# Patient Record
Sex: Female | Born: 1950 | Race: Black or African American | Hispanic: No | Marital: Married | Smoking: Former smoker
Health system: Southern US, Community
[De-identification: ages and names within clinical notes are randomized; demographics above are authoritative.]

## PROBLEM LIST (undated history)

## (undated) DIAGNOSIS — E7800 Pure hypercholesterolemia, unspecified: Secondary | ICD-10-CM

## (undated) DIAGNOSIS — K219 Gastro-esophageal reflux disease without esophagitis: Secondary | ICD-10-CM

## (undated) DIAGNOSIS — F32A Depression, unspecified: Secondary | ICD-10-CM

## (undated) DIAGNOSIS — I639 Cerebral infarction, unspecified: Secondary | ICD-10-CM

## (undated) DIAGNOSIS — D3501 Benign neoplasm of right adrenal gland: Secondary | ICD-10-CM

## (undated) DIAGNOSIS — M8700 Idiopathic aseptic necrosis of unspecified bone: Secondary | ICD-10-CM

## (undated) DIAGNOSIS — M1990 Unspecified osteoarthritis, unspecified site: Secondary | ICD-10-CM

## (undated) DIAGNOSIS — Z8669 Personal history of other diseases of the nervous system and sense organs: Secondary | ICD-10-CM

## (undated) DIAGNOSIS — G43909 Migraine, unspecified, not intractable, without status migrainosus: Secondary | ICD-10-CM

## (undated) DIAGNOSIS — F0390 Unspecified dementia without behavioral disturbance: Secondary | ICD-10-CM

## (undated) DIAGNOSIS — I10 Essential (primary) hypertension: Secondary | ICD-10-CM

## (undated) DIAGNOSIS — H43393 Other vitreous opacities, bilateral: Secondary | ICD-10-CM

## (undated) DIAGNOSIS — F329 Major depressive disorder, single episode, unspecified: Secondary | ICD-10-CM

## (undated) DIAGNOSIS — F419 Anxiety disorder, unspecified: Secondary | ICD-10-CM

## (undated) DIAGNOSIS — R569 Unspecified convulsions: Secondary | ICD-10-CM

## (undated) HISTORY — PX: COLONOSCOPY: SHX174

## (undated) HISTORY — PX: ROTATOR CUFF REPAIR: SHX139

## (undated) HISTORY — PX: TONSILLECTOMY: SUR1361

## (undated) HISTORY — PX: PARTIAL HYSTERECTOMY: SHX80

---
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
## 1998-11-11 ENCOUNTER — Encounter: Payer: Self-pay | Admitting: Neurosurgery

## 1998-11-11 ENCOUNTER — Ambulatory Visit (HOSPITAL_COMMUNITY): Admission: RE | Admit: 1998-11-11 | Discharge: 1998-11-11 | Payer: Self-pay | Admitting: Neurosurgery

## 1999-05-05 ENCOUNTER — Other Ambulatory Visit: Admission: RE | Admit: 1999-05-05 | Discharge: 1999-05-05 | Payer: Self-pay | Admitting: Obstetrics and Gynecology

## 1999-05-21 ENCOUNTER — Encounter: Payer: Self-pay | Admitting: Neurosurgery

## 1999-05-21 ENCOUNTER — Encounter: Admission: RE | Admit: 1999-05-21 | Discharge: 1999-05-21 | Payer: Self-pay | Admitting: Neurosurgery

## 1999-06-06 ENCOUNTER — Encounter: Payer: Self-pay | Admitting: Neurosurgery

## 1999-06-06 ENCOUNTER — Ambulatory Visit (HOSPITAL_COMMUNITY): Admission: RE | Admit: 1999-06-06 | Discharge: 1999-06-06 | Payer: Self-pay | Admitting: Neurosurgery

## 1999-06-23 ENCOUNTER — Ambulatory Visit (HOSPITAL_BASED_OUTPATIENT_CLINIC_OR_DEPARTMENT_OTHER): Admission: RE | Admit: 1999-06-23 | Discharge: 1999-06-23 | Payer: Self-pay | Admitting: Orthopedic Surgery

## 2001-02-07 ENCOUNTER — Emergency Department (HOSPITAL_COMMUNITY): Admission: EM | Admit: 2001-02-07 | Discharge: 2001-02-08 | Payer: Self-pay | Admitting: Emergency Medicine

## 2001-02-07 ENCOUNTER — Encounter: Payer: Self-pay | Admitting: Emergency Medicine

## 2001-04-18 ENCOUNTER — Other Ambulatory Visit: Admission: RE | Admit: 2001-04-18 | Discharge: 2001-04-18 | Payer: Self-pay | Admitting: Obstetrics and Gynecology

## 2001-09-11 ENCOUNTER — Other Ambulatory Visit: Admission: RE | Admit: 2001-09-11 | Discharge: 2001-09-11 | Payer: Self-pay | Admitting: Diagnostic Radiology

## 2003-03-06 ENCOUNTER — Encounter: Admission: RE | Admit: 2003-03-06 | Discharge: 2003-03-06 | Payer: Self-pay | Admitting: General Surgery

## 2003-03-06 ENCOUNTER — Encounter (INDEPENDENT_AMBULATORY_CARE_PROVIDER_SITE_OTHER): Payer: Self-pay | Admitting: *Deleted

## 2003-03-24 ENCOUNTER — Encounter: Admission: RE | Admit: 2003-03-24 | Discharge: 2003-03-24 | Payer: Self-pay | Admitting: General Surgery

## 2003-03-24 ENCOUNTER — Ambulatory Visit (HOSPITAL_COMMUNITY): Admission: RE | Admit: 2003-03-24 | Discharge: 2003-03-24 | Payer: Self-pay | Admitting: General Surgery

## 2003-03-24 ENCOUNTER — Encounter (INDEPENDENT_AMBULATORY_CARE_PROVIDER_SITE_OTHER): Payer: Self-pay | Admitting: *Deleted

## 2003-03-24 ENCOUNTER — Ambulatory Visit (HOSPITAL_BASED_OUTPATIENT_CLINIC_OR_DEPARTMENT_OTHER): Admission: RE | Admit: 2003-03-24 | Discharge: 2003-03-24 | Payer: Self-pay | Admitting: General Surgery

## 2004-02-24 ENCOUNTER — Other Ambulatory Visit: Admission: RE | Admit: 2004-02-24 | Discharge: 2004-02-24 | Payer: Self-pay | Admitting: Obstetrics and Gynecology

## 2004-02-24 ENCOUNTER — Ambulatory Visit (HOSPITAL_COMMUNITY): Admission: RE | Admit: 2004-02-24 | Discharge: 2004-02-24 | Payer: Self-pay | Admitting: Family Medicine

## 2008-01-22 ENCOUNTER — Encounter: Admission: RE | Admit: 2008-01-22 | Discharge: 2008-01-22 | Payer: Self-pay | Admitting: Neurosurgery

## 2008-03-14 ENCOUNTER — Encounter: Admission: RE | Admit: 2008-03-14 | Discharge: 2008-03-14 | Payer: Self-pay | Admitting: Family Medicine

## 2010-02-13 ENCOUNTER — Encounter: Payer: Self-pay | Admitting: Family Medicine

## 2010-06-11 NOTE — Op Note (Signed)
NAME:  Judy Chang, Judy Chang                       ACCOUNT NO.:  [id number]   MEDICAL RECORD NO.:  [id number]                   PATIENT TYPE:  AMB   LOCATION:  [place]                                  FACILITY:  [facility]   PHYSICIAN:  Rudell Cobb. Annamaria Boots, M.D.                DATE OF BIRTH:  12-28-1950   DATE OF PROCEDURE:  03/24/2003  DATE OF DISCHARGE:                                 OPERATIVE REPORT   PREOPERATIVE DIAGNOSIS:  Right breast mass.   POSTOPERATIVE DIAGNOSIS:  Right breast mass.   OPERATION:  Excision of right breast mass with needle localization and  specimen mammography.   SURGEON:  Rudell Cobb. Annamaria Boots, M.D.   ANESTHESIA:  MAC.   DESCRIPTION OF PROCEDURE:  The patient was placed on the operating room  table and the right breast was prepped and draped in the usual fashion.  Using the previously-placed wire in the 12 o'clock position of the right  breast where the lesion was, a curved incision was outlined, and then the  area infiltrated with local anesthetic.  An incision was made, and we  exposed the wire and then excised the wire and the surrounding tissue.  The  nodule was actually palpable there, and a specimen mammography confirmed the  removal of it.  Hemostasis was obtained with the cautery.  The subcuticular  closure of #4-0 Monocryl and Steri-Strips was carried out.  A dressing was  applied.  The patient was transferred to the recovery room in satisfactory condition,  having tolerated the procedure well.                                               Rudell Cobb. Annamaria Boots, M.D.    PRY/MEDQ  D:  03/24/2003  T:  03/24/2003  Job:  [id number]

## 2010-06-11 NOTE — Op Note (Signed)
. [facility]  Patient:    Judy Chang, Judy Chang                    MRN: [id number] Proc. Date: 06/23/99 Adm. Date:  [id number] Disc. 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  PROCEDURE: 1. Arthroscopic acromioplasty with rotator cuff debridement. 2. Intra-articular debridement of torn labrum. 3. Arthroscopic excision of distal clavicle.  SURGEON:  Josephine Cables., M.D.  ANESTHESIA:  General.  INDICATIONS:  This is a 60 year old with a MRI suggestion of severe partial versus complete right rotator cuff with impingement and AC arthritis felt amenable to outpatient arthroscopy and possible overnight observation.  DESCRIPTION OF PROCEDURE:  Normal range of motion.  No instability. Arthroscope through a posterolateral anterior portal.  Intra-articular inspection showed the rotator cuff to have about a 30-40% tear over about 2 cm, incomplete, requiring debridement.  Degenerative tearing of the labrum which was debrided and with no instability, and some mild softening of the articular surfaces, but no frank arthritic change.  Debridement was carried out intra-articularly followed by subacromial decompression with bursectomy carried out through a posterior approach.  Moderate impingement both from a degenerative AC joint as well as from a prominent acromion.  Probably 8-9 mm of distal clavicle was resected, and probably from 6-7 cm along the anterior leading edge of the acromion.  Superior surface of the cuff showed no distinct tearing.  The shoulder was drained free of fluid.  Portals and  joint infiltrated with Marcaine with closure with interrupted nylon.  A lightly compressive sterile dressing applied.  Taken to the recovery room in stable condition. DD:  06/23/99 TD:  06/27/99 Job: [id number]

## 2011-01-03 ENCOUNTER — Other Ambulatory Visit: Payer: Self-pay | Admitting: Orthopedic Surgery

## 2011-01-03 DIAGNOSIS — M545 Low back pain, unspecified: Secondary | ICD-10-CM

## 2011-01-10 ENCOUNTER — Ambulatory Visit
Admission: RE | Admit: 2011-01-10 | Discharge: 2011-01-10 | Disposition: A | Discharge status: Home / Self Care | Payer: 59 | Source: Ambulatory Visit | Attending: Orthopedic Surgery | Admitting: Orthopedic Surgery

## 2011-01-10 DIAGNOSIS — M5136 Other intervertebral disc degeneration, lumbar region: Secondary | ICD-10-CM

## 2011-01-10 DIAGNOSIS — M545 Low back pain: Secondary | ICD-10-CM

## 2011-01-10 DIAGNOSIS — M47816 Spondylosis without myelopathy or radiculopathy, lumbar region: Secondary | ICD-10-CM

## 2011-01-10 DIAGNOSIS — M51369 Other intervertebral disc degeneration, lumbar region without mention of lumbar back pain or lower extremity pain: Secondary | ICD-10-CM

## 2011-01-10 HISTORY — DX: Spondylosis without myelopathy or radiculopathy, lumbar region: M47.816

## 2011-01-10 HISTORY — DX: Other intervertebral disc degeneration, lumbar region: M51.36

## 2011-01-10 HISTORY — DX: Other intervertebral disc degeneration, lumbar region without mention of lumbar back pain or lower extremity pain: M51.369

## 2013-10-01 DIAGNOSIS — M48061 Spinal stenosis, lumbar region without neurogenic claudication: Secondary | ICD-10-CM | POA: Diagnosis not present

## 2013-11-05 DIAGNOSIS — M5440 Lumbago with sciatica, unspecified side: Secondary | ICD-10-CM | POA: Diagnosis not present

## 2013-11-11 DIAGNOSIS — F329 Major depressive disorder, single episode, unspecified: Secondary | ICD-10-CM | POA: Diagnosis not present

## 2013-11-11 DIAGNOSIS — N183 Chronic kidney disease, stage 3 (moderate): Secondary | ICD-10-CM | POA: Diagnosis not present

## 2013-11-11 DIAGNOSIS — M542 Cervicalgia: Secondary | ICD-10-CM | POA: Diagnosis not present

## 2013-11-11 DIAGNOSIS — Z23 Encounter for immunization: Secondary | ICD-10-CM | POA: Diagnosis not present

## 2013-11-11 DIAGNOSIS — G5710 Meralgia paresthetica, unspecified lower limb: Secondary | ICD-10-CM | POA: Diagnosis not present

## 2013-11-11 DIAGNOSIS — M5126 Other intervertebral disc displacement, lumbar region: Secondary | ICD-10-CM | POA: Diagnosis not present

## 2013-11-11 DIAGNOSIS — R51 Headache: Secondary | ICD-10-CM | POA: Diagnosis not present

## 2013-11-11 DIAGNOSIS — I10 Essential (primary) hypertension: Secondary | ICD-10-CM | POA: Diagnosis not present

## 2013-11-11 DIAGNOSIS — G4700 Insomnia, unspecified: Secondary | ICD-10-CM | POA: Diagnosis not present

## 2013-11-18 DIAGNOSIS — Z1231 Encounter for screening mammogram for malignant neoplasm of breast: Secondary | ICD-10-CM | POA: Diagnosis not present

## 2013-11-25 DIAGNOSIS — M5440 Lumbago with sciatica, unspecified side: Secondary | ICD-10-CM | POA: Diagnosis not present

## 2013-11-25 DIAGNOSIS — M545 Low back pain: Secondary | ICD-10-CM | POA: Diagnosis not present

## 2014-05-13 DIAGNOSIS — F329 Major depressive disorder, single episode, unspecified: Secondary | ICD-10-CM | POA: Diagnosis not present

## 2014-05-13 DIAGNOSIS — M5126 Other intervertebral disc displacement, lumbar region: Secondary | ICD-10-CM | POA: Diagnosis not present

## 2014-05-13 DIAGNOSIS — H10403 Unspecified chronic conjunctivitis, bilateral: Secondary | ICD-10-CM | POA: Diagnosis not present

## 2014-05-13 DIAGNOSIS — N183 Chronic kidney disease, stage 3 (moderate): Secondary | ICD-10-CM | POA: Diagnosis not present

## 2014-05-13 DIAGNOSIS — G4700 Insomnia, unspecified: Secondary | ICD-10-CM | POA: Diagnosis not present

## 2014-05-13 DIAGNOSIS — I10 Essential (primary) hypertension: Secondary | ICD-10-CM | POA: Diagnosis not present

## 2014-06-02 DIAGNOSIS — R531 Weakness: Secondary | ICD-10-CM | POA: Diagnosis not present

## 2014-11-25 ENCOUNTER — Emergency Department (HOSPITAL_COMMUNITY)
Admission: EM | Admit: 2014-11-25 | Discharge: 2014-11-25 | Disposition: A | Discharge status: Home / Self Care | Payer: Medicare Other | Attending: Emergency Medicine | Admitting: Emergency Medicine

## 2014-11-25 ENCOUNTER — Emergency Department (HOSPITAL_COMMUNITY): Payer: Medicare Other

## 2014-11-25 ENCOUNTER — Encounter (HOSPITAL_COMMUNITY): Payer: Self-pay | Admitting: Emergency Medicine

## 2014-11-25 DIAGNOSIS — Z79899 Other long term (current) drug therapy: Secondary | ICD-10-CM | POA: Diagnosis not present

## 2014-11-25 DIAGNOSIS — R531 Weakness: Secondary | ICD-10-CM | POA: Insufficient documentation

## 2014-11-25 DIAGNOSIS — Z8639 Personal history of other endocrine, nutritional and metabolic disease: Secondary | ICD-10-CM | POA: Diagnosis not present

## 2014-11-25 DIAGNOSIS — R112 Nausea with vomiting, unspecified: Secondary | ICD-10-CM | POA: Insufficient documentation

## 2014-11-25 DIAGNOSIS — Z720 Tobacco use: Secondary | ICD-10-CM | POA: Insufficient documentation

## 2014-11-25 DIAGNOSIS — R040 Epistaxis: Secondary | ICD-10-CM | POA: Insufficient documentation

## 2014-11-25 HISTORY — DX: Pure hypercholesterolemia, unspecified: E78.00

## 2014-11-25 LAB — BASIC METABOLIC PANEL
Anion gap: 10 (ref 5–15)
BUN: 18 mg/dL (ref 6–20)
CALCIUM: 10.5 mg/dL — AB (ref 8.9–10.3)
CO2: 22 mmol/L (ref 22–32)
CREATININE: 1.54 mg/dL — AB (ref 0.44–1.00)
Chloride: 95 mmol/L — ABNORMAL LOW (ref 101–111)
GFR calc Af Amer: 39 mL/min — ABNORMAL LOW (ref >60)
GFR, EST NON AFRICAN AMERICAN: 34 mL/min — AB (ref >60)
GLUCOSE: 93 mg/dL (ref 65–99)
POTASSIUM: 4.9 mmol/L (ref 3.5–5.1)
SODIUM: 127 mmol/L — AB (ref 135–145)

## 2014-11-25 LAB — TROPONIN I: Troponin I: <0.03 ng/mL (ref <0.031)

## 2014-11-25 LAB — CBC WITH DIFFERENTIAL/PLATELET
BASOS PCT: 1 %
Basophils Absolute: 0.1 10*3/uL (ref 0.0–0.1)
Eosinophils Absolute: 0.1 10*3/uL (ref 0.0–0.7)
Eosinophils Relative: 2 %
HEMATOCRIT: 44.7 % (ref 36.0–46.0)
HEMOGLOBIN: 15.3 g/dL — AB (ref 12.0–15.0)
LYMPHS ABS: 2.1 10*3/uL (ref 0.7–4.0)
Lymphocytes Relative: 26 %
MCH: 29.4 pg (ref 26.0–34.0)
MCHC: 34.2 g/dL (ref 30.0–36.0)
MCV: 86 fL (ref 78.0–100.0)
MONOS PCT: 10 %
Monocytes Absolute: 0.8 10*3/uL (ref 0.1–1.0)
NEUTROS ABS: 4.8 10*3/uL (ref 1.7–7.7)
NEUTROS PCT: 61 %
PLATELETS: 308 10*3/uL (ref 150–400)
RBC: 5.2 MIL/uL — ABNORMAL HIGH (ref 3.87–5.11)
RDW: 14.9 % (ref 11.5–15.5)
WBC: 7.9 10*3/uL (ref 4.0–10.5)

## 2014-11-25 LAB — HEPATIC FUNCTION PANEL
ALK PHOS: 62 U/L (ref 38–126)
ALT: 18 U/L (ref 14–54)
AST: 34 U/L (ref 15–41)
Albumin: 4.7 g/dL (ref 3.5–5.0)
BILIRUBIN DIRECT: 0.3 mg/dL (ref 0.1–0.5)
BILIRUBIN INDIRECT: 0.6 mg/dL (ref 0.3–0.9)
BILIRUBIN TOTAL: 0.9 mg/dL (ref 0.3–1.2)
TOTAL PROTEIN: 8.7 g/dL — AB (ref 6.5–8.1)

## 2014-11-25 LAB — URINALYSIS, ROUTINE W REFLEX MICROSCOPIC
BILIRUBIN URINE: NEGATIVE
GLUCOSE, UA: NEGATIVE mg/dL
HGB URINE DIPSTICK: NEGATIVE
KETONES UR: NEGATIVE mg/dL
LEUKOCYTES UA: NEGATIVE
Nitrite: NEGATIVE
PH: 5.5 (ref 5.0–8.0)
PROTEIN: NEGATIVE mg/dL
Specific Gravity, Urine: 1.005 (ref 1.005–1.030)
Urobilinogen, UA: 0.2 mg/dL (ref 0.0–1.0)

## 2014-11-25 LAB — LIPASE, BLOOD: LIPASE: 59 U/L — AB (ref 11–51)

## 2014-11-25 LAB — D-DIMER, QUANTITATIVE: D-Dimer, Quant: 0.57 ug/mL-FEU — ABNORMAL HIGH (ref 0.00–0.48)

## 2014-11-25 MED ORDER — SODIUM CHLORIDE 0.9 % IV BOLUS (SEPSIS)
1000.0000 mL | Freq: Once | INTRAVENOUS | Status: AC
  Administered 2014-11-25: 1000 mL via INTRAVENOUS

## 2014-11-25 NOTE — Progress Notes (Signed)
Patient confirms pcp is Dr. Azalia Bilis of [facility].

## 2014-11-25 NOTE — Discharge Instructions (Signed)
1. Medications: usual home medications 2. Treatment: rest, drink plenty of fluids 3. Follow Up: please followup with your primary doctor in 2-3 days for discussion of your diagnoses and further evaluation after today's visit; if you do not have a primary care doctor use the resource guide provided to find one; please return to the ER for chest pain, shortness of breath, persistent vomiting, recurring nosebleed, new or worsening symptoms   Nausea and Vomiting Nausea is a sick feeling that often comes before throwing up (vomiting). Vomiting is a reflex where stomach contents come out of your mouth. Vomiting can cause severe loss of body fluids (dehydration). Children and elderly adults can become dehydrated quickly, especially if they also have diarrhea. Nausea and vomiting are symptoms of a condition or disease. It is important to find the cause of your symptoms. CAUSES   Direct irritation of the stomach lining. This irritation can result from increased acid production (gastroesophageal reflux disease), infection, food poisoning, taking certain medicines (such as nonsteroidal anti-inflammatory drugs), alcohol use, or tobacco use.  Signals from the brain.These signals could be caused by a headache, heat exposure, an inner ear disturbance, increased pressure in the brain from injury, infection, a tumor, or a concussion, pain, emotional stimulus, or metabolic problems.  An obstruction in the gastrointestinal tract (bowel obstruction).  Illnesses such as diabetes, hepatitis, gallbladder problems, appendicitis, kidney problems, cancer, sepsis, atypical symptoms of a heart attack, or eating disorders.  Medical treatments such as chemotherapy and radiation.  Receiving medicine that makes you sleep (general anesthetic) during surgery. DIAGNOSIS Your caregiver may ask for tests to be done if the problems do not improve after a few days. Tests may also be done if symptoms are severe or if the reason for  the nausea and vomiting is not clear. Tests may include:  Urine tests.  Blood tests.  Stool tests.  Cultures (to look for evidence of infection).  X-rays or other imaging studies. Test results can help your caregiver make decisions about treatment or the need for additional tests. TREATMENT You need to stay well hydrated. Drink frequently but in small amounts.You may wish to drink water, sports drinks, clear broth, or eat frozen ice pops or gelatin dessert to help stay hydrated.When you eat, eating slowly may help prevent nausea.There are also some antinausea medicines that may help prevent nausea. HOME CARE INSTRUCTIONS   Take all medicine as directed by your caregiver.  If you do not have an appetite, do not force yourself to eat. However, you must continue to drink fluids.  If you have an appetite, eat a normal diet unless your caregiver tells you differently.  Eat a variety of complex carbohydrates (rice, wheat, potatoes, bread), lean meats, yogurt, fruits, and vegetables.  Avoid high-fat foods because they are more difficult to digest.  Drink enough water and fluids to keep your urine clear or pale yellow.  If you are dehydrated, ask your caregiver for specific rehydration instructions. Signs of dehydration may include:  Severe thirst.  Dry lips and mouth.  Dizziness.  Dark urine.  Decreasing urine frequency and amount.  Confusion.  Rapid breathing or pulse. SEEK IMMEDIATE MEDICAL CARE IF:   You have blood or brown flecks (like coffee grounds) in your vomit.  You have black or bloody stools.  You have a severe headache or stiff neck.  You are confused.  You have severe abdominal pain.  You have chest pain or trouble breathing.  You do not urinate at least once every 8  hours.  You develop cold or clammy skin.  You continue to vomit for longer than 24 to 48 hours.  You have a fever. MAKE SURE YOU:   Understand these instructions.  Will watch  your condition.  Will get help right away if you are not doing well or get worse.   This information is not intended to replace advice given to you by your health care provider. Make sure you discuss any questions you have with your health care provider.   Document Released: 01/10/2005 Document Revised: 04/04/2011 Document Reviewed: 06/09/2010 Elsevier Interactive Patient Education 2016 [place] are common. They are due to a crack in the inside lining of your nose (mucous membrane) or from a small blood vessel that starts to bleed. Nosebleeds can be caused by many conditions, such as injury, infections, dry mucous membranes or dry climate, medicines, nose picking, and home heating and cooling systems. Most nosebleeds come from blood vessels in the front of your nose. HOME CARE INSTRUCTIONS   Try controlling your nosebleed by pinching your nostrils gently and continuously for at least 10 minutes.  Avoid blowing or sniffing your nose for a number of hours after having a nosebleed.  Do not put gauze inside your nose yourself. If your nose was packed by your health care provider, try to maintain the pack inside of your nose until your health care provider removes it.  If a gauze pack was used and it starts to fall out, gently replace it or cut off the end of it.  If a balloon catheter was used to pack your nose, do not cut or remove it unless your health care provider has instructed you to do that.  Avoid lying down while you are having a nosebleed. Sit up and lean forward.  Use a nasal spray decongestant to help with a nosebleed as directed by your health care provider.  Do not use petroleum jelly or mineral oil in your nose. These can drip into your lungs.  Maintain humidity in your home by using less air conditioning or by using a humidifier.  Aspirinand blood thinners make bleeding more likely. If you are prescribed these medicines and you suffer from  nosebleeds, ask your health care provider if you should stop taking the medicines or adjust the dose. Do not stop medicines unless directed by your health care provider  Resume your normal activities as you are able, but avoid straining, lifting, or bending at the waist for several days.  If your nosebleed was caused by dry mucous membranes, use over-the-counter saline nasal spray or gel. This will keep the mucous membranes moist and allow them to heal. If you must use a lubricant, choose the water-soluble variety. Use it only sparingly, and do not use it within several hours of lying down.  Keep all follow-up visits as directed by your health care provider. This is important. SEEK MEDICAL CARE IF:  You have a fever.  You get frequent nosebleeds.  You are getting nosebleeds more often. SEEK IMMEDIATE MEDICAL CARE IF:  Your nosebleed lasts longer than 20 minutes.  Your nosebleed occurs after an injury to your face, and your nose looks crooked or broken.  You have unusual bleeding from other parts of your body.  You have unusual bruising on other parts of your body.  You feel light-headed or you faint.  You become sweaty.  You vomit blood.  Your nosebleed occurs after a head injury.   This information is not  intended to replace advice given to you by your health care provider. Make sure you discuss any questions you have with your health care provider.   Document Released: 10/20/2004 Document Revised: 01/31/2014 Document Reviewed: 08/26/2013 Elsevier Interactive Patient Education 2016 [employer].   Emergency Department Resource Guide 1) Find a Doctor and Pay Out of Pocket Although you won't have to find out who is covered by your insurance plan, it is a good idea to ask around and get recommendations. You will then need to call the office and see if the doctor you have chosen will accept you as a new patient and what types of options they offer for patients who are self-pay.  Some doctors offer discounts or will set up payment plans for their patients who do not have insurance, but you will need to ask so you aren't surprised when you get to your appointment.  2) Contact Your Local Health Department Not all health departments have doctors that can see patients for sick visits, but many do, so it is worth a call to see if yours does. If you don't know where your local health department is, you can check in your phone book. The CDC also has a tool to help you locate your state's health department, and many state websites also have listings of all of their local health departments.  3) Find a [facility] If your illness is not likely to be very severe or complicated, you may want to try a walk in clinic. These are popping up all over the country in pharmacies, drugstores, and shopping centers. They're usually staffed by nurse practitioners or physician assistants that have been trained to treat common illnesses and complaints. They're usually fairly quick and inexpensive. However, if you have serious medical issues or chronic medical problems, these are probably not your best option.  No Primary Care Doctor: - Call Health Connect at  [phone] - they can help you locate a primary care doctor that  accepts your insurance, provides certain services, etc. - Physician Referral Service- [phone]  Chronic Pain Problems: Organization         Address  Phone   Notes  [facility]  [phone] Patients need to be referred by their primary care doctor.   Medication Assistance: Organization         Address  Phone   Notes  [facility] Medication [facility] [address] [place], [place] [address] [phone] --Must be a resident of [facility] -- Must have NO insurance coverage whatsoever (no Medicaid/ Medicare, etc.) -- The pt. MUST have a primary care doctor that directs their care regularly and follows them in the  community   MedAssist  [phone]   [employer]  [phone]    Agencies that provide inexpensive medical care: Organization         Address  Phone   Notes  [place]  [phone]   [facility]    [phone]   [facility] [place], [place] [address] [phone]   [place] [address] [place] [phone]   Planned Parenthood    [phone]   [facility]    [phone]   [place] [address], [place] Phone:  [phone], Fax:  [phone] Hours of Operation:  9 am - 6 pm, M-F.  Also accepts Medicaid/Medicare and self-pay.  [facility] for [place], [address], [place] Phone: [phone], Fax: [phone]. Hours of Operation:  8:30 am - 5:30 pm, M-F.  Also accepts Medicaid and self-pay.  [facility] [address], [place] Phone: [phone]   [place], [place], [place] [phone], Ext. [phone] Mondays & Thursdays: 7-9 AM.  First 15 patients are seen on a first come, first serve basis.    [place] Providers:  Organization         Address  Phone   Notes  [facility] [address], [place] [phone] Also accepts self-pay patients.  [facility] [id number] [place], [place]  [phone]   [place], [address], [place] [phone]   [facility] [address], [place] [phone]   Lucianne Lei [address], [place]   [phone] Only accepts [place] Access [place] patients after they have their name applied to their card.   Self-Pay (no insurance) in [facility]:  Organization         Address  Phone   Notes  Sickle Cell Patients, [facility].  [facility] [place] [phone]   [facility] [place] [phone]   [facility] [place]  [place], [place], [place] [phone]   [facility]/Dr. Osei-Bonsu  [address] [place] or [address], [place] [phone] Phone number for both [place] and [place] locations is the same.  Urgent Medical and [facility] [address], [place] [phone]   [facility] [address] [place] or [address] [phone]   [facility] [address], [place] [phone], phone; [phone], fax Sees patients 1st and 3rd Saturday of every month.  Must not qualify for public or private insurance (i.e. Medicaid, Medicare, [place] Health Choice, Veterans' Benefits)  Household income should be no more than 200% of the poverty level The clinic cannot treat you if you are pregnant or think you are pregnant  Sexually transmitted diseases are not treated at the clinic.    Dental Care: Organization         Address  Phone  Notes  [facility] Department of [facility] [place] [phone] Accepts children up to age 69 who are enrolled in [place] or [place]; pregnant women with a Medicaid card; and children who have applied for Medicaid or [place] Health Choice, but were declined, whose parents can pay a reduced fee at time of service.  [facility] Department of [facility]  [address], [place] [phone] Accepts children up to age 3 who are enrolled in [place] or [place]; pregnant women with a Medicaid card; and children who have applied for Medicaid or [place] Health Choice, but were declined, whose parents can pay a reduced fee at time of service.  [place] Adult Dental Access PROGRAM  [place] [phone] Patients are seen by appointment only. Walk-ins are not accepted. [place] will see patients 63 years of age and older. Monday - Tuesday (  8am-5pm) Most Wednesdays (8:30-5pm) $30 per visit, cash only  [facility] Adult Dental Access PROGRAM  [address], [facility] [phone] Patients are seen by appointment only. Walk-ins are not accepted. [place] will see patients 24 years of age and older. One Wednesday Evening (Monthly: Volunteer Based).  $30 per visit, cash only  [place]  [phone] for adults; Children under age 47, call Graduate Pediatric Dentistry at [phone]. Children aged 8-14, please call [phone] to request a pediatric application.  Dental services are provided in all areas of dental care including fillings, crowns and bridges, complete and partial dentures, implants, gum treatment, root canals, and extractions. Preventive care is also provided. Treatment is provided to both adults and children. Patients are selected via a lottery and there is often a waiting list.   [facility] [address] [place]  [phone] [email or web address]   [facility] [address] [place], [place] [phone], Ext. [phone] Second and Fourth Thursday of each month, opens at 6:30 AM; Clinic ends at 9 AM.  Patients are seen on a first-come first-served basis, and a limited number are seen during each clinic.   [facility]  [address] [place] Danker [place], [place] [phone]   Eligibility Requirements You must have lived in [place], [place], or [place] counties for at least the last three months.   You cannot be eligible for state or federal sponsored [employer], including [employer], [place], or [employer].   You generally cannot be eligible for healthcare insurance through your employer.    How to apply: Eligibility screenings are held every  Tuesday and Wednesday afternoon from 1:00 pm until 4:00 pm. You do not need an appointment for the interview!  [facility] [address] [place], [place]   [place]  [place] Department  Justice  [phone]    Behavioral Health Resources in the Community: Intensive Outpatient Programs Organization         Address  Phone  Notes  [place]. [address], [place], [place] [phone]   [facility] Outpatient [address] [place], [place]   ADS: Alcohol & Drug Svcs [address], [place], [place]   [place] [address],  [place], [place] or [phone]   Substance Abuse Resources Organization         Address  Phone  Notes  Alcohol and Drug Services  [phone]   [place]  [phone]   [place]   Chinita Pester  [phone]   Residential & Outpatient Substance Abuse Program  [phone]   Psychological Services Organization         Address  Phone  Notes  [facility]  [place]  [phone]   [place] [address] [place] or [phone]    Mobile Crisis Teams Organization         Address  Phone  Notes  [facility], Mobile Crisis Care Unit  [phone]   [facility]  [address] [place], [place]   [place] [address], [place] [phone]    Self-Help/Support Groups Organization         Address  Phone  Notes  [facility]ental Health Assoc. of [place] - variety of support groups  [place] Call for more information  Narcotics Anonymous (NA), [facility] [address], [employer] [place]  2 meetings at this location    Special educational needs teacher         Address  Phone  Notes  [facility]AP Residential Treatment [place],    [place]  [phone]   [facility]  [address], [place] [phone], [place] , [place]   [place], [place] [phone] Admissions: 8am-3pm M-F  Incentives Substance [place] [address],    [place], [place] [phone]   [facility] [address] [place], [place], [place]   [facility] [address]  [place], [place]   Insight Programs - Intensive Outpatient [address], [place], [place]   [facility] ([place].,  [place], [place] [phone] or [phone]   Residential Treatment Services (RTS) [address] [place], [place] Accepts Medicaid  [facility] [place] [address],  [place] [phone] Substance Abuse/Addiction Treatment   [facility] Organization         Address  Phone  Notes  [facility]  [phone]   Domenic Schwab, PhD [address] [place], [place]   [phone] or [phone]   [facility] [place], [place] [phone]   [facility] [address], [place], [place] [phone] Insurance/Medicaid/sponsorship through [facility] and Families [address] [place]                                    [place], [place] [phone] [place] [address], [place] [phone]    Dr. Adele Schilder  [phone]   [facility] Dept. 1) [address], [place] 2) [place] 3)  [place] [address], [place] [phone]  [phone]   [place] [phone] or [phone] (After  Hours)

## 2014-11-25 NOTE — ED Provider Notes (Signed)
CSN: [id number]     Arrival date & time 11/25/14  1420 History   First MD Initiated Contact with Patient 11/25/14 1541     Chief Complaint  Patient presents with  . Emesis    HPI   Judy Chang is a 64 y.o. female with a PMH of HLD who presents to the ED with emesis and nosebleed. She states this occurred around 1 PM this afternoon. She states she felt "sick on her stomach" and had one episode of emesis containing blood. She reports at the same time, she had a nosebleed that lasted approximately 60 seconds and spontaneously resolved. She denies exacerbating or alleviating factors. She states this has never happened to her before. She denies headache, dizziness, chest pain, shortness of breath, abdominal pain, diarrhea, constipation, numbness, paresthesia. She reports generalized weakness. She states she does not take anticoagulants.   Past Medical History  Diagnosis Date  . Hypercholesterolemia    History reviewed. No pertinent past surgical history. History reviewed. No pertinent family history. Social History  Substance Use Topics  . Smoking status: Current Every Day Smoker  . Smokeless tobacco: None  . Alcohol Use: Yes     Comment: every few days   OB History    No data available      Review of Systems  Constitutional: Negative for fever and chills.  HENT: Positive for nosebleeds. Negative for congestion.   Respiratory: Negative for cough and shortness of breath.   Cardiovascular: Negative for chest pain.  Gastrointestinal: Positive for nausea and vomiting. Negative for abdominal pain, diarrhea and constipation.  Neurological: Positive for weakness. Negative for dizziness and headaches.  All other systems reviewed and are negative.     Allergies  Nsaids  Home Medications   Prior to Admission medications   Medication Sig Start Date End Date Taking? Authorizing Provider  amLODipine (NORVASC) 5 MG tablet Take 5 mg by mouth daily. 11/21/14  Yes Historical  Provider, MD  buPROPion (WELLBUTRIN XL) 150 MG 24 hr tablet Take 150 mg by mouth daily. 11/12/14  Yes Historical Provider, MD  buPROPion (WELLBUTRIN XL) 300 MG 24 hr tablet Take 300 mg by mouth every morning. 10/26/14  Yes Historical Provider, MD  diclofenac sodium (VOLTAREN) 1 % GEL Apply 1 application topically daily as needed (pain).  11/23/14  Yes Historical Provider, MD  GARLIC PO Take 1 tablet by mouth 2 (two) times daily.   Yes Historical Provider, MD  lisinopril-hydrochlorothiazide (PRINZIDE,ZESTORETIC) 20-25 MG tablet Take 1 tablet by mouth daily. 11/17/14  Yes Historical Provider, MD  Oxycodone HCl 20 MG TABS Take 1 tablet by mouth 3 (three) times daily as needed (pain).  10/29/14  Yes Historical Provider, MD  traMADol (ULTRAM) 50 MG tablet Take 50 mg by mouth 2 (two) times daily as needed. 11/12/14  Yes Historical Provider, MD  traZODone (DESYREL) 50 MG tablet Take 50 mg by mouth at bedtime. 11/18/14  Yes Historical Provider, MD    BP 161/88 mmHg  Pulse 72  Temp(Src) 97.5 F (36.4 C) (Oral)  Resp 18  SpO2 100% Physical Exam  Constitutional: She is oriented to person, place, and time. She appears well-developed and well-nourished. No distress.  HENT:  Head: Normocephalic and atraumatic.  Right Ear: External ear normal.  Left Ear: External ear normal.  Mouth/Throat: Uvula is midline, oropharynx is clear and moist and mucous membranes are normal.  Small amount of dried blood to left nare, no active bleeding.  Eyes: Conjunctivae, EOM and lids are normal. Pupils  are equal, round, and reactive to light. Right eye exhibits no discharge. Left eye exhibits no discharge. No scleral icterus.  Neck: Normal range of motion. Neck supple.  Cardiovascular: Normal rate, regular rhythm, normal heart sounds, intact distal pulses and normal pulses.   Pulmonary/Chest: Effort normal and breath sounds normal. No respiratory distress. She has no wheezes. She has no rales.  Abdominal: Soft. Normal  appearance and bowel sounds are normal. She exhibits no distension and no mass. There is no tenderness. There is no rigidity, no rebound and no guarding.  Musculoskeletal: Normal range of motion. She exhibits no edema or tenderness.  Neurological: She is alert and oriented to person, place, and time. She has normal strength. No sensory deficit.  Skin: Skin is warm, dry and intact. No rash noted. She is not diaphoretic. No erythema. No pallor.  Psychiatric: She has a normal mood and affect. Her speech is normal and behavior is normal.  Nursing note and vitals reviewed.   ED Course  Procedures (including critical care time)  Labs Review Labs Reviewed  CBC WITH DIFFERENTIAL/PLATELET - Abnormal; Notable for the following:    RBC 5.20 (*)    Hemoglobin 15.3 (*)    All other components within normal limits  BASIC METABOLIC PANEL - Abnormal; Notable for the following:    Sodium 127 (*)    Chloride 95 (*)    Creatinine, Ser 1.54 (*)    Calcium 10.5 (*)    GFR calc non Af Amer 34 (*)    GFR calc Af Amer 39 (*)    All other components within normal limits  D-DIMER, QUANTITATIVE (NOT AT [facility]) - Abnormal; Notable for the following:    D-Dimer, Quant 0.57 (*)    All other components within normal limits  HEPATIC FUNCTION PANEL - Abnormal; Notable for the following:    Total Protein 8.7 (*)    All other components within normal limits  LIPASE, BLOOD - Abnormal; Notable for the following:    Lipase 59 (*)    All other components within normal limits  URINALYSIS, ROUTINE W REFLEX MICROSCOPIC (NOT AT [facility])  TROPONIN I    Imaging Review Dg Chest 2 View  11/25/2014  CLINICAL DATA:  Pain of the right breast for several weeks. History of hypertension. Smoker. EXAM: CHEST  2 VIEW COMPARISON:  None. FINDINGS: Mild hyperinflation. The heart size and mediastinal contours are within normal limits. Both lungs are clear. The visualized skeletal structures are unremarkable. IMPRESSION: No active  cardiopulmonary disease. Electronically Signed   By: Lucienne Capers M.D.   On: 11/25/2014 18:36     I have personally reviewed and evaluated these images and lab results as part of my medical decision-making.   EKG Interpretation None      MDM   Final diagnoses:  Non-intractable vomiting with nausea, vomiting of unspecified type  Epistaxis    64 year old female presents with 1 episode of emesis containing blood and epistaxis, which occurred simultaneously today around 1 PM. Denies headache, dizziness, chest pain, shortness of breath, abdominal pain, diarrhea, constipation, numbness, paresthesia. Reports generalized weakness.  Patient is afebrile. Vital signs stable. Minimal amount of dried blood to left nare, no active bleeding. Posterior oropharynx clear. Heart RRR. Lungs clear to auscultation bilaterally. Abdomen soft, non-tender, non-distended. Strength and sensation intact.  CBC negative for leukocytosis or anemia. BMP remarkable for sodium 127, creatinine 1.54.  Hepatic function panel negative for elevated transaminases. Lipase mildly elevated at 59. UA no evidence of infection.  Patient discussed with and seen by Dr. Wyvonnia Dusky.  Reports right sided chest pain and alcohol use - states she drinks 2 40s daily. Given fluids in the ED. D-dimer within normal limits for age. EKG sinus rhythm, HR 72. Troponin negative. CXR negative for active cardiopulmonary disease.  Given reassuring work-up in the ED, feel patient is stable for discharge at this time. Blood streaks in emesis likely due to concurrent epistaxis. No recurrence of symptoms in the ED. Patient to follow-up with PCP. Return precautions discussed.   BP 161/88 mmHg  Pulse 72  Temp(Src) 97.5 F (36.4 C) (Oral)  Resp 18  SpO2 100%      Marella Chimes, PA-C 11/26/14 0014  Ezequiel Essex, MD 11/26/14 1103

## 2014-11-25 NOTE — ED Notes (Signed)
Pt reports she had one episode of emesis this afternoon at 1pm which triggered a nose bleed for 3 minutes. Nosebleed resolved spontaneously before arrival. Pt also reports she coughed up blood during her nosebleed as well. Denies any abd pain. Had 1 episode of diarrhea yesterday, but none today.

## 2015-10-22 ENCOUNTER — Observation Stay (HOSPITAL_COMMUNITY)
Admission: EM | Admit: 2015-10-22 | Discharge: 2015-10-23 | Disposition: A | Discharge status: Home / Self Care | Payer: Medicare Other | Attending: Cardiology | Admitting: Cardiology

## 2015-10-22 ENCOUNTER — Emergency Department (HOSPITAL_COMMUNITY): Payer: Medicare Other

## 2015-10-22 ENCOUNTER — Encounter (HOSPITAL_COMMUNITY): Payer: Self-pay | Admitting: *Deleted

## 2015-10-22 DIAGNOSIS — Z23 Encounter for immunization: Secondary | ICD-10-CM | POA: Insufficient documentation

## 2015-10-22 DIAGNOSIS — F17200 Nicotine dependence, unspecified, uncomplicated: Secondary | ICD-10-CM | POA: Diagnosis not present

## 2015-10-22 DIAGNOSIS — I10 Essential (primary) hypertension: Secondary | ICD-10-CM | POA: Diagnosis present

## 2015-10-22 DIAGNOSIS — R0602 Shortness of breath: Secondary | ICD-10-CM | POA: Insufficient documentation

## 2015-10-22 DIAGNOSIS — Z79899 Other long term (current) drug therapy: Secondary | ICD-10-CM | POA: Diagnosis not present

## 2015-10-22 DIAGNOSIS — R0789 Other chest pain: Secondary | ICD-10-CM | POA: Diagnosis not present

## 2015-10-22 DIAGNOSIS — R079 Chest pain, unspecified: Secondary | ICD-10-CM | POA: Diagnosis not present

## 2015-10-22 DIAGNOSIS — I213 ST elevation (STEMI) myocardial infarction of unspecified site: Secondary | ICD-10-CM | POA: Diagnosis present

## 2015-10-22 DIAGNOSIS — E7800 Pure hypercholesterolemia, unspecified: Secondary | ICD-10-CM | POA: Diagnosis not present

## 2015-10-22 DIAGNOSIS — D3501 Benign neoplasm of right adrenal gland: Secondary | ICD-10-CM | POA: Diagnosis present

## 2015-10-22 HISTORY — DX: Essential (primary) hypertension: I10

## 2015-10-22 HISTORY — DX: Benign neoplasm of right adrenal gland: D35.01

## 2015-10-22 HISTORY — DX: Unspecified osteoarthritis, unspecified site: M19.90

## 2015-10-22 LAB — LIPID PANEL
Cholesterol: 208 mg/dL — ABNORMAL HIGH (ref 0–200)
HDL: 102 mg/dL (ref >40)
LDL CALC: 93 mg/dL (ref 0–99)
TRIGLYCERIDES: 67 mg/dL (ref <150)
Total CHOL/HDL Ratio: 2 RATIO
VLDL: 13 mg/dL (ref 0–40)

## 2015-10-22 LAB — DIFFERENTIAL
BASOS ABS: 0 10*3/uL (ref 0.0–0.1)
Basophils Relative: 0 %
EOS PCT: 0 %
Eosinophils Absolute: 0 10*3/uL (ref 0.0–0.7)
Lymphocytes Relative: 14 %
Lymphs Abs: 2.3 10*3/uL (ref 0.7–4.0)
MONO ABS: 1.7 10*3/uL — AB (ref 0.1–1.0)
MONOS PCT: 10 %
NEUTROS PCT: 76 %
Neutro Abs: 12.5 10*3/uL — ABNORMAL HIGH (ref 1.7–7.7)

## 2015-10-22 LAB — COMPREHENSIVE METABOLIC PANEL
ALBUMIN: 3.8 g/dL (ref 3.5–5.0)
ALT: 12 U/L — ABNORMAL LOW (ref 14–54)
ANION GAP: 8 (ref 5–15)
AST: 19 U/L (ref 15–41)
Alkaline Phosphatase: 54 U/L (ref 38–126)
BILIRUBIN TOTAL: 0.3 mg/dL (ref 0.3–1.2)
BUN: 15 mg/dL (ref 6–20)
CHLORIDE: 102 mmol/L (ref 101–111)
CO2: 26 mmol/L (ref 22–32)
Calcium: 10.3 mg/dL (ref 8.9–10.3)
Creatinine, Ser: 1.2 mg/dL — ABNORMAL HIGH (ref 0.44–1.00)
GFR calc Af Amer: 53 mL/min — ABNORMAL LOW (ref >60)
GFR, EST NON AFRICAN AMERICAN: 46 mL/min — AB (ref >60)
GLUCOSE: 132 mg/dL — AB (ref 65–99)
POTASSIUM: 4.2 mmol/L (ref 3.5–5.1)
Sodium: 136 mmol/L (ref 135–145)
Total Protein: 7.1 g/dL (ref 6.5–8.1)

## 2015-10-22 LAB — CBC
HEMATOCRIT: 39.4 % (ref 36.0–46.0)
HEMOGLOBIN: 13.2 g/dL (ref 12.0–15.0)
MCH: 29.2 pg (ref 26.0–34.0)
MCHC: 33.5 g/dL (ref 30.0–36.0)
MCV: 87.2 fL (ref 78.0–100.0)
Platelets: 289 10*3/uL (ref 150–400)
RBC: 4.52 MIL/uL (ref 3.87–5.11)
RDW: 16.1 % — ABNORMAL HIGH (ref 11.5–15.5)
WBC: 16.5 10*3/uL — AB (ref 4.0–10.5)

## 2015-10-22 LAB — PROTIME-INR
INR: 0.89
PROTHROMBIN TIME: 12 s (ref 11.4–15.2)

## 2015-10-22 LAB — TROPONIN I: Troponin I: <0.03 ng/mL (ref <0.03)

## 2015-10-22 LAB — I-STAT TROPONIN, ED: Troponin i, poc: 0.01 ng/mL (ref 0.00–0.08)

## 2015-10-22 LAB — APTT: APTT: 22 s — AB (ref 24–36)

## 2015-10-22 MED ORDER — TRAZODONE HCL 50 MG PO TABS
50.0000 mg | ORAL_TABLET | Freq: Every day | ORAL | Status: DC
  Administered 2015-10-23: 50 mg via ORAL
  Filled 2015-10-22: qty 1

## 2015-10-22 MED ORDER — MORPHINE SULFATE (PF) 2 MG/ML IV SOLN
INTRAVENOUS | Status: AC
  Filled 2015-10-22: qty 1

## 2015-10-22 MED ORDER — NITROGLYCERIN 0.4 MG SL SUBL
SUBLINGUAL_TABLET | SUBLINGUAL | Status: AC
Start: 2015-10-22 — End: 2015-10-22
  Administered 2015-10-22: 0.4 mg via SUBLINGUAL
  Filled 2015-10-22: qty 3

## 2015-10-22 MED ORDER — HEPARIN SODIUM (PORCINE) 5000 UNIT/ML IJ SOLN: 4000.0000 [IU] | Freq: Once | INTRAMUSCULAR | Status: DC

## 2015-10-22 MED ORDER — OXYCODONE HCL 5 MG PO TABS: 20.0000 mg | ORAL_TABLET | Freq: Three times a day (TID) | ORAL | Status: DC | PRN

## 2015-10-22 MED ORDER — NITROGLYCERIN 0.4 MG SL SUBL
0.4000 mg | SUBLINGUAL_TABLET | SUBLINGUAL | Status: DC | PRN
  Administered 2015-10-22 (×3): 0.4 mg via SUBLINGUAL

## 2015-10-22 MED ORDER — BUPROPION HCL ER (XL) 150 MG PO TB24
450.0000 mg | ORAL_TABLET | Freq: Every day | ORAL | Status: DC
  Administered 2015-10-23: 450 mg via ORAL
  Filled 2015-10-22: qty 3

## 2015-10-22 MED ORDER — SODIUM CHLORIDE 0.9% FLUSH
3.0000 mL | Freq: Two times a day (BID) | INTRAVENOUS | Status: DC
  Administered 2015-10-23 (×2): 3 mL via INTRAVENOUS

## 2015-10-22 MED ORDER — MORPHINE SULFATE (PF) 4 MG/ML IV SOLN
4.0000 mg | Freq: Once | INTRAVENOUS | Status: AC
  Administered 2015-10-22: 2 mg via INTRAVENOUS

## 2015-10-22 MED ORDER — ONDANSETRON HCL 4 MG/2ML IJ SOLN
INTRAMUSCULAR | Status: AC
  Filled 2015-10-22: qty 2

## 2015-10-22 MED ORDER — MORPHINE SULFATE (PF) 4 MG/ML IV SOLN
4.0000 mg | Freq: Once | INTRAVENOUS | Status: AC
  Administered 2015-10-22: 4 mg via INTRAVENOUS
  Filled 2015-10-22: qty 1

## 2015-10-22 MED ORDER — FLUOXETINE HCL 20 MG PO CAPS
20.0000 mg | ORAL_CAPSULE | Freq: Every day | ORAL | Status: DC
  Administered 2015-10-23: 20 mg via ORAL
  Filled 2015-10-22: qty 1

## 2015-10-22 MED ORDER — ONDANSETRON HCL 4 MG/2ML IJ SOLN
4.0000 mg | Freq: Once | INTRAMUSCULAR | Status: AC
  Administered 2015-10-22: 4 mg via INTRAVENOUS

## 2015-10-22 MED ORDER — IOPAMIDOL (ISOVUE-370) INJECTION 76%
INTRAVENOUS | Status: AC
  Administered 2015-10-22: 100 mL
  Filled 2015-10-22: qty 100

## 2015-10-22 MED ORDER — IOPAMIDOL (ISOVUE-370) INJECTION 76%
INTRAVENOUS | Status: AC
  Filled 2015-10-22: qty 100

## 2015-10-22 MED ORDER — AMLODIPINE BESYLATE 5 MG PO TABS
5.0000 mg | ORAL_TABLET | Freq: Every day | ORAL | Status: DC
  Administered 2015-10-23: 5 mg via ORAL
  Filled 2015-10-22: qty 1

## 2015-10-22 MED ORDER — HEPARIN SODIUM (PORCINE) 5000 UNIT/ML IJ SOLN: 60.0000 [IU]/kg | Freq: Once | INTRAMUSCULAR | Status: DC

## 2015-10-22 MED ORDER — HEPARIN SODIUM (PORCINE) 5000 UNIT/ML IJ SOLN
3500.0000 [IU] | Freq: Once | INTRAMUSCULAR | Status: AC
  Administered 2015-10-22: 3500 [IU] via INTRAVENOUS
  Filled 2015-10-22: qty 1

## 2015-10-22 MED ORDER — LISINOPRIL-HYDROCHLOROTHIAZIDE 20-25 MG PO TABS: 1.0000 | ORAL_TABLET | Freq: Every day | ORAL | Status: DC

## 2015-10-22 MED ORDER — TRAMADOL HCL 50 MG PO TABS
50.0000 mg | ORAL_TABLET | Freq: Two times a day (BID) | ORAL | Status: DC | PRN
  Administered 2015-10-23: 50 mg via ORAL
  Filled 2015-10-22: qty 1

## 2015-10-22 NOTE — ED Notes (Signed)
Pt taken to CT.

## 2015-10-22 NOTE — ED Triage Notes (Signed)
Pt by [facility] for code STEMI. Pt reports having an episode of CP this morning that resolved on its own. Pt was eating dinner had another episode of chest pain onset at 1900. Pt took 500mg  ASA and given two nitro by EMS with mild improvement. Lung sounds clear bilaterally. Cardiology at bedside

## 2015-10-22 NOTE — ED Notes (Signed)
Given water per Dr. Freda Munro and Dr. Marlowe Sax.

## 2015-10-22 NOTE — H&P (Signed)
C. c chest pain, code STEMI HPI:  65 y/o african [place] woman with pmh of hlp, htn, DJD brought in by EMS as a code STEMI. She started to have pain this morning intermittently but became worse at night so called EMS. Her intial ekg was concerning for STEMI so code stemi was called. No prior CAD or similar episodes.  C/o epigastric pain, also has nausea associated with it.  In ED she was given ntg with minimal relief and morphine resolved the pain. The pain is also worse with deep breathing. cta performed to r/o p.e  EKG does not meet STEMI criteria but ST in lateral leads are not completely normal.  Review of Systems:     Cardiac Review of Systems: {Y] = yes [ ]  = no  Chest Pain [   y ]  Resting SOB [   ] Exertional SOB  [  ]  Orthopnea [  ]   Pedal Edema [   ]    Palpitations [  ] Syncope  [  ]   Presyncope [   ]  General Review of Systems: [Y] = yes [  ]=no Constitional: recent weight change [  ]; anorexia [  ]; fatigue [  ]; nausea Blue.Reese  ]; night sweats [  ]; fever [  ]; or chills [  ];                                                                     Dental: poor dentition[  ];   Eye : blurred vision [  ]; diplopia [   ]; vision changes [  ];  Amaurosis fugax[  ]; Resp: cough [  ];  wheezing[  ];  hemoptysis[  ]; shortness of breath[  ]; paroxysmal nocturnal dyspnea[  ]; dyspnea on exertion[  ]; or orthopnea[  ];  GI:  gallstones[  ], vomiting[y  ];  dysphagia[  ]; melena[  ];  hematochezia [  ]; heartburn[  ];   GU: kidney stones [  ]; hematuria[  ];   dysuria [  ];  nocturia[  ];               Skin: rash [  ], swelling[  ];, hair loss[  ];  peripheral edema[  ];  or itching[  ]; Musculosketetal: myalgias[  ];  joint swelling[  ];  joint erythema[  ];  joint pain[  ];  back pain[  ];  Heme/Lymph: bruising[  ];  bleeding[  ];  anemia[  ];  Neuro: TIA[  ];  headaches[  ];  stroke[  ];  vertigo[  ];  seizures[  ];   paresthesias[  ];  difficulty walking[  ];  Psych:depression[  ];  anxiety[  ];  Endocrine: diabetes[  ];  thyroid dysfunction[  ];  Other:  Past Medical History:  Diagnosis Date  . Arthritis   . DJD (degenerative joint disease)   . Hypercholesterolemia   . Hypertension     No current facility-administered medications on file prior to encounter.    Current Outpatient Prescriptions on File Prior to Encounter  Medication Sig Dispense Refill  . amLODipine (NORVASC) 5 MG tablet Take 5 mg by mouth daily.  5  .  buPROPion (WELLBUTRIN XL) 150 MG 24 hr tablet Take 450 mg by mouth daily.   6  . diclofenac sodium (VOLTAREN) 1 % GEL Apply 1 application topically daily as needed (pain).     Marland Kitchen GARLIC PO Take 1 tablet by mouth 2 (two) times daily.    Marland Kitchen lisinopril-hydrochlorothiazide (PRINZIDE,ZESTORETIC) 20-25 MG tablet Take 1 tablet by mouth daily.  2  . Oxycodone HCl 20 MG TABS Take 1 tablet by mouth 3 (three) times daily as needed (pain).   0  . traMADol (ULTRAM) 50 MG tablet Take 50 mg by mouth 2 (two) times daily as needed for moderate pain.   2  . traZODone (DESYREL) 50 MG tablet Take 50 mg by mouth at bedtime.  6     Allergies  Allergen Reactions  . Nsaids Rash    Social History   Social History  . Marital status: Married    Spouse name: N/A  . Number of children: N/A  . Years of education: N/A   Occupational History  . Not on file.   Social History Main Topics  . Smoking status: Current Every Day Smoker  . Smokeless tobacco: Never Used  . Alcohol use Yes     Comment: every few days  . Drug use:     Types: Marijuana  . Sexual activity: Not on file   Other Topics Concern  . Not on file   Social History Narrative  . No narrative on file    No family history on file.  PHYSICAL EXAM: Vitals:   10/22/15 2245 10/22/15 2330  BP: 116/70 116/62  Pulse: 72 74  Resp: 14 12  Temp:     General:  Well appearing. No respiratory difficulty HEENT: normal Neck: supple. no JVD. Carotids 2+ bilat; no bruits. No lymphadenopathy or  thryomegaly appreciated. Cor: PMI nondisplaced. Regular rate & rhythm. No rubs, gallops or murmurs. Lungs: clear Abdomen: soft, nontender, nondistended. No hepatosplenomegaly. No bruits or masses. Good bowel sounds. Extremities: no cyanosis, clubbing, rash, edema Neuro: alert & oriented x 3, cranial nerves grossly intact. moves all 4 extremities w/o difficulty. Affect pleasant.  ECG:  Results for orders placed or performed during the hospital encounter of 10/22/15 (from the past 24 hour(s))  CBC     Status: Abnormal   Collection Time: 10/22/15  8:52 PM  Result Value Ref Range   WBC 16.5 (H) 4.0 - 10.5 K/uL   RBC 4.52 3.87 - 5.11 MIL/uL   Hemoglobin 13.2 12.0 - 15.0 g/dL   HCT 39.4 36.0 - 46.0 %   MCV 87.2 78.0 - 100.0 fL   MCH 29.2 26.0 - 34.0 pg   MCHC 33.5 30.0 - 36.0 g/dL   RDW 16.1 (H) 11.5 - 15.5 %   Platelets 289 150 - 400 K/uL  Differential     Status: Abnormal   Collection Time: 10/22/15  8:52 PM  Result Value Ref Range   Neutrophils Relative % 76 %   Lymphocytes Relative 14 %   Monocytes Relative 10 %   Eosinophils Relative 0 %   Basophils Relative 0 %   Neutro Abs 12.5 (H) 1.7 - 7.7 K/uL   Lymphs Abs 2.3 0.7 - 4.0 K/uL   Monocytes Absolute 1.7 (H) 0.1 - 1.0 K/uL   Eosinophils Absolute 0.0 0.0 - 0.7 K/uL   Basophils Absolute 0.0 0.0 - 0.1 K/uL   Smear Review MORPHOLOGY UNREMARKABLE   Protime-INR     Status: None   Collection Time: 10/22/15  8:52 PM  Result Value Ref Range   Prothrombin Time 12.0 11.4 - 15.2 seconds   INR 0.89   APTT     Status: Abnormal   Collection Time: 10/22/15  8:52 PM  Result Value Ref Range   aPTT 22 (L) 24 - 36 seconds  Comprehensive metabolic panel     Status: Abnormal   Collection Time: 10/22/15  8:52 PM  Result Value Ref Range   Sodium 136 135 - 145 mmol/L   Potassium 4.2 3.5 - 5.1 mmol/L   Chloride 102 101 - 111 mmol/L   CO2 26 22 - 32 mmol/L   Glucose, Bld 132 (H) 65 - 99 mg/dL   BUN 15 6 - 20 mg/dL   Creatinine, Ser 1.20 (H)  0.44 - 1.00 mg/dL   Calcium 10.3 8.9 - 10.3 mg/dL   Total Protein 7.1 6.5 - 8.1 g/dL   Albumin 3.8 3.5 - 5.0 g/dL   AST 19 15 - 41 U/L   ALT 12 (L) 14 - 54 U/L   Alkaline Phosphatase 54 38 - 126 U/L   Total Bilirubin 0.3 0.3 - 1.2 mg/dL   GFR calc non Af Amer 46 (L) >60 mL/min   GFR calc Af Amer 53 (L) >60 mL/min   Anion gap 8 5 - 15  Troponin I     Status: None   Collection Time: 10/22/15  8:52 PM  Result Value Ref Range   Troponin I <0.03 <0.03 ng/mL  I-Stat Troponin, ED (not at [facility])     Status: None   Collection Time: 10/22/15  8:57 PM  Result Value Ref Range   Troponin i, poc 0.01 0.00 - 0.08 ng/mL   Comment 3          Lipid panel     Status: Abnormal   Collection Time: 10/22/15  9:03 PM  Result Value Ref Range   Cholesterol 208 (H) 0 - 200 mg/dL   Triglycerides 67 <150 mg/dL   HDL 102 >40 mg/dL   Total CHOL/HDL Ratio 2.0 RATIO   VLDL 13 0 - 40 mg/dL   LDL Cholesterol 93 0 - 99 mg/dL   Dg Chest 2 View  Result Date: 10/22/2015 CLINICAL DATA:  Chest pain. EXAM: CHEST  2 VIEW COMPARISON:  None. FINDINGS: The heart size and mediastinal contours are within normal limits. Both lungs are clear. The visualized skeletal structures are unremarkable. IMPRESSION: No active cardiopulmonary disease. Electronically Signed   By: Fidela Salisbury M.D.   On: 10/22/2015 22:53   Ekg: NSR, no STEMI  ASSESSMENT: 1. Chest pain pleuritic in nature r/o ACS     If trops stay negative she can have a stress test if trop are positive then left heart cath   PLAN/DISCUSSION: 1. Admit under obs 2. Cycle trop 3. Echo 4. Stress test vs LHC in am  5. NPO midnight [place]

## 2015-10-23 ENCOUNTER — Observation Stay (HOSPITAL_COMMUNITY): Payer: Medicare Other

## 2015-10-23 ENCOUNTER — Encounter (HOSPITAL_COMMUNITY): Payer: Self-pay | Admitting: Cardiology

## 2015-10-23 DIAGNOSIS — R079 Chest pain, unspecified: Secondary | ICD-10-CM

## 2015-10-23 DIAGNOSIS — D3501 Benign neoplasm of right adrenal gland: Secondary | ICD-10-CM

## 2015-10-23 DIAGNOSIS — I10 Essential (primary) hypertension: Secondary | ICD-10-CM | POA: Diagnosis present

## 2015-10-23 HISTORY — DX: Essential (primary) hypertension: I10

## 2015-10-23 HISTORY — DX: Benign neoplasm of right adrenal gland: D35.01

## 2015-10-23 LAB — CBC
HEMATOCRIT: 36 % (ref 36.0–46.0)
HEMOGLOBIN: 11.9 g/dL — AB (ref 12.0–15.0)
MCH: 29 pg (ref 26.0–34.0)
MCHC: 33.1 g/dL (ref 30.0–36.0)
MCV: 87.6 fL (ref 78.0–100.0)
Platelets: 358 10*3/uL (ref 150–400)
RBC: 4.11 MIL/uL (ref 3.87–5.11)
RDW: 15.8 % — AB (ref 11.5–15.5)
WBC: 19.2 10*3/uL — AB (ref 4.0–10.5)

## 2015-10-23 LAB — TROPONIN I: Troponin I: <0.03 ng/mL (ref <0.03)

## 2015-10-23 LAB — BASIC METABOLIC PANEL
ANION GAP: 11 (ref 5–15)
BUN: 13 mg/dL (ref 6–20)
CALCIUM: 9.9 mg/dL (ref 8.9–10.3)
CO2: 21 mmol/L — ABNORMAL LOW (ref 22–32)
Chloride: 105 mmol/L (ref 101–111)
Creatinine, Ser: 1.11 mg/dL — ABNORMAL HIGH (ref 0.44–1.00)
GFR calc Af Amer: 58 mL/min — ABNORMAL LOW (ref >60)
GFR, EST NON AFRICAN AMERICAN: 50 mL/min — AB (ref >60)
Glucose, Bld: 101 mg/dL — ABNORMAL HIGH (ref 65–99)
POTASSIUM: 4.5 mmol/L (ref 3.5–5.1)
SODIUM: 137 mmol/L (ref 135–145)

## 2015-10-23 LAB — EXERCISE TOLERANCE TEST
CHL CUP MPHR: 153 {beats}/min
CHL CUP RESTING HR STRESS: 88 {beats}/min
CSEPED: 5 min
CSEPPHR: 134 {beats}/min
Estimated workload: 7.1 METS
Exercise duration (sec): 27 s
Percent HR: 87 %

## 2015-10-23 LAB — TSH: TSH: 0.835 u[IU]/mL (ref 0.350–4.500)

## 2015-10-23 LAB — HEMOGLOBIN: Hemoglobin: 12.3 g/dL (ref 12.0–15.0)

## 2015-10-23 LAB — MRSA PCR SCREENING: MRSA BY PCR: NEGATIVE

## 2015-10-23 MED ORDER — INFLUENZA VAC SPLIT QUAD 0.5 ML IM SUSY
0.5000 mL | PREFILLED_SYRINGE | INTRAMUSCULAR | Status: AC
  Administered 2015-10-23: 0.5 mL via INTRAMUSCULAR

## 2015-10-23 MED ORDER — MORPHINE SULFATE (PF) 2 MG/ML IV SOLN
2.0000 mg | Freq: Once | INTRAVENOUS | Status: AC
  Administered 2015-10-23: 2 mg via INTRAVENOUS
  Filled 2015-10-23: qty 1

## 2015-10-23 MED ORDER — INFLUENZA VAC SPLIT QUAD 0.5 ML IM SUSY
0.5000 mL | PREFILLED_SYRINGE | INTRAMUSCULAR | Status: DC
Start: 2015-10-24 — End: 2015-10-23

## 2015-10-23 MED ORDER — LISINOPRIL 20 MG PO TABS
20.0000 mg | ORAL_TABLET | Freq: Every day | ORAL | Status: DC
  Administered 2015-10-23: 20 mg via ORAL
  Filled 2015-10-23: qty 1

## 2015-10-23 MED ORDER — PNEUMOCOCCAL VAC POLYVALENT 25 MCG/0.5ML IJ INJ
0.5000 mL | INJECTION | INTRAMUSCULAR | Status: AC
  Administered 2015-10-23: 0.5 mL via INTRAMUSCULAR

## 2015-10-23 MED ORDER — HYDROCHLOROTHIAZIDE 25 MG PO TABS
25.0000 mg | ORAL_TABLET | Freq: Every day | ORAL | Status: DC
  Administered 2015-10-23: 25 mg via ORAL
  Filled 2015-10-23: qty 1

## 2015-10-23 MED ORDER — PNEUMOCOCCAL VAC POLYVALENT 25 MCG/0.5ML IJ INJ: 0.5000 mL | INJECTION | INTRAMUSCULAR | Status: DC

## 2015-10-23 NOTE — Care Management Obs Status (Signed)
[place] NOTIFICATION   Patient Details  Name: Judy Chang MRN: [id number] Date of Birth: Dec 20, 1950   Medicare Observation Status Notification Given:  Yes    Lacretia Leigh, RN 10/23/2015, 1:22 PM

## 2015-10-23 NOTE — Discharge Summary (Signed)
Physician Discharge Summary       Patient ID: Judy Chang MRN: [id number] DOB/AGE: 07-20-1950 65 y.o.  Admit date: 10/22/2015 Discharge date: 10/23/2015 Primary Cardiologist:  New Dr. Percival Spanish   Discharge Diagnoses:  Principal Problem:   Chest pain, neg MI, neg for pul. embolus, normal ETT Active Problems:   Benign essential HTN   Adenoma of right adrenal gland   Discharged Condition: good  Procedures: ETT:  Plain old exercise stress test Study Highlights    Blood pressure demonstrated a normal response to exercise.  There was no ST segment deviation noted during stress.  No T wave inversion was noted during stress.    Read by Dr. Percival Spanish.   Hospital Course:  65 y/o african [place] woman with pmh of hlp, htn, DJD brought in by EMS as a code STEMI. She started to have pain this morning intermittently but became worse at night so called EMS. Her intial ekg was concerning for STEMI so code stemi was called. No prior CAD or similar episodes.   C/o epigastric pain, also has nausea associated with it. In ED she was given ntg with minimal relief and morphine resolved the pain. The pain is also worse with deep breathing. cta performed to r/o p.e  And was negative.  EKG does not meet STEMI criteria but ST in lateral leads are not completely normal.    She was admitted for serial troponins which are negative.  She then underwent ETT which was negative for ischemia as well.    She was seen and evaluated by Dr. Percival Spanish and found stable for discharge.  She has ultram at home.   Allergy to  NSAIDS.  She was reassured.  She will have lunch and if stable will be discharged.  All questions answered.    On CT scan she was found to have prominent thyroid gland-possible goiter though TSH is normal.  Also found to have 1.6.cm rt. Adrenal adenoma.  She was instructed to follow up with her PCP Dr. [name] and to see him for non cardiac issues.      Consults: None  Significant  Diagnostic Studies:   BMP Latest Ref Rng & Units 10/23/2015 10/22/2015 11/25/2014  Glucose 65 - 99 mg/dL 101(H) 132(H) 93  BUN 6 - 20 mg/dL 13 15 18   Creatinine 0.44 - 1.00 mg/dL 1.11(H) 1.20(H) 1.54(H)  Sodium 135 - 145 mmol/L 137 136 127(L)  Potassium 3.5 - 5.1 mmol/L 4.5 4.2 4.9  Chloride 101 - 111 mmol/L 105 102 95(L)  CO2 22 - 32 mmol/L 21(L) 26 22  Calcium 8.9 - 10.3 mg/dL 9.9 10.3 10.5(H)   CBC Latest Ref Rng & Units 10/23/2015 10/23/2015 10/22/2015  WBC 4.0 - 10.5 K/uL - 19.2(H) 16.5(H)  Hemoglobin 12.0 - 15.0 g/dL 12.3 11.9(L) 13.2  Hematocrit 36.0 - 46.0 % - 36.0 39.4  Platelets 150 - 400 K/uL - 358 289   Troponin <0.03 X 3  Lipid Panel     Component Value Date/Time   CHOL 208 (H) 10/22/2015 2103   TRIG 67 10/22/2015 2103   HDL 102 10/22/2015 2103   CHOLHDL 2.0 10/22/2015 2103   VLDL 13 10/22/2015 2103   LDLCALC 93 10/22/2015 2103   TSH 0.835  EKG SR possible ST elevation, hx of mild ST elevation in past.   CT ANGIOGRAPHY CHEST WITH CONTRAST  TECHNIQUE: Multidetector CT imaging of the chest was performed using the standard protocol during bolus administration of intravenous contrast. Multiplanar CT image reconstructions and MIPs  were obtained to evaluate the vascular anatomy.  CONTRAST:  80 mL of Isovue 370 IV contrast  COMPARISON:  Chest radiograph performed earlier today at 10:29 p.m.  FINDINGS: Cardiovascular:  There is no evidence of pulmonary embolus.  The heart is grossly unremarkable in appearance. Minimal calcification is noted along the aortic arch. The great vessels are unremarkable in appearance.  Mediastinum/Nodes: The mediastinum is unremarkable in appearance. No mediastinal lymphadenopathy is seen. No pericardial effusion is identified. The thyroid gland appears somewhat prominent, raising concern for thyroid goiter. No axillary lymphadenopathy is seen.  Lungs/Pleura: Minimal bibasilar atelectasis is noted. The lungs are otherwise  clear. No focal consolidation, pleural effusion or pneumothorax is seen. No masses are identified. There is mild bilateral bronchomalacia.  Upper Abdomen: The visualized portions of the liver and spleen are grossly unremarkable. The visualized portions of the gallbladder, pancreas and left adrenal gland are unremarkable. A 1.6 cm right adrenal adenoma is seen.  Musculoskeletal: No acute osseous abnormalities are identified. The visualized musculature is unremarkable in appearance.  Review of the MIP images confirms the above findings.  IMPRESSION: 1. No evidence of pulmonary embolus. 2. Minimal bibasilar atelectasis noted.  Lungs otherwise clear. 3. Mild bilateral bronchomalacia noted. 4. Thyroid gland appears somewhat prominent, raising concern for thyroid goiter. Would correlate with lab values. 5. 1.6 cm right adrenal adenoma noted.   CHEST  2 VIEW COMPARISON:  None. FINDINGS: The heart size and mediastinal contours are within normal limits. Both lungs are clear. The visualized skeletal structures are unremarkable. IMPRESSION: No active cardiopulmonary disease.  Discharge Exam: Blood pressure 122/68, pulse 81, temperature 98.3 F (36.8 C), temperature source Oral, resp. rate 13, height 5\' 1"  (1.549 m), weight 129 lb 13.6 oz (58.9 kg), SpO2 96 %.    Disposition: 01-Home or Self Care     Medication List    TAKE these medications   amLODipine 5 MG tablet Commonly known as:  NORVASC Take 5 mg by mouth daily.   buPROPion 150 MG 24 hr tablet Commonly known as:  WELLBUTRIN XL Take 450 mg by mouth daily.   diclofenac sodium 1 % Gel Commonly known as:  VOLTAREN Apply 1 application topically daily as needed (pain).   FLUoxetine 20 MG capsule Commonly known as:  PROZAC Take 20 mg by mouth daily.   GARLIC PO Take 1 tablet by mouth 2 (two) times daily.   lisinopril-hydrochlorothiazide 20-25 MG tablet Commonly known as:  PRINZIDE,ZESTORETIC Take 1 tablet  by mouth daily.   Oxycodone HCl 20 MG Tabs Take 1 tablet by mouth 3 (three) times daily as needed (pain).   traMADol 50 MG tablet Commonly known as:  ULTRAM Take 50 mg by mouth 2 (two) times daily as needed for moderate pain.   traZODone 50 MG tablet Commonly known as:  DESYREL Take 50 mg by mouth at bedtime.      Follow-up Information    [name], MD. Schedule an appointment as soon as possible for a visit today.   Specialty:  Family Medicine Why:  to be seen for adrenal gland and evaluate you for non cardiac chest pain. Contact information: [address] [place] [address] [phone]            Discharge Instructions: Heart healthy diet.   See your PCP as instructed.  You may use your ultram for the pain.    Signed: Cecilie Kicks Nurse Practitioner-Certified [facility]: [facility] 10/23/2015, 1:57 PM  Time spent on discharge :  30 minutes.

## 2015-10-23 NOTE — ED Notes (Signed)
Dr.Chapman at bedside updating pt and husband

## 2015-10-23 NOTE — Progress Notes (Addendum)
Patient Name: Judy Chang Date of Encounter: 10/23/2015  Hospital Problem List     Active Problems:   Chest pain    Patient Profile     65 y/o african [place] woman with pmh of hlp, htn, DJD brought in by EMS as a code STEMI.   However, EKG was not consistent with code STEMI  Subjective   No chest pain.    Inpatient Medications    . amLODipine  5 mg Oral Daily  . buPROPion  450 mg Oral Daily  . FLUoxetine  20 mg Oral Daily  . hydrochlorothiazide  25 mg Oral Daily  . [START ON 10/24/2015] Influenza vac split quadrivalent PF  0.5 mL Intramuscular Tomorrow-1000  . lisinopril  20 mg Oral Daily  . morphine      . [START ON 10/24/2015] pneumococcal 23 valent vaccine  0.5 mL Intramuscular Tomorrow-1000  . sodium chloride flush  3 mL Intravenous Q12H  . traZODone  50 mg Oral QHS    Vital Signs    Vitals:   10/23/15 0100 10/23/15 0108 10/23/15 0400 10/23/15 0451  BP:  137/68 114/69 117/64  Pulse:      Resp:  16 15 16   Temp: 98 F (36.7 C)   99.2 F (37.3 C)  TempSrc: Oral   Oral  SpO2:  98% 95% 96%  Weight: 129 lb 13.6 oz (58.9 kg)     Height: 5\' 1"  (1.549 m)       Intake/Output Summary (Last 24 hours) at 10/23/15 0745 Last data filed at 10/23/15 0222  Gross per 24 hour  Intake                3 ml  Output                0 ml  Net                3 ml   Filed Weights   10/22/15 2056 10/23/15 0100  Weight: 120 lb (54.4 kg) 129 lb 13.6 oz (58.9 kg)    Physical Exam    GEN: Well nourished, well developed, in  no acute distress.  Neck: Supple, no JVD, carotid bruits, or masses. Cardiac: RRR, no rubs, or gallops. No clubbing, cyanosis, no edema.  Radials/DP/PT 2+ and equal bilaterally.  Respiratory:  Respirations  regular and unlabored, clear to auscultation bilaterally. GI: Soft, nontender, nondistended, BS + x 4. Neuro:  Strength and sensation are intact.   Labs    CBC  Recent Labs  10/22/15 2052 10/23/15 0045 10/23/15 0113  WBC 16.5* 19.2*  --    NEUTROABS 12.5*  --   --   HGB 13.2 11.9* 12.3  HCT 39.4 36.0  --   MCV 87.2 87.6  --   PLT 289 358  --    Basic Metabolic Panel  Recent Labs  10/22/15 2052 10/23/15 0045  NA 136 137  K 4.2 4.5  CL 102 105  CO2 26 21*  GLUCOSE 132* 101*  BUN 15 13  CREATININE 1.20* 1.11*  CALCIUM 10.3 9.9   Liver Function Tests  Recent Labs  10/22/15 2052  AST 19  ALT 12*  ALKPHOS 54  BILITOT 0.3  PROT 7.1  ALBUMIN 3.8   No results for input(s): LIPASE, AMYLASE in the last 72 hours. Cardiac Enzymes  Recent Labs  10/22/15 2052 10/23/15 0045 10/23/15 0113  TROPONINI <0.03 <0.03 <0.03   BNP Invalid input(s): POCBNP D-Dimer No results for input(s): DDIMER in  the last 72 hours. Hemoglobin A1C No results for input(s): HGBA1C in the last 72 hours. Fasting Lipid Panel  Recent Labs  10/22/15 2103  CHOL 208*  HDL 102  LDLCALC 93  TRIG 67  CHOLHDL 2.0   Thyroid Function Tests  Recent Labs  10/23/15 0113  TSH 0.835    Telemetry    NSR  ECG    NA  Radiology    Dg Chest 2 View  Result Date: 10/22/2015 CLINICAL DATA:  Chest pain. EXAM: CHEST  2 VIEW COMPARISON:  None. FINDINGS: The heart size and mediastinal contours are within normal limits. Both lungs are clear. The visualized skeletal structures are unremarkable. IMPRESSION: No active cardiopulmonary disease. Electronically Signed   By: Fidela Salisbury M.D.   On: 10/22/2015 22:53   Ct Angio Chest Pe W And/or Wo Contrast  Result Date: 10/22/2015 CLINICAL DATA:  Acute onset of generalized chest and back pain. Initial encounter. EXAM: CT ANGIOGRAPHY CHEST WITH CONTRAST TECHNIQUE: Multidetector CT imaging of the chest was performed using the standard protocol during bolus administration of intravenous contrast. Multiplanar CT image reconstructions and MIPs were obtained to evaluate the vascular anatomy. CONTRAST:  80 mL of Isovue 370 IV contrast COMPARISON:  Chest radiograph performed earlier today at 10:29  p.m. FINDINGS: Cardiovascular:  There is no evidence of pulmonary embolus. The heart is grossly unremarkable in appearance. Minimal calcification is noted along the aortic arch. The great vessels are unremarkable in appearance. Mediastinum/Nodes: The mediastinum is unremarkable in appearance. No mediastinal lymphadenopathy is seen. No pericardial effusion is identified. The thyroid gland appears somewhat prominent, raising concern for thyroid goiter. No axillary lymphadenopathy is seen. Lungs/Pleura: Minimal bibasilar atelectasis is noted. The lungs are otherwise clear. No focal consolidation, pleural effusion or pneumothorax is seen. No masses are identified. There is mild bilateral bronchomalacia. Upper Abdomen: The visualized portions of the liver and spleen are grossly unremarkable. The visualized portions of the gallbladder, pancreas and left adrenal gland are unremarkable. A 1.6 cm right adrenal adenoma is seen. Musculoskeletal: No acute osseous abnormalities are identified. The visualized musculature is unremarkable in appearance. Review of the MIP images confirms the above findings. IMPRESSION: 1. No evidence of pulmonary embolus. 2. Minimal bibasilar atelectasis noted.  Lungs otherwise clear. 3. Mild bilateral bronchomalacia noted. 4. Thyroid gland appears somewhat prominent, raising concern for thyroid goiter. Would correlate with lab values. 5. 1.6 cm right adrenal adenoma noted. Electronically Signed   By: Garald Balding M.D.   On: 10/22/2015 23:48    Assessment & Plan    CHEST PAIN:  Atypical.  No objective evidence of ischemia. Plan POET (Plain Old Exercise Treadmill)  ADRENAL ADENOMA:  Discussed with the patient.  Needs follow up as an outpatient.  TOBACCO:   Educated.    Signed, Minus Breeding, MD  10/23/2015, 7:45 AM   Addendum:  Ree Edman (Plain Old Exercise Treadmill) reviewed.  No evidence of ischemia.  Negative adequate stress test.  No objective evidence of ischemia.  OK to discharge.   Follow with [name], MD to evaluate non cardiac causes of chest pain.

## 2015-10-23 NOTE — ED Notes (Signed)
Pt requesting update on labs and ct scan. Dr.Chapman made aware and will update pt prior to transport

## 2015-10-23 NOTE — Progress Notes (Signed)
Patient has been discharged with AVS and has no questions. IV was removed. Family member was here to pick up the patient.

## 2015-10-23 NOTE — Discharge Instructions (Signed)
Heart healthy diet.   See your PCP as instructed.  You may use your ultram for the pain.

## 2015-10-24 NOTE — ED Provider Notes (Signed)
[facility] DEPT Provider Note  CSN: [id number] Arrival Date & Time: 10/22/15 @ 2043  History    Chief Complaint Chief Complaint  Patient presents with  . Code STEMI    HPI Judy Chang is a 65 y.o. female. Presents w/ CP that was pleuritic and since this AM. Worsened tonight therefore called EMS. Similar episode last night. Located in epigastrium, and does not radiate. Associated nausea. No abdominal surgeries. EMS concerned for STEMI however canceled upon arrival. Mild SOB. Pain not associated w/ exertion.  Past Medical & Surgical History    Past Medical History:  Diagnosis Date  . 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 Arthritis   . Benign essential HTN 10/23/2015  . DJD (degenerative joint disease)   . Hypercholesterolemia   . Hypertension    Patient Active Problem List   Diagnosis Date Noted  . Benign essential HTN 10/23/2015  . 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 Chest pain, neg MI, neg for pul. embolus, normal ETT 10/22/2015   History reviewed. No pertinent surgical history.  Family & Social History    No family history on file. Social History  Substance Use Topics  . Smoking status: Current Every Day Smoker  . Smokeless tobacco: Never Used  . Alcohol use Yes     Comment: every few days    Home Medications    Prior to Admission medications   Medication Sig Start Date End Date Taking? Authorizing Provider  amLODipine (NORVASC) 5 MG tablet Take 5 mg by mouth daily. 11/21/14  Yes Historical Provider, MD  buPROPion (WELLBUTRIN XL) 150 MG 24 hr tablet Take 450 mg by mouth daily.  11/12/14  Yes Historical Provider, MD  diclofenac sodium (VOLTAREN) 1 % GEL Apply 1 application topically daily as needed (pain).  11/23/14  Yes Historical Provider, MD  FLUoxetine (PROZAC) 20 MG capsule Take 20 mg by mouth daily.   Yes Historical Provider, MD  GARLIC PO Take 1 tablet by mouth 2 (two) times daily.   Yes Historical Provider, MD    lisinopril-hydrochlorothiazide (PRINZIDE,ZESTORETIC) 20-25 MG tablet Take 1 tablet by mouth daily. 11/17/14  Yes Historical Provider, MD  Oxycodone HCl 20 MG TABS Take 1 tablet by mouth 3 (three) times daily as needed (pain).  10/29/14  Yes Historical Provider, MD  traMADol (ULTRAM) 50 MG tablet Take 50 mg by mouth 2 (two) times daily as needed for moderate pain.  11/12/14  Yes Historical Provider, MD  traZODone (DESYREL) 50 MG tablet Take 50 mg by mouth at bedtime. 11/18/14  Yes Historical Provider, MD    Allergies    Nsaids  I reviewed & agree with nursing's documentation on the patient's past medical, surgical, social & family histories as well as their allergies.  Review of Systems  Complete ROS obtained, and is negative except as stated in HPI.   Physical Exam  Updated Vital Signs BP 113/66 (BP Location: Left Arm)   Pulse 81   Temp 98.3 F (36.8 C) (Oral)   Resp 17   Ht 5\' 1"  (1.549 m)   Wt 58.9 kg   SpO2 95%   BMI 24.54 kg/m  I have reviewed the triage vital signs and the nursing notes. Physical Exam CONST: Patient anxious, in mild to moderate distress.  EYES: PERRLA. EOMI. Conjunctiva w/o d/c. Lids AT w/o swelling.  ENMT: External Nares & Ears AT w/o swelling. Oropharynx patent. MM moist.  NECK: ROM full w/o rigidity. Trachea midline. JVD absent.  CVS: +S1/S2 w/o obvious  murmur. Lower extremities w/o pitting edema.  RESP: Respiratory effort unlabored w/o retractions or accessory muscle use. BS clear bilaterally.  GI: Soft & ND. +BS x 4. TTP absent. Hernia absent. Guarding & Rebound absent.  BACK: CVA TTP absent bilaterally.  SKIN: Skin warm & dry. Turgor good. No rash.  PSYCH: Alert. Oriented. Affect and mood appropriate.  NEURO: CN II-XII grossly intact. Motor exam symmetric w/ upper & lower extremities 5/5 bilaterally. Sensation grossly intact.  MSK: Joints located & stable, w/o obvious dislocation & obvious deformity or crepitus absent w/ Cap refill < 2 sec.  Peripheral pulses 2+ & equal in all extremities.    ED Treatments & Results   Labs (only abnormal results are displayed) Labs Reviewed  CBC - Abnormal; Notable for the following:       Result Value   WBC 16.5 (*)    RDW 16.1 (*)    All other components within normal limits  DIFFERENTIAL - Abnormal; Notable for the following:    Neutro Abs 12.5 (*)    Monocytes Absolute 1.7 (*)    All other components within normal limits  APTT - Abnormal; Notable for the following:    aPTT 22 (*)    All other components within normal limits  COMPREHENSIVE METABOLIC PANEL - Abnormal; Notable for the following:    Glucose, Bld 132 (*)    Creatinine, Ser 1.20 (*)    ALT 12 (*)    GFR calc non Af Amer 46 (*)    GFR calc Af Amer 53 (*)    All other components within normal limits  LIPID PANEL - Abnormal; Notable for the following:    Cholesterol 208 (*)    All other components within normal limits  BASIC METABOLIC PANEL - Abnormal; Notable for the following:    CO2 21 (*)    Glucose, Bld 101 (*)    Creatinine, Ser 1.11 (*)    GFR calc non Af Amer 50 (*)    GFR calc Af Amer 58 (*)    All other components within normal limits  CBC - Abnormal; Notable for the following:    WBC 19.2 (*)    Hemoglobin 11.9 (*)    RDW 15.8 (*)    All other components within normal limits  MRSA PCR SCREENING  PROTIME-INR  TROPONIN I  TSH  HEMOGLOBIN  TROPONIN I  TROPONIN I  TROPONIN I  I-STAT TROPOININ, ED    EKG    EKG Interpretation  Date/Time:  Thursday October 22 2015 20:47:21 EDT Ventricular Rate:  80 PR Interval:    QRS Duration: 83 QT Interval:  369 QTC Calculation: 426 R Axis:   55 Text Interpretation:  Sinus rhythm ST elevation, consider inferior injury Confirmed by Gilford Raid MD, JULIE ([id number]) on 10/22/2015 9:29:59 PM       Radiology Dg Chest 2 View  Result Date: 10/22/2015 CLINICAL DATA:  Chest pain. EXAM: CHEST  2 VIEW COMPARISON:  None. FINDINGS: The heart size and mediastinal  contours are within normal limits. Both lungs are clear. The visualized skeletal structures are unremarkable. IMPRESSION: No active cardiopulmonary disease. Electronically Signed   By: Fidela Salisbury M.D.   On: 10/22/2015 22:53   Ct Angio Chest Pe W And/or Wo Contrast  Result Date: 10/22/2015 CLINICAL DATA:  Acute onset of generalized chest and back pain. Initial encounter. EXAM: CT ANGIOGRAPHY CHEST WITH CONTRAST TECHNIQUE: Multidetector CT imaging of the chest was performed using the standard protocol during bolus administration of intravenous contrast.  Multiplanar CT image reconstructions and MIPs were obtained to evaluate the vascular anatomy. CONTRAST:  80 mL of Isovue 370 IV contrast COMPARISON:  Chest radiograph performed earlier today at 10:29 p.m. FINDINGS: Cardiovascular:  There is no evidence of pulmonary embolus. The heart is grossly unremarkable in appearance. Minimal calcification is noted along the aortic arch. The great vessels are unremarkable in appearance. Mediastinum/Nodes: The mediastinum is unremarkable in appearance. No mediastinal lymphadenopathy is seen. No pericardial effusion is identified. The thyroid gland appears somewhat prominent, raising concern for thyroid goiter. No axillary lymphadenopathy is seen. Lungs/Pleura: Minimal bibasilar atelectasis is noted. The lungs are otherwise clear. No focal consolidation, pleural effusion or pneumothorax is seen. No masses are identified. There is mild bilateral bronchomalacia. Upper Abdomen: The visualized portions of the liver and spleen are grossly unremarkable. The visualized portions of the gallbladder, pancreas and left adrenal gland are unremarkable. A 1.6 cm right adrenal adenoma is seen. Musculoskeletal: No acute osseous abnormalities are identified. The visualized musculature is unremarkable in appearance. Review of the MIP images confirms the above findings. IMPRESSION: 1. No evidence of pulmonary embolus. 2. Minimal bibasilar  atelectasis noted.  Lungs otherwise clear. 3. Mild bilateral bronchomalacia noted. 4. Thyroid gland appears somewhat prominent, raising concern for thyroid goiter. Would correlate with lab values. 5. 1.6 cm right adrenal adenoma noted. Electronically Signed   By: Garald Balding M.D.   On: 10/22/2015 23:48    Pertinent labs & imaging results that were available during my care of the patient were personally visualized by me and considered in my medical decision making, please see chart for details.  Procedures (including critical care time) Procedures  Medications Ordered in ED Medications  morphine 2 MG/ML injection (not administered)  morphine 4 MG/ML injection 4 mg (2 mg Intravenous Given 10/22/15 2102)  ondansetron (ZOFRAN) injection 4 mg (4 mg Intravenous Given 10/22/15 2101)  morphine 4 MG/ML injection 4 mg (4 mg Intravenous Given 10/22/15 2124)  heparin injection 3,500 Units (3,500 Units Intravenous Given 10/22/15 2230)  iopamidol (ISOVUE-370) 76 % injection (100 mLs  Contrast Given 10/22/15 2305)  morphine 2 MG/ML injection 2 mg (2 mg Intravenous Given 10/23/15 0048)  Influenza vac split quadrivalent PF (FLUARIX) injection 0.5 mL (0.5 mLs Intramuscular Given 10/23/15 1725)  pneumococcal 23 valent vaccine (PNU-IMMUNE) injection 0.5 mL (0.5 mLs Intramuscular Given 10/23/15 1725)    Initial Impression & Assessment / Evaluation & Plan / ED Course & Final Dispo   Initial Impression & Assessment Due to EMS ECG and concern for STEMI, I therefore contacted the Cardiology Service for assistance in the patient's care. Upon arrival, concern remains for ACS however comparison of arrival ECG and pre-hospital not consistent w/ STEMI. Given ASA 324 mg.   SOB and pleurisy however no fevers or chills, not febrile. Doubt PNA or sepsis.  Evaluation & Plan Serial Troponin negative, labs unremarkable. CTA Chest negative for PE.  ED Course & Results In light of above & the complexity of the patient's medical  condition, I believe the patient will require admission for continued medical intervention. ED Course in its entirety was reviewed w/ the patient and spouse/SO. Therefore will admit to the Cardiology Service. They request no further interventions prior to the patient's transport from the ED. Patient stable for transport. Level of Care determined by the Admitting Service.  Final Clinical Impressions(s) & ED Diagnoses  No diagnosis found. Patient care discussed with the attending physician, who oversaw their evaluation & treatment & voiced agreement. Note: This document was  prepared using Systems analyst and may include unintentional dictation errors.  House Officer: Voncille Lo, MD, Emergency Medicine Resident.   Voncille Lo, MD 10/24/15 4847    Isla Pence, MD 10/24/15 [phone]

## 2015-11-13 ENCOUNTER — Other Ambulatory Visit: Payer: Self-pay | Admitting: Family Medicine

## 2015-11-13 DIAGNOSIS — R1010 Upper abdominal pain, unspecified: Secondary | ICD-10-CM

## 2015-11-17 ENCOUNTER — Ambulatory Visit
Admission: RE | Admit: 2015-11-17 | Discharge: 2015-11-17 | Disposition: A | Discharge status: Home / Self Care | Payer: Medicare Other | Source: Ambulatory Visit | Attending: Family Medicine | Admitting: Family Medicine

## 2015-11-17 DIAGNOSIS — R1010 Upper abdominal pain, unspecified: Secondary | ICD-10-CM

## 2015-11-17 DIAGNOSIS — N281 Cyst of kidney, acquired: Secondary | ICD-10-CM

## 2015-11-17 HISTORY — DX: Cyst of kidney, acquired: N28.1

## 2015-11-24 ENCOUNTER — Other Ambulatory Visit: Payer: Medicare Other

## 2016-08-25 ENCOUNTER — Other Ambulatory Visit: Payer: Self-pay | Admitting: Family Medicine

## 2016-08-25 DIAGNOSIS — M5126 Other intervertebral disc displacement, lumbar region: Secondary | ICD-10-CM

## 2016-09-06 ENCOUNTER — Ambulatory Visit
Admission: RE | Admit: 2016-09-06 | Discharge: 2016-09-06 | Disposition: A | Discharge status: Home / Self Care | Payer: Medicare Other | Source: Ambulatory Visit | Attending: Family Medicine | Admitting: Family Medicine

## 2016-09-06 DIAGNOSIS — M4800 Spinal stenosis, site unspecified: Secondary | ICD-10-CM

## 2016-09-06 DIAGNOSIS — M5126 Other intervertebral disc displacement, lumbar region: Secondary | ICD-10-CM

## 2016-09-06 HISTORY — DX: Spinal stenosis, site unspecified: M48.00

## 2017-03-28 IMAGING — US US ABDOMEN COMPLETE
1 series · 14 of 25 positions shown · non-contrast
Comparison: None.

CLINICAL DATA: Upper abdominal pain for 2 months.

EXAM:
ABDOMEN ULTRASOUND COMPLETE

[Series 1: us abdomen complete · 0.23mm/px · 14 of 89 slices shown]
[im 1/89]
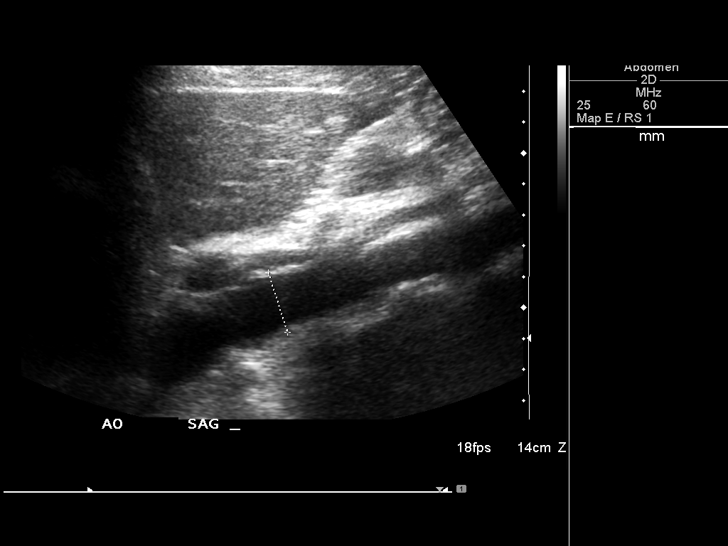
[im 8/89]
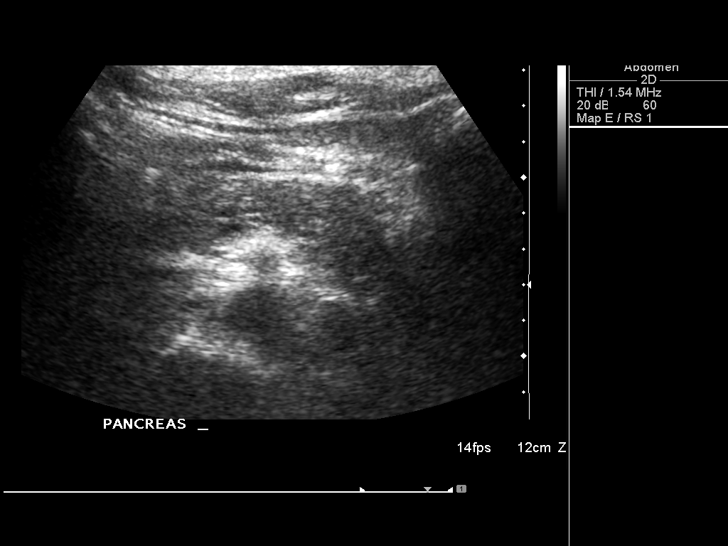
[im 15/89]
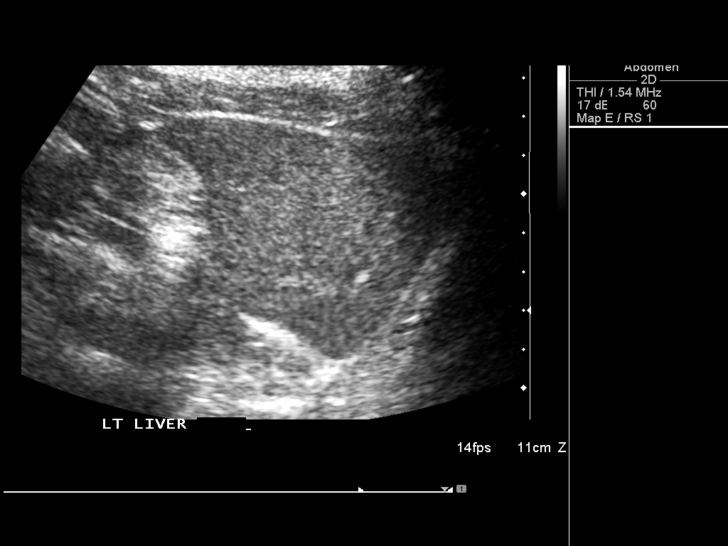
[im 23/89]
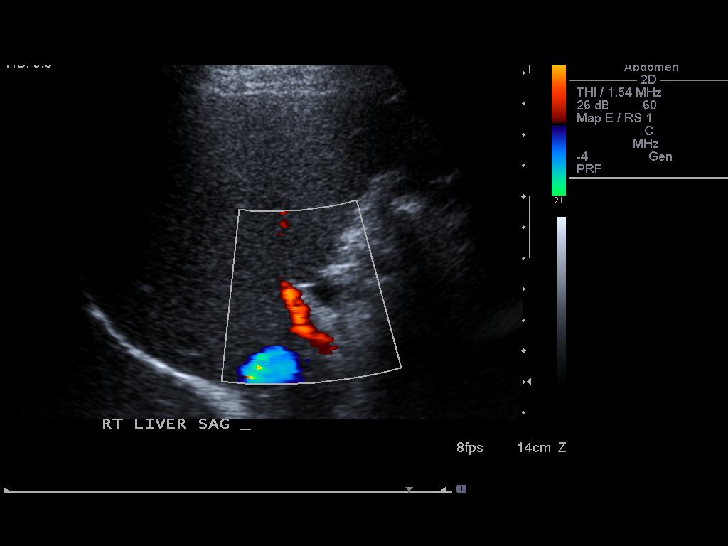
[im 30/89]
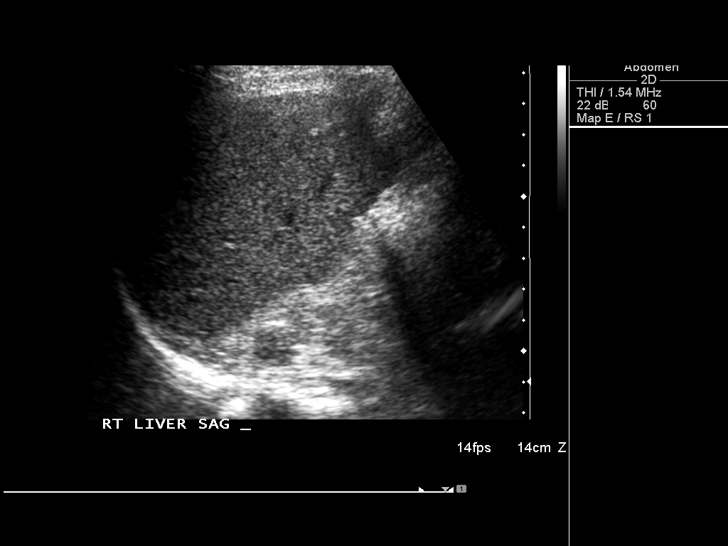
[im 34/89]
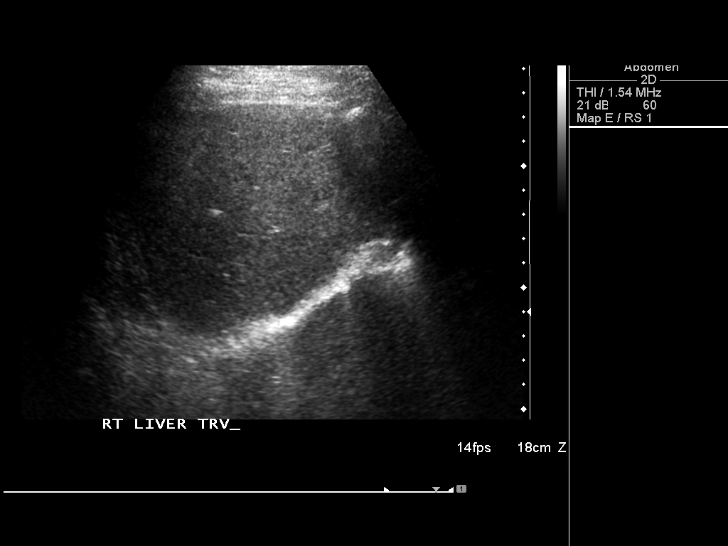
[im 41/89]
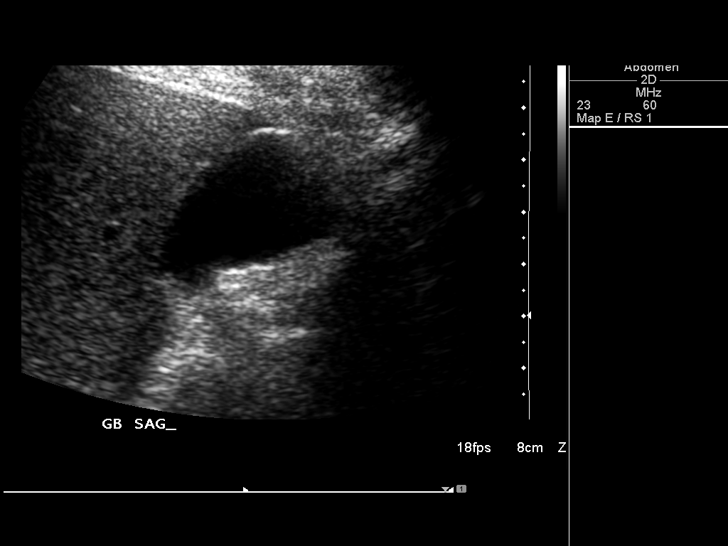
[im 48/89]
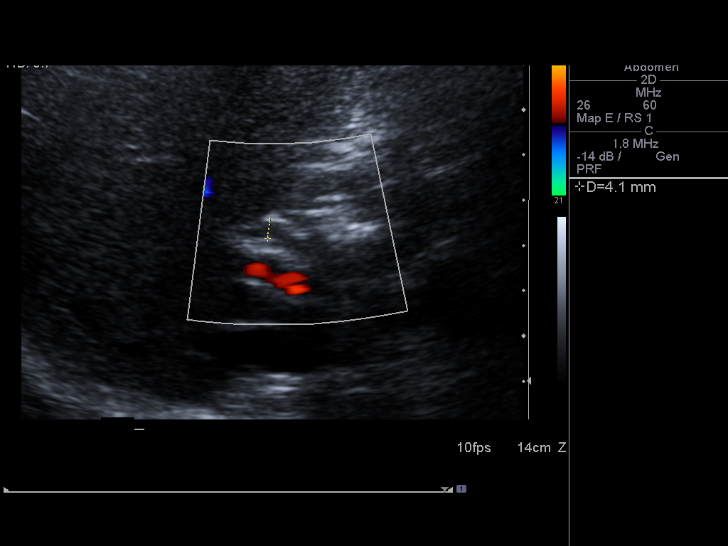
[im 56/89]
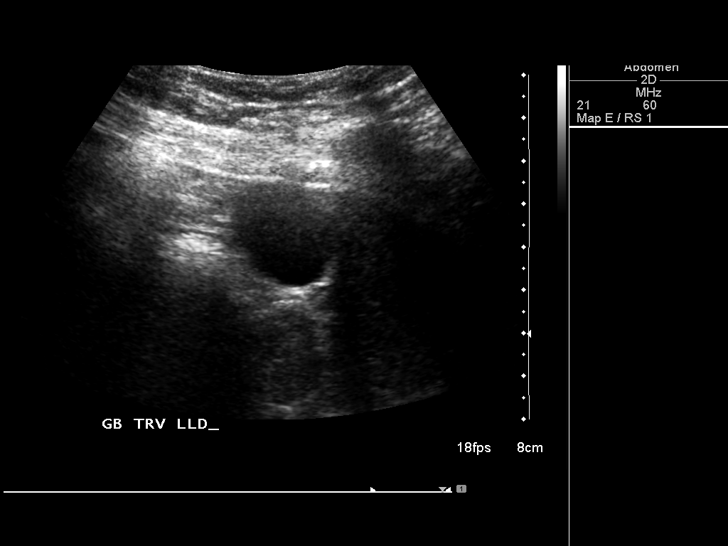
[im 59/89]
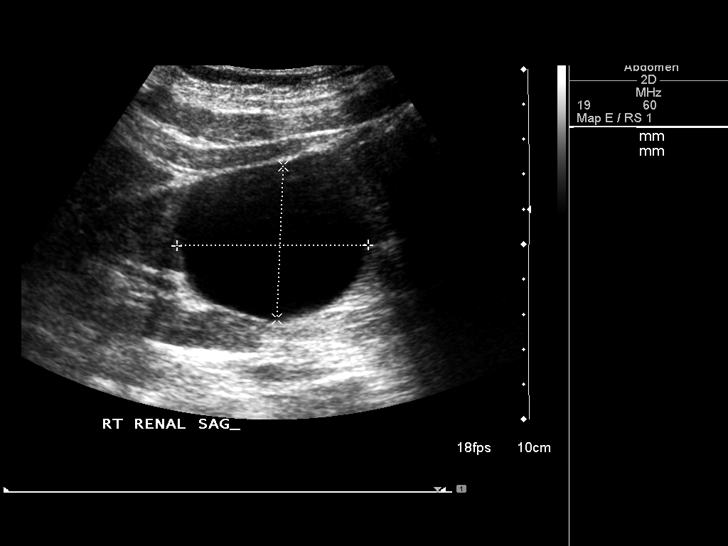
[im 67/89]
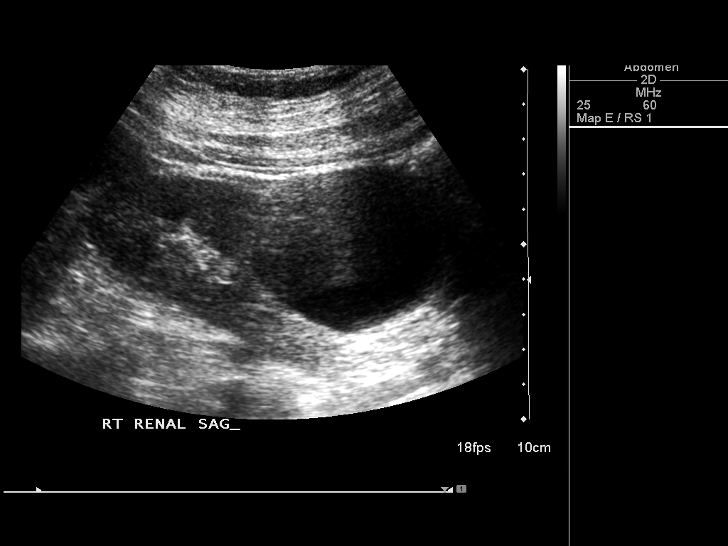
[im 74/89]
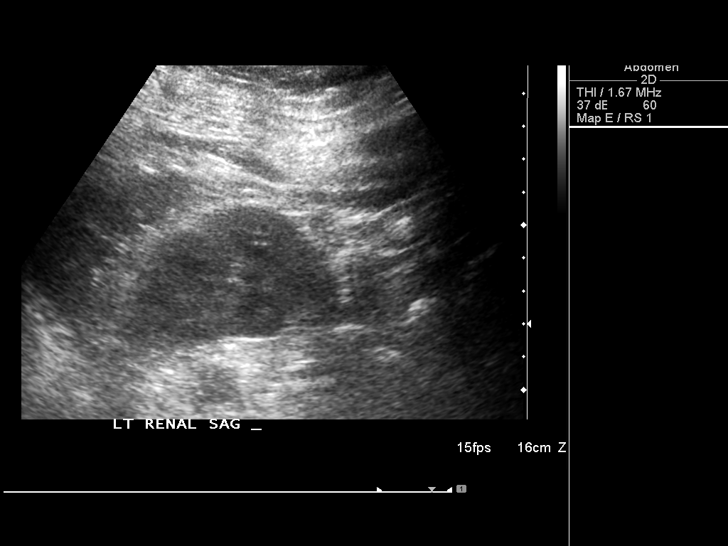
[im 81/89]
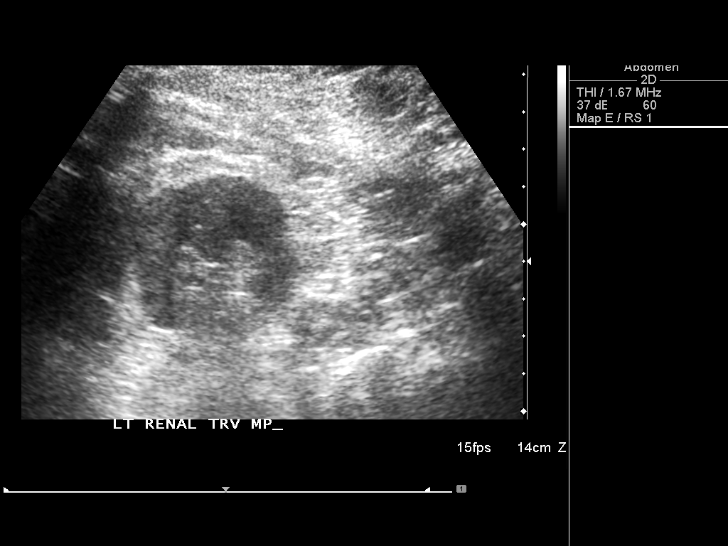
[im 89/89]
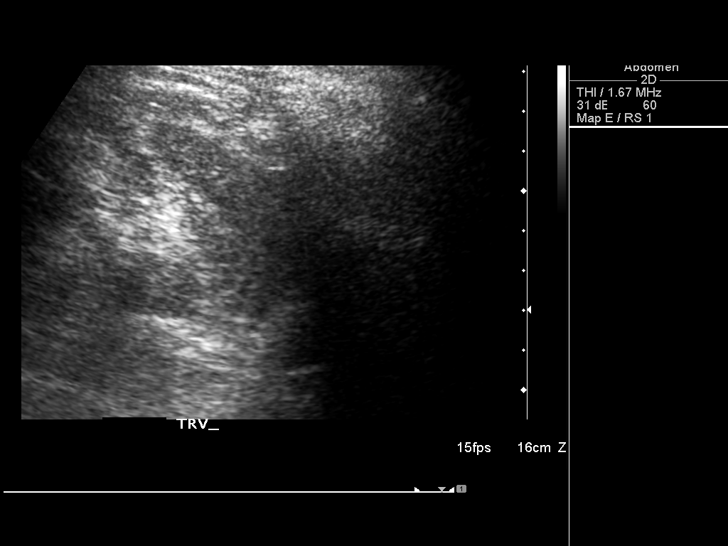

[14 of 25 positions shown; findings below may reference images not displayed]

FINDINGS: Gallbladder: No gallstones or wall thickening visualized. No
sonographic Murphy sign noted by sonographer.

Common bile duct: Diameter: 4.1 mm which is within normal limits.

Liver: No focal lesion identified. Within normal limits in
parenchymal echogenicity.

IVC: No abnormality visualized.

Pancreas: Visualized portion unremarkable.

Spleen: Size and appearance within normal limits.

Right Kidney: Length: 11.7 cm. 5.5 cm simple cyst is seen arising
from midpole. Echogenicity within normal limits. No mass or
hydronephrosis visualized.

Left Kidney: Length: 9.3 cm. Echogenicity within normal limits. No
mass or hydronephrosis visualized.

Abdominal aorta: No aneurysm visualized.

Other findings: None.
IMPRESSION: Simple right renal cyst.  No other abnormality seen in the abdomen.

## 2018-02-20 ENCOUNTER — Other Ambulatory Visit: Payer: Self-pay | Admitting: Orthopedic Surgery

## 2018-02-20 DIAGNOSIS — M25552 Pain in left hip: Secondary | ICD-10-CM

## 2018-02-25 ENCOUNTER — Ambulatory Visit
Admission: RE | Admit: 2018-02-25 | Discharge: 2018-02-25 | Disposition: A | Discharge status: Home / Self Care | Payer: Medicare Other | Source: Ambulatory Visit | Attending: Orthopedic Surgery | Admitting: Orthopedic Surgery

## 2018-02-25 DIAGNOSIS — M9420 Chondromalacia, unspecified site: Secondary | ICD-10-CM

## 2018-02-25 DIAGNOSIS — S3141XA Laceration without foreign body of vagina and vulva, initial encounter: Secondary | ICD-10-CM

## 2018-02-25 DIAGNOSIS — M25552 Pain in left hip: Secondary | ICD-10-CM

## 2018-02-25 HISTORY — DX: Laceration without foreign body of vagina and vulva, initial encounter: S31.41XA

## 2018-02-25 HISTORY — DX: Chondromalacia, unspecified site: M94.20

## 2018-03-30 ENCOUNTER — Other Ambulatory Visit: Payer: Self-pay | Admitting: Orthopedic Surgery

## 2018-05-14 ENCOUNTER — Ambulatory Visit (HOSPITAL_COMMUNITY): Admit: 2018-05-14 | Payer: Medicare Other | Admitting: Orthopedic Surgery

## 2018-05-14 ENCOUNTER — Encounter (HOSPITAL_COMMUNITY): Payer: Self-pay

## 2018-05-14 SURGERY — ARTHROPLASTY, HIP, TOTAL, ANTERIOR APPROACH
Anesthesia: Spinal | Laterality: Left

## 2018-06-04 ENCOUNTER — Other Ambulatory Visit: Payer: Self-pay | Admitting: Orthopedic Surgery

## 2018-06-13 ENCOUNTER — Encounter (HOSPITAL_COMMUNITY): Payer: Self-pay

## 2018-06-13 NOTE — Patient Instructions (Addendum)
DUE TO COVID-19 NO VISITORS ARE ALLOWED IN THE HOSPITAL AT THIS TIME   COVID SWAB TESTING MUST BE COMPLETED ON:  Tuesday, Jun 19, 2018 at 12:40PM   Your procedure is scheduled on: Friday, Jun 22, 2018   Surgery Time:   7:15AM-9:31AM   Report to [place]  Entrance   Report to Short Stay at 5:30 AM   Call this number if you have problems the morning of surgery [phone]   Do not eat food:After Midnight.   CLEAR LIQUID DIET  Foods Allowed                                                                     Foods Excluded  Water, Black Coffee and tea, regular and decaf                             liquids that you cannot  Plain Jell-O in any flavor                                             see through such as: Fruit ices (not with fruit pulp)                                     milk, soups, orange juice  Iced Popsicles                                    All solid food Carbonated beverages, regular and diet                                    Cranberry, grape and apple juices Sports drinks like Gatorade Lightly seasoned clear broth or consume(fat free) Sugar, honey syrup  Sample Menu Breakfast                                Lunch                                     Supper Cranberry juice                    Beef broth                            Chicken broth Jell-O                                     Grape juice                           Apple juice Coffee or  tea                        Jell-O                                      Popsicle                                                Coffee or tea                        Coffee or tea   Complete one Ensure drink the morning of surgery at  4:30AM the day of surgery.   Brush your teeth the morning of surgery.   Do NOT smoke after Midnight   Take these medicines the morning of surgery with A SIP OF WATER:  Amlodipine, Oxycodone, Pregabalin, Loratadine if needed                               You may not have any  metal on your body including hair pins, jewelry, and body piercings             Do not wear make-up, lotions, powders, perfumes/cologne, or deodorant             Do not wear nail polish.  Do not shave  48 hours prior to surgery.                Do not bring valuables to the hospital. [place].   Contacts, dentures or bridgework may not be worn into surgery.   Bring small overnight bag day of surgery.   Special Instructions: Bring a copy of your healthcare power of attorney and living will documents         the day of surgery if you haven't scanned them in before.              Please read over the following fact sheets you were given:  [facility] - Preparing for Surgery Before surgery, you can play an important role.  Because skin is not sterile, your skin needs to be as free of germs as possible.  You can reduce the number of germs on your skin by washing with CHG (chlorahexidine gluconate) soap before surgery.  CHG is an antiseptic cleaner which kills germs and bonds with the skin to continue killing germs even after washing. Please DO NOT use if you have an allergy to CHG or antibacterial soaps.  If your skin becomes reddened/irritated stop using the CHG and inform your nurse when you arrive at Short Stay. Do not shave (including legs and underarms) for at least 48 hours prior to the first CHG shower.  You may shave your face/neck.  Please follow these instructions carefully:  1.  Shower with CHG Soap the night before surgery and the  morning of surgery.  2.  If you choose to wash your hair, wash your hair first as usual with your normal  shampoo.  3.  After you shampoo, rinse your hair and body thoroughly to remove  the shampoo.                             4.  Use CHG as you would any other liquid soap.  You can apply chg directly to the skin and wash.  Gently with a scrungie or clean washcloth.  5.  Apply the CHG Soap to your body ONLY  FROM THE NECK DOWN.   Do   not use on face/ open                           Wound or open sores. Avoid contact with eyes, ears mouth and   genitals (private parts).                       Wash face,  Genitals (private parts) with your normal soap.             6.  Wash thoroughly, paying special attention to the area where your    surgery  will be performed.  7.  Thoroughly rinse your body with warm water from the neck down.  8.  DO NOT shower/wash with your normal soap after using and rinsing off the CHG Soap.                9.  Pat yourself dry with a clean towel.            10.  Wear clean pajamas.            11.  Place clean sheets on your bed the night of your first shower and do not  sleep with pets. Day of Surgery : Do not apply any lotions/deodorants the morning of surgery.  Please wear clean clothes to the hospital/surgery center.  FAILURE TO FOLLOW THESE INSTRUCTIONS MAY RESULT IN THE CANCELLATION OF YOUR SURGERY  PATIENT SIGNATURE_________________________________  NURSE SIGNATURE__________________________________  ________________________________________________________________________   Adam Phenix  An incentive spirometer is a tool that can help keep your lungs clear and active. This tool measures how well you are filling your lungs with each breath. Taking long deep breaths may help reverse or decrease the chance of developing breathing (pulmonary) problems (especially infection) following:  A long period of time when you are unable to move or be active. BEFORE THE PROCEDURE   If the spirometer includes an indicator to show your best effort, your nurse or respiratory therapist will set it to a desired goal.  If possible, sit up straight or lean slightly forward. Try not to slouch.  Hold the incentive spirometer in an upright position. INSTRUCTIONS FOR USE  1. Sit on the edge of your bed if possible, or sit up as far as you can in bed or on a chair. 2. Hold the  incentive spirometer in an upright position. 3. Breathe out normally. 4. Place the mouthpiece in your mouth and seal your lips tightly around it. 5. Breathe in slowly and as deeply as possible, raising the piston or the ball toward the top of the column. 6. Hold your breath for 3-5 seconds or for as long as possible. Allow the piston or ball to fall to the bottom of the column. 7. Remove the mouthpiece from your mouth and breathe out normally. 8. Rest for a few seconds and repeat Steps 1 through 7 at least 10 times every 1-2 hours when you are awake. Take your time and take  a few normal breaths between deep breaths. 9. The spirometer may include an indicator to show your best effort. Use the indicator as a goal to work toward during each repetition. 10. After each set of 10 deep breaths, practice coughing to be sure your lungs are clear. If you have an incision (the cut made at the time of surgery), support your incision when coughing by placing a pillow or rolled up towels firmly against it. Once you are able to get out of bed, walk around indoors and cough well. You may stop using the incentive spirometer when instructed by your caregiver.  RISKS AND COMPLICATIONS  Take your time so you do not get dizzy or light-headed.  If you are in pain, you may need to take or ask for pain medication before doing incentive spirometry. It is harder to take a deep breath if you are having pain. AFTER USE  Rest and breathe slowly and easily.  It can be helpful to keep track of a log of your progress. Your caregiver can provide you with a simple table to help with this. If you are using the spirometer at home, follow these instructions: [place] IF:   You are having difficultly using the spirometer.  You have trouble using the spirometer as often as instructed.  Your pain medication is not giving enough relief while using the spirometer.  You develop fever of 100.5 F (38.1 C) or  higher. SEEK IMMEDIATE MEDICAL CARE IF:   You cough up bloody sputum that had not been present before.  You develop fever of 102 F (38.9 C) or greater.  You develop worsening pain at or near the incision site. MAKE SURE YOU:   Understand these instructions.  Will watch your condition.  Will get help right away if you are not doing well or get worse. Document Released: 05/23/2006 Document Revised: 04/04/2011 Document Reviewed: 07/24/2006 Exit[employer] Patient Information 2014 Exit[employer], [place].   ________________________________________________________________________  WHAT IS A BLOOD TRANSFUSION? Blood Transfusion Information  A transfusion is the replacement of blood or some of its parts. Blood is made up of multiple cells which provide different functions.  Red blood cells carry oxygen and are used for blood loss replacement.  White blood cells fight against infection.  Platelets control bleeding.  Plasma helps clot blood.  Other blood products are available for specialized needs, such as hemophilia or other clotting disorders. BEFORE THE TRANSFUSION  Who gives blood for transfusions?   Healthy volunteers who are fully evaluated to make sure their blood is safe. This is blood bank blood. Transfusion therapy is the safest it has ever been in the practice of medicine. Before blood is taken from a donor, a complete history is taken to make sure that person has no history of diseases nor engages in risky social behavior (examples are intravenous drug use or sexual activity with multiple partners). The donor's travel history is screened to minimize risk of transmitting infections, such as malaria. The donated blood is tested for signs of infectious diseases, such as HIV and hepatitis. The blood is then tested to be sure it is compatible with you in order to minimize the chance of a transfusion reaction. If you or a relative donates blood, this is often done in anticipation of surgery  and is not appropriate for emergency situations. It takes many days to process the donated blood. RISKS AND COMPLICATIONS Although transfusion therapy is very safe and saves many lives, the main dangers of transfusion include:  Getting an infectious disease.  Developing a transfusion reaction. This is an allergic reaction to something in the blood you were given. Every precaution is taken to prevent this. The decision to have a blood transfusion has been considered carefully by your caregiver before blood is given. Blood is not given unless the benefits outweigh the risks. AFTER THE TRANSFUSION  Right after receiving a blood transfusion, you will usually feel much better and more energetic. This is especially true if your red blood cells have gotten low (anemic). The transfusion raises the level of the red blood cells which carry oxygen, and this usually causes an energy increase.  The nurse administering the transfusion will monitor you carefully for complications. HOME CARE INSTRUCTIONS  No special instructions are needed after a transfusion. You may find your energy is better. Speak with your caregiver about any limitations on activity for underlying diseases you may have. SEEK MEDICAL CARE IF:   Your condition is not improving after your transfusion.  You develop redness or irritation at the intravenous (IV) site. SEEK IMMEDIATE MEDICAL CARE IF:  Any of the following symptoms occur over the next 12 hours:  Shaking chills.  You have a temperature by mouth above 102 F (38.9 C), not controlled by medicine.  Chest, back, or muscle pain.  People around you feel you are not acting correctly or are confused.  Shortness of breath or difficulty breathing.  Dizziness and fainting.  You get a rash or develop hives.  You have a decrease in urine output.  Your urine turns a dark color or changes to pink, red, or brown. Any of the following symptoms occur over the next 10  days:  You have a temperature by mouth above 102 F (38.9 C), not controlled by medicine.  Shortness of breath.  Weakness after normal activity.  The white part of the eye turns yellow (jaundice).  You have a decrease in the amount of urine or are urinating less often.  Your urine turns a dark color or changes to pink, red, or brown. Document Released: 01/08/2000 Document Revised: 04/04/2011 Document Reviewed: 08/27/2007 [facility] Patient Information 2014 [place], [place].  _______________________________________________________________________

## 2018-06-13 NOTE — Progress Notes (Signed)
SPOKE W/  [place]     SCREENING SYMPTOMS OF COVID 19:   COUGH--NO  RUNNY NOSE--- NO  SORE THROAT---NO  NASAL CONGESTION----NO  SNEEZING----NO  SHORTNESS OF BREATH---NO  DIFFICULTY BREATHING---NO  TEMP >100.0 -----NO  UNEXPLAINED BODY ACHES------NO  CHILLS -------- NO  HEADACHES ---------NO  LOSS OF SMELL/ TASTE --------NO    HAVE YOU OR ANY FAMILY MEMBER TRAVELLED PAST 14 DAYS OUT OF THE   COUNTY---NO STATE----NO COUNTRY----NO  HAVE YOU OR ANY FAMILY MEMBER BEEN EXPOSED TO ANYONE WITH COVID 19? NO

## 2018-06-14 ENCOUNTER — Encounter (HOSPITAL_COMMUNITY): Payer: Self-pay

## 2018-06-14 ENCOUNTER — Other Ambulatory Visit: Payer: Self-pay

## 2018-06-14 ENCOUNTER — Encounter (HOSPITAL_COMMUNITY)
Admission: RE | Admit: 2018-06-14 | Discharge: 2018-06-14 | Disposition: A | Discharge status: Home / Self Care | Payer: Medicare Other | Source: Ambulatory Visit | Attending: Orthopedic Surgery | Admitting: Orthopedic Surgery

## 2018-06-14 ENCOUNTER — Ambulatory Visit (HOSPITAL_COMMUNITY)
Admission: RE | Admit: 2018-06-14 | Discharge: 2018-06-14 | Disposition: A | Discharge status: Home / Self Care | Payer: Medicare Other | Source: Ambulatory Visit | Attending: Orthopedic Surgery | Admitting: Orthopedic Surgery

## 2018-06-14 DIAGNOSIS — F329 Major depressive disorder, single episode, unspecified: Secondary | ICD-10-CM | POA: Insufficient documentation

## 2018-06-14 DIAGNOSIS — N189 Chronic kidney disease, unspecified: Secondary | ICD-10-CM | POA: Diagnosis not present

## 2018-06-14 DIAGNOSIS — Z01811 Encounter for preprocedural respiratory examination: Secondary | ICD-10-CM

## 2018-06-14 DIAGNOSIS — F17200 Nicotine dependence, unspecified, uncomplicated: Secondary | ICD-10-CM | POA: Diagnosis not present

## 2018-06-14 DIAGNOSIS — K219 Gastro-esophageal reflux disease without esophagitis: Secondary | ICD-10-CM | POA: Insufficient documentation

## 2018-06-14 DIAGNOSIS — F419 Anxiety disorder, unspecified: Secondary | ICD-10-CM | POA: Diagnosis not present

## 2018-06-14 DIAGNOSIS — M879 Osteonecrosis, unspecified: Secondary | ICD-10-CM | POA: Insufficient documentation

## 2018-06-14 DIAGNOSIS — I129 Hypertensive chronic kidney disease with stage 1 through stage 4 chronic kidney disease, or unspecified chronic kidney disease: Secondary | ICD-10-CM | POA: Diagnosis not present

## 2018-06-14 DIAGNOSIS — Z79899 Other long term (current) drug therapy: Secondary | ICD-10-CM | POA: Insufficient documentation

## 2018-06-14 HISTORY — DX: Anxiety disorder, unspecified: F41.9

## 2018-06-14 HISTORY — DX: Personal history of other diseases of the nervous system and sense organs: Z86.69

## 2018-06-14 HISTORY — DX: Gastro-esophageal reflux disease without esophagitis: K21.9

## 2018-06-14 HISTORY — DX: Idiopathic aseptic necrosis of unspecified bone: M87.00

## 2018-06-14 HISTORY — DX: Other vitreous opacities, bilateral: H43.393

## 2018-06-14 HISTORY — DX: Depression, unspecified: F32.A

## 2018-06-14 HISTORY — DX: Migraine, unspecified, not intractable, without status migrainosus: G43.909

## 2018-06-14 LAB — SURGICAL PCR SCREEN
MRSA, PCR: NEGATIVE
Staphylococcus aureus: NEGATIVE

## 2018-06-14 LAB — COMPREHENSIVE METABOLIC PANEL
ALT: 18 U/L (ref 0–44)
AST: 38 U/L (ref 15–41)
Albumin: 3.7 g/dL (ref 3.5–5.0)
Alkaline Phosphatase: 74 U/L (ref 38–126)
Anion gap: 10 (ref 5–15)
BUN: 45 mg/dL — ABNORMAL HIGH (ref 8–23)
CO2: 24 mmol/L (ref 22–32)
Calcium: 9.4 mg/dL (ref 8.9–10.3)
Chloride: 100 mmol/L (ref 98–111)
Creatinine, Ser: 4.17 mg/dL — ABNORMAL HIGH (ref 0.44–1.00)
GFR calc Af Amer: 12 mL/min — ABNORMAL LOW (ref >60)
GFR calc non Af Amer: 10 mL/min — ABNORMAL LOW (ref >60)
Glucose, Bld: 96 mg/dL (ref 70–99)
Potassium: 3.5 mmol/L (ref 3.5–5.1)
Sodium: 134 mmol/L — ABNORMAL LOW (ref 135–145)
Total Bilirubin: 0.3 mg/dL (ref 0.3–1.2)
Total Protein: 7.3 g/dL (ref 6.5–8.1)

## 2018-06-14 LAB — CBC WITH DIFFERENTIAL/PLATELET
Abs Immature Granulocytes: 0.04 10*3/uL (ref 0.00–0.07)
Basophils Absolute: 0.1 10*3/uL (ref 0.0–0.1)
Basophils Relative: 1 %
Eosinophils Absolute: 0.2 10*3/uL (ref 0.0–0.5)
Eosinophils Relative: 2 %
HCT: 47.9 % — ABNORMAL HIGH (ref 36.0–46.0)
Hemoglobin: 15.8 g/dL — ABNORMAL HIGH (ref 12.0–15.0)
Immature Granulocytes: 0 %
Lymphocytes Relative: 17 %
Lymphs Abs: 1.8 10*3/uL (ref 0.7–4.0)
MCH: 28.6 pg (ref 26.0–34.0)
MCHC: 33 g/dL (ref 30.0–36.0)
MCV: 86.6 fL (ref 80.0–100.0)
Monocytes Absolute: 1.1 10*3/uL — ABNORMAL HIGH (ref 0.1–1.0)
Monocytes Relative: 10 %
Neutro Abs: 7.4 10*3/uL (ref 1.7–7.7)
Neutrophils Relative %: 70 %
Platelets: 218 10*3/uL (ref 150–400)
RBC: 5.53 MIL/uL — ABNORMAL HIGH (ref 3.87–5.11)
RDW: 18.9 % — ABNORMAL HIGH (ref 11.5–15.5)
WBC: 10.7 10*3/uL — ABNORMAL HIGH (ref 4.0–10.5)
nRBC: 0 % (ref 0.0–0.2)

## 2018-06-14 LAB — PROTIME-INR
INR: 1 (ref 0.8–1.2)
Prothrombin Time: 13.4 seconds (ref 11.4–15.2)

## 2018-06-14 LAB — APTT: aPTT: 28 seconds (ref 24–36)

## 2018-06-14 LAB — ABO/RH: ABO/RH(D): B NEG

## 2018-06-15 ENCOUNTER — Encounter (HOSPITAL_COMMUNITY): Payer: Self-pay | Admitting: Physician Assistant

## 2018-06-15 NOTE — Progress Notes (Addendum)
Anesthesia Chart Review   Case:  [id number] Date/Time:  06/22/18 0700   Procedure:  TOTAL HIP ARTHROPLASTY ANTERIOR APPROACH (Left )   Anesthesia type:  Spinal   Pre-op diagnosis:  AVASCULAR NECROSIS LEFT HIP   Location:  [facility] ROOM 07 / [facility]   Surgeon:  Dorna Leitz, MD      DISCUSSION: 68 yo current every day smoker with h/o HTN, migraines, GERD, depression, anxiety, CKD, AVN left hip scheduled for above procedure 06/22/18 with Dr. Dorna Leitz.    Pt with elevated creatinine of 4.17 at PAT visit 06/14/2018.  Last labs requested.  04/13/2018 Creatinine 1.39.  Message left with PCP, Dr. Kenton Kingfisher, regarding CKD.  Dr. Berenice Primas made aware.    Addendum:  PCP called.  Pt will hold ibuprofen and drink lots of fluids.  She is scheduled for recheck with PCP 06/19/2018.  If no improvement PCP will refer to nephrology.   VS: BP (!) 104/57 (BP Location: Left Arm)   Pulse 70   Temp 37.1 C (Oral)   Resp 18   Ht 5\' 1"  (1.549 m)   Wt 54.2 kg   SpO2 99%   BMI 22.57 kg/m   PROVIDERS: [name], MD is PCP    LABS: Labs reviewed: Acceptable for surgery. (all labs ordered are listed, but only abnormal results are displayed)  Labs Reviewed  CBC WITH DIFFERENTIAL/PLATELET - Abnormal; Notable for the following components:      Result Value   WBC 10.7 (*)    RBC 5.53 (*)    Hemoglobin 15.8 (*)    HCT 47.9 (*)    RDW 18.9 (*)    Monocytes Absolute 1.1 (*)    All other components within normal limits  COMPREHENSIVE METABOLIC PANEL - Abnormal; Notable for the following components:   Sodium 134 (*)    BUN 45 (*)    Creatinine, Ser 4.17 (*)    GFR calc non Af Amer 10 (*)    GFR calc Af Amer 12 (*)    All other components within normal limits  SURGICAL PCR SCREEN  APTT  PROTIME-INR  TYPE AND SCREEN  ABO/RH     IMAGES: Chest Xray 06/14/2018 FINDINGS: Heart and mediastinal contours are within normal limits. No focal opacities or effusions. No acute bony abnormality.  IMPRESSION: No  active cardiopulmonary disease.  EKG: 06/14/2018 Rate 54 bpm Sinus bradycaria T wave abnormality, consider inferior ischemia T wave abnormality, consider anterior ischemia  CV: Stress Test 10/23/15  Blood pressure demonstrated a normal response to exercise.  There was no ST segment deviation noted during stress.  No T wave inversion was noted during stress. Past Medical History:  Diagnosis Date  . 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 Anxiety   . Arthritis   . 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 Benign essential HTN 10/23/2015  . 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 DDD (degenerative disc disease), lumbar 01/10/2011   Noted on MRI  . Depression   . DJD (degenerative joint disease)   . GERD (gastroesophageal reflux disease)   . History of cataract    Bilateral  . Hypercholesterolemia   . Hypertension   . Labial tear 02/25/2018   Noted on MRI: smal Left anterior   . Lumbar spondylosis 01/10/2011   Noted on MRI  . Migraines   . Renal cyst 11/17/2015   Notedon [place] Abd: Simple, Right  . Spinal stenosis 09/06/2016  Noted on MRI: Moderate-Severe  . Vitreous floater, bilateral     Past Surgical History:  Procedure Laterality Date  . COLONOSCOPY    . PARTIAL HYSTERECTOMY    . ROTATOR CUFF REPAIR Left   . TONSILLECTOMY      MEDICATIONS: . amLODipine (NORVASC) 5 MG tablet  . GARLIC PO  . Liniments (SALONPAS PAIN RELIEF PATCH EX)  . lisinopril-hydrochlorothiazide (PRINZIDE,ZESTORETIC) 20-25 MG tablet  . loratadine (CLARITIN) 10 MG tablet  . Oxycodone HCl 20 MG TABS  . pregabalin (LYRICA) 75 MG capsule  . traMADol (ULTRAM) 50 MG tablet  . traZODone (DESYREL) 50 MG tablet   No current facility-administered medications for this encounter.     Maia Plan [facility] Pre-Surgical Testing [phone] 06/15/18 4:14 PM

## 2018-06-19 ENCOUNTER — Other Ambulatory Visit (HOSPITAL_COMMUNITY): Payer: Medicare Other

## 2018-06-19 ENCOUNTER — Other Ambulatory Visit: Payer: Self-pay | Admitting: Orthopedic Surgery

## 2018-06-20 ENCOUNTER — Other Ambulatory Visit (HOSPITAL_COMMUNITY)
Admission: RE | Admit: 2018-06-20 | Discharge: 2018-06-20 | Disposition: A | Discharge status: Home / Self Care | Payer: Medicare Other | Source: Ambulatory Visit | Attending: Orthopedic Surgery | Admitting: Orthopedic Surgery

## 2018-06-20 DIAGNOSIS — Z1159 Encounter for screening for other viral diseases: Secondary | ICD-10-CM | POA: Insufficient documentation

## 2018-06-20 LAB — SARS CORONAVIRUS 2 BY RT PCR (HOSPITAL ORDER, PERFORMED IN ~~LOC~~ HOSPITAL LAB): SARS Coronavirus 2: NEGATIVE

## 2018-06-21 MED ORDER — BUPIVACAINE LIPOSOME 1.3 % IJ SUSP
10.0000 mL | Freq: Once | INTRAMUSCULAR | Status: DC
  Filled 2018-06-21: qty 10

## 2018-06-21 NOTE — Progress Notes (Signed)
  Left message to call back if any symptoms of covid 19   SPOKE W/  _     SCREENING SYMPTOMS OF COVID 19:   COUGH--  RUNNY NOSE---   SORE THROAT---  NASAL CONGESTION----  SNEEZING----  SHORTNESS OF BREATH---  DIFFICULTY BREATHING---  TEMP >100.0 -----  UNEXPLAINED BODY ACHES------  CHILLS --------   HEADACHES ---------  LOSS OF SMELL/ TASTE --------    HAVE YOU OR ANY FAMILY MEMBER TRAVELLED PAST 14 DAYS OUT OF THE   COUNTY--- STATE---- COUNTRY----  HAVE YOU OR ANY FAMILY MEMBER BEEN EXPOSED TO ANYONE WITH COVID 19?

## 2018-06-22 ENCOUNTER — Ambulatory Visit (HOSPITAL_COMMUNITY): Payer: Medicare Other

## 2018-06-22 ENCOUNTER — Encounter (HOSPITAL_COMMUNITY): Admission: RE | Payer: Self-pay | Source: Home / Self Care

## 2018-06-22 ENCOUNTER — Other Ambulatory Visit: Payer: Self-pay

## 2018-06-22 ENCOUNTER — Ambulatory Visit (HOSPITAL_COMMUNITY): Admission: RE | Admit: 2018-06-22 | Payer: Medicare Other | Source: Home / Self Care | Admitting: Orthopedic Surgery

## 2018-06-22 ENCOUNTER — Encounter (HOSPITAL_COMMUNITY)
Admission: RE | Disposition: A | Discharge status: Home / Self Care | Payer: Self-pay | Source: Home / Self Care | Attending: Orthopedic Surgery

## 2018-06-22 ENCOUNTER — Ambulatory Visit (HOSPITAL_COMMUNITY)
Admission: RE | Admit: 2018-06-22 | Discharge: 2018-06-23 | Disposition: A | Discharge status: Home / Self Care | Payer: Medicare Other | Attending: Orthopedic Surgery | Admitting: Orthopedic Surgery

## 2018-06-22 ENCOUNTER — Ambulatory Visit (HOSPITAL_COMMUNITY): Payer: Medicare Other | Admitting: Anesthesiology

## 2018-06-22 ENCOUNTER — Encounter (HOSPITAL_COMMUNITY): Payer: Self-pay

## 2018-06-22 DIAGNOSIS — F419 Anxiety disorder, unspecified: Secondary | ICD-10-CM | POA: Diagnosis not present

## 2018-06-22 DIAGNOSIS — I10 Essential (primary) hypertension: Secondary | ICD-10-CM | POA: Diagnosis not present

## 2018-06-22 DIAGNOSIS — Z79899 Other long term (current) drug therapy: Secondary | ICD-10-CM | POA: Diagnosis not present

## 2018-06-22 DIAGNOSIS — F329 Major depressive disorder, single episode, unspecified: Secondary | ICD-10-CM | POA: Diagnosis not present

## 2018-06-22 DIAGNOSIS — M1612 Unilateral primary osteoarthritis, left hip: Secondary | ICD-10-CM | POA: Diagnosis not present

## 2018-06-22 DIAGNOSIS — F17290 Nicotine dependence, other tobacco product, uncomplicated: Secondary | ICD-10-CM | POA: Insufficient documentation

## 2018-06-22 DIAGNOSIS — Z79891 Long term (current) use of opiate analgesic: Secondary | ICD-10-CM | POA: Diagnosis not present

## 2018-06-22 DIAGNOSIS — Z419 Encounter for procedure for purposes other than remedying health state, unspecified: Secondary | ICD-10-CM

## 2018-06-22 HISTORY — PX: TOTAL HIP ARTHROPLASTY: SHX124

## 2018-06-22 LAB — TYPE AND SCREEN
ABO/RH(D): B NEG
Antibody Screen: NEGATIVE

## 2018-06-22 LAB — URINALYSIS, ROUTINE W REFLEX MICROSCOPIC
Bilirubin Urine: NEGATIVE
Glucose, UA: NEGATIVE mg/dL
Hgb urine dipstick: NEGATIVE
Ketones, ur: 5 mg/dL — AB
Leukocytes,Ua: NEGATIVE
Nitrite: NEGATIVE
Protein, ur: 100 mg/dL — AB
Specific Gravity, Urine: 1.012 (ref 1.005–1.030)
pH: 5 (ref 5.0–8.0)

## 2018-06-22 LAB — BASIC METABOLIC PANEL
Anion gap: 9 (ref 5–15)
BUN: 27 mg/dL — ABNORMAL HIGH (ref 8–23)
CO2: 25 mmol/L (ref 22–32)
Calcium: 9.5 mg/dL (ref 8.9–10.3)
Chloride: 95 mmol/L — ABNORMAL LOW (ref 98–111)
Creatinine, Ser: 2.26 mg/dL — ABNORMAL HIGH (ref 0.44–1.00)
GFR calc Af Amer: 25 mL/min — ABNORMAL LOW (ref >60)
GFR calc non Af Amer: 21 mL/min — ABNORMAL LOW (ref >60)
Glucose, Bld: 94 mg/dL (ref 70–99)
Potassium: 3.3 mmol/L — ABNORMAL LOW (ref 3.5–5.1)
Sodium: 129 mmol/L — ABNORMAL LOW (ref 135–145)

## 2018-06-22 SURGERY — ARTHROPLASTY, HIP, TOTAL, ANTERIOR APPROACH
Anesthesia: Spinal | Laterality: Left

## 2018-06-22 SURGERY — ARTHROPLASTY, HIP, TOTAL, ANTERIOR APPROACH
Anesthesia: General | Laterality: Left

## 2018-06-22 MED ORDER — SODIUM CHLORIDE 0.9% FLUSH
INTRAVENOUS | Status: DC | PRN
  Administered 2018-06-22: 20 mL via INTRAVENOUS

## 2018-06-22 MED ORDER — METHOCARBAMOL 500 MG PO TABS
500.0000 mg | ORAL_TABLET | Freq: Four times a day (QID) | ORAL | Status: DC | PRN
  Administered 2018-06-23: 500 mg via ORAL
  Filled 2018-06-22: qty 1

## 2018-06-22 MED ORDER — ASPIRIN EC 325 MG PO TBEC: 325.0000 mg | DELAYED_RELEASE_TABLET | Freq: Two times a day (BID) | ORAL | 0 refills | Status: DC

## 2018-06-22 MED ORDER — OXYCODONE-ACETAMINOPHEN 5-325 MG PO TABS: 1.0000 | ORAL_TABLET | Freq: Four times a day (QID) | ORAL | 0 refills | Status: DC | PRN

## 2018-06-22 MED ORDER — ROCURONIUM BROMIDE 10 MG/ML (PF) SYRINGE
PREFILLED_SYRINGE | INTRAVENOUS | Status: DC | PRN
  Administered 2018-06-22: 30 mg via INTRAVENOUS

## 2018-06-22 MED ORDER — MEPERIDINE HCL 50 MG/ML IJ SOLN
6.2500 mg | INTRAMUSCULAR | Status: DC | PRN
  Administered 2018-06-22: 6.25 mg via INTRAVENOUS

## 2018-06-22 MED ORDER — ACETAMINOPHEN 325 MG PO TABS: 325.0000 mg | ORAL_TABLET | Freq: Four times a day (QID) | ORAL | Status: DC | PRN

## 2018-06-22 MED ORDER — ASPIRIN EC 325 MG PO TBEC
325.0000 mg | DELAYED_RELEASE_TABLET | Freq: Two times a day (BID) | ORAL | Status: DC
  Administered 2018-06-23: 09:00:00 325 mg via ORAL
  Filled 2018-06-22: qty 1

## 2018-06-22 MED ORDER — MIDAZOLAM HCL 2 MG/2ML IJ SOLN
INTRAMUSCULAR | Status: DC | PRN
  Administered 2018-06-22: 1 mg via INTRAVENOUS

## 2018-06-22 MED ORDER — TRAZODONE HCL 50 MG PO TABS
50.0000 mg | ORAL_TABLET | Freq: Every day | ORAL | Status: DC
  Administered 2018-06-22: 50 mg via ORAL
  Filled 2018-06-22: qty 1

## 2018-06-22 MED ORDER — BUPIVACAINE LIPOSOME 1.3 % IJ SUSP
INTRAMUSCULAR | Status: DC | PRN
  Administered 2018-06-22: 10 mL

## 2018-06-22 MED ORDER — ONDANSETRON HCL 4 MG PO TABS: 4.0000 mg | ORAL_TABLET | Freq: Four times a day (QID) | ORAL | Status: DC | PRN

## 2018-06-22 MED ORDER — SODIUM CHLORIDE 0.9 % IV SOLN
INTRAVENOUS | Status: DC
  Administered 2018-06-22 – 2018-06-23 (×2): via INTRAVENOUS

## 2018-06-22 MED ORDER — PROPOFOL 10 MG/ML IV BOLUS
INTRAVENOUS | Status: DC | PRN
  Administered 2018-06-22: 90 mg via INTRAVENOUS

## 2018-06-22 MED ORDER — SUGAMMADEX SODIUM 200 MG/2ML IV SOLN
INTRAVENOUS | Status: DC | PRN
  Administered 2018-06-22: 110 mg via INTRAVENOUS

## 2018-06-22 MED ORDER — SODIUM CHLORIDE 0.9 % IR SOLN
Status: DC | PRN
  Administered 2018-06-22: 1000 mL

## 2018-06-22 MED ORDER — TRANEXAMIC ACID-NACL 1000-0.7 MG/100ML-% IV SOLN
1000.0000 mg | INTRAVENOUS | Status: AC
  Administered 2018-06-22: 11:00:00 1000 mg via INTRAVENOUS
  Filled 2018-06-22: qty 100

## 2018-06-22 MED ORDER — DEXAMETHASONE SODIUM PHOSPHATE 10 MG/ML IJ SOLN
INTRAMUSCULAR | Status: DC | PRN
  Administered 2018-06-22: 5 mg via INTRAVENOUS

## 2018-06-22 MED ORDER — AMLODIPINE BESYLATE 5 MG PO TABS
5.0000 mg | ORAL_TABLET | Freq: Every day | ORAL | Status: DC
  Administered 2018-06-23: 5 mg via ORAL
  Filled 2018-06-22: qty 1

## 2018-06-22 MED ORDER — CEFAZOLIN SODIUM-DEXTROSE 2-4 GM/100ML-% IV SOLN
2.0000 g | INTRAVENOUS | Status: AC
  Administered 2018-06-22: 10:00:00 2 g via INTRAVENOUS
  Filled 2018-06-22: qty 100

## 2018-06-22 MED ORDER — METHOCARBAMOL 500 MG IVPB - SIMPLE MED
500.0000 mg | Freq: Four times a day (QID) | INTRAVENOUS | Status: DC | PRN
  Administered 2018-06-22: 500 mg via INTRAVENOUS
  Filled 2018-06-22: qty 50

## 2018-06-22 MED ORDER — OXYCODONE HCL 5 MG PO TABS
5.0000 mg | ORAL_TABLET | Freq: Four times a day (QID) | ORAL | Status: DC | PRN
  Administered 2018-06-22 – 2018-06-23 (×2): 10 mg via ORAL
  Filled 2018-06-22 (×2): qty 2

## 2018-06-22 MED ORDER — POVIDONE-IODINE 10 % EX SWAB
2.0000 "application " | Freq: Once | CUTANEOUS | Status: AC
  Administered 2018-06-22: 2 via TOPICAL

## 2018-06-22 MED ORDER — ONDANSETRON HCL 4 MG/2ML IJ SOLN
INTRAMUSCULAR | Status: DC | PRN
  Administered 2018-06-22: 4 mg via INTRAVENOUS

## 2018-06-22 MED ORDER — SUCCINYLCHOLINE CHLORIDE 200 MG/10ML IV SOSY
PREFILLED_SYRINGE | INTRAVENOUS | Status: DC | PRN
  Administered 2018-06-22: 80 mg via INTRAVENOUS

## 2018-06-22 MED ORDER — MAGNESIUM CITRATE PO SOLN: 1.0000 | Freq: Once | ORAL | Status: DC | PRN

## 2018-06-22 MED ORDER — DEXAMETHASONE SODIUM PHOSPHATE 10 MG/ML IJ SOLN
10.0000 mg | Freq: Two times a day (BID) | INTRAMUSCULAR | Status: AC
  Administered 2018-06-22 – 2018-06-23 (×3): 10 mg via INTRAVENOUS
  Filled 2018-06-22 (×3): qty 1

## 2018-06-22 MED ORDER — METOCLOPRAMIDE HCL 5 MG/ML IJ SOLN: 10.0000 mg | Freq: Once | INTRAMUSCULAR | Status: DC | PRN

## 2018-06-22 MED ORDER — HYDROMORPHONE HCL 1 MG/ML IJ SOLN
INTRAMUSCULAR | Status: AC
  Filled 2018-06-22: qty 2

## 2018-06-22 MED ORDER — ALUM & MAG HYDROXIDE-SIMETH 200-200-20 MG/5ML PO SUSP: 30.0000 mL | ORAL | Status: DC | PRN

## 2018-06-22 MED ORDER — LISINOPRIL 20 MG PO TABS
20.0000 mg | ORAL_TABLET | Freq: Every day | ORAL | Status: DC
  Administered 2018-06-23: 20 mg via ORAL
  Filled 2018-06-22: qty 1

## 2018-06-22 MED ORDER — HYDROMORPHONE HCL 1 MG/ML IJ SOLN
INTRAMUSCULAR | Status: DC | PRN
  Administered 2018-06-22: .2 mg via INTRAVENOUS
  Administered 2018-06-22 (×4): .4 mg via INTRAVENOUS
  Administered 2018-06-22: .2 mg via INTRAVENOUS

## 2018-06-22 MED ORDER — HYDROCHLOROTHIAZIDE 25 MG PO TABS
25.0000 mg | ORAL_TABLET | Freq: Every day | ORAL | Status: DC
  Administered 2018-06-23: 25 mg via ORAL
  Filled 2018-06-22: qty 1

## 2018-06-22 MED ORDER — WATER FOR IRRIGATION, STERILE IR SOLN
Status: DC | PRN
  Administered 2018-06-22 (×2): 1000 mL

## 2018-06-22 MED ORDER — HYDROMORPHONE HCL 1 MG/ML IJ SOLN: 0.5000 mg | INTRAMUSCULAR | Status: DC | PRN

## 2018-06-22 MED ORDER — GABAPENTIN 300 MG PO CAPS
300.0000 mg | ORAL_CAPSULE | Freq: Two times a day (BID) | ORAL | Status: DC
  Administered 2018-06-22 – 2018-06-23 (×3): 300 mg via ORAL
  Filled 2018-06-22 (×3): qty 1

## 2018-06-22 MED ORDER — FENTANYL CITRATE (PF) 100 MCG/2ML IJ SOLN
INTRAMUSCULAR | Status: AC
  Filled 2018-06-22: qty 4

## 2018-06-22 MED ORDER — DOCUSATE SODIUM 100 MG PO CAPS: 100.0000 mg | ORAL_CAPSULE | Freq: Two times a day (BID) | ORAL | 0 refills | Status: DC

## 2018-06-22 MED ORDER — TIZANIDINE HCL 2 MG PO TABS: 2.0000 mg | ORAL_TABLET | Freq: Three times a day (TID) | ORAL | 0 refills | Status: DC | PRN

## 2018-06-22 MED ORDER — TRANEXAMIC ACID-NACL 1000-0.7 MG/100ML-% IV SOLN
1000.0000 mg | Freq: Once | INTRAVENOUS | Status: AC
  Administered 2018-06-22: 1000 mg via INTRAVENOUS
  Filled 2018-06-22: qty 100

## 2018-06-22 MED ORDER — FENTANYL CITRATE (PF) 100 MCG/2ML IJ SOLN
25.0000 ug | INTRAMUSCULAR | Status: DC | PRN
  Administered 2018-06-22 (×2): 50 ug via INTRAVENOUS

## 2018-06-22 MED ORDER — KETAMINE HCL 10 MG/ML IJ SOLN
INTRAMUSCULAR | Status: DC | PRN
  Administered 2018-06-22: 10 mg via INTRAVENOUS
  Administered 2018-06-22: 30 mg via INTRAVENOUS
  Administered 2018-06-22: 10 mg via INTRAVENOUS

## 2018-06-22 MED ORDER — HYDROMORPHONE HCL 1 MG/ML IJ SOLN
0.2500 mg | INTRAMUSCULAR | Status: DC | PRN
  Administered 2018-06-22 (×4): 0.5 mg via INTRAVENOUS

## 2018-06-22 MED ORDER — MEPERIDINE HCL 50 MG/ML IJ SOLN
INTRAMUSCULAR | Status: AC
  Filled 2018-06-22: qty 1

## 2018-06-22 MED ORDER — FENTANYL CITRATE (PF) 100 MCG/2ML IJ SOLN
INTRAMUSCULAR | Status: DC | PRN
  Administered 2018-06-22 (×3): 50 ug via INTRAVENOUS
  Administered 2018-06-22: 100 ug via INTRAVENOUS
  Administered 2018-06-22: 50 ug via INTRAVENOUS

## 2018-06-22 MED ORDER — OXYCODONE HCL 5 MG PO TABS
20.0000 mg | ORAL_TABLET | Freq: Four times a day (QID) | ORAL | Status: DC
  Administered 2018-06-22 – 2018-06-23 (×3): 20 mg via ORAL
  Filled 2018-06-22 (×3): qty 4

## 2018-06-22 MED ORDER — OXYCODONE HCL 20 MG PO TABS: 1.0000 | ORAL_TABLET | Freq: Four times a day (QID) | ORAL | Status: DC

## 2018-06-22 MED ORDER — METHOCARBAMOL 500 MG IVPB - SIMPLE MED
INTRAVENOUS | Status: AC
  Filled 2018-06-22: qty 50

## 2018-06-22 MED ORDER — DOCUSATE SODIUM 100 MG PO CAPS
100.0000 mg | ORAL_CAPSULE | Freq: Two times a day (BID) | ORAL | Status: DC
  Administered 2018-06-22 – 2018-06-23 (×2): 100 mg via ORAL
  Filled 2018-06-22 (×2): qty 1

## 2018-06-22 MED ORDER — POLYETHYLENE GLYCOL 3350 17 G PO PACK: 17.0000 g | PACK | Freq: Every day | ORAL | Status: DC | PRN

## 2018-06-22 MED ORDER — BUPIVACAINE-EPINEPHRINE 0.5% -1:200000 IJ SOLN
INTRAMUSCULAR | Status: DC | PRN
  Administered 2018-06-22: 30 mL

## 2018-06-22 MED ORDER — CEFAZOLIN SODIUM-DEXTROSE 2-4 GM/100ML-% IV SOLN
2.0000 g | Freq: Four times a day (QID) | INTRAVENOUS | Status: AC
  Administered 2018-06-22 (×2): 2 g via INTRAVENOUS
  Filled 2018-06-22 (×2): qty 100

## 2018-06-22 MED ORDER — ONDANSETRON HCL 4 MG/2ML IJ SOLN: 4.0000 mg | Freq: Four times a day (QID) | INTRAMUSCULAR | Status: DC | PRN

## 2018-06-22 MED ORDER — BISACODYL 5 MG PO TBEC: 5.0000 mg | DELAYED_RELEASE_TABLET | Freq: Every day | ORAL | Status: DC | PRN

## 2018-06-22 MED ORDER — LISINOPRIL-HYDROCHLOROTHIAZIDE 20-25 MG PO TABS: 1.0000 | ORAL_TABLET | Freq: Every day | ORAL | Status: DC

## 2018-06-22 MED ORDER — LACTATED RINGERS IV SOLN
INTRAVENOUS | Status: DC
  Administered 2018-06-22 (×2): via INTRAVENOUS

## 2018-06-22 MED ORDER — LIDOCAINE 2% (20 MG/ML) 5 ML SYRINGE
INTRAMUSCULAR | Status: DC | PRN
  Administered 2018-06-22: 60 mg via INTRAVENOUS

## 2018-06-22 MED ORDER — DIPHENHYDRAMINE HCL 12.5 MG/5ML PO ELIX: 12.5000 mg | ORAL_SOLUTION | ORAL | Status: DC | PRN

## 2018-06-22 MED ORDER — CHLORHEXIDINE GLUCONATE 4 % EX LIQD: 60.0000 mL | Freq: Once | CUTANEOUS | Status: DC

## 2018-06-22 SURGICAL SUPPLY — 40 items
BAG ZIPLOCK 12X15 (MISCELLANEOUS) IMPLANT
BENZOIN TINCTURE PRP APPL 2/3 (GAUZE/BANDAGES/DRESSINGS) ×3 IMPLANT
BLADE SAW SGTL 18X1.27X75 (BLADE) ×2 IMPLANT
BLADE SAW SGTL 18X1.27X75MM (BLADE) ×1
BLADE SURG SZ10 CARB STEEL (BLADE) ×6 IMPLANT
CLOSURE WOUND 1/2 X4 (GAUZE/BANDAGES/DRESSINGS) ×1
COVER PERINEAL POST (MISCELLANEOUS) ×3 IMPLANT
COVER SURGICAL LIGHT HANDLE (MISCELLANEOUS) ×3 IMPLANT
COVER WAND RF STERILE (DRAPES) IMPLANT
CUP ACETBLR 48 OD 100 SERIES (Hips) ×3 IMPLANT
DRAPE STERI IOBAN 125X83 (DRAPES) ×3 IMPLANT
DRAPE U-SHAPE 47X51 STRL (DRAPES) ×6 IMPLANT
DRESSING AQUACEL AG SP 3.5X6 (GAUZE/BANDAGES/DRESSINGS) ×1 IMPLANT
DRSG AQUACEL AG ADV 3.5X 6 (GAUZE/BANDAGES/DRESSINGS) ×3 IMPLANT
DRSG AQUACEL AG SP 3.5X6 (GAUZE/BANDAGES/DRESSINGS) ×3
DURAPREP 26ML APPLICATOR (WOUND CARE) ×3 IMPLANT
ELECT BLADE TIP CTD 4 INCH (ELECTRODE) ×3 IMPLANT
ELECT REM PT RETURN 15FT ADLT (MISCELLANEOUS) ×3 IMPLANT
ELIMINATOR HOLE APEX DEPUY (Hips) ×3 IMPLANT
GAUZE XEROFORM 1X8 LF (GAUZE/BANDAGES/DRESSINGS) IMPLANT
GLOVE BIOGEL PI IND STRL 8 (GLOVE) ×2 IMPLANT
GLOVE BIOGEL PI INDICATOR 8 (GLOVE) ×4
GLOVE ECLIPSE 7.5 STRL STRAW (GLOVE) ×6 IMPLANT
GOWN STRL REUS W/TWL XL LVL3 (GOWN DISPOSABLE) ×6 IMPLANT
HEAD FEMORAL 32 CERAMIC (Hips) ×3 IMPLANT
HOLDER FOLEY CATH W/STRAP (MISCELLANEOUS) ×3 IMPLANT
HOOD PEEL AWAY FLYTE STAYCOOL (MISCELLANEOUS) ×6 IMPLANT
KIT TURNOVER KIT A (KITS) IMPLANT
LINER ACET 32X48 (Liner) ×3 IMPLANT
NEEDLE HYPO 22GX1.5 SAFETY (NEEDLE) ×3 IMPLANT
PACK ANTERIOR HIP CUSTOM (KITS) ×3 IMPLANT
STAPLER VISISTAT 35W (STAPLE) IMPLANT
STEM CORAIL KA11 (Stem) ×3 IMPLANT
STRIP CLOSURE SKIN 1/2X4 (GAUZE/BANDAGES/DRESSINGS) ×2 IMPLANT
SUT ETHIBOND NAB CT1 #1 30IN (SUTURE) ×6 IMPLANT
SUT MNCRL AB 3-0 PS2 18 (SUTURE) IMPLANT
SUT VIC AB 0 CT1 36 (SUTURE) ×3 IMPLANT
SUT VIC AB 1 CT1 36 (SUTURE) ×3 IMPLANT
TRAY FOLEY MTR SLVR 16FR STAT (SET/KITS/TRAYS/PACK) ×3 IMPLANT
YANKAUER SUCT BULB TIP 10FT TU (MISCELLANEOUS) ×3 IMPLANT

## 2018-06-22 NOTE — Discharge Instructions (Signed)

## 2018-06-22 NOTE — H&P (Signed)
TOTAL HIP ADMISSION H&P  Patient is admitted for left total hip arthroplasty.  Subjective:  Chief Complaint: left hip pain  HPI: Judy Chang, 68 y.o. female, has a history of pain and functional disability in the left hip(s) due to arthritis and patient has failed non-surgical conservative treatments for greater than 12 weeks to include NSAID's and/or analgesics, corticosteriod injections, flexibility and strengthening excercises and activity modification.  Onset of symptoms was abrupt starting 1 years ago with rapidlly worsening course since that time.The patient noted no past surgery on the left hip(s).  Patient currently rates pain in the left hip at 9 out of 10 with activity. Patient has night pain, worsening of pain with activity and weight bearing, trendelenberg gait, pain that interfers with activities of daily living, pain with passive range of motion and joint swelling. Patient has evidence of joint space narrowing by imaging studies. This condition presents safety issues increasing the risk of falls. This patient has had failure of all reasonable conservative care.  There is no current active infection.  Patient Active Problem List   Diagnosis Date Noted  . Benign essential HTN 10/23/2015  . 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 Chest pain, neg MI, neg for pul. embolus, normal ETT 10/22/2015   Past Medical History:  Diagnosis Date  . 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 Anxiety   . Arthritis   . 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 Benign essential HTN 10/23/2015  . 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 DDD (degenerative disc disease), lumbar 01/10/2011   Noted on MRI  . Depression   . DJD (degenerative joint disease)   . GERD (gastroesophageal reflux disease)   . History of cataract    Bilateral  . Hypercholesterolemia   . Hypertension   . Labial tear 02/25/2018   Noted on MRI: smal Left anterior   .  Lumbar spondylosis 01/10/2011   Noted on MRI  . Migraines   . Renal cyst 11/17/2015   Notedon [place] Abd: Simple, Right  . Spinal stenosis 09/06/2016   Noted on MRI: Moderate-Severe  . Vitreous floater, bilateral     Past Surgical History:  Procedure Laterality Date  . COLONOSCOPY    . PARTIAL HYSTERECTOMY    . ROTATOR CUFF REPAIR Left   . TONSILLECTOMY      Current Facility-Administered Medications  Medication Dose Route Frequency Provider Last Rate Last Dose  . bupivacaine liposome (EXPAREL) 1.3 % injection 133 mg  10 mL Infiltration Once Dorna Leitz, MD       Current Outpatient Medications  Medication Sig Dispense Refill Last Dose  . amLODipine (NORVASC) 5 MG tablet Take 5 mg by mouth daily.  5 10/22/2015 at Unknown time  . GARLIC PO Take 1 tablet by mouth 2 (two) times daily.   10/22/2015 at Unknown time  . Liniments (SALONPAS PAIN RELIEF PATCH EX) Apply 1 patch topically daily as needed (pain).     Marland Kitchen lisinopril-hydrochlorothiazide (PRINZIDE,ZESTORETIC) 20-25 MG tablet Take 1 tablet by mouth daily.  2 10/22/2015 at Unknown time  . loratadine (CLARITIN) 10 MG tablet Take 10 mg by mouth daily as needed for allergies.     . Oxycodone HCl 20 MG TABS Take 1 tablet by mouth 4 (four) times daily.   0 10/22/2015 at Unknown time  . pregabalin (LYRICA) 75 MG capsule Take 75 mg by mouth 2 (two) times daily.     Marland Kitchen  traMADol (ULTRAM) 50 MG tablet Take 50 mg by mouth 2 (two) times daily as needed for moderate pain.   2 Past Week at Unknown time  . traZODone (DESYREL) 50 MG tablet Take 50 mg by mouth at bedtime.  6 10/21/2015 at Unknown time   Allergies  Allergen Reactions  . Nsaids Rash    Social History   Tobacco Use  . Smoking status: Current Every Day Smoker    Types: Cigars  . Smokeless tobacco: Never Used  . Tobacco comment: off and on  Substance Use Topics  . Alcohol use: Yes    Comment: every few days, beer    No family history on file.   ROS ROS: I have reviewed the patient's  review of systems thoroughly and there are no positive responses as relates to the HPI. Objective:  Physical Exam  Vital signs in last 24 hours:   Well-developed well-nourished patient in no acute distress. Alert and oriented x3 HEENT:within normal limits Cardiac: Regular rate and rhythm Pulmonary: Lungs clear to auscultation Abdomen: Soft and nontender.  Normal active bowel sounds  Musculoskeletal: (left hip: Painful range of motion.  Limited range of motion.  Neurovascular intact distally. Labs: Recent Results (from the past 2160 hour(s))  Surgical pcr screen     Status: None   Collection Time: 06/14/18  8:54 AM  Result Value Ref Range   MRSA, PCR NEGATIVE NEGATIVE   Staphylococcus aureus NEGATIVE NEGATIVE    Comment: (NOTE) The Xpert SA Assay (FDA approved for NASAL specimens in patients 57 years of age and older), is one component of a comprehensive surveillance program. It is not intended to diagnose infection nor to guide or monitor treatment. Performed at [facility], [place] [address] [place], [place] [address]   APTT     Status: None   Collection Time: 06/14/18  9:30 AM  Result Value Ref Range   aPTT 28 24 - 36 seconds    Comment: Performed at [facility], [place] [address], [place], [place] [address]  CBC WITH DIFFERENTIAL     Status: Abnormal   Collection Time: 06/14/18  9:30 AM  Result Value Ref Range   WBC 10.7 (H) 4.0 - 10.5 K/uL   RBC 5.53 (H) 3.87 - 5.11 MIL/uL   Hemoglobin 15.8 (H) 12.0 - 15.0 g/dL   HCT 47.9 (H) 36.0 - 46.0 %   MCV 86.6 80.0 - 100.0 fL   MCH 28.6 26.0 - 34.0 pg   MCHC 33.0 30.0 - 36.0 g/dL   RDW 18.9 (H) 11.5 - 15.5 %   Platelets 218 150 - 400 K/uL   nRBC 0.0 0.0 - 0.2 %   Neutrophils Relative % 70 %   Neutro Abs 7.4 1.7 - 7.7 K/uL   Lymphocytes Relative 17 %   Lymphs Abs 1.8 0.7 - 4.0 K/uL   Monocytes Relative 10 %   Monocytes Absolute 1.1 (H) 0.1 - 1.0 K/uL   Eosinophils Relative 2 %    Eosinophils Absolute 0.2 0.0 - 0.5 K/uL   Basophils Relative 1 %   Basophils Absolute 0.1 0.0 - 0.1 K/uL   Immature Granulocytes 0 %   Abs Immature Granulocytes 0.04 0.00 - 0.07 K/uL    Comment: Performed at [facility], [place] [address] [place], [place] [address]  Comprehensive metabolic panel     Status: Abnormal   Collection Time: 06/14/18  9:30 AM  Result Value Ref Range   Sodium 134 (L) 135 -  145 mmol/L   Potassium 3.5 3.5 - 5.1 mmol/L   Chloride 100 98 - 111 mmol/L   CO2 24 22 - 32 mmol/L   Glucose, Bld 96 70 - 99 mg/dL   BUN 45 (H) 8 - 23 mg/dL   Creatinine, Ser 4.17 (H) 0.44 - 1.00 mg/dL   Calcium 9.4 8.9 - 10.3 mg/dL   Total Protein 7.3 6.5 - 8.1 g/dL   Albumin 3.7 3.5 - 5.0 g/dL   AST 38 15 - 41 U/L   ALT 18 0 - 44 U/L   Alkaline Phosphatase 74 38 - 126 U/L   Total Bilirubin 0.3 0.3 - 1.2 mg/dL   GFR calc non Af Amer 10 (L) >60 mL/min   GFR calc Af Amer 12 (L) >60 mL/min   Anion gap 10 5 - 15    Comment: Performed at [facility], [place] [address], [place], [place] [address]  Protime-INR     Status: None   Collection Time: 06/14/18  9:30 AM  Result Value Ref Range   Prothrombin Time 13.4 11.4 - 15.2 seconds   INR 1.0 0.8 - 1.2    Comment: (NOTE) INR goal varies based on device and disease states. Performed at [facility], [place] [address] [place], [place] [address]   Type and screen Order type and screen if day of surgery is less than 15 days from draw of preadmission visit or order morning of surgery if day of surgery is greater than 6 days from preadmission visit.     Status: None   Collection Time: 06/14/18  9:30 AM  Result Value Ref Range   ABO/RH(D) B NEG    Antibody Screen NEG    Sample Expiration 06/28/2018,2359    Extend sample reason      NO TRANSFUSIONS OR PREGNANCY IN THE PAST 3 MONTHS Performed at [facility], [place] [address] [place], [place] [address]   ABO/Rh     Status:  None   Collection Time: 06/14/18  9:57 AM  Result Value Ref Range   ABO/RH(D)      B NEG Performed at [place] [address], [place], [place] [address]   SARS Coronavirus 2 (CEPHEID - Performed in [place] hospital lab), Hosp Order     Status: None   Collection Time: 06/20/18 11:10 AM  Result Value Ref Range   SARS Coronavirus 2 NEGATIVE NEGATIVE    Comment: (NOTE) If result is NEGATIVE SARS-CoV-2 target nucleic acids are NOT DETECTED. The SARS-CoV-2 RNA is generally detectable in upper and lower  respiratory specimens during the acute phase of infection. The lowest  concentration of SARS-CoV-2 viral copies this assay can detect is 250  copies / mL. A negative result does not preclude SARS-CoV-2 infection  and should not be used as the sole basis for treatment or other  patient management decisions.  A negative result may occur with  improper specimen collection / handling, submission of specimen other  than nasopharyngeal swab, presence of viral mutation(s) within the  areas targeted by this assay, and inadequate number of viral copies  (<250 copies / mL). A negative result must be combined with clinical  observations, patient history, and epidemiological information. If result is POSITIVE SARS-CoV-2 target nucleic acids are DETECTED. The SARS-CoV-2 RNA is generally detectable in upper and lower  respiratory specimens dur ing the acute phase of infection.  Positive  results are indicative of active infection with SARS-CoV-2.  Clinical  correlation with patient history and other diagnostic information is  necessary to determine patient infection status.  Positive results do  not rule out bacterial infection or co-infection with other viruses. If result is PRESUMPTIVE POSTIVE SARS-CoV-2 nucleic acids MAY BE PRESENT.   A presumptive positive result was obtained on the submitted specimen  and confirmed on repeat testing.  While 2019 novel coronavirus   (SARS-CoV-2) nucleic acids may be present in the submitted sample  additional confirmatory testing may be necessary for epidemiological  and / or clinical management purposes  to differentiate between  SARS-CoV-2 and other Sarbecovirus currently known to infect humans.  If clinically indicated additional testing with an alternate test  methodology [phone]) is advised. The SARS-CoV-2 RNA is generally  detectable in upper and lower respiratory sp ecimens during the acute  phase of infection. The expected result is Negative. Fact Sheet for Patients:  StrictlyIdeas.no Fact Sheet for Healthcare Providers: BankingDealers.co.za This test is not yet approved or cleared by the [place] FDA and has been authorized for detection and/or diagnosis of SARS-CoV-2 by FDA under an Emergency Use Authorization (EUA).  This EUA will remain in effect (meaning this test can be used) for the duration of the COVID-19 declaration under Section 564(b)(1) of the Act, 21 U.S.C. section 360bbb-3(b)(1), unless the authorization is terminated or revoked sooner. Performed at [facility], [place] [address], [place], [place] [address]     Estimated body mass index is 22.57 kg/m as calculated from the following:   Height as of 06/14/18: 5\' 1"  (1.549 m).   Weight as of 06/14/18: 54.2 kg.   Imaging Review Plain radiographs demonstrate moderate degenerative joint disease of the left hip(s). The bone quality appears to be fair for age and reported activity level.  MRI: MRI shows labral tear, avascular necrosis, subchondral edema, and intra-articular effusion      Assessment/Plan:  End stage arthritis, left hip(s)  The patient history, physical examination, clinical judgement of the provider and imaging studies are consistent with end stage degenerative joint disease of the left hip(s) and total hip arthroplasty is deemed medically necessary.  The treatment options including medical management, injection therapy, arthroscopy and arthroplasty were discussed at length. The risks and benefits of total hip arthroplasty were presented and reviewed. The risks due to aseptic loosening, infection, stiffness, dislocation/subluxation,  thromboembolic complications and other imponderables were discussed.  The patient acknowledged the explanation, agreed to proceed with the plan and consent was signed. Patient is being admitted for inpatient treatment for surgery, pain control, PT, OT, prophylactic antibiotics, VTE prophylaxis, progressive ambulation and ADL's and discharge planning.The patient is planning to be discharged home with home health services

## 2018-06-22 NOTE — Anesthesia Postprocedure Evaluation (Signed)
Anesthesia Post Note  Patient: Judy Chang  Procedure(s) Performed: TOTAL HIP ARTHROPLASTY ANTERIOR APPROACH (Left )     Patient location during evaluation: PACU Anesthesia Type: General Level of consciousness: awake and alert Pain management: pain level controlled Vital Signs Assessment: post-procedure vital signs reviewed and stable Respiratory status: spontaneous breathing, nonlabored ventilation, respiratory function stable and patient connected to nasal cannula oxygen Cardiovascular status: blood pressure returned to baseline and stable Postop Assessment: no apparent nausea or vomiting Anesthetic complications: no    Last Vitals:  Vitals:   06/22/18 1245 06/22/18 1300  BP: 131/73 (!) 148/74  Pulse: 69 70  Resp: 15 13  Temp:  36.7 C  SpO2: 100% 100%    Last Pain:  Vitals:   06/22/18 1245  TempSrc:   PainSc: 6                  Montez Hageman

## 2018-06-22 NOTE — Op Note (Signed)
NAME: Judy Chang, Judy Chang MEDICAL RECORD [id number] ACCOUNT [id number] DATE OF BIRTH:06/17/1950 FACILITY: [facility] LOCATION: [facility] PHYSICIAN:Lashonne Shull L. Milos Milligan, MD  OPERATIVE REPORT  DATE OF PROCEDURE:  06/22/2018  PREOPERATIVE DIAGNOSIS:  Degenerative arthritis of the left hip.  POSTOPERATIVE DIAGNOSIS:  Degenerative arthritis of the left hip.  PROCEDURE: 1.  Left total hip replacement with a Corail stem size 11, 48 mm Pinnacle porous-coated cup, +4 neutral liner, and a 36 mm 1+1.5 delta ceramic hip ball. 2.  Interpretation of multiple intraoperative fluoroscopic images.  SURGEON:  Dorna Leitz, MD  ASSISTANT:  Gaspar Skeeters PA-C, was present for the entire case and assisted by retraction of the leg, manipulation of tissues, and closing to minimize OR time.  BRIEF HISTORY:  The patient is a 68 year old female with a long history of significant complaints of left hip pain.  She has been treated in the office conservatively for a prolonged period of time.  Imaging was unremarkable.  MRI showed AVN with fluid  in the hip as well as a labral tear.  She had great relief with an intraarticular Marcaine injection, and after failure of these things, she was taken to the operating room for left total hip replacement.  DESCRIPTION OF PROCEDURE:  The patient was brought to the operating room after adequate anesthesia obtained with a general anesthetic.  The patient was placed supine on the operating table and moved onto the Hana bed.  All bony prominences were well  padded.  Attention was then turned to the left hip where an incision was made after routine prep and drape for an anterior approach to the hip.  Subcutaneous tissues of the tensor fascia was divided in line with its fibers.  The muscle was retracted.   The capsule was opened and tagged.  The head was provisionally removed, and the retractors were put in place above and below the acetabulum.  The acetabulum was sequentially reamed to a level  of 47 mm.  A 48 mm Pinnacle cup was hammered into place and 45  degrees of lateral opening, 30 degrees of anteversion.  We checked under fluoroscopy, and once this was done, the neutral liner was placed.  Following this, the attention was turned to the stem side.  It was externally rotated and adducted and extended.   Released the posterior capsule and had nice delivery of the hip into the wound.  At this point, the cookie cutter was used to open the canal followed by the chili pepper followed by sequential rasping up to a level of 11.  Excellent fit with an 11, put  a +0 on.  Did a trial reduction.  It was a touch short at that point.  We then came back, dislocated the hip, took out the rasp.  Put the final 11 stem in.  It sat a little proud, so I trialed it with a zero.  It looked good at that point because of the  proudness of the stem.  At this point, the delta ceramic hip ball zero was opened and placed.  The hip was reduced at this point, and anatomic alignment was achieved.  We did do a stability test with 60 of extension and 90 of external rotation.  At this  point, the wound was irrigated, suctioned dry.  I closed the capsule with 1 Vicryl running, closed the tensor fascia with 0 Vicryl running.  Exparel was instilled throughout the hip tissues for postoperative pain control.  At this point, the skin was  closed with  Monocryl and patient taken to recovery.   She was noted to be in satisfactory condition.  Estimated blood loss for procedure was 300 mL.  The final can be gotten from the anesthetic record.  LN/NUANCE  D:06/22/2018 T:06/22/2018 JOB:[id number]

## 2018-06-22 NOTE — Anesthesia Procedure Notes (Signed)
Procedure Name: Intubation Date/Time: 06/22/2018 10:16 AM Performed by: Sharlette Dense, CRNA Patient Re-evaluated:Patient Re-evaluated prior to induction Oxygen Delivery Method: Circle system utilized Preoxygenation: Pre-oxygenation with 100% oxygen Induction Type: IV induction and Rapid sequence Laryngoscope Size: Miller and 2 Grade View: Grade I Tube type: Oral Tube size: 7.0 mm Number of attempts: 1 Airway Equipment and Method: Stylet Placement Confirmation: ETT inserted through vocal cords under direct vision,  positive ETCO2 and breath sounds checked- equal and bilateral Secured at: 20 cm Tube secured with: Tape Dental Injury: Teeth and Oropharynx as per pre-operative assessment

## 2018-06-22 NOTE — Anesthesia Preprocedure Evaluation (Addendum)
Anesthesia Evaluation  Patient identified by MRN, date of birth, ID band Patient awake    Reviewed: Allergy & Precautions, NPO status , Patient's Chart, lab work & pertinent test results  Airway Mallampati: II  TM Distance: >3 FB Neck ROM: Full    Dental no notable dental hx. (+) Upper Dentures, Lower Dentures   Pulmonary Current Smoker,    Pulmonary exam normal breath sounds clear to auscultation       Cardiovascular hypertension, Pt. on medications Normal cardiovascular exam Rhythm:Regular Rate:Normal     Neuro/Psych negative neurological ROS  negative psych ROS   GI/Hepatic negative GI ROS, Neg liver ROS,   Endo/Other  negative endocrine ROS  Renal/GU Renal InsufficiencyRenal disease  negative genitourinary   Musculoskeletal negative musculoskeletal ROS (+)   Abdominal   Peds negative pediatric ROS (+)  Hematology negative hematology ROS (+)   Anesthesia Other Findings Spinal stenosis  Reproductive/Obstetrics negative OB ROS                           Anesthesia Physical Anesthesia Plan  ASA: II  Anesthesia Plan: General   Post-op Pain Management:    Induction: Intravenous  PONV Risk Score and Plan: 2 and Ondansetron, Dexamethasone and Treatment may vary due to age or medical condition  Airway Management Planned: Oral ETT  Additional Equipment:   Intra-op Plan:   Post-operative Plan: Extubation in OR  Informed Consent: I have reviewed the patients History and Physical, chart, labs and discussed the procedure including the risks, benefits and alternatives for the proposed anesthesia with the patient or authorized representative who has indicated his/her understanding and acceptance.     Dental advisory given  Plan Discussed with: CRNA  Anesthesia Plan Comments: (See PAT note 06/14/2018, Konrad Felix, PA-C.  Creatinine rechecked 06/19/2018 (on chart) with improvement,  1.47)      Anesthesia Quick Evaluation

## 2018-06-22 NOTE — Brief Op Note (Signed)
06/22/2018  11:44 AM  PATIENT:  Judy Chang  68 y.o. female  PRE-OPERATIVE DIAGNOSIS:  AVASCULAR NECROSIS LEFT HIP  POST-OPERATIVE DIAGNOSIS:  AVASCULAR NECROSIS LEFT HIP  PROCEDURE:  Procedure(s): TOTAL HIP ARTHROPLASTY ANTERIOR APPROACH (Left)  SURGEON:  Surgeon(s) and Role:    Dorna Leitz, MD - Primary  PHYSICIAN ASSISTANT:   ASSISTANTS: jim bethune   ANESTHESIA:   general  EBL:  300 mL   BLOOD ADMINISTERED:none  DRAINS: none   LOCAL MEDICATIONS USED:  MARCAINE    and OTHER experel  SPECIMEN:  No Specimen  DISPOSITION OF SPECIMEN:  N/A  COUNTS:  YES  TOURNIQUET:  * No tourniquets in log *  DICTATION: .Other Dictation: Dictation Number [id number]  PLAN OF CARE: Admit for overnight observation  PATIENT DISPOSITION:  PACU - hemodynamically stable.   Delay start of Pharmacological VTE agent (>24hrs) due to surgical blood loss or risk of bleeding: no

## 2018-06-22 NOTE — Transfer of Care (Signed)
Immediate Anesthesia Transfer of Care Note  Patient: Judy Chang  Procedure(s) Performed: TOTAL HIP ARTHROPLASTY ANTERIOR APPROACH (Left )  Patient Location: PACU  Anesthesia Type:General  Level of Consciousness: awake and alert   Airway & Oxygen Therapy: Patient Spontanous Breathing and Patient connected to face mask oxygen  Post-op Assessment: Report given to RN and Post -op Vital signs reviewed and stable  Post vital signs: Reviewed and stable  Last Vitals:  Vitals Value Taken Time  BP    Temp    Pulse 88 06/22/2018 12:12 PM  Resp 20 06/22/2018 12:12 PM  SpO2 100 % 06/22/2018 12:12 PM  Vitals shown include unvalidated device data.  Last Pain:  Vitals:   06/22/18 0902  TempSrc:   PainSc: 6       Patients Stated Pain Goal: 4 (42/76/70 1100)  Complications: No apparent anesthesia complications

## 2018-06-22 NOTE — Evaluation (Signed)
Physical Therapy Evaluation Patient Details Name: Judy Chang MRN: [id number] DOB: 27-Apr-1950 Today's Date: 06/22/2018   History of Present Illness  Pt s/p L THR and with hx of DDD and spinal stenosis  Clinical Impression  Pt s/p L THR and presents with decreased L LE strength/ROM and post op pain limiting functional mobility.  Pt should progress to dc home with family assist and HHPT follow-up.    Follow Up Recommendations Home health PT    Equipment Recommendations  Rolling walker with 5" wheels(Pt is 5'1)    Recommendations for Other Services OT consult     Precautions / Restrictions Precautions Precautions: Fall Restrictions Weight Bearing Restrictions: No Other Position/Activity Restrictions: WBAT      Mobility  Bed Mobility Overal bed mobility: Needs Assistance Bed Mobility: Supine to Sit;Sit to Supine     Supine to sit: Mod assist Sit to supine: Mod assist;+2 for physical assistance   General bed mobility comments: increased time with cues for sequence and use of R LE to self assist  Transfers Overall transfer level: Needs assistance Equipment used: Rolling walker (2 wheeled) Transfers: Sit to/from Stand Sit to Stand: Mod assist;+2 safety/equipment         General transfer comment: cues for LE management and use of UEs to self assist  Ambulation/Gait Ambulation/Gait assistance: Min assist;+2 physical assistance;+2 safety/equipment Gait Distance (Feet): 8 Feet Assistive device: Rolling walker (2 wheeled) Gait Pattern/deviations: Step-to pattern;Decreased step length - right;Decreased step length - left;Shuffle;Trunk flexed Gait velocity: decr   General Gait Details: cues for posture, sequence and position from [employer] Mobility    Modified Rankin (Stroke Patients Only)       Balance Overall balance assessment: Needs assistance Sitting-balance support: Bilateral upper extremity supported;Feet  supported Sitting balance-Leahy Scale: Fair     Standing balance support: Bilateral upper extremity supported Standing balance-Leahy Scale: Poor                               Pertinent Vitals/Pain Pain Assessment: 0-10 Pain Score: 7  Pain Location: L hip  Pain Descriptors / Indicators: Aching;Sore Pain Intervention(s): Limited activity within patient's tolerance;Monitored during session;Premedicated before session;Ice applied    Home Living Family/patient expects to be discharged to:: Private residence Living Arrangements: Spouse/significant other Available Help at Discharge: Family Type of Home: Apartment Home Access: Stairs to enter Entrance Stairs-Rails: Right Entrance Stairs-Number of Steps: [place]: One level Home Equipment: Cane - single point      Prior Function Level of Independence: Independent with assistive device(s)         Comments: pt using cane prior to admit     Hand Dominance        Extremity/Trunk Assessment   Upper Extremity Assessment Upper Extremity Assessment: Overall WFL for tasks assessed    Lower Extremity Assessment Lower Extremity Assessment: LLE deficits/detail    Cervical / Trunk Assessment Cervical / Trunk Assessment: Normal  Communication   Communication: No difficulties  Cognition Arousal/Alertness: Awake/alert Behavior During Therapy: WFL for tasks assessed/performed Overall Cognitive Status: Within Functional Limits for tasks assessed                                        General Comments      Exercises Total Joint  Exercises Ankle Circles/Pumps: AROM;15 reps;Supine;Both   Assessment/Plan    PT Assessment Patient needs continued PT services  PT Problem List Decreased strength;Decreased range of motion;Decreased activity tolerance;Decreased balance;Decreased mobility;Decreased knowledge of use of DME;Pain;Decreased knowledge of precautions       PT Treatment Interventions DME  instruction;Gait training;Stair training;Functional mobility training;Therapeutic activities;Therapeutic exercise;Patient/family education    PT Goals (Current goals can be found in the Care Plan section)  Acute Rehab PT Goals Patient Stated Goal: Regain IND PT Goal Formulation: With patient Time For Goal Achievement: 06/29/18 Potential to Achieve Goals: Good    Frequency 7X/week   Barriers to discharge        Co-evaluation               AM-PAC PT "6 Clicks" Mobility  Outcome Measure Help needed turning from your back to your side while in a flat bed without using bedrails?: A Lot Help needed moving from lying on your back to sitting on the side of a flat bed without using bedrails?: A Lot Help needed moving to and from a bed to a chair (including a wheelchair)?: A Lot Help needed standing up from a chair using your arms (e.g., wheelchair or bedside chair)?: A Lot Help needed to walk in hospital room?: A Lot Help needed climbing 3-5 steps with a railing? : Total 6 Click Score: 11    End of Session Equipment Utilized During Treatment: Gait belt Activity Tolerance: Patient limited by pain Patient left: in bed;with call bell/phone within reach;with nursing/sitter in room Nurse Communication: Mobility status PT Visit Diagnosis: Difficulty in walking, not elsewhere classified (R26.2);Pain Pain - Right/Left: Left Pain - part of body: Hip    Time: 6659-9357 PT Time Calculation (min) (ACUTE ONLY): 32 min   Charges:   PT Evaluation $PT Eval Low Complexity: 1 Low PT Treatments $Gait Training: 8-22 mins        Debe Coder PT Acute Rehabilitation Services Pager [phone] Office [phone]   [facility] 06/22/2018, 6:41 PM

## 2018-06-23 DIAGNOSIS — M1612 Unilateral primary osteoarthritis, left hip: Secondary | ICD-10-CM | POA: Diagnosis not present

## 2018-06-23 LAB — CBC
HCT: 43.4 % (ref 36.0–46.0)
Hemoglobin: 14.7 g/dL (ref 12.0–15.0)
MCH: 28.7 pg (ref 26.0–34.0)
MCHC: 33.9 g/dL (ref 30.0–36.0)
MCV: 84.6 fL (ref 80.0–100.0)
Platelets: 225 10*3/uL (ref 150–400)
RBC: 5.13 MIL/uL — ABNORMAL HIGH (ref 3.87–5.11)
RDW: 16.8 % — ABNORMAL HIGH (ref 11.5–15.5)
WBC: 17.4 10*3/uL — ABNORMAL HIGH (ref 4.0–10.5)
nRBC: 0 % (ref 0.0–0.2)

## 2018-06-23 NOTE — Progress Notes (Signed)
Physical Therapy Treatment Patient Details Name: Judy Chang MRN: [id number] DOB: 1950-08-22 Today's Date: 06/23/2018    History of Present Illness Pt s/p L THR and with hx of DDD and spinal stenosis    PT Comments    Pt with marked improvement in activity tolerance and ability to participate with PT this am.  Pt hopeful for dc home this date.   Follow Up Recommendations  Home health PT     Equipment Recommendations  Rolling walker with 5" wheels;3in1 (PT)    Recommendations for Other Services OT consult     Precautions / Restrictions Precautions Precautions: Fall Restrictions Weight Bearing Restrictions: No Other Position/Activity Restrictions: WBAT    Mobility  Bed Mobility Overal bed mobility: Needs Assistance Bed Mobility: Supine to Sit;Sit to Supine     Supine to sit: Min assist Sit to supine: Min assist   General bed mobility comments: increased time with cues for sequence and use of R LE to self assist  Transfers Overall transfer level: Needs assistance Equipment used: Rolling walker (2 wheeled) Transfers: Sit to/from Stand Sit to Stand: Min assist;Min guard         General transfer comment: cues for LE management and use of UEs to self assist  Ambulation/Gait Ambulation/Gait assistance: Min assist;Min guard Gait Distance (Feet): 80 Feet Assistive device: Rolling walker (2 wheeled) Gait Pattern/deviations: Step-to pattern;Decreased step length - right;Decreased step length - left;Shuffle;Trunk flexed Gait velocity: decr   General Gait Details: cues for posture, sequence and position from RW   Stairs Stairs: Yes Stairs assistance: Min assist Stair Management: Two rails;Step to pattern;Forwards Number of Stairs: 5 General stair comments: cues for sequence and foot placement   Wheelchair Mobility    Modified Rankin (Stroke Patients Only)       Balance Overall balance assessment: Needs assistance Sitting-balance support: Feet  supported;No upper extremity supported Sitting balance-Leahy Scale: Good     Standing balance support: Bilateral upper extremity supported Standing balance-Leahy Scale: Fair                              Cognition Arousal/Alertness: Awake/alert Behavior During Therapy: WFL for tasks assessed/performed Overall Cognitive Status: Within Functional Limits for tasks assessed                                        Exercises Total Joint Exercises Ankle Circles/Pumps: AROM;15 reps;Supine;Both Quad Sets: AROM;Both;10 reps;Supine Heel Slides: AAROM;Left;20 reps;Supine Hip ABduction/ADduction: AAROM;Left;15 reps;Supine    General Comments        Pertinent Vitals/Pain Pain Assessment: 0-10 Pain Score: 4  Pain Location: L hip  Pain Descriptors / Indicators: Aching;Sore Pain Intervention(s): Limited activity within patient's tolerance;Monitored during session;Premedicated before session;Ice applied    Home Living                      Prior Function            PT Goals (current goals can now be found in the care plan section) Acute Rehab PT Goals Patient Stated Goal: Regain IND PT Goal Formulation: With patient Time For Goal Achievement: 06/29/18 Potential to Achieve Goals: Good Progress towards PT goals: Progressing toward goals    Frequency    7X/week      PT Plan Current plan remains appropriate    Co-evaluation  AM-PAC PT "6 Clicks" Mobility   Outcome Measure  Help needed turning from your back to your side while in a flat bed without using bedrails?: A Little Help needed moving from lying on your back to sitting on the side of a flat bed without using bedrails?: A Little Help needed moving to and from a bed to a chair (including a wheelchair)?: A Little Help needed standing up from a chair using your arms (e.g., wheelchair or bedside chair)?: A Little Help needed to walk in hospital room?: A Little Help  needed climbing 3-5 steps with a railing? : A Little 6 Click Score: 18    End of Session Equipment Utilized During Treatment: Gait belt Activity Tolerance: Patient tolerated treatment well Patient left: in bed;with call bell/phone within reach;with nursing/sitter in room Nurse Communication: Mobility status PT Visit Diagnosis: Difficulty in walking, not elsewhere classified (R26.2);Pain Pain - Right/Left: Left Pain - part of body: Hip     Time: 1132-1210 PT Time Calculation (min) (ACUTE ONLY): 38 min  Charges:  $Gait Training: 8-22 mins $Therapeutic Exercise: 8-22 mins $Therapeutic Activity: 8-22 mins                     Debe Coder PT Acute Rehabilitation Services Pager [phone] Office [phone]    [facility] 06/23/2018, 12:39 PM

## 2018-06-23 NOTE — Plan of Care (Signed)
  Problem: Education: Goal: Knowledge of General Education information will improve Description Including pain rating scale, medication(s)/side effects and non-pharmacologic comfort measures Outcome: Adequate for Discharge   Problem: Health Behavior/Discharge Planning: Goal: Ability to manage health-related needs will improve Outcome: Adequate for Discharge   Problem: Clinical Measurements: Goal: Ability to maintain clinical measurements within normal limits will improve Outcome: Adequate for Discharge Goal: Will remain free from infection Outcome: Adequate for Discharge Goal: Diagnostic test results will improve Outcome: Adequate for Discharge Goal: Respiratory complications will improve Outcome: Adequate for Discharge Goal: Cardiovascular complication will be avoided Outcome: Adequate for Discharge   Problem: Activity: Goal: Risk for activity intolerance will decrease Outcome: Adequate for Discharge   Problem: Nutrition: Goal: Adequate nutrition will be maintained Outcome: Adequate for Discharge   Problem: Coping: Goal: Level of anxiety will decrease Outcome: Adequate for Discharge   Problem: Elimination: Goal: Will not experience complications related to bowel motility Outcome: Adequate for Discharge Goal: Will not experience complications related to urinary retention Outcome: Adequate for Discharge  Home with husband today. Discharge teaching done.  Written information given.

## 2018-06-23 NOTE — TOC Progression Note (Signed)
Transition of Care [facility]) - Progression Note    Patient Details  Name: Judy Chang MRN: [id number] Date of Birth: 04-03-1950  [facility] CM/SW Contact  Apolonio Schneiders, RN Phone Number: 06/23/2018, 12:27 PM  Clinical Narrative:  CM spoke with the patient at the bedside. Patient needs a rolling walker. Reports she is able to get to her appointments, afford her medications and does have assistance at home. DME requested from [place].     Expected Discharge Plan: Home/Self Care Barriers to Discharge: No Barriers Identified  Expected Discharge Plan and Services Expected Discharge Plan: Home/Self Care   Discharge Planning Services: CM Consult     Expected Discharge Date: 06/23/18               DME Arranged: Gilford Rile rolling DME Agency: [employer] Date DME Agency Contacted: 06/23/18 Time DME Agency Contacted: 30 Representative spoke with at DME Agency: [place]: (arranged by surgeon's office prior to admission) [place]: [facility] (now [facility])     Representative spoke with at [place]: Patient is listed on the [place] list 06/23/18   Social Determinants of Health (SDOH) Interventions    Readmission Risk Interventions No flowsheet data found.

## 2018-06-23 NOTE — Discharge Summary (Signed)
Patient ID: Judy Chang MRN: [id number] DOB/AGE: August 29, 1950 68 y.o.  Admit date: 06/22/2018 Discharge date: 06/23/2018  Admission Diagnoses:  Principal Problem:   Primary osteoarthritis of left hip   Discharge Diagnoses:  Same  Past Medical History:  Diagnosis Date  . 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 Anxiety   . Arthritis   . 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 Benign essential HTN 10/23/2015  . 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 DDD (degenerative disc disease), lumbar 01/10/2011   Noted on MRI  . Depression   . DJD (degenerative joint disease)   . GERD (gastroesophageal reflux disease)   . History of cataract    Bilateral  . Hypercholesterolemia   . Hypertension   . Labial tear 02/25/2018   Noted on MRI: smal Left anterior   . Lumbar spondylosis 01/10/2011   Noted on MRI  . Migraines   . Renal cyst 11/17/2015   Notedon [place] Abd: Simple, Right  . Spinal stenosis 09/06/2016   Noted on MRI: Moderate-Severe  . Vitreous floater, bilateral     Surgeries: Procedure(s): TOTAL HIP ARTHROPLASTY ANTERIOR APPROACH on 06/22/2018   Consultants:   Discharged Condition: Improved  Hospital Course: Judy Chang is an 68 y.o. female who was admitted 06/22/2018 for operative treatment ofPrimary osteoarthritis of left hip. Patient has severe unremitting pain that affects sleep, daily activities, and work/hobbies. After pre-op clearance the patient was taken to the operating room on 06/22/2018 and underwent   On postoperative day 1 the patient was ambulating with therapy.  She was sitting up in the chair.  Her pain was controlled.  She was ready to go home.  She denied any numbness or tingling in the left lower extremity.  Exam: She she is awake and alert No acute distress Respirations even and unlabored Left lower extremity in a normal resting position.  Leg lengths grossly appear symmetric.  Dressing intact to  the left hip without shadowing. Sensation intact on the dorsal and plantar foot.  Patient is able to dorsiflex and plantarflex the ankle.    Procedure(s): TOTAL HIP ARTHROPLASTY ANTERIOR APPROACH.    Patient was given perioperative antibiotics:  Anti-infectives (From admission, onward)   Start     Dose/Rate Route Frequency Ordered Stop   06/22/18 1600  ceFAZolin (ANCEF) IVPB 2g/100 mL premix     2 g 200 mL/hr over 30 Minutes Intravenous Every 6 hours 06/22/18 1412 06/22/18 2142   06/22/18 0830  ceFAZolin (ANCEF) IVPB 2g/100 mL premix     2 g 200 mL/hr over 30 Minutes Intravenous On call to O.R. 06/22/18 2951 06/22/18 1030       Patient was given sequential compression devices, early ambulation, and chemoprophylaxis to prevent DVT.  Patient benefited maximally from hospital stay and there were no complications.    Recent vital signs:  Patient Vitals for the past 24 hrs:  BP Temp Temp src Pulse Resp SpO2  06/23/18 0553 (!) 168/80 98.7 F (37.1 C) Oral (!) 58 16 -  06/23/18 0145 (!) 160/85 98.7 F (37.1 C) Oral 63 20 97 %  06/22/18 2157 (!) 151/80 98.6 F (37 C) Oral 60 16 98 %  06/22/18 1707 140/68 97.7 F (36.5 C) - 60 18 100 %  06/22/18 1609 136/75 97.6 F (36.4 C) - 65 18 100 %  06/22/18 1510 131/64 97.6 F (36.4 C) - 62 18 100 %  06/22/18 1414  129/68 (!) 97.5 F (36.4 C) Oral - 15 99 %  06/22/18 1345 124/82 (!) 97.5 F (36.4 C) - 69 14 100 %  06/22/18 1315 (!) 148/70 - - 71 13 100 %  06/22/18 1300 (!) 148/74 98 F (36.7 C) - 70 13 100 %  06/22/18 1245 131/73 - - 69 15 100 %  06/22/18 1230 - - - 80 17 100 %  06/22/18 1215 (!) 136/123 - - 91 16 100 %  06/22/18 1213 (!) 136/123 98 F (36.7 C) - 89 15 100 %  06/22/18 1210 - - - 92 - 100 %     Recent laboratory studies:  Recent Labs    06/22/18 0853 06/23/18 0414  WBC  --  17.4*  HGB  --  14.7  HCT  --  43.4  PLT  --  225  NA 129*  --   K 3.3*  --   CL 95*  --   CO2 25  --   BUN 27*  --   CREATININE  2.26*  --   GLUCOSE 94  --   CALCIUM 9.5  --      Discharge Medications:   Allergies as of 06/23/2018      Reactions   Nsaids Rash      Medication List    STOP taking these medications   SALONPAS PAIN RELIEF PATCH EX   traMADol 50 MG tablet Commonly known as:  ULTRAM     TAKE these medications   amLODipine 5 MG tablet Commonly known as:  NORVASC Take 5 mg by mouth daily.   aspirin EC 325 MG tablet Take 1 tablet (325 mg total) by mouth 2 (two) times daily after a meal. Take x 1 month post op to decrease risk of blood clots.   docusate sodium 100 MG capsule Commonly known as:  Colace Take 1 capsule (100 mg total) by mouth 2 (two) times daily.   GARLIC PO Take 1 tablet by mouth 2 (two) times daily.   lisinopril-hydrochlorothiazide 20-25 MG tablet Commonly known as:  ZESTORETIC Take 1 tablet by mouth daily.   loratadine 10 MG tablet Commonly known as:  CLARITIN Take 10 mg by mouth daily as needed for allergies.   Oxycodone HCl 20 MG Tabs Take 1 tablet by mouth 4 (four) times daily.   oxyCODONE-acetaminophen 5-325 MG tablet Commonly known as:  PERCOCET/ROXICET Take 1-2 tablets by mouth every 6 (six) hours as needed (for breakthrough pain.).   pregabalin 75 MG capsule Commonly known as:  LYRICA Take 75 mg by mouth 2 (two) times daily.   tiZANidine 2 MG tablet Commonly known as:  ZANAFLEX Take 1 tablet (2 mg total) by mouth every 8 (eight) hours as needed for muscle spasms.   traZODone 50 MG tablet Commonly known as:  DESYREL Take 50 mg by mouth at bedtime.       Diagnostic Studies: Dg Chest 2 View  Result Date: 06/14/2018 CLINICAL DATA:  Preop hip surgery EXAM: CHEST - 2 VIEW COMPARISON:  10/22/2015 FINDINGS: Heart and mediastinal contours are within normal limits. No focal opacities or effusions. No acute bony abnormality. IMPRESSION: No active cardiopulmonary disease. Electronically Signed   By: Rolm Baptise M.D.   On: 06/14/2018 12:01   Dg C-arm 1-60  Min-no Report  Result Date: 06/22/2018 CLINICAL DATA:  Right hip replacement EXAM: OPERATIVE right HIP (WITH PELVIS IF PERFORMED) 6 VIEWS TECHNIQUE: Fluoroscopic spot image(s) were submitted for interpretation post-operatively. COMPARISON:  None. FLUOROSCOPY TIME:  24.7  seconds FINDINGS: Multiple intraoperative fluoroscopic spot images are provided. Interval left total hip arthroplasty. Normal alignment. IMPRESSION: Intraoperative localization. Electronically Signed   By: Kathreen Devoid   On: 06/22/2018 11:42   Dg Hip Operative Unilat With Pelvis Right  Result Date: 06/22/2018 CLINICAL DATA:  Right hip replacement EXAM: OPERATIVE right HIP (WITH PELVIS IF PERFORMED) 6 VIEWS TECHNIQUE: Fluoroscopic spot image(s) were submitted for interpretation post-operatively. COMPARISON:  None. FLUOROSCOPY TIME:  24.7 seconds FINDINGS: Multiple intraoperative fluoroscopic spot images are provided. Interval left total hip arthroplasty. Normal alignment. IMPRESSION: Intraoperative localization. Electronically Signed   By: Kathreen Devoid   On: 06/22/2018 11:42    Disposition: Discharge disposition: 01-Home or Self Care       Discharge Instructions    Call MD / Call 911   Complete by:  As directed    If you experience chest pain or shortness of breath, CALL 911 and be transported to the hospital emergency room.  If you develope a fever above 101 F, pus (white drainage) or increased drainage or redness at the wound, or calf pain, call your surgeon's office.   Constipation Prevention   Complete by:  As directed    Drink plenty of fluids.  Prune juice may be helpful.  You may use a stool softener, such as Colace (over the counter) 100 mg twice a day.  Use MiraLax (over the counter) for constipation as needed.   Diet - low sodium heart healthy   Complete by:  As directed    Increase activity slowly as tolerated   Complete by:  As directed       Follow-up Information    Dorna Leitz, MD. Schedule an appointment  as soon as possible for a visit in 2 weeks.   Specialty:  Orthopedic Surgery Contact information: [place] [address] [phone]            Signed: Erle Crocker 06/23/2018, 8:38 AM

## 2018-06-24 ENCOUNTER — Encounter (HOSPITAL_COMMUNITY): Payer: Self-pay | Admitting: Orthopedic Surgery

## 2019-02-07 ENCOUNTER — Inpatient Hospital Stay (HOSPITAL_COMMUNITY)
Admission: EM | Admit: 2019-02-07 | Discharge: 2019-02-17 | DRG: 070 | Disposition: A | Discharge status: Home Health | Payer: Medicare Other | Attending: Family Medicine | Admitting: Family Medicine

## 2019-02-07 ENCOUNTER — Emergency Department (HOSPITAL_COMMUNITY): Payer: Medicare Other

## 2019-02-07 ENCOUNTER — Other Ambulatory Visit: Payer: Self-pay

## 2019-02-07 ENCOUNTER — Encounter (HOSPITAL_COMMUNITY): Payer: Self-pay | Admitting: Neurology

## 2019-02-07 DIAGNOSIS — G40901 Epilepsy, unspecified, not intractable, with status epilepticus: Secondary | ICD-10-CM | POA: Diagnosis present

## 2019-02-07 DIAGNOSIS — N183 Chronic kidney disease, stage 3 unspecified: Secondary | ICD-10-CM | POA: Diagnosis present

## 2019-02-07 DIAGNOSIS — I6783 Posterior reversible encephalopathy syndrome: Secondary | ICD-10-CM | POA: Diagnosis present

## 2019-02-07 DIAGNOSIS — M19031 Primary osteoarthritis, right wrist: Secondary | ICD-10-CM | POA: Diagnosis present

## 2019-02-07 DIAGNOSIS — G9341 Metabolic encephalopathy: Secondary | ICD-10-CM | POA: Diagnosis present

## 2019-02-07 DIAGNOSIS — I129 Hypertensive chronic kidney disease with stage 1 through stage 4 chronic kidney disease, or unspecified chronic kidney disease: Secondary | ICD-10-CM | POA: Diagnosis present

## 2019-02-07 DIAGNOSIS — F419 Anxiety disorder, unspecified: Secondary | ICD-10-CM | POA: Diagnosis present

## 2019-02-07 DIAGNOSIS — Z7982 Long term (current) use of aspirin: Secondary | ICD-10-CM

## 2019-02-07 DIAGNOSIS — I10 Essential (primary) hypertension: Secondary | ICD-10-CM | POA: Diagnosis not present

## 2019-02-07 DIAGNOSIS — M25539 Pain in unspecified wrist: Secondary | ICD-10-CM

## 2019-02-07 DIAGNOSIS — G40101 Localization-related (focal) (partial) symptomatic epilepsy and epileptic syndromes with simple partial seizures, not intractable, with status epilepticus: Secondary | ICD-10-CM | POA: Diagnosis present

## 2019-02-07 DIAGNOSIS — I161 Hypertensive emergency: Secondary | ICD-10-CM | POA: Diagnosis present

## 2019-02-07 DIAGNOSIS — E1165 Type 2 diabetes mellitus with hyperglycemia: Secondary | ICD-10-CM | POA: Diagnosis present

## 2019-02-07 DIAGNOSIS — F17290 Nicotine dependence, other tobacco product, uncomplicated: Secondary | ICD-10-CM | POA: Diagnosis present

## 2019-02-07 DIAGNOSIS — E876 Hypokalemia: Secondary | ICD-10-CM | POA: Diagnosis present

## 2019-02-07 DIAGNOSIS — E785 Hyperlipidemia, unspecified: Secondary | ICD-10-CM | POA: Diagnosis present

## 2019-02-07 DIAGNOSIS — M25532 Pain in left wrist: Secondary | ICD-10-CM | POA: Diagnosis not present

## 2019-02-07 DIAGNOSIS — I674 Hypertensive encephalopathy: Secondary | ICD-10-CM | POA: Diagnosis present

## 2019-02-07 DIAGNOSIS — E1122 Type 2 diabetes mellitus with diabetic chronic kidney disease: Secondary | ICD-10-CM | POA: Diagnosis present

## 2019-02-07 DIAGNOSIS — G8929 Other chronic pain: Secondary | ICD-10-CM | POA: Diagnosis present

## 2019-02-07 DIAGNOSIS — F13239 Sedative, hypnotic or anxiolytic dependence with withdrawal, unspecified: Secondary | ICD-10-CM | POA: Diagnosis not present

## 2019-02-07 DIAGNOSIS — K219 Gastro-esophageal reflux disease without esophagitis: Secondary | ICD-10-CM | POA: Diagnosis present

## 2019-02-07 DIAGNOSIS — E872 Acidosis: Secondary | ICD-10-CM | POA: Diagnosis present

## 2019-02-07 DIAGNOSIS — Z8249 Family history of ischemic heart disease and other diseases of the circulatory system: Secondary | ICD-10-CM

## 2019-02-07 DIAGNOSIS — E11649 Type 2 diabetes mellitus with hypoglycemia without coma: Secondary | ICD-10-CM | POA: Diagnosis not present

## 2019-02-07 DIAGNOSIS — M5136 Other intervertebral disc degeneration, lumbar region: Secondary | ICD-10-CM | POA: Diagnosis present

## 2019-02-07 DIAGNOSIS — I2510 Atherosclerotic heart disease of native coronary artery without angina pectoris: Secondary | ICD-10-CM | POA: Diagnosis present

## 2019-02-07 DIAGNOSIS — Z20822 Contact with and (suspected) exposure to covid-19: Secondary | ICD-10-CM | POA: Diagnosis present

## 2019-02-07 DIAGNOSIS — M25531 Pain in right wrist: Secondary | ICD-10-CM | POA: Diagnosis not present

## 2019-02-07 DIAGNOSIS — J9690 Respiratory failure, unspecified, unspecified whether with hypoxia or hypercapnia: Secondary | ICD-10-CM | POA: Diagnosis present

## 2019-02-07 DIAGNOSIS — M4800 Spinal stenosis, site unspecified: Secondary | ICD-10-CM | POA: Diagnosis present

## 2019-02-07 DIAGNOSIS — D751 Secondary polycythemia: Secondary | ICD-10-CM | POA: Diagnosis present

## 2019-02-07 DIAGNOSIS — Z79899 Other long term (current) drug therapy: Secondary | ICD-10-CM

## 2019-02-07 DIAGNOSIS — Z888 Allergy status to other drugs, medicaments and biological substances status: Secondary | ICD-10-CM

## 2019-02-07 DIAGNOSIS — F329 Major depressive disorder, single episode, unspecified: Secondary | ICD-10-CM | POA: Diagnosis present

## 2019-02-07 DIAGNOSIS — R569 Unspecified convulsions: Secondary | ICD-10-CM

## 2019-02-07 LAB — COMPREHENSIVE METABOLIC PANEL
ALT: 16 U/L (ref 0–44)
AST: 25 U/L (ref 15–41)
Albumin: 3.5 g/dL (ref 3.5–5.0)
Alkaline Phosphatase: 109 U/L (ref 38–126)
Anion gap: 16 — ABNORMAL HIGH (ref 5–15)
BUN: 12 mg/dL (ref 8–23)
CO2: 19 mmol/L — ABNORMAL LOW (ref 22–32)
Calcium: 10.4 mg/dL — ABNORMAL HIGH (ref 8.9–10.3)
Chloride: 102 mmol/L (ref 98–111)
Creatinine, Ser: 1.34 mg/dL — ABNORMAL HIGH (ref 0.44–1.00)
GFR calc Af Amer: 46 mL/min — ABNORMAL LOW (ref >60)
GFR calc non Af Amer: 40 mL/min — ABNORMAL LOW (ref >60)
Glucose, Bld: 199 mg/dL — ABNORMAL HIGH (ref 70–99)
Potassium: 3.4 mmol/L — ABNORMAL LOW (ref 3.5–5.1)
Sodium: 137 mmol/L (ref 135–145)
Total Bilirubin: 1 mg/dL (ref 0.3–1.2)
Total Protein: 8.2 g/dL — ABNORMAL HIGH (ref 6.5–8.1)

## 2019-02-07 LAB — POCT I-STAT 7, (LYTES, BLD GAS, ICA,H+H)
Acid-base deficit: 2 mmol/L (ref 0.0–2.0)
Bicarbonate: 23.2 mmol/L (ref 20.0–28.0)
Calcium, Ion: 1.28 mmol/L (ref 1.15–1.40)
HCT: 63 % — ABNORMAL HIGH (ref 36.0–46.0)
Hemoglobin: 21.4 g/dL (ref 12.0–15.0)
O2 Saturation: 98 %
Potassium: 3.3 mmol/L — ABNORMAL LOW (ref 3.5–5.1)
Sodium: 137 mmol/L (ref 135–145)
TCO2: 24 mmol/L (ref 22–32)
pCO2 arterial: 39.4 mmHg (ref 32.0–48.0)
pH, Arterial: 7.378 (ref 7.350–7.450)
pO2, Arterial: 104 mmHg (ref 83.0–108.0)

## 2019-02-07 LAB — PHENYTOIN LEVEL, TOTAL: Phenytoin Lvl: <2.5 ug/mL — ABNORMAL LOW (ref 10.0–20.0)

## 2019-02-07 LAB — CBG MONITORING, ED
Glucose-Capillary: 172 mg/dL — ABNORMAL HIGH (ref 70–99)
Glucose-Capillary: 141 mg/dL — ABNORMAL HIGH (ref 70–99)

## 2019-02-07 LAB — MAGNESIUM: Magnesium: 1.5 mg/dL — ABNORMAL LOW (ref 1.7–2.4)

## 2019-02-07 LAB — MRSA PCR SCREENING: MRSA by PCR: NEGATIVE

## 2019-02-07 LAB — DIFFERENTIAL
Abs Immature Granulocytes: 0.03 10*3/uL (ref 0.00–0.07)
Basophils Absolute: 0.1 10*3/uL (ref 0.0–0.1)
Basophils Relative: 1 %
Eosinophils Absolute: 0 10*3/uL (ref 0.0–0.5)
Eosinophils Relative: 0 %
Immature Granulocytes: 0 %
Lymphocytes Relative: 24 %
Lymphs Abs: 2.5 10*3/uL (ref 0.7–4.0)
Monocytes Absolute: 0.6 10*3/uL (ref 0.1–1.0)
Monocytes Relative: 6 %
Neutro Abs: 7.3 10*3/uL (ref 1.7–7.7)
Neutrophils Relative %: 69 %

## 2019-02-07 LAB — GLUCOSE, CAPILLARY
Glucose-Capillary: 117 mg/dL — ABNORMAL HIGH (ref 70–99)
Glucose-Capillary: 154 mg/dL — ABNORMAL HIGH (ref 70–99)

## 2019-02-07 LAB — PHOSPHORUS: Phosphorus: 3.3 mg/dL (ref 2.5–4.6)

## 2019-02-07 LAB — RESPIRATORY PANEL BY RT PCR (FLU A&B, COVID)
Influenza A by PCR: NEGATIVE
Influenza B by PCR: NEGATIVE
SARS Coronavirus 2 by RT PCR: NEGATIVE

## 2019-02-07 LAB — CBC
HCT: 64.4 % — ABNORMAL HIGH (ref 36.0–46.0)
Hemoglobin: 21.6 g/dL (ref 12.0–15.0)
MCH: 29.3 pg (ref 26.0–34.0)
MCHC: 33.5 g/dL (ref 30.0–36.0)
MCV: 87.5 fL (ref 80.0–100.0)
Platelets: 235 10*3/uL (ref 150–400)
RBC: 7.36 MIL/uL — ABNORMAL HIGH (ref 3.87–5.11)
RDW: 19.7 % — ABNORMAL HIGH (ref 11.5–15.5)
WBC: 10.6 10*3/uL — ABNORMAL HIGH (ref 4.0–10.5)
nRBC: 0.6 % — ABNORMAL HIGH (ref 0.0–0.2)

## 2019-02-07 LAB — ACETAMINOPHEN LEVEL: Acetaminophen (Tylenol), Serum: <10 ug/mL — ABNORMAL LOW (ref 10–30)

## 2019-02-07 LAB — SALICYLATE LEVEL: Salicylate Lvl: <7 mg/dL — ABNORMAL LOW (ref 7.0–30.0)

## 2019-02-07 LAB — ETHANOL: Alcohol, Ethyl (B): <10 mg/dL (ref <10)

## 2019-02-07 LAB — POC SARS CORONAVIRUS 2 AG -  ED: SARS Coronavirus 2 Ag: NEGATIVE

## 2019-02-07 MED ORDER — HEPARIN SODIUM (PORCINE) 5000 UNIT/ML IJ SOLN
5000.0000 [IU] | Freq: Three times a day (TID) | INTRAMUSCULAR | Status: DC
  Administered 2019-02-07 – 2019-02-17 (×28): 5000 [IU] via SUBCUTANEOUS
  Filled 2019-02-07 (×30): qty 1

## 2019-02-07 MED ORDER — CLEVIDIPINE BUTYRATE 0.5 MG/ML IV EMUL
0.0000 mg/h | INTRAVENOUS | Status: DC
  Administered 2019-02-07: 1 mg/h via INTRAVENOUS
  Administered 2019-02-07: 10 mg/h via INTRAVENOUS
  Administered 2019-02-07: 6 mg/h via INTRAVENOUS
  Administered 2019-02-08: 2 mg/h via INTRAVENOUS
  Administered 2019-02-09 (×2): 8 mg/h via INTRAVENOUS
  Administered 2019-02-09: 7 mg/h via INTRAVENOUS
  Administered 2019-02-10: 8 mg/h via INTRAVENOUS
  Filled 2019-02-07 (×2): qty 50
  Filled 2019-02-07: qty 100
  Filled 2019-02-07 (×5): qty 50
  Filled 2019-02-07: qty 100
  Filled 2019-02-07: qty 50
  Filled 2019-02-07: qty 100
  Filled 2019-02-07: qty 50

## 2019-02-07 MED ORDER — DEXMEDETOMIDINE HCL IN NACL 400 MCG/100ML IV SOLN
0.2000 ug/kg/h | INTRAVENOUS | Status: DC
  Administered 2019-02-07: 0.4 ug/kg/h via INTRAVENOUS
  Administered 2019-02-08 – 2019-02-10 (×3): 0.6 ug/kg/h via INTRAVENOUS
  Filled 2019-02-07 (×5): qty 100

## 2019-02-07 MED ORDER — LORAZEPAM 2 MG/ML IJ SOLN
INTRAMUSCULAR | Status: AC
  Administered 2019-02-07: 2 mg
  Filled 2019-02-07: qty 1

## 2019-02-07 MED ORDER — CHLORHEXIDINE GLUCONATE CLOTH 2 % EX PADS
6.0000 | MEDICATED_PAD | Freq: Every day | CUTANEOUS | Status: DC
  Administered 2019-02-07 – 2019-02-12 (×6): 6 via TOPICAL

## 2019-02-07 MED ORDER — SODIUM CHLORIDE 0.9 % IV SOLN
1000.0000 mg | Freq: Once | INTRAVENOUS | Status: AC
  Administered 2019-02-07: 1000 mg via INTRAVENOUS
  Filled 2019-02-07: qty 20

## 2019-02-07 MED ORDER — POTASSIUM CHLORIDE 10 MEQ/100ML IV SOLN
10.0000 meq | INTRAVENOUS | Status: AC
  Administered 2019-02-07 (×4): 10 meq via INTRAVENOUS
  Filled 2019-02-07 (×4): qty 100

## 2019-02-07 MED ORDER — LEVETIRACETAM IN NACL 1000 MG/100ML IV SOLN
1000.0000 mg | INTRAVENOUS | Status: AC
  Administered 2019-02-07 (×2): 1000 mg via INTRAVENOUS
  Filled 2019-02-07: qty 100

## 2019-02-07 MED ORDER — IOHEXOL 350 MG/ML SOLN
100.0000 mL | Freq: Once | INTRAVENOUS | Status: AC | PRN
  Administered 2019-02-07: 100 mL via INTRAVENOUS

## 2019-02-07 MED ORDER — MIDAZOLAM BOLUS VIA INFUSION
10.0000 mg | Freq: Once | INTRAVENOUS | Status: DC
  Filled 2019-02-07: qty 10

## 2019-02-07 MED ORDER — SODIUM CHLORIDE 0.9% FLUSH
3.0000 mL | Freq: Once | INTRAVENOUS | Status: AC
Start: 2019-02-07 — End: 2019-02-07
  Administered 2019-02-07: 09:00:00 3 mL via INTRAVENOUS

## 2019-02-07 MED ORDER — LACTATED RINGERS IV SOLN: INTRAVENOUS | Status: DC

## 2019-02-07 MED ORDER — GADOBUTROL 1 MMOL/ML IV SOLN
5.0000 mL | Freq: Once | INTRAVENOUS | Status: AC | PRN
  Administered 2019-02-07: 5 mL via INTRAVENOUS

## 2019-02-07 MED ORDER — MIDAZOLAM 50MG/50ML (1MG/ML) PREMIX INFUSION
5.0000 mg/h | INTRAVENOUS | Status: DC
  Filled 2019-02-07: qty 50

## 2019-02-07 MED ORDER — LEVETIRACETAM IN NACL 1000 MG/100ML IV SOLN: 1000.0000 mg | Freq: Two times a day (BID) | INTRAVENOUS | Status: DC

## 2019-02-07 MED ORDER — LABETALOL HCL 5 MG/ML IV SOLN
10.0000 mg | Freq: Once | INTRAVENOUS | Status: AC
  Administered 2019-02-07: 10 mg via INTRAVENOUS
  Filled 2019-02-07: qty 4

## 2019-02-07 MED ORDER — LEVETIRACETAM IN NACL 500 MG/100ML IV SOLN
500.0000 mg | Freq: Two times a day (BID) | INTRAVENOUS | Status: DC
  Administered 2019-02-07 – 2019-02-13 (×12): 500 mg via INTRAVENOUS
  Filled 2019-02-07 (×14): qty 100

## 2019-02-07 MED ORDER — LORAZEPAM 2 MG/ML IJ SOLN
1.0000 mg | INTRAMUSCULAR | Status: DC | PRN
  Administered 2019-02-07 – 2019-02-15 (×5): 1 mg via INTRAVENOUS
  Filled 2019-02-07 (×9): qty 1

## 2019-02-07 MED ORDER — PHENYTOIN SODIUM 50 MG/ML IJ SOLN
100.0000 mg | Freq: Three times a day (TID) | INTRAMUSCULAR | Status: DC
  Administered 2019-02-07 – 2019-02-10 (×8): 100 mg via INTRAVENOUS
  Filled 2019-02-07 (×9): qty 2

## 2019-02-07 MED ORDER — MAGNESIUM SULFATE 2 GM/50ML IV SOLN
2.0000 g | Freq: Once | INTRAVENOUS | Status: AC
  Administered 2019-02-07: 2 g via INTRAVENOUS
  Filled 2019-02-07: qty 50

## 2019-02-07 MED ORDER — INSULIN ASPART 100 UNIT/ML ~~LOC~~ SOLN
0.0000 [IU] | SUBCUTANEOUS | Status: DC
  Administered 2019-02-08: 1 [IU] via SUBCUTANEOUS
  Administered 2019-02-11: 5 [IU] via SUBCUTANEOUS
  Administered 2019-02-12 – 2019-02-13 (×3): 1 [IU] via SUBCUTANEOUS

## 2019-02-07 MED ORDER — LORAZEPAM 2 MG/ML IJ SOLN
INTRAMUSCULAR | Status: AC
  Administered 2019-02-07: 09:00:00 2 mg
  Filled 2019-02-07: qty 1

## 2019-02-07 NOTE — Progress Notes (Signed)
vLTM EEG started following spot eeg

## 2019-02-07 NOTE — Code Documentation (Signed)
Stroke RN arrived to Code Stroke at [facility]. Pt was in room 31 with active tremor of the left side. ED RN reported that patient was LKW last night when she went to bed approximately 2100. Pt woke up this morning and complained of a headache. Husband took BP and it read "High." Lisinopril, HCTZ, and Amlodipine taken per husband and EMS called. EMS arrived and patient was Alert and oriented. While getting patient into the truck, pt started to look to the left and have left sided tremor of the left arm and left leg. 2.5 mg of Versed given in transport. When patient arrived to the ED, the patient was actively seizing. Taken to ED 31 given 12 mg of Ativan by Pharmacy and ED. See MAR. 2000 mg of Keppra given by ED Ryan. When this RN arrived, pt had stopped seizing. Pt stabilized and taken to CT. 2 attempts at IV without success. CT negative for hemorrhage. CTA/CTP completed. No acute treatment ordered. Taken back to ED and placed on STAT EEG. EDP Plunkett at the bedside. Handoff given to [facility], ED RN.

## 2019-02-07 NOTE — ED Triage Notes (Signed)
Pt BIB [facility] from home. Family reports pt fine when she went to bed last night at 2100. Pt woke this morning around 0400 complaining of headache. Pt home BP monitor read high. Upon EMS arrival pt responsive, answering questions. Pt became unresponsive on truck and began having seizure like activity. Pt received 2.5 of versed via EMS.

## 2019-02-07 NOTE — Progress Notes (Signed)
RT attempted to place arterial line X2 without any success. Patient is agitated and would not allow for the arterial line to be placed. RN currently giving additional sedation will attempt placement when patient is calm.

## 2019-02-07 NOTE — ED Provider Notes (Addendum)
[facility] EMERGENCY DEPARTMENT Provider Note   CSN: [id number] Arrival date & time: 02/07/19  4481     History No chief complaint on file.   MERLY HINKSON is a 69 y.o. female.  Pt is a 69y/o female with hx of CAD, HTN, HLD, spinal stenosis on daily oxy who is presenting with EMS unresponsive.  Pt went to bed last night normal per husband but he checked on her in the middle of the night and she complained of a headache around 3am and her BP was elevated 200/100 and he gave her blood pressure medicine.  This morning she was less responsive and he called EMS.  When they arrived she was responding minimally to him but appropriate answers.  When she got to the truck she started having full body shaking.  Pt was given 2.5mg  of versed with no change in movements by the time she arrived here.  Husband reports she does drink daily 1-2 40oz beers every night, but had not changed the amount she has drank and does not take benzos.  She has not been ill lately with fever, vomiting, diarrhea, cough or sob.  The history is provided by the EMS personnel and the spouse. The history is limited by the condition of the patient.       Past Medical History:  Diagnosis Date  . 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 Anxiety   . Arthritis   . 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 Benign essential HTN 10/23/2015  . 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 DDD (degenerative disc disease), lumbar 01/10/2011   Noted on MRI  . Depression   . DJD (degenerative joint disease)   . GERD (gastroesophageal reflux disease)   . History of cataract    Bilateral  . Hypercholesterolemia   . Hypertension   . Labial tear 02/25/2018   Noted on MRI: smal Left anterior   . Lumbar spondylosis 01/10/2011   Noted on MRI  . Migraines   . Renal cyst 11/17/2015   Notedon [place] Abd: Simple, Right  . Spinal stenosis 09/06/2016   Noted on MRI:  Moderate-Severe  . Vitreous floater, bilateral     Patient Active Problem List   Diagnosis Date Noted  . Primary osteoarthritis of left hip 06/22/2018  . Benign essential HTN 10/23/2015  . 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 Chest pain, neg MI, neg for pul. embolus, normal ETT 10/22/2015    Past Surgical History:  Procedure Laterality Date  . COLONOSCOPY    . PARTIAL HYSTERECTOMY    . ROTATOR CUFF REPAIR Left   . TONSILLECTOMY    . TOTAL HIP ARTHROPLASTY Left 06/22/2018   Procedure: TOTAL HIP ARTHROPLASTY ANTERIOR APPROACH;  Surgeon: Dorna Leitz, MD;  Location: [facility] ORS;  Service: Orthopedics;  Laterality: Left;     OB History   No obstetric history on file.     No family history on file.  Social History   Tobacco Use  . Smoking status: Current Every Day Smoker    Types: Cigars  . Smokeless tobacco: Never Used  . Tobacco comment: off and on  Substance Use Topics  . Alcohol use: Yes    Comment: every few days, beer  . Drug use: Yes    Types: Marijuana    Comment: last time 06/13/2018    Home Medications Prior to Admission medications   Medication Sig Start  Date End Date Taking? Authorizing Provider  amLODipine (NORVASC) 5 MG tablet Take 5 mg by mouth daily. 11/21/14   [provider]  aspirin EC 325 MG tablet Take 1 tablet (325 mg total) by mouth 2 (two) times daily after a meal. Take x 1 month post op to decrease risk of blood clots. 06/22/18   Gary Fleet, PA-C  docusate sodium (COLACE) 100 MG capsule Take 1 capsule (100 mg total) by mouth 2 (two) times daily. 06/22/18   Gary Fleet, PA-C  GARLIC PO Take 1 tablet by mouth 2 (two) times daily.    [provider]  lisinopril-hydrochlorothiazide (PRINZIDE,ZESTORETIC) 20-25 MG tablet Take 1 tablet by mouth daily. 11/17/14   [provider]  loratadine (CLARITIN) 10 MG tablet Take 10 mg by mouth daily as needed for allergies.    [provider]  Oxycodone HCl 20 MG TABS  Take 1 tablet by mouth 4 (four) times daily.  10/29/14   [provider]  oxyCODONE-acetaminophen (PERCOCET/ROXICET) 5-325 MG tablet Take 1-2 tablets by mouth every 6 (six) hours as needed (for breakthrough pain.). 06/22/18   Gary Fleet, PA-C  pregabalin (LYRICA) 75 MG capsule Take 75 mg by mouth 2 (two) times daily.    [provider]  tiZANidine (ZANAFLEX) 2 MG tablet Take 1 tablet (2 mg total) by mouth every 8 (eight) hours as needed for muscle spasms. 06/22/18   Gary Fleet, PA-C  traZODone (DESYREL) 50 MG tablet Take 50 mg by mouth at bedtime. 11/18/14   [provider]    Allergies    Nsaids  Review of Systems   Review of Systems  Unable to perform ROS: Patient unresponsive    Physical Exam Updated Vital Signs There were no vitals taken for this visit.  Physical Exam Vitals reviewed.  Constitutional:      General: She is in acute distress.     Appearance: She is normal weight. She is diaphoretic.     Comments: unresponsive  HENT:     Nose: Nose normal.     Mouth/Throat:     Mouth: Mucous membranes are moist.     Comments: Jaw clenched Eyes:     Pupils: Pupils are equal, round, and reactive to light.     Comments: Eyes deviated to the left  Cardiovascular:     Rate and Rhythm: Regular rhythm. Tachycardia present.     Pulses: Normal pulses.     Heart sounds: Normal heart sounds.  Pulmonary:     Effort: Pulmonary effort is normal.     Breath sounds: Normal breath sounds. No wheezing, rhonchi or rales.  Abdominal:     General: Abdomen is flat.     Palpations: Abdomen is soft.  Musculoskeletal:     Right lower leg: No edema.     Left lower leg: No edema.  Skin:    General: Skin is warm.     Capillary Refill: Capillary refill takes less than 2 seconds.  Neurological:     Mental Status: She is unresponsive.     Comments: Eyes are blinking and deviated to the left.  Legs and hand twitching.  Not responding to voice or pain.  Clonus  present     ED Results / Procedures / Treatments   Labs (all labs ordered are listed, but only abnormal results are displayed) Labs Reviewed  CBC - Abnormal; Notable for the following components:      Result Value   WBC 10.6 (*)    RBC 7.36 (*)  Hemoglobin 21.6 (*)    HCT 64.4 (*)    RDW 19.7 (*)    nRBC 0.6 (*)    All other components within normal limits  COMPREHENSIVE METABOLIC PANEL - Abnormal; Notable for the following components:   Potassium 3.4 (*)    CO2 19 (*)    Glucose, Bld 199 (*)    Creatinine, Ser 1.34 (*)    Calcium 10.4 (*)    Total Protein 8.2 (*)    GFR calc non Af Amer 40 (*)    GFR calc Af Amer 46 (*)    Anion gap 16 (*)    All other components within normal limits  SALICYLATE LEVEL - Abnormal; Notable for the following components:   Salicylate Lvl <7.5 (*)    All other components within normal limits  ACETAMINOPHEN LEVEL - Abnormal; Notable for the following components:   Acetaminophen (Tylenol), Serum <10 (*)    All other components within normal limits  MAGNESIUM - Abnormal; Notable for the following components:   Magnesium 1.5 (*)    All other components within normal limits  PHENYTOIN LEVEL, TOTAL - Abnormal; Notable for the following components:   Phenytoin Lvl <2.5 (*)    All other components within normal limits  CBG MONITORING, ED - Abnormal; Notable for the following components:   Glucose-Capillary 172 (*)    All other components within normal limits  RESPIRATORY PANEL BY RT PCR (FLU A&B, COVID)  DIFFERENTIAL  ETHANOL  PHOSPHORUS  I-STAT CHEM 8, ED  CBG MONITORING, ED  POC SARS CORONAVIRUS 2 AG -  ED    EKG EKG Interpretation  Date/Time:  Thursday February 07 2019 07:47:52 EST Ventricular Rate:  121 PR Interval:    QRS Duration: 87 QT Interval:  319 QTC Calculation: 453 R Axis:   20 Text Interpretation: Sinus tachycardia Paired ventricular premature complexes Consider right atrial enlargement Probable left ventricular  hypertrophy Nonspecific T abnormalities, lateral leads Baseline wander in lead(s) III aVL Confirmed by Blanchie Dessert ([id number]) on 02/07/2019 7:59:49 AM   Radiology EEG  Result Date: 02/07/2019 Lora Havens, MD     02/07/2019 10:59 AM Patient Name: ANDRELL TALLMAN MRN: [id number] Epilepsy Attending: Lora Havens Referring Physician/Provider: Dr. Kerney Elbe Date: 1/40/2021 Duration: 23.36 minutes Patient history: 69 year old female presented with altered mental status and seizure.  EEG evaluate for status epilepticus. Level of alertness: Comatose AEDs during EEG study: Ativan, Keppra, fosphenytoin Technical aspects: This EEG study was done with scalp electrodes positioned according to the 10-20 International system of electrode placement. Electrical activity was acquired at a sampling rate of 500Hz  and reviewed with a high frequency filter of 70Hz  and a low frequency filter of 1Hz . EEG data were recorded continuously and digitally stored. 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  Although it was difficult to visualize on camera, per neurology team patient had rhythmic jerking of left upper extremity and left gaze deviation.  EEG also showed continuous generalized 3-5 hz theta-delta slowing.  Hyperventilation and photic stimulation were not performed. Abnormality -Focal status epilepticus, right temporoparietal region. 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  Additionally there is evidence of severe diffuse encephalopathy. Dr. Cheral Marker was immediately notified.   CT ANGIO HEAD W OR WO CONTRAST  Result Date: 02/07/2019 CLINICAL DATA:  Focal neurological deficit. Severe headache and possible seizures. EXAM: CT ANGIOGRAPHY HEAD AND NECK CT PERFUSION BRAIN TECHNIQUE: Multidetector CT imaging of  the head and neck was performed using the standard protocol  during bolus administration of intravenous contrast. Multiplanar CT image reconstructions and MIPs were obtained to evaluate the vascular anatomy. Carotid stenosis measurements (when applicable) are obtained utilizing NASCET criteria, using the distal internal carotid diameter as the denominator. Multiphase CT imaging of the brain was performed following IV bolus contrast injection. Subsequent parametric perfusion maps were calculated using RAPID software. CONTRAST:  112mL OMNIPAQUE IOHEXOL 350 MG/ML SOLN COMPARISON:  Head CT earlier same day FINDINGS: CTA NECK FINDINGS Aortic arch: Mild aortic atherosclerosis. No aneurysm or dissection. Branching pattern is normal without origin stenosis. Right carotid system: Common carotid artery widely patent to the bifurcation. Soft and calcified plaque of the ICA bulb but no stenosis compared to the more distal cervical ICA. Left carotid system: Common carotid artery widely patent to the bifurcation. Soft and calcified plaque at the bifurcation and ICA bulb. Minimal diameter of the ICA bulb is 3.5 mm. Compared to a more distal cervical ICA diameter of the same, there is no stenosis. Vertebral arteries: Both vertebral artery origins are widely patent without stenosis. Both vertebral arteries appear normal through the cervical region. Skeleton: Ordinary cervical spondylosis. Other neck: Thyroid goiter without dominant nodule. Upper chest: Normal Review of the MIP images confirms the above findings CTA HEAD FINDINGS Anterior circulation: Both internal carotid arteries are patent through the skull base and siphon regions. There is ordinary atherosclerotic calcification affecting the carotid siphons but without stenosis greater than 30%. The anterior and middle cerebral vessels are patent without large or medium vessel occlusion, proximal stenosis, aneurysm or vascular malformation. Posterior circulation: Both vertebral arteries are patent through the foramen magnum to the basilar.  No basilar stenosis. Posterior circulation branch vessels are patent. Venous sinuses: Patent and normal. Anatomic variants: None significant. Review of the MIP images confirms the above findings CT Brain Perfusion Findings: ASPECTS: 10 CBF (<30%) Volume: 76mL Perfusion (Tmax>6.0s) volume: 64mL Mismatch Volume: 75mL Infarction Location:None IMPRESSION: No acute finding.  Negative perfusion evaluation. No large or medium vessel occlusion. Atherosclerotic disease at both carotid bifurcations but without stenosis. Atherosclerotic disease in both carotid siphon regions but without narrowing greater than 30%. These results were communicated to Dr. Cheral Marker at 9:16 amon 1/14/2021by text page via the [facility] messaging system. Electronically Signed   By: Nelson Chimes M.D.   On: 02/07/2019 09:18   CT ANGIO NECK W OR WO CONTRAST  Result Date: 02/07/2019 CLINICAL DATA:  Focal neurological deficit. Severe headache and possible seizures. EXAM: CT ANGIOGRAPHY HEAD AND NECK CT PERFUSION BRAIN TECHNIQUE: Multidetector CT imaging of the head and neck was performed using the standard protocol during bolus administration of intravenous contrast. Multiplanar CT image reconstructions and MIPs were obtained to evaluate the vascular anatomy. Carotid stenosis measurements (when applicable) are obtained utilizing NASCET criteria, using the distal internal carotid diameter as the denominator. Multiphase CT imaging of the brain was performed following IV bolus contrast injection. Subsequent parametric perfusion maps were calculated using RAPID software. CONTRAST:  152mL OMNIPAQUE IOHEXOL 350 MG/ML SOLN COMPARISON:  Head CT earlier same day FINDINGS: CTA NECK FINDINGS Aortic arch: Mild aortic atherosclerosis. No aneurysm or dissection. Branching pattern is normal without origin stenosis. Right carotid system: Common carotid artery widely patent to the bifurcation. Soft and calcified plaque of the ICA bulb but no stenosis compared to the more  distal cervical ICA. Left carotid system: Common carotid artery widely patent to the bifurcation. Soft and calcified plaque at the bifurcation and ICA bulb. Minimal  diameter of the ICA bulb is 3.5 mm. Compared to a more distal cervical ICA diameter of the same, there is no stenosis. Vertebral arteries: Both vertebral artery origins are widely patent without stenosis. Both vertebral arteries appear normal through the cervical region. Skeleton: Ordinary cervical spondylosis. Other neck: Thyroid goiter without dominant nodule. Upper chest: Normal Review of the MIP images confirms the above findings CTA HEAD FINDINGS Anterior circulation: Both internal carotid arteries are patent through the skull base and siphon regions. There is ordinary atherosclerotic calcification affecting the carotid siphons but without stenosis greater than 30%. The anterior and middle cerebral vessels are patent without large or medium vessel occlusion, proximal stenosis, aneurysm or vascular malformation. Posterior circulation: Both vertebral arteries are patent through the foramen magnum to the basilar. No basilar stenosis. Posterior circulation branch vessels are patent. Venous sinuses: Patent and normal. Anatomic variants: None significant. Review of the MIP images confirms the above findings CT Brain Perfusion Findings: ASPECTS: 10 CBF (<30%) Volume: 68mL Perfusion (Tmax>6.0s) volume: 76mL Mismatch Volume: 34mL Infarction Location:None IMPRESSION: No acute finding.  Negative perfusion evaluation. No large or medium vessel occlusion. Atherosclerotic disease at both carotid bifurcations but without stenosis. Atherosclerotic disease in both carotid siphon regions but without narrowing greater than 30%. These results were communicated to Dr. Cheral Marker at 9:16 amon 1/14/2021by text page via the [facility] messaging system. Electronically Signed   By: Nelson Chimes M.D.   On: 02/07/2019 09:18   MR BRAIN W WO CONTRAST  Result Date: 02/07/2019 CLINICAL  DATA:  Severe headache. Possible seizure. Abnormal neurological exam. EXAM: MRI HEAD WITHOUT AND WITH CONTRAST TECHNIQUE: Multiplanar, multiecho pulse sequences of the brain and surrounding structures were obtained without and with intravenous contrast. CONTRAST:  65mL GADAVIST GADOBUTROL 1 MMOL/ML IV SOLN COMPARISON:  CT studies earlier same day FINDINGS: Brain: Diffusion imaging does not show any acute or subacute infarction. Minimal small vessel ischemic change of the pons. No cerebellar abnormality. Cerebral hemispheres show abnormal cortical and subcortical edema in the occipital and posterior parietal regions, with some involvement of the splenium of the corpus callosum. Findings likely to indicate posterior reversible encephalopathy. Patchy abnormal contrast enhancement in that region. Findings are most consistent with a subacute phase of posterior reversible encephalopathy. I did consider other diagnoses including vascular lymphoma or atypical infections with an unusual distribution but think those are un likely. No hemorrhage, hydrocephalus or extra-axial fluid collection. No evidence of pre-existing significant small-vessel disease. Vascular: Major vessels at the base of the brain show flow. Skull and upper cervical spine: Negative Sinuses/Orbits: Clear/normal Other: None IMPRESSION: Abnormal cortical and subcortical edema in the occipital and posterior parietal brain bilaterally, with involvement also of the splenium of the corpus callosum. Patchy/speckled contrast enhancement. Think the findings are most consistent with posterior reversible encephalopathy in the subacute phase. See above discussion. Electronically Signed   By: Nelson Chimes M.D.   On: 02/07/2019 11:25   CT CEREBRAL PERFUSION W CONTRAST  Result Date: 02/07/2019 CLINICAL DATA:  Focal neurological deficit. Severe headache and possible seizures. EXAM: CT ANGIOGRAPHY HEAD AND NECK CT PERFUSION BRAIN TECHNIQUE: Multidetector CT imaging of  the head and neck was performed using the standard protocol during bolus administration of intravenous contrast. Multiplanar CT image reconstructions and MIPs were obtained to evaluate the vascular anatomy. Carotid stenosis measurements (when applicable) are obtained utilizing NASCET criteria, using the distal internal carotid diameter as the denominator. Multiphase CT imaging of the brain was performed following IV bolus contrast injection. Subsequent parametric perfusion maps  were calculated using RAPID software. CONTRAST:  152mL OMNIPAQUE IOHEXOL 350 MG/ML SOLN COMPARISON:  Head CT earlier same day FINDINGS: CTA NECK FINDINGS Aortic arch: Mild aortic atherosclerosis. No aneurysm or dissection. Branching pattern is normal without origin stenosis. Right carotid system: Common carotid artery widely patent to the bifurcation. Soft and calcified plaque of the ICA bulb but no stenosis compared to the more distal cervical ICA. Left carotid system: Common carotid artery widely patent to the bifurcation. Soft and calcified plaque at the bifurcation and ICA bulb. Minimal diameter of the ICA bulb is 3.5 mm. Compared to a more distal cervical ICA diameter of the same, there is no stenosis. Vertebral arteries: Both vertebral artery origins are widely patent without stenosis. Both vertebral arteries appear normal through the cervical region. Skeleton: Ordinary cervical spondylosis. Other neck: Thyroid goiter without dominant nodule. Upper chest: Normal Review of the MIP images confirms the above findings CTA HEAD FINDINGS Anterior circulation: Both internal carotid arteries are patent through the skull base and siphon regions. There is ordinary atherosclerotic calcification affecting the carotid siphons but without stenosis greater than 30%. The anterior and middle cerebral vessels are patent without large or medium vessel occlusion, proximal stenosis, aneurysm or vascular malformation. Posterior circulation: Both vertebral  arteries are patent through the foramen magnum to the basilar. No basilar stenosis. Posterior circulation branch vessels are patent. Venous sinuses: Patent and normal. Anatomic variants: None significant. Review of the MIP images confirms the above findings CT Brain Perfusion Findings: ASPECTS: 10 CBF (<30%) Volume: 42mL Perfusion (Tmax>6.0s) volume: 8mL Mismatch Volume: 32mL Infarction Location:None IMPRESSION: No acute finding.  Negative perfusion evaluation. No large or medium vessel occlusion. Atherosclerotic disease at both carotid bifurcations but without stenosis. Atherosclerotic disease in both carotid siphon regions but without narrowing greater than 30%. These results were communicated to Dr. Cheral Marker at 9:16 amon 1/14/2021by text page via the [facility] messaging system. Electronically Signed   By: Nelson Chimes M.D.   On: 02/07/2019 09:18   CT HEAD CODE STROKE WO CONTRAST  Result Date: 02/07/2019 CLINICAL DATA:  Code stroke.  Severe headache with seizures. EXAM: CT HEAD WITHOUT CONTRAST TECHNIQUE: Contiguous axial images were obtained from the base of the skull through the vertex without intravenous contrast. COMPARISON:  None. FINDINGS: Brain: No evidence of acute infarction, hemorrhage, hydrocephalus, extra-axial collection or mass lesion/mass effect. Vascular: No convincing hyperdense vessel when accounting for streak artifact. Skull: Negative for fracture or bone lesion. Sinuses/Orbits: Negative Other: These results were communicated to Dr. Cheral Marker at 8:36 amon 1/14/2021by text page via the [facility] messaging system. ASPECTS [facility] Stroke Program Early CT Score) Not scored with this history IMPRESSION: No acute finding. Electronically Signed   By: Monte Fantasia M.D.   On: 02/07/2019 08:37    Procedures Procedures (including critical care time)  Medications Ordered in ED Medications  sodium chloride flush (NS) 0.9 % injection 3 mL (has no administration in time range)  LORazepam (ATIVAN) 2 MG/ML  injection (has no administration in time range)  levETIRAcetam (KEPPRA) IVPB 1000 mg/100 mL premix (has no administration in time range)  levETIRAcetam (KEPPRA) IVPB 1000 mg/100 mL premix (has no administration in time range)  LORazepam (ATIVAN) 2 MG/ML injection (has no administration in time range)  LORazepam (ATIVAN) 2 MG/ML injection (has no administration in time range)  LORazepam (ATIVAN) 2 MG/ML injection (has no administration in time range)  LORazepam (ATIVAN) 2 MG/ML injection (has no administration in time range)    ED Course  I have reviewed the  triage vital signs and the nursing notes.  Pertinent labs & imaging results that were available during my care of the patient were reviewed by me and considered in my medical decision making (see chart for details).    MDM Rules/Calculators/A&P                      Elderly female with hx of STEMI/CAD who smokes cigarettes and has hx of HTN presenting today with persistent seizing full body and unresponsive.  No hx of sz in the past and no hx of trauma.  Pt unresponsive  And seizing on arrival here.  Non-focal exam.  Pt received 12 of ativan and 2000 of keppra with ongoing subtle sz activity.  Code stroke called prior to arrival and Neuro at bedside. Blood sugar wnl.  Hb elevated to 21 without obvious signs of dehydration and concern for possible polycythemia.   Pt continues to maintain airway with normal sats.  Will continue to treat sz. 9:17 AM Patient's head CT is negative for acute bleed or stroke.  CMP with creatinine of 1.3 anion gap of 16 and suspect patient most likely has a lactic acidosis from her ongoing seizing.  On return to the room patient is now seizing more predominantly on the right.  She was given 2 more of Ativan equaling a total of 14 mg of Ativan and then she was started on fosphenytoin as when EEG was placed she was still having ongoing seizing activity.  Her airway remains intact.  If seizing does not relent after the  fosphenytoin we will need to intubate the patient because she will need sedating drugs that will affect her breathing.  12:18 PM CTA is negative for acute pathology.  MRI shows abnormal cortical and subcortical edema in the occipital and posterior parietal brain bilaterally as well as patchy speckled contrast enhancement most consistent with PRES.  If patient remains hypertensive we will consider starting clevidipine.  CRITICAL CARE Performed by: Ahlam Piscitelli Total critical care time: 45 minutes Critical care time was exclusive of separately billable procedures and treating other patients. Critical care was necessary to treat or prevent imminent or life-threatening deterioration. Critical care was time spent personally by me on the following activities: development of treatment plan with patient and/or surrogate as well as nursing, discussions with consultants, evaluation of patient's response to treatment, examination of patient, obtaining history from patient or surrogate, ordering and performing treatments and interventions, ordering and review of laboratory studies, ordering and review of radiographic studies, pulse oximetry and re-evaluation of patient's condition.  Final Clinical Impression(s) / ED Diagnoses Final diagnoses:  Status epilepticus ([place])  Posterior reversible encephalopathy syndrome    Rx / DC Orders ED Discharge Orders    None       Blanchie Dessert, MD 02/07/19 1497    Blanchie Dessert, MD 02/07/19 1220

## 2019-02-07 NOTE — Consult Note (Signed)
NEURO HOSPITALIST CONSULT NOTE   Requestig physician: Dr. Maryan Rued  Reason for Consult: Status epilepticus  History obtained from:  EMS and Chart     HPI:                                                                                                                                          Judy Chang is an 69 y.o. female with a history of CAD, HTN, HLD and spinal stenosis on daily oxycodone who presents to the ED via EMS with ongoing seizure activity. She was LKN "sometime last night" before going to bed. She awoke at about 3 AM and was complaining of a headache. Her BP was elevated at 200/100 and he gave her blood pressure medicine. Later in the AM she was less responsive and at that point EMS was called by husband. On arrival, she was attending only to her husband and ignoring queries by EMS. This progressed to an inability to attend to husband as they were loading her into the ambulance. She then became unresponsive. She started having diffuse tremors and then clear seizure activity began to manifest, mostly left sided. Her BP was 280/120. CBG was in the 80's. Midazolam 2.5 mg was administered by EMS without cessation of activity.   Husband reports she drinks every night 1-2 40oz beers, but had not changed the amount she has been drinking and does not take benzos.  She has not been ill lately with fever, vomiting, diarrhea, cough or SOB.  On arrival to the ED, she continued to exhibit seizure activity and was unresponsive. Head was rotated to the left with jerking. Eyes deviated to the left. Left lower extremity with jerking activity. Slight jerking on the right as well. No RUE jerking. 2 mg doses of Ativan were successively administered in the ED for a total of  6 doses, with cessation of clinical seizure activity after the last dose.   Per husband, she has no history of seizures.   Past Medical History:  Diagnosis Date  . 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 Anxiety   . Arthritis   . 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 Benign essential HTN 10/23/2015  . 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 DDD (degenerative disc disease), lumbar 01/10/2011   Noted on MRI  . Depression   . DJD (degenerative joint disease)   . GERD (gastroesophageal reflux disease)   . History of cataract    Bilateral  . Hypercholesterolemia   . Hypertension   . Labial tear 02/25/2018   Noted on MRI: smal Left anterior   . Lumbar spondylosis 01/10/2011   Noted on MRI  . Migraines   . Renal cyst  11/17/2015   Notedon [place] Abd: Simple, Right  . Spinal stenosis 09/06/2016   Noted on MRI: Moderate-Severe  . Vitreous floater, bilateral     Past Surgical History:  Procedure Laterality Date  . COLONOSCOPY    . PARTIAL HYSTERECTOMY    . ROTATOR CUFF REPAIR Left   . TONSILLECTOMY    . TOTAL HIP ARTHROPLASTY Left 06/22/2018   Procedure: TOTAL HIP ARTHROPLASTY ANTERIOR APPROACH;  Surgeon: Dorna Leitz, MD;  Location: [facility];  Service: Orthopedics;  Laterality: Left;    Family History  Problem Relation Age of Onset  . Hypertension Mother   . Hypertension Father             Social History:  reports that she has been smoking cigars. She has never used smokeless tobacco. She reports current alcohol use. She reports current drug use. Drug: Marijuana.  Allergies  Allergen Reactions  . Nsaids Rash    MEDICATIONS:                                                                                                                     Scheduled: . midazolam  10 mg Intravenous Once   Continuous: . levETIRAcetam    . midazolam       ROS:                                                                                                                                       History obtained from unobtainable from patient due to mental status   General Examination:                                                                                                        Physical Exam  HEENT-  Normocephalic, no lesions, without obvious abnormality.  Normal external eye and conjunctiva.   Cardiovascular- S1-S2 audible, pulses palpable throughout   Lungs-no rhonchi or wheezing noted, no excessive working breathing.  Saturations within normal limits Abdomen- All 4 quadrants palpated and  nontender Extremities- Warm, dry and intact Musculoskeletal-no joint tenderness, deformity or swelling Skin-warm and dry, no hyperpigmentation, vitiligo, or suspicious lesions  Neurological Examination Mental Status: During seizure activity, patient does not respond to verbal stimuli.  Does not respond to deep sternal rub.  Does not follow commands. No verbalizations noted. No change to decreased responsiveness after cessation of seizure activity with anticonvulsants.  Cranial Nerves: II: Patient does not respond to visual confrontation bilaterally,  III,IV,VI: Doll's eye reflexes present bilaterally. Right pupil 2 mm, left 2 mm, reactive bilaterally V,VII: Corneal reflexes present bilaterally  VIII: Patient does not respond to verbal stimuli IX,X: Gag reflex present Motor: Extremities flaccid throughout, alternating with intermittent increased tone and rhythmic jerking, worse on the left. No purposeful movements noted. After cessation of seizure activity, there were some nonpurposeful fidgeting-like movements of feet, right more than left.  Sensory: Does not respond to noxious stimuli in any extremity. Deep Tendon Reflexes: 2+ bicep and KJ Plantars: upgoing bilaterally Cerebellar/Gait: Unable to assess    Lab Results: Basic Metabolic Panel: Recent Labs  Lab 02/07/19 0744  NA 137  K 3.4*  CL 102  CO2 19*  GLUCOSE 199*  BUN 12  CREATININE 1.34*  CALCIUM 10.4*    CBC: Recent Labs  Lab 02/07/19 0744  WBC 10.6*  NEUTROABS 7.3  HGB 21.6*  HCT 64.4*  MCV 87.5  PLT 235   Imaging: CT ANGIO HEAD W OR WO CONTRAST  Result  Date: 02/07/2019 CLINICAL DATA:  Focal neurological deficit. Severe headache and possible seizures. EXAM: CT ANGIOGRAPHY HEAD AND NECK CT PERFUSION BRAIN TECHNIQUE: Multidetector CT imaging of the head and neck was performed using the standard protocol during bolus administration of intravenous contrast. Multiplanar CT image reconstructions and MIPs were obtained to evaluate the vascular anatomy. Carotid stenosis measurements (when applicable) are obtained utilizing NASCET criteria, using the distal internal carotid diameter as the denominator. Multiphase CT imaging of the brain was performed following IV bolus contrast injection. Subsequent parametric perfusion maps were calculated using RAPID software. CONTRAST:  1101mL OMNIPAQUE IOHEXOL 350 MG/ML SOLN COMPARISON:  Head CT earlier same day FINDINGS: CTA NECK FINDINGS Aortic arch: Mild aortic atherosclerosis. No aneurysm or dissection. Branching pattern is normal without origin stenosis. Right carotid system: Common carotid artery widely patent to the bifurcation. Soft and calcified plaque of the ICA bulb but no stenosis compared to the more distal cervical ICA. Left carotid system: Common carotid artery widely patent to the bifurcation. Soft and calcified plaque at the bifurcation and ICA bulb. Minimal diameter of the ICA bulb is 3.5 mm. Compared to a more distal cervical ICA diameter of the same, there is no stenosis. Vertebral arteries: Both vertebral artery origins are widely patent without stenosis. Both vertebral arteries appear normal through the cervical region. Skeleton: Ordinary cervical spondylosis. Other neck: Thyroid goiter without dominant nodule. Upper chest: Normal Review of the MIP images confirms the above findings CTA HEAD FINDINGS Anterior circulation: Both internal carotid arteries are patent through the skull base and siphon regions. There is ordinary atherosclerotic calcification affecting the carotid siphons but without stenosis greater than  30%. The anterior and middle cerebral vessels are patent without large or medium vessel occlusion, proximal stenosis, aneurysm or vascular malformation. Posterior circulation: Both vertebral arteries are patent through the foramen magnum to the basilar. No basilar stenosis. Posterior circulation branch vessels are patent. Venous sinuses: Patent and normal. Anatomic variants: None significant. Review of the MIP images confirms the above findings CT Brain Perfusion Findings: ASPECTS:  10 CBF (<30%) Volume: 87mL Perfusion (Tmax>6.0s) volume: 26mL Mismatch Volume: 84mL Infarction Location:None IMPRESSION: No acute finding.  Negative perfusion evaluation. No large or medium vessel occlusion. Atherosclerotic disease at both carotid bifurcations but without stenosis. Atherosclerotic disease in both carotid siphon regions but without narrowing greater than 30%. These results were communicated to Dr. Cheral Marker at 9:16 amon 1/14/2021by text page via the [facility] messaging system. Electronically Signed   By: Nelson Chimes M.D.   On: 02/07/2019 09:18   CT ANGIO NECK W OR WO CONTRAST  Result Date: 02/07/2019 CLINICAL DATA:  Focal neurological deficit. Severe headache and possible seizures. EXAM: CT ANGIOGRAPHY HEAD AND NECK CT PERFUSION BRAIN TECHNIQUE: Multidetector CT imaging of the head and neck was performed using the standard protocol during bolus administration of intravenous contrast. Multiplanar CT image reconstructions and MIPs were obtained to evaluate the vascular anatomy. Carotid stenosis measurements (when applicable) are obtained utilizing NASCET criteria, using the distal internal carotid diameter as the denominator. Multiphase CT imaging of the brain was performed following IV bolus contrast injection. Subsequent parametric perfusion maps were calculated using RAPID software. CONTRAST:  148mL OMNIPAQUE IOHEXOL 350 MG/ML SOLN COMPARISON:  Head CT earlier same day FINDINGS: CTA NECK FINDINGS Aortic arch: Mild aortic  atherosclerosis. No aneurysm or dissection. Branching pattern is normal without origin stenosis. Right carotid system: Common carotid artery widely patent to the bifurcation. Soft and calcified plaque of the ICA bulb but no stenosis compared to the more distal cervical ICA. Left carotid system: Common carotid artery widely patent to the bifurcation. Soft and calcified plaque at the bifurcation and ICA bulb. Minimal diameter of the ICA bulb is 3.5 mm. Compared to a more distal cervical ICA diameter of the same, there is no stenosis. Vertebral arteries: Both vertebral artery origins are widely patent without stenosis. Both vertebral arteries appear normal through the cervical region. Skeleton: Ordinary cervical spondylosis. Other neck: Thyroid goiter without dominant nodule. Upper chest: Normal Review of the MIP images confirms the above findings CTA HEAD FINDINGS Anterior circulation: Both internal carotid arteries are patent through the skull base and siphon regions. There is ordinary atherosclerotic calcification affecting the carotid siphons but without stenosis greater than 30%. The anterior and middle cerebral vessels are patent without large or medium vessel occlusion, proximal stenosis, aneurysm or vascular malformation. Posterior circulation: Both vertebral arteries are patent through the foramen magnum to the basilar. No basilar stenosis. Posterior circulation branch vessels are patent. Venous sinuses: Patent and normal. Anatomic variants: None significant. Review of the MIP images confirms the above findings CT Brain Perfusion Findings: ASPECTS: 10 CBF (<30%) Volume: 21mL Perfusion (Tmax>6.0s) volume: 30mL Mismatch Volume: 24mL Infarction Location:None IMPRESSION: No acute finding.  Negative perfusion evaluation. No large or medium vessel occlusion. Atherosclerotic disease at both carotid bifurcations but without stenosis. Atherosclerotic disease in both carotid siphon regions but without narrowing greater  than 30%. These results were communicated to Dr. Cheral Marker at 9:16 amon 1/14/2021by text page via the [facility] messaging system. Electronically Signed   By: Nelson Chimes M.D.   On: 02/07/2019 09:18   CT CEREBRAL PERFUSION W CONTRAST  Result Date: 02/07/2019 CLINICAL DATA:  Focal neurological deficit. Severe headache and possible seizures. EXAM: CT ANGIOGRAPHY HEAD AND NECK CT PERFUSION BRAIN TECHNIQUE: Multidetector CT imaging of the head and neck was performed using the standard protocol during bolus administration of intravenous contrast. Multiplanar CT image reconstructions and MIPs were obtained to evaluate the vascular anatomy. Carotid stenosis measurements (when applicable) are obtained utilizing NASCET criteria,  using the distal internal carotid diameter as the denominator. Multiphase CT imaging of the brain was performed following IV bolus contrast injection. Subsequent parametric perfusion maps were calculated using RAPID software. CONTRAST:  135mL OMNIPAQUE IOHEXOL 350 MG/ML SOLN COMPARISON:  Head CT earlier same day FINDINGS: CTA NECK FINDINGS Aortic arch: Mild aortic atherosclerosis. No aneurysm or dissection. Branching pattern is normal without origin stenosis. Right carotid system: Common carotid artery widely patent to the bifurcation. Soft and calcified plaque of the ICA bulb but no stenosis compared to the more distal cervical ICA. Left carotid system: Common carotid artery widely patent to the bifurcation. Soft and calcified plaque at the bifurcation and ICA bulb. Minimal diameter of the ICA bulb is 3.5 mm. Compared to a more distal cervical ICA diameter of the same, there is no stenosis. Vertebral arteries: Both vertebral artery origins are widely patent without stenosis. Both vertebral arteries appear normal through the cervical region. Skeleton: Ordinary cervical spondylosis. Other neck: Thyroid goiter without dominant nodule. Upper chest: Normal Review of the MIP images confirms the above  findings CTA HEAD FINDINGS Anterior circulation: Both internal carotid arteries are patent through the skull base and siphon regions. There is ordinary atherosclerotic calcification affecting the carotid siphons but without stenosis greater than 30%. The anterior and middle cerebral vessels are patent without large or medium vessel occlusion, proximal stenosis, aneurysm or vascular malformation. Posterior circulation: Both vertebral arteries are patent through the foramen magnum to the basilar. No basilar stenosis. Posterior circulation branch vessels are patent. Venous sinuses: Patent and normal. Anatomic variants: None significant. Review of the MIP images confirms the above findings CT Brain Perfusion Findings: ASPECTS: 10 CBF (<30%) Volume: 70mL Perfusion (Tmax>6.0s) volume: 59mL Mismatch Volume: 88mL Infarction Location:None IMPRESSION: No acute finding.  Negative perfusion evaluation. No large or medium vessel occlusion. Atherosclerotic disease at both carotid bifurcations but without stenosis. Atherosclerotic disease in both carotid siphon regions but without narrowing greater than 30%. These results were communicated to Dr. Cheral Marker at 9:16 amon 1/14/2021by text page via the [facility] messaging system. Electronically Signed   By: Nelson Chimes M.D.   On: 02/07/2019 09:18   CT HEAD CODE STROKE WO CONTRAST  Result Date: 02/07/2019 CLINICAL DATA:  Code stroke.  Severe headache with seizures. EXAM: CT HEAD WITHOUT CONTRAST TECHNIQUE: Contiguous axial images were obtained from the base of the skull through the vertex without intravenous contrast. COMPARISON:  None. FINDINGS: Brain: No evidence of acute infarction, hemorrhage, hydrocephalus, extra-axial collection or mass lesion/mass effect. Vascular: No convincing hyperdense vessel when accounting for streak artifact. Skull: Negative for fracture or bone lesion. Sinuses/Orbits: Negative Other: These results were communicated to Dr. Cheral Marker at 8:36 amon 1/14/2021by  text page via the [facility] messaging system. ASPECTS [facility] Stroke Program Early CT Score) Not scored with this history IMPRESSION: No acute finding. Electronically Signed   By: Monte Fantasia M.D.   On: 02/07/2019 08:37     Assessment: 69 year old female presenting after having symptoms of severe headache followed by onset of seizure activity evolving to refractory status epilepticus. No history of seizures.  1. Midazolam 2.5 mg was administered by EMS and 12 of Ativan were given in the ED before cessation of seizure activity. 2000 mg IV Keppra load was also administered while titrating Ativan.  2. For evaluation of possible ICH or stroke, CT head was then obtained. While in CT the patient started seizing again requiring additional 2 mg Ativan followed by an IV load of fosphenytoin.  3. Presentation not  likely due to EtOH withdrawal, as per family, although she has a heavy drinking habit, she has continued to drink - no abrupt cessation. Also with no use of benzodiazepines at home per family.  4. DDx includes PRES, RCVS, acute stroke precipitating seizure and hypertensive encephalopathy.  5. Spot EEG: This study showed evidence of focal status epilepticus arising from right temporoparietal region.  Additionally there is evidence of severe diffuse encephalopathy.     Recommendations: 1. Switching spot EEG to LTM 2. Maintenance Keppra 500 mg IV BID and dilantin 100 mg Q8 hours 3. 2 mg Ativan if seizure returns, and call neurologist on call  4. Inpatient seizure precautions  5. After discharge will need follow up with neurologist .   Addendum:    -- MRI brain completed, revealing PRES: Abnormal cortical and subcortical edema in the occipital and posterior parietal brain bilaterally, with involvement also of the splenium of the corpus callosum. Patchy/speckled contrast enhancement. The findings are most consistent with posterior reversible encephalopathy in the subacute phase. . -- Clevidipine gtt  for BP management towards SBP < 120 -- ICU team is admitting for BP control and vent management. Will need frequent neuro checks.  -- Preliminary review of LTM at 1 PM: Sharp waves seen in right frontotemporal region, maximal F8, no seizures noted.  -- LTM EEG reviewed till 1515. Sharp waves again seen in right frontotemporal region, maximal F8, no seizures noted.  -- Seizure provoked by PRES. Not consistent with epilepsy unless she has recurrent seizures following resolution of PRES.  -- Magnesium is low at 1.5. Pharmacy has been called to dose magnesium sulfate to normalization of Mg level.    A total of 90 minutes was spent in the emergent neurological evaluation and management of this critically ill patient  Electronically signed: Dr. Kerney Elbe  02/07/2019, 9:36 AM

## 2019-02-07 NOTE — Progress Notes (Signed)
STAT EEG complete. Notified neuro. LTM to follow

## 2019-02-07 NOTE — Plan of Care (Signed)
LTM eg reviewed till 1515. Sharp waves seen in right frontotemporal region, maximal F8, No seizures noted. Will continue to monitor. Please review final report for details.   Ved Martos Barbra Sarks

## 2019-02-07 NOTE — H&P (Addendum)
NAME:  Judy Chang, MRN:  [id number], DOB:  March 27, 1950, LOS: 0 ADMISSION DATE:  02/07/2019, CONSULTATION DATE:  1/14 REFERRING MD:  Plunket, CHIEF COMPLAINT: PRES, seizure and hypertensive crisis   Brief History   69 year old female admitted with working diagnosis of PRES, hypertensive crisis and associated status epilepticus on 1/14  History of present illness   69 year old female who presented to the emergency room today on 1/14 via EMS unresponsive.  Apparently was in normal state of health on 1/13, however around 3 AM the morning of the 14th had headache and was noted to have elevated blood pressure of 200/100.  Her husband gave her her antihypertensive medication for this.  She became progressively less responsive and because of this EMS was summoned.  On EMS arrival she was lethargic, responding minimally but appropriate.  She apparently had a witnessed episode of full body seizure, described as "full body shaking", this continued in route to the emergency room.  Code stroke was called.  EEG was obtained in the emergency room showing focal status epilepticus involving the right temporoparietal region.  She required a total of 14 mg of lorazepam and loaded with Keppra.  Follow-up evaluation on continuous EEG was negative for seizure activity but continued to show sharp right frontal temporal waves.  MRI was obtained and felt consistent with PRES. she was started on Cleviprex infusion in addition to the above interventions, critical care asked to admit.  Past Medical History  Coronary artery disease, hypertension, hyperlipidemia.  Spinal stenosis requiring daily oxycodone.  Significant Hospital Events   19/58 69 year old female admitted with working diagnosis of PRES, hypertensive crisis and associated status epilepticus on 1/14. LTM started. Loaded w/ keppra. Received total of 14mg  lorazepam. Started on cleviprex gtt. PCCM asked to admit. ASA level: neg, APAP neg,   Consults:  Neuro    Procedures:    Significant Diagnostic Tests:  EEG 1/14 (initial): focal status epilepticus arising from right temporoparietal region.Additionally there is evidence of severe diffuse encephalopathy 1/14 after lorazepam and anticonvulsant load: Sharp waves seen in right frontotemporal region, maximal F8, No seizures noted 1/14 MRI brain: Abnormal cortical and subcortical edema in the occipital and posterior parietal brain bilaterally, with involvement also of the splenium of the corpus callosum. Patchy/speckled contrast enhancement. Think the findings are most consistent with posterior reversible encephalopathy in the subacute phase. See above discussion. 1/14 CT brain and neck: No acute finding.  Negative perfusion evaluation.No large or medium vessel occlusion.Atherosclerotic disease at both carotid bifurcations but without stenosis. Atherosclerotic disease in both carotid siphon regions but without narrowing greater than 30%. Micro Data:  resp panel influenza A,B and COVID PCR: neg SARS PCR 1/14: neg   Antimicrobials:    Interim history/subjective:  Lethargic   Objective   Blood pressure (Abnormal) 192/106, pulse 66, resp. rate 15, height 5\' 1"  (1.549 m), weight 54.4 kg, SpO2 100 %.        Intake/Output Summary (Last 24 hours) at 02/07/2019 1349 Last data filed at 02/07/2019 1016 Gross per 24 hour  Intake 150 ml  Output no documentation  Net 150 ml   Filed Weights   02/07/19 0918  Weight: 54.4 kg    Examination: General: 69 year old aaf currently sedated s/p multiple benzos  HENT: NCAT no JVD MMM Lungs: clear, decreased t/o has 10-15 sec periods of apnea interrupted by diaphragmatic spasm and then return of effort  Cardiovascular: RRR w/ soft systolic murmur  Abdomen: soft not tender  Extremities: warm and dry  Neuro: eyes closed. Localized to noxious stimulus. Moves all extremities.  GU: due to void   Resolved Hospital Problem list     Assessment & Plan:    Hypertensive Emergency  -has h/o HTN home meds include: Norvasc, HCTZ,& Lisinopril Plan Admit to ICU Cont cleveprex gtt for now w/ target goal of < 160 for now. Once she gets in ICU we can shoot for target SBP <140 Resume home meds after mental status will allow her to take PO intake.   Status epilepticus in setting of PRES and HTN emergency  Plan Seizure precautions ICU admit Serial neuro checks Cont LTM & keppra as directed by neurology Cont antihypertensives as outlined above Ativan for break thru seizures  Brief episodes of apnea. Acute and 2/2 recent benzo admin for seizures Plan Checking ABG  Acute metabolic and toxic encephalopathy.  She is currently post-ictal BUT also has received several doses of Benzos.  -going forward she also has heavy h/o ETOH abuse  Plan Admit to ICU Serial neuro checks Holding home sedating meds   Anion gap metabolic acidosis ->suspect 2/2 seizure and lactic acidosis  Plan Repeat chemistry and check lactate  CKD stage III Plan Renally adjust meds  Am chemistry  Fluid and electrolyte imbalance: hypokalemia, hypomagnesemia,  Plan Replace K and Mg   Polycythemia->has been chronic for last year. ? etiology Plan Monitor   Hyperglycemia Plan ssi   Best practice:  Diet: NPO Pain/Anxiety/Delirium protocol (if indicated): NA VAP protocol (if indicated): NA DVT prophylaxis: LMWH GI prophylaxis:PPI Glucose control: ssi Mobility: BR Code Status: full code  Family Communication: pending  Disposition: ICU   Labs   CBC: Recent Labs  Lab 02/07/19 0744  WBC 10.6*  NEUTROABS 7.3  HGB 21.6*  HCT 64.4*  MCV 87.5  PLT 778    Basic Metabolic Panel: Recent Labs  Lab 02/07/19 0744 02/07/19 0858  NA 137  --   K 3.4*  --   CL 102  --   CO2 19*  --   GLUCOSE 199*  --   BUN 12  --   CREATININE 1.34*  --   CALCIUM 10.4*  --   MG  --  1.5*  PHOS  --  3.3   GFR: Estimated Creatinine Clearance: 29.5 mL/min (A) (by C-G  formula based on SCr of 1.34 mg/dL (H)). Recent Labs  Lab 02/07/19 0744  WBC 10.6*    Liver Function Tests: Recent Labs  Lab 02/07/19 0744  AST 25  ALT 16  ALKPHOS 109  BILITOT 1.0  PROT 8.2*  ALBUMIN 3.5   No results for input(s): LIPASE, AMYLASE in the last 168 hours. No results for input(s): AMMONIA in the last 168 hours.  ABG No results found for: PHART, PCO2ART, PO2ART, HCO3, TCO2, ACIDBASEDEF, O2SAT   Coagulation Profile: No results for input(s): INR, PROTIME in the last 168 hours.  Cardiac Enzymes: No results for input(s): CKTOTAL, CKMB, CKMBINDEX, TROPONINI in the last 168 hours.  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   Surgical History    Past Surgical History:  Procedure Laterality Date  . COLONOSCOPY    . PARTIAL HYSTERECTOMY    . ROTATOR CUFF REPAIR Left   . TONSILLECTOMY    . TOTAL HIP ARTHROPLASTY Left 06/22/2018   Procedure: TOTAL HIP ARTHROPLASTY ANTERIOR APPROACH;  Surgeon: Dorna Leitz, MD;  Location: [facility];  Service: Orthopedics;  Laterality: Left;     Social History   reports that she has been smoking cigars. She has never used smokeless tobacco. She reports current alcohol use. She reports current drug use. Drug: Marijuana.   Family History   Her family history includes Hypertension in her father  and mother.   Allergies Allergies  Allergen Reactions  . Nsaids Rash     Home Medications  Prior to Admission medications   Medication Sig Start Date End Date Taking? Authorizing Provider  amLODipine (NORVASC) 5 MG tablet Take 5 mg by mouth daily. 11/21/14   [provider]  aspirin EC 325 MG tablet Take 1 tablet (325 mg total) by mouth 2 (two) times daily after a meal. Take x 1 month post op to decrease risk of blood clots. 06/22/18   Gary Fleet, PA-C  docusate sodium (COLACE) 100 MG capsule Take 1 capsule (100 mg total) by mouth 2 (two) times daily. 06/22/18   Gary Fleet, PA-C  FLUoxetine (PROZAC) 20 MG capsule Take 20 mg by mouth daily. 09/17/18   [provider]  gabapentin (NEURONTIN) 300 MG capsule Take 300 mg by mouth See admin instructions. Take one capsule by mouth one to three times daily. 12/10/18   [provider]  GARLIC PO Take 1 tablet by mouth 2 (two) times daily.    [provider]  hydrochlorothiazide (HYDRODIURIL) 25 MG tablet Take 25 mg by mouth 2 (two) times daily. 08/22/18   [provider]  lisinopril (ZESTRIL) 20 MG tablet Take 20 mg by mouth daily. 12/03/18   [provider]  lisinopril-hydrochlorothiazide (PRINZIDE,ZESTORETIC) 20-25 MG tablet Take 1 tablet by mouth daily. 11/17/14   [provider]  loratadine (CLARITIN) 10 MG tablet Take 10 mg by mouth daily as needed for allergies.    [provider]  Oxycodone HCl 20 MG TABS Take 1 tablet by mouth 4 (four) times daily.  10/29/14   [provider]  oxyCODONE-acetaminophen (PERCOCET/ROXICET) 5-325 MG tablet Take 1-2 tablets by mouth every 6 (six) hours as needed (for breakthrough pain.). 06/22/18   Gary Fleet, PA-C  pregabalin (LYRICA) 75 MG capsule Take 75 mg by mouth 2 (two) times daily.    [provider]  tiZANidine (ZANAFLEX) 4 MG tablet Take 4 mg by mouth 3 (three) times daily as needed for muscle spasms. 12/11/18    [provider]  traMADol (ULTRAM) 50 MG tablet Take 50-100 mg by mouth daily as needed for headache. 11/05/18   [provider]  traZODone (DESYREL) 50 MG tablet Take 50 mg by mouth at bedtime. 11/18/14   [provider]     Critical care time: 35 min     Erick Colace ACNP-BC [place] Pager # [phone] OR # [phone] if no answer

## 2019-02-07 NOTE — Procedures (Signed)
Patient Name: Judy Chang  MRN: [id number]  Epilepsy Attending: Lora Havens  Referring Physician/Provider: Dr. Kerney Elbe Date: 1/40/2021 Duration: 23.36 minutes  Patient history: 69 year old female presented with altered mental status and seizure.  EEG evaluate for status epilepticus.  Level of alertness: Comatose  AEDs during EEG study: Ativan, Keppra, fosphenytoin  Technical aspects: This EEG study was done with scalp electrodes positioned according to the 10-20 International system of electrode placement. Electrical activity was acquired at a sampling rate of 500Hz  and reviewed with a high frequency filter of 70Hz  and a low frequency filter of 1Hz . EEG data were recorded continuously and digitally stored.   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  Although it was difficult to visualize on camera, per neurology team patient had rhythmic jerking of left upper extremity and left gaze deviation.  EEG also showed continuous generalized 3-5 hz theta-delta slowing.  Hyperventilation and photic stimulation were not performed.  Abnormality -Focal status epilepticus, right temporoparietal region. -Continuous slow, generalized  IMPRESSION: This study showed evidence of focal status epilepticus arising from right temporoparietal region.  Additionally there is evidence of severe diffuse encephalopathy.   Dr. Cheral Marker was immediately notified.

## 2019-02-07 NOTE — Progress Notes (Signed)
[facility] Progress Note Patient Name: Judy Chang DOB: [birth year] MRN: [id number]   Date of Service  02/07/2019  HPI/Events of Note  Pt extremely agitated, not allowing placement of arterial line  eICU Interventions  Low level Precedex infusion ordered.        Kerry Kass Annette Bertelson 02/07/2019, 9:20 PM

## 2019-02-07 NOTE — ED Notes (Signed)
Patient transported to CT 

## 2019-02-07 NOTE — Progress Notes (Signed)
[facility] Progress Note Patient Name: Judy Chang DOB: [birth year] MRN: [id number]   Date of Service  02/07/2019  HPI/Events of Note  Pt is on Cleviprex for BP management, she is also a very difficult stick for lab draws.  eICU Interventions  Arterial line ordered for precise BP monitoring and blood drawing access.        Kerry Kass Bertine Schlottman 02/07/2019, 8:31 PM

## 2019-02-07 NOTE — Progress Notes (Signed)
Pt came back from MRI. Re-hooked EEG (new leads). Notified neuro

## 2019-02-07 NOTE — ED Notes (Addendum)
ED TO INPATIENT HANDOFF REPORT  ED Nurse Name and Phone #: Thurmond Butts [name] 69 y.o. female Room/Bed: 031C/031C  Code Status   Code Status: Full Code  Home/SNF/Other Home {Patient oriented to:22149:::1 Is this baseline? No   Triage Complete: Triage complete  Chief Complaint Status epilepticus ([place]) [G40.901]  Triage Note Pt BIB [facility] from home. Family reports pt fine when she went to bed last night at 2100. Pt woke this morning around 0400 complaining of headache. Pt home BP monitor read high. Upon EMS arrival pt responsive, answering questions. Pt became unresponsive on truck and began having seizure like activity. Pt received 2.5 of versed via EMS.     Allergies Allergies  Allergen Reactions  . Nsaids Rash    Level of Care/Admitting Diagnosis ED Disposition    ED Disposition Condition [facility] Area: [place] [[id number]]  Level of Care: ICU [6]  Covid Evaluation: Confirmed COVID Negative  Diagnosis: Status epilepticus [facility]) [[id number]]  Admitting Physician: Marshell Garfinkel [[id number]]  Attending Physician: Marshell Garfinkel [[id number]]  Estimated length of stay: past midnight tomorrow  Certification:: I certify this patient will need inpatient services for at least 2 midnights       B Medical/Surgery History Past Medical History:  Diagnosis Date  . 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 Anxiety   . Arthritis   . 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 Benign essential HTN 10/23/2015  . 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 DDD (degenerative disc disease), lumbar 01/10/2011   Noted on MRI  . Depression   . DJD (degenerative joint disease)   . GERD (gastroesophageal reflux disease)   . History of cataract    Bilateral  . Hypercholesterolemia   . Hypertension   . Labial tear 02/25/2018   Noted on MRI: smal Left anterior   . Lumbar spondylosis  01/10/2011   Noted on MRI  . Migraines   . Renal cyst 11/17/2015   Notedon [place] Abd: Simple, Right  . Spinal stenosis 09/06/2016   Noted on MRI: Moderate-Severe  . Vitreous floater, bilateral    Past Surgical History:  Procedure Laterality Date  . COLONOSCOPY    . PARTIAL HYSTERECTOMY    . ROTATOR CUFF REPAIR Left   . TONSILLECTOMY    . TOTAL HIP ARTHROPLASTY Left 06/22/2018   Procedure: TOTAL HIP ARTHROPLASTY ANTERIOR APPROACH;  Surgeon: Dorna Leitz, MD;  Location: [facility];  Service: Orthopedics;  Laterality: Left;     A IV Location/Drains/Wounds Patient Lines/Drains/Airways Status   Active Line/Drains/Airways    Name:   Placement date:   Placement time:   Site:   Days:   Peripheral IV 06/22/18 Right Forearm   06/22/18    0854    Forearm   230   Peripheral IV 02/07/19 Right Forearm   02/07/19    0911    Forearm   less than 1   Peripheral IV 02/07/19 Left Antecubital   02/07/19    0915    Antecubital   less than 1   Incision (Closed) 06/22/18 Hip Left   06/22/18    1128     230          Intake/Output Last 24 hours  Intake/Output Summary (Last 24 hours) at 02/07/2019 1743 Last data filed at 02/07/2019 1738 Gross per 24 hour  Intake 312.22 ml  Output --  Net 312.22 ml    Labs/Imaging Results for orders placed or performed during the hospital encounter of 02/07/19 (from the past 48 hour(s))  CBG monitoring, ED     Status: Abnormal   Collection Time: 02/07/19  7:41 AM  Result Value Ref Range   Glucose-Capillary 172 (H) 70 - 99 mg/dL   Comment 1 Notify RN    Comment 2 Document in Chart   CBC     Status: Abnormal   Collection Time: 02/07/19  7:44 AM  Result Value Ref Range   WBC 10.6 (H) 4.0 - 10.5 K/uL   RBC 7.36 (H) 3.87 - 5.11 MIL/uL   Hemoglobin 21.6 (HH) 12.0 - 15.0 g/dL    Comment: REPEATED TO VERIFY THIS CRITICAL RESULT HAS VERIFIED AND BEEN CALLED TO R Lazlo Tunney,RN BY KIM COLVIN ON 01 14 2021 AT 0757, AND HAS BEEN READ BACK.     HCT 64.4 (H) 36.0 - 46.0 %   MCV  87.5 80.0 - 100.0 fL   MCH 29.3 26.0 - 34.0 pg   MCHC 33.5 30.0 - 36.0 g/dL   RDW 19.7 (H) 11.5 - 15.5 %   Platelets 235 150 - 400 K/uL   nRBC 0.6 (H) 0.0 - 0.2 %    Comment: Performed at [facility] [address] [place], [place] [address]  Differential     Status: None   Collection Time: 02/07/19  7:44 AM  Result Value Ref Range   Neutrophils Relative % 69 %   Neutro Abs 7.3 1.7 - 7.7 K/uL   Lymphocytes Relative 24 %   Lymphs Abs 2.5 0.7 - 4.0 K/uL   Monocytes Relative 6 %   Monocytes Absolute 0.6 0.1 - 1.0 K/uL   Eosinophils Relative 0 %   Eosinophils Absolute 0.0 0.0 - 0.5 K/uL   Basophils Relative 1 %   Basophils Absolute 0.1 0.0 - 0.1 K/uL   Immature Granulocytes 0 %   Abs Immature Granulocytes 0.03 0.00 - 0.07 K/uL    Comment: Performed at [facility], [place] [address], [place], [place] [address]  Comprehensive metabolic panel     Status: Abnormal   Collection Time: 02/07/19  7:44 AM  Result Value Ref Range   Sodium 137 135 - 145 mmol/L   Potassium 3.4 (L) 3.5 - 5.1 mmol/L   Chloride 102 98 - 111 mmol/L   CO2 19 (L) 22 - 32 mmol/L   Glucose, Bld 199 (H) 70 - 99 mg/dL   BUN 12 8 - 23 mg/dL   Creatinine, Ser 1.34 (H) 0.44 - 1.00 mg/dL   Calcium 10.4 (H) 8.9 - 10.3 mg/dL   Total Protein 8.2 (H) 6.5 - 8.1 g/dL   Albumin 3.5 3.5 - 5.0 g/dL   AST 25 15 - 41 U/L   ALT 16 0 - 44 U/L   Alkaline Phosphatase 109 38 - 126 U/L   Total Bilirubin 1.0 0.3 - 1.2 mg/dL   GFR calc non Af Amer 40 (L) >60 mL/min   GFR calc Af Amer 46 (L) >60 mL/min   Anion gap 16 (H) 5 - 15    Comment: Performed at [facility], [place] [address], [place], [place] [address]  Respiratory Panel by RT PCR (Flu A&B, Covid) - Nasopharyngeal Swab     Status: None   Collection Time: 02/07/19  8:05 AM   Specimen: Nasopharyngeal Swab  Result Value Ref Range   SARS Coronavirus 2 by RT PCR NEGATIVE NEGATIVE  Comment: (NOTE) SARS-CoV-2 target nucleic acids are NOT DETECTED. The SARS-CoV-2  RNA is generally detectable in upper respiratoy specimens during the acute phase of infection. The lowest concentration of SARS-CoV-2 viral copies this assay can detect is 131 copies/mL. A negative result does not preclude SARS-Cov-2 infection and should not be used as the sole basis for treatment or other patient management decisions. A negative result may occur with  improper specimen collection/handling, submission of specimen other than nasopharyngeal swab, presence of viral mutation(s) within the areas targeted by this assay, and inadequate number of viral copies (<131 copies/mL). A negative result must be combined with clinical observations, patient history, and epidemiological information. The expected result is Negative. Fact Sheet for Patients:  PinkCheek.be Fact Sheet for Healthcare Providers:  GravelBags.it This test is not yet ap proved or cleared by the [place] FDA and  has been authorized for detection and/or diagnosis of SARS-CoV-2 by FDA under an Emergency Use Authorization (EUA). This EUA will remain  in effect (meaning this test can be used) for the duration of the COVID-19 declaration under Section 564(b)(1) of the Act, 21 U.S.C. section 360bbb-3(b)(1), unless the authorization is terminated or revoked sooner.    Influenza A by PCR NEGATIVE NEGATIVE   Influenza B by PCR NEGATIVE NEGATIVE    Comment: (NOTE) The Xpert Xpress SARS-CoV-2/FLU/RSV assay is intended as an aid in  the diagnosis of influenza from Nasopharyngeal swab specimens and  should not be used as a sole basis for treatment. Nasal washings and  aspirates are unacceptable for Xpert Xpress SARS-CoV-2/FLU/RSV  testing. Fact Sheet for Patients: PinkCheek.be Fact Sheet for Healthcare Providers: GravelBags.it This test is not yet approved or cleared by the [place] FDA and  has  been authorized for detection and/or diagnosis of SARS-CoV-2 by  FDA under an Emergency Use Authorization (EUA). This EUA will remain  in effect (meaning this test can be used) for the duration of the  Covid-19 declaration under Section 564(b)(1) of the Act, 21  U.S.C. section 360bbb-3(b)(1), unless the authorization is  terminated or revoked. Performed at [facility], [place] [address], [place], [place] [address]   POC SARS Coronavirus 2 Ag-ED - Nasal Swab (BD Veritor Kit)     Status: None   Collection Time: 02/07/19  8:26 AM  Result Value Ref Range   SARS Coronavirus 2 Ag NEGATIVE NEGATIVE    Comment: (NOTE) SARS-CoV-2 antigen NOT DETECTED.  Negative results are presumptive.  Negative results do not preclude SARS-CoV-2 infection and should not be used as the sole basis for treatment or other patient management decisions, including infection  control decisions, particularly in the presence of clinical signs and  symptoms consistent with COVID-19, or in those who have been in contact with the virus.  Negative results must be combined with clinical observations, patient history, and epidemiological information. The expected result is Negative. Fact Sheet for Patients: PodPark.tn Fact Sheet for Healthcare Providers: GiftContent.is This test is not yet approved or cleared by the [place] FDA and  has been authorized for detection and/or diagnosis of SARS-CoV-2 by FDA under an Emergency Use Authorization (EUA).  This EUA will remain in effect (meaning this test can be used) for the duration of  the COVID-19 de claration under Section 564(b)(1) of the Act, 21 U.S.C. section 360bbb-3(b)(1), unless the authorization is terminated or revoked sooner.   EtOH     Status: None   Collection Time: 02/07/19  8:36 AM  Result Value Ref Range  Alcohol, Ethyl (B) <10 <10 mg/dL    Comment: (NOTE) Lowest detectable limit for serum  alcohol is 10 mg/dL. For medical purposes only. Performed at [facility], [place] [address], [place], [place] [address]   Salicylate Level     Status: Abnormal   Collection Time: 02/07/19  8:36 AM  Result Value Ref Range   Salicylate Lvl <3.3 (L) 7.0 - 30.0 mg/dL    Comment: Performed at [facility] [address], [place], [place] [address]  Acetaminophen Level     Status: Abnormal   Collection Time: 02/07/19  8:36 AM  Result Value Ref Range   Acetaminophen (Tylenol), Serum <10 (L) 10 - 30 ug/mL    Comment: (NOTE) Therapeutic concentrations vary significantly. A range of 10-30 ug/mL  may be an effective concentration for many patients. However, some  are best treated at concentrations outside of this range. Acetaminophen concentrations >150 ug/mL at 4 hours after ingestion  and >50 ug/mL at 12 hours after ingestion are often associated with  toxic reactions. Performed at [facility], [place] [address], [place], [place] [address]   Phenytoin level, total     Status: Abnormal   Collection Time: 02/07/19  8:36 AM  Result Value Ref Range   Phenytoin Lvl <2.5 (L) 10.0 - 20.0 ug/mL    Comment: Performed at [facility] [address] [place], [place] [address]  Magnesium     Status: Abnormal   Collection Time: 02/07/19  8:58 AM  Result Value Ref Range   Magnesium 1.5 (L) 1.7 - 2.4 mg/dL    Comment: Performed at [facility] [address], [place],  [address]  Phosphorus     Status: None   Collection Time: 02/07/19  8:58 AM  Result Value Ref Range   Phosphorus 3.3 2.5 - 4.6 mg/dL    Comment: Performed at [facility] [address], [place], [place] [address]  I-STAT 7, (LYTES, BLD GAS, ICA, H+H)     Status: Abnormal   Collection Time: 02/07/19  3:53 PM  Result Value Ref Range   pH, Arterial 7.378 7.350 - 7.450   pCO2 arterial 39.4 32.0 - 48.0 mmHg   pO2, Arterial 104.0 83.0 - 108.0 mmHg   Bicarbonate 23.2 20.0 - 28.0 mmol/L   TCO2  24 22 - 32 mmol/L   O2 Saturation 98.0 %   Acid-base deficit 2.0 0.0 - 2.0 mmol/L   Sodium 137 135 - 145 mmol/L   Potassium 3.3 (L) 3.5 - 5.1 mmol/L   Calcium, Ion 1.28 1.15 - 1.40 mmol/L   HCT 63.0 (H) 36.0 - 46.0 %   Hemoglobin 21.4 (HH) 12.0 - 15.0 g/dL   Patient temperature HIDE    Sample type ARTERIAL    Comment NOTIFIED PHYSICIAN   CBG monitoring, ED     Status: Abnormal   Collection Time: 02/07/19  5:36 PM  Result Value Ref Range   Glucose-Capillary 141 (H) 70 - 99 mg/dL   Comment 1 Notify RN    Comment 2 Document in Chart    EEG  Result Date: 02/07/2019 Lora Havens, MD     02/07/2019 10:59 AM Patient Name: Judy Chang MRN: [id number] Epilepsy Attending: Lora Havens Referring Physician/Provider: Dr. Kerney Elbe Date: 1/40/2021 Duration: 23.36 minutes Patient history: 69 year old female presented with altered mental status and seizure.  EEG evaluate for status epilepticus. Level of alertness: Comatose AEDs during EEG study: Ativan, Keppra, fosphenytoin  Technical aspects: This EEG study was done with scalp electrodes positioned according to the 10-20 International system of electrode placement. Electrical activity was acquired at a sampling rate of '500Hz'$  and reviewed with a high frequency filter of '70Hz'$  and a low frequency filter of '1Hz'$ . EEG data were recorded continuously and digitally stored. 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  Although it was difficult to visualize on camera, per neurology team patient had rhythmic jerking of left upper extremity and left gaze deviation.  EEG also showed continuous generalized 3-5 hz theta-delta slowing.  Hyperventilation and photic stimulation were not performed. Abnormality -Focal status epilepticus, right temporoparietal region. 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  Additionally  there is evidence of severe diffuse encephalopathy. Dr. Cheral Marker was immediately notified.   CT ANGIO HEAD W OR WO CONTRAST  Result Date: 02/07/2019 CLINICAL DATA:  Focal neurological deficit. Severe headache and possible seizures. EXAM: CT ANGIOGRAPHY HEAD AND NECK CT PERFUSION BRAIN TECHNIQUE: Multidetector CT imaging of the head and neck was performed using the standard protocol during bolus administration of intravenous contrast. Multiplanar CT image reconstructions and MIPs were obtained to evaluate the vascular anatomy. Carotid stenosis measurements (when applicable) are obtained utilizing NASCET criteria, using the distal internal carotid diameter as the denominator. Multiphase CT imaging of the brain was performed following IV bolus contrast injection. Subsequent parametric perfusion maps were calculated using RAPID software. CONTRAST:  136m OMNIPAQUE IOHEXOL 350 MG/ML SOLN COMPARISON:  Head CT earlier same day FINDINGS: CTA NECK FINDINGS Aortic arch: Mild aortic atherosclerosis. No aneurysm or dissection. Branching pattern is normal without origin stenosis. Right carotid system: Common carotid artery widely patent to the bifurcation. Soft and calcified plaque of the ICA bulb but no stenosis compared to the more distal cervical ICA. Left carotid system: Common carotid artery widely patent to the bifurcation. Soft and calcified plaque at the bifurcation and ICA bulb. Minimal diameter of the ICA bulb is 3.5 mm. Compared to a more distal cervical ICA diameter of the same, there is no stenosis. Vertebral arteries: Both vertebral artery origins are widely patent without stenosis. Both vertebral arteries appear normal through the cervical region. Skeleton: Ordinary cervical spondylosis. Other neck: Thyroid goiter without dominant nodule. Upper chest: Normal Review of the MIP images confirms the above findings CTA HEAD FINDINGS Anterior circulation: Both internal carotid arteries are patent through the skull  base and siphon regions. There is ordinary atherosclerotic calcification affecting the carotid siphons but without stenosis greater than 30%. The anterior and middle cerebral vessels are patent without large or medium vessel occlusion, proximal stenosis, aneurysm or vascular malformation. Posterior circulation: Both vertebral arteries are patent through the foramen magnum to the basilar. No basilar stenosis. Posterior circulation branch vessels are patent. Venous sinuses: Patent and normal. Anatomic variants: None significant. Review of the MIP images confirms the above findings CT Brain Perfusion Findings: ASPECTS: 10 CBF (<30%) Volume: 061mPerfusion (Tmax>6.0s) volume: 15m9mismatch Volume: 15mL22mfarction Location:None IMPRESSION: No acute finding.  Negative perfusion evaluation. No large or medium vessel occlusion. Atherosclerotic disease at both carotid bifurcations but without stenosis. Atherosclerotic disease in both carotid siphon regions but without narrowing greater than 30%. These results were communicated to Dr. LindCheral Marker9:16 amon 1/14/2021by text page via the AMIOC[facility] LLCsaging system. Electronically Signed   By: MarkNelson Chimes.   On: 02/07/2019 09:18   CT ANGIO NECK W OR WO CONTRAST  Result Date:  02/07/2019 CLINICAL DATA:  Focal neurological deficit. Severe headache and possible seizures. EXAM: CT ANGIOGRAPHY HEAD AND NECK CT PERFUSION BRAIN TECHNIQUE: Multidetector CT imaging of the head and neck was performed using the standard protocol during bolus administration of intravenous contrast. Multiplanar CT image reconstructions and MIPs were obtained to evaluate the vascular anatomy. Carotid stenosis measurements (when applicable) are obtained utilizing NASCET criteria, using the distal internal carotid diameter as the denominator. Multiphase CT imaging of the brain was performed following IV bolus contrast injection. Subsequent parametric perfusion maps were calculated using RAPID software.  CONTRAST:  125m OMNIPAQUE IOHEXOL 350 MG/ML SOLN COMPARISON:  Head CT earlier same day FINDINGS: CTA NECK FINDINGS Aortic arch: Mild aortic atherosclerosis. No aneurysm or dissection. Branching pattern is normal without origin stenosis. Right carotid system: Common carotid artery widely patent to the bifurcation. Soft and calcified plaque of the ICA bulb but no stenosis compared to the more distal cervical ICA. Left carotid system: Common carotid artery widely patent to the bifurcation. Soft and calcified plaque at the bifurcation and ICA bulb. Minimal diameter of the ICA bulb is 3.5 mm. Compared to a more distal cervical ICA diameter of the same, there is no stenosis. Vertebral arteries: Both vertebral artery origins are widely patent without stenosis. Both vertebral arteries appear normal through the cervical region. Skeleton: Ordinary cervical spondylosis. Other neck: Thyroid goiter without dominant nodule. Upper chest: Normal Review of the MIP images confirms the above findings CTA HEAD FINDINGS Anterior circulation: Both internal carotid arteries are patent through the skull base and siphon regions. There is ordinary atherosclerotic calcification affecting the carotid siphons but without stenosis greater than 30%. The anterior and middle cerebral vessels are patent without large or medium vessel occlusion, proximal stenosis, aneurysm or vascular malformation. Posterior circulation: Both vertebral arteries are patent through the foramen magnum to the basilar. No basilar stenosis. Posterior circulation branch vessels are patent. Venous sinuses: Patent and normal. Anatomic variants: None significant. Review of the MIP images confirms the above findings CT Brain Perfusion Findings: ASPECTS: 10 CBF (<30%) Volume: 063mPerfusion (Tmax>6.0s) volume: 45m28mismatch Volume: 45mL15mfarction Location:None IMPRESSION: No acute finding.  Negative perfusion evaluation. No large or medium vessel occlusion. Atherosclerotic  disease at both carotid bifurcations but without stenosis. Atherosclerotic disease in both carotid siphon regions but without narrowing greater than 30%. These results were communicated to Dr. LindCheral Marker9:16 amon 1/14/2021by text page via the AMIOS[facility] [employer]saging system. Electronically Signed   By: MarkNelson Chimes.   On: 02/07/2019 09:18   MR BRAIN W WO CONTRAST  Result Date: 02/07/2019 CLINICAL DATA:  Severe headache. Possible seizure. Abnormal neurological exam. EXAM: MRI HEAD WITHOUT AND WITH CONTRAST TECHNIQUE: Multiplanar, multiecho pulse sequences of the brain and surrounding structures were obtained without and with intravenous contrast. CONTRAST:  5mL 22mAVIST GADOBUTROL 1 MMOL/ML IV SOLN COMPARISON:  CT studies earlier same day FINDINGS: Brain: Diffusion imaging does not show any acute or subacute infarction. Minimal small vessel ischemic change of the pons. No cerebellar abnormality. Cerebral hemispheres show abnormal cortical and subcortical edema in the occipital and posterior parietal regions, with some involvement of the splenium of the corpus callosum. Findings likely to indicate posterior reversible encephalopathy. Patchy abnormal contrast enhancement in that region. Findings are most consistent with a subacute phase of posterior reversible encephalopathy. I did consider other diagnoses including vascular lymphoma or atypical infections with an unusual distribution but think those are un likely. No hemorrhage, hydrocephalus or extra-axial fluid collection. No evidence of pre-existing significant  small-vessel disease. Vascular: Major vessels at the base of the brain show flow. Skull and upper cervical spine: Negative Sinuses/Orbits: Clear/normal Other: None IMPRESSION: Abnormal cortical and subcortical edema in the occipital and posterior parietal brain bilaterally, with involvement also of the splenium of the corpus callosum. Patchy/speckled contrast enhancement. Think the findings are most  consistent with posterior reversible encephalopathy in the subacute phase. See above discussion. Electronically Signed   By: Nelson Chimes M.D.   On: 02/07/2019 11:25   CT CEREBRAL PERFUSION W CONTRAST  Result Date: 02/07/2019 CLINICAL DATA:  Focal neurological deficit. Severe headache and possible seizures. EXAM: CT ANGIOGRAPHY HEAD AND NECK CT PERFUSION BRAIN TECHNIQUE: Multidetector CT imaging of the head and neck was performed using the standard protocol during bolus administration of intravenous contrast. Multiplanar CT image reconstructions and MIPs were obtained to evaluate the vascular anatomy. Carotid stenosis measurements (when applicable) are obtained utilizing NASCET criteria, using the distal internal carotid diameter as the denominator. Multiphase CT imaging of the brain was performed following IV bolus contrast injection. Subsequent parametric perfusion maps were calculated using RAPID software. CONTRAST:  115m OMNIPAQUE IOHEXOL 350 MG/ML SOLN COMPARISON:  Head CT earlier same day FINDINGS: CTA NECK FINDINGS Aortic arch: Mild aortic atherosclerosis. No aneurysm or dissection. Branching pattern is normal without origin stenosis. Right carotid system: Common carotid artery widely patent to the bifurcation. Soft and calcified plaque of the ICA bulb but no stenosis compared to the more distal cervical ICA. Left carotid system: Common carotid artery widely patent to the bifurcation. Soft and calcified plaque at the bifurcation and ICA bulb. Minimal diameter of the ICA bulb is 3.5 mm. Compared to a more distal cervical ICA diameter of the same, there is no stenosis. Vertebral arteries: Both vertebral artery origins are widely patent without stenosis. Both vertebral arteries appear normal through the cervical region. Skeleton: Ordinary cervical spondylosis. Other neck: Thyroid goiter without dominant nodule. Upper chest: Normal Review of the MIP images confirms the above findings CTA HEAD FINDINGS  Anterior circulation: Both internal carotid arteries are patent through the skull base and siphon regions. There is ordinary atherosclerotic calcification affecting the carotid siphons but without stenosis greater than 30%. The anterior and middle cerebral vessels are patent without large or medium vessel occlusion, proximal stenosis, aneurysm or vascular malformation. Posterior circulation: Both vertebral arteries are patent through the foramen magnum to the basilar. No basilar stenosis. Posterior circulation branch vessels are patent. Venous sinuses: Patent and normal. Anatomic variants: None significant. Review of the MIP images confirms the above findings CT Brain Perfusion Findings: ASPECTS: 10 CBF (<30%) Volume: 064mPerfusion (Tmax>6.0s) volume: 32m45mismatch Volume: 32mL34mfarction Location:None IMPRESSION: No acute finding.  Negative perfusion evaluation. No large or medium vessel occlusion. Atherosclerotic disease at both carotid bifurcations but without stenosis. Atherosclerotic disease in both carotid siphon regions but without narrowing greater than 30%. These results were communicated to Dr. LindCheral Marker9:16 amon 1/14/2021by text page via the [facility] system. Electronically Signed   By: MarkNelson Chimes.   On: 02/07/2019 09:18   CT HEAD CODE STROKE WO CONTRAST  Result Date: 02/07/2019 CLINICAL DATA:  Code stroke.  Severe headache with seizures. EXAM: CT HEAD WITHOUT CONTRAST TECHNIQUE: Contiguous axial images were obtained from the base of the skull through the vertex without intravenous contrast. COMPARISON:  None. FINDINGS: Brain: No evidence of acute infarction, hemorrhage, hydrocephalus, extra-axial collection or mass lesion/mass effect. Vascular: No convincing hyperdense vessel when accounting for streak artifact. Skull: Negative for fracture or  bone lesion. Sinuses/Orbits: Negative Other: These results were communicated to Dr. Cheral Marker at 8:36 amon 1/14/2021by text page via the [facility]  messaging system. ASPECTS [facility] Stroke Program Early CT Score) Not scored with this history IMPRESSION: No acute finding. Electronically Signed   By: Monte Fantasia M.D.   On: 02/07/2019 08:37    Pending Labs Unresulted Labs (From admission, onward)    Start     Ordered   02/08/19 0500  Ammonia  Tomorrow morning,   R     02/07/19 0955   02/08/19 0500  CBC  Tomorrow morning,   R     02/07/19 1455   02/08/19 4944  Basic metabolic panel  Tomorrow morning,   R     02/07/19 1455   02/08/19 0500  Magnesium  Tomorrow morning,   R     02/07/19 1455   02/08/19 0500  Phosphorus  Tomorrow morning,   R     02/07/19 1455   02/07/19 1558  Phenytoin level, total  Once,   STAT     02/07/19 1557   02/07/19 1459  Hemoglobin A1c  Once,   STAT    Comments: To assess prior glycemic control    02/07/19 1458   02/07/19 1455  HIV Antibody (routine testing w rflx)  (HIV Antibody (Routine testing w reflex) panel)  Once,   STAT     02/07/19 1455   02/07/19 1455  CBC  (heparin)  Once,   STAT    Comments: Baseline for heparin therapy IF NOT ALREADY DRAWN.  Notify MD if PLT < 100 K.    02/07/19 1455   02/07/19 1455  Comprehensive metabolic panel  Once,   STAT     02/07/19 1455   02/07/19 1455  Lactic acid, plasma  STAT Now then every 3 hours,   R (with STAT occurrences)     02/07/19 1455   02/07/19 1455  Urine rapid drug screen (hosp performed)  ONCE - STAT,   STAT     02/07/19 1455   02/07/19 1455  Blood gas, arterial  Once,   R     02/07/19 1455   02/07/19 1427  Blood gas, arterial  Once,   STAT     02/07/19 1426          Vitals/Pain Today's Vitals   02/07/19 1340 02/07/19 1350 02/07/19 1630 02/07/19 1730  BP: (!) 192/106 (!) 195/107 (!) 159/85 (!) 147/85  Pulse:   82 78  Resp: 15 (!) _0 SpO2:   100% 100%  Weight:      Height:        Isolation Precautions Airborne and Contact precautions  Medications Medications  levETIRAcetam (KEPPRA) IVPB 500 mg/100 mL premix (has no  administration in time range)  phenytoin (DILANTIN) injection 100 mg (100 mg Intravenous Given 02/07/19 1725)  clevidipine (CLEVIPREX) infusion 0.5 mg/mL (2 mg/hr Intravenous Rate/Dose Verify 02/07/19 1606)  heparin injection 5,000 Units (5,000 Units Subcutaneous Given 02/07/19 1730)  lactated ringers infusion ( Intravenous New Bag/Given 02/07/19 1604)  potassium chloride 10 mEq in 100 mL IVPB (10 mEq Intravenous New Bag/Given 02/07/19 1739)  insulin aspart (novoLOG) injection 0-6 Units (0 Units Subcutaneous Not Given 02/07/19 1739)  LORazepam (ATIVAN) injection 1 mg (has no administration in time range)  sodium chloride flush (NS) 0.9 % injection 3 mL (3 mLs Intravenous Given 02/07/19 0917)  LORazepam (ATIVAN) 2 MG/ML injection (2 mg  Given 02/07/19 0912)  levETIRAcetam (KEPPRA) IVPB 1000 mg/100 mL premix (  0 mg Intravenous Stopped 02/07/19 0914)  LORazepam (ATIVAN) 2 MG/ML injection (2 mg  Given 02/07/19 0912)  LORazepam (ATIVAN) 2 MG/ML injection (2 mg  Given 02/07/19 0912)  LORazepam (ATIVAN) 2 MG/ML injection (2 mg  Given 02/07/19 0912)  LORazepam (ATIVAN) 2 MG/ML injection (2 mg  Given 02/07/19 0913)  LORazepam (ATIVAN) 2 MG/ML injection (2 mg  Given 02/07/19 0913)  fosPHENYtoin (CEREBYX) 1,000 mg PE in sodium chloride 0.9 % 50 mL IVPB (0 mg PE Intravenous Stopped 02/07/19 1016)  LORazepam (ATIVAN) 2 MG/ML injection (2 mg  Given 02/07/19 0913)  iohexol (OMNIPAQUE) 350 MG/ML injection 100 mL (100 mLs Intravenous Contrast Given 02/07/19 0904)  labetalol (NORMODYNE) injection 10 mg (10 mg Intravenous Given 02/07/19 0950)  gadobutrol (GADAVIST) 1 MMOL/ML injection 5 mL (5 mLs Intravenous Contrast Given 02/07/19 1114)  magnesium sulfate IVPB 2 g 50 mL (0 g Intravenous Stopped 02/07/19 1707)    Mobility walks High fall risk   Focused Assessments    R Recommendations: See Admitting Provider Note  Report given to: 2H RN  Additional Notes:

## 2019-02-07 NOTE — ED Notes (Signed)
Per primary RN family update via neurologist more appropriate communication

## 2019-02-07 NOTE — Plan of Care (Signed)
LTM eg reviewed till 1300. Sharp waves seen in right frontotemporal region, maximal F8, No seizures noted. Will continue to monitor. Please review final report for details.  Aqueelah Cotrell Barbra Sarks

## 2019-02-08 DIAGNOSIS — I6783 Posterior reversible encephalopathy syndrome: Principal | ICD-10-CM

## 2019-02-08 DIAGNOSIS — I161 Hypertensive emergency: Secondary | ICD-10-CM

## 2019-02-08 LAB — RAPID URINE DRUG SCREEN, HOSP PERFORMED
Amphetamines: NOT DETECTED
Barbiturates: NOT DETECTED
Benzodiazepines: POSITIVE — AB
Cocaine: POSITIVE — AB
Opiates: NOT DETECTED
Tetrahydrocannabinol: POSITIVE — AB

## 2019-02-08 LAB — BASIC METABOLIC PANEL
Anion gap: 9 (ref 5–15)
BUN: 18 mg/dL (ref 8–23)
CO2: 21 mmol/L — ABNORMAL LOW (ref 22–32)
Calcium: 8.6 mg/dL — ABNORMAL LOW (ref 8.9–10.3)
Chloride: 105 mmol/L (ref 98–111)
Creatinine, Ser: 1.61 mg/dL — ABNORMAL HIGH (ref 0.44–1.00)
GFR calc Af Amer: 37 mL/min — ABNORMAL LOW (ref >60)
GFR calc non Af Amer: 32 mL/min — ABNORMAL LOW (ref >60)
Glucose, Bld: 132 mg/dL — ABNORMAL HIGH (ref 70–99)
Potassium: 3.8 mmol/L (ref 3.5–5.1)
Sodium: 135 mmol/L (ref 135–145)

## 2019-02-08 LAB — LACTIC ACID, PLASMA: Lactic Acid, Venous: 1.3 mmol/L (ref 0.5–1.9)

## 2019-02-08 LAB — PHENYTOIN LEVEL, TOTAL: Phenytoin Lvl: 18.5 ug/mL (ref 10.0–20.0)

## 2019-02-08 LAB — GLUCOSE, CAPILLARY
Glucose-Capillary: 116 mg/dL — ABNORMAL HIGH (ref 70–99)
Glucose-Capillary: 80 mg/dL (ref 70–99)
Glucose-Capillary: 85 mg/dL (ref 70–99)
Glucose-Capillary: 90 mg/dL (ref 70–99)
Glucose-Capillary: 94 mg/dL (ref 70–99)
Glucose-Capillary: 96 mg/dL (ref 70–99)

## 2019-02-08 LAB — CBC
HCT: 52.3 % — ABNORMAL HIGH (ref 36.0–46.0)
Hemoglobin: 18 g/dL — ABNORMAL HIGH (ref 12.0–15.0)
MCH: 29.1 pg (ref 26.0–34.0)
MCHC: 34.4 g/dL (ref 30.0–36.0)
MCV: 84.5 fL (ref 80.0–100.0)
Platelets: 224 10*3/uL (ref 150–400)
RBC: 6.19 MIL/uL — ABNORMAL HIGH (ref 3.87–5.11)
RDW: 18.7 % — ABNORMAL HIGH (ref 11.5–15.5)
WBC: 16.2 10*3/uL — ABNORMAL HIGH (ref 4.0–10.5)
nRBC: 0 % (ref 0.0–0.2)

## 2019-02-08 LAB — PHOSPHORUS: Phosphorus: 4 mg/dL (ref 2.5–4.6)

## 2019-02-08 LAB — HEMOGLOBIN A1C
Hgb A1c MFr Bld: 5.2 % (ref 4.8–5.6)
Mean Plasma Glucose: 102.54 mg/dL

## 2019-02-08 LAB — HIV ANTIBODY (ROUTINE TESTING W REFLEX): HIV Screen 4th Generation wRfx: NONREACTIVE

## 2019-02-08 LAB — MAGNESIUM: Magnesium: 2.1 mg/dL (ref 1.7–2.4)

## 2019-02-08 LAB — AMMONIA: Ammonia: <9 umol/L — ABNORMAL LOW (ref 9–35)

## 2019-02-08 MED ORDER — DEXTROSE-NACL 5-0.9 % IV SOLN: INTRAVENOUS | Status: DC

## 2019-02-08 NOTE — Progress Notes (Signed)
Subjective: No complaints.   Objective: Current vital signs: BP 128/89   Pulse (!) 59   Temp (!) 97.3 F (36.3 C) (Axillary)   Resp (!) 30   Ht '5\' 1"'  (1.549 m)   Wt 50.5 kg   SpO2 (!) 88%   BMI 21.04 kg/m  Vital signs in last 24 hours: Temp:  [97.3 F (36.3 C)-98.7 F (37.1 C)] 97.3 F (36.3 C) (01/15 0800) Pulse Rate:  [51-96] 59 (01/15 0900) Resp:  [14-38] 30 (01/15 0900) BP: (107-195)/(52-113) 128/89 (01/15 0900) SpO2:  [85 %-100 %] 88 % (01/15 0900) Arterial Line BP: (112-152)/(49-83) 152/68 (01/15 0900) Weight:  [50.5 kg] 50.5 kg (01/15 0456)  Intake/Output from previous day: 01/14 0701 - 01/15 0700 In: 890.8 [I.V.:172.4; IV Piggyback:718.4] Out: 0  Intake/Output this shift: Total I/O In: 91.6 [I.V.:7.8; IV Piggyback:83.8] Out: 150 [Urine:150] Nutritional status:  Diet Order            Diet NPO time specified  Diet effective now             HEENT: [place]/AT. EEG leads in place.  Lungs: Respirations unlabored Ext: No edema  Neurologic Exam: Ment: Drowsy. Awakens to light noxious stimuli and loud vocalization. Oriented to city and self only. Still not oriented to situation. Speech is sparse and in this context unable to assess fluency. Follows some simple commands.   CN: PERRL. Briefly fixates on examiner's face. EOMI. Face symmetric.  Motor: Resists with 4+/5 strength bilateral upper extremities. Withdraws with 4+/5 strength BLE. No asymmetry.  Sensory: Reacts to plantar stimulation bilaterally.  Reflexes: 4+ patellae and achilles bilaterally (crossed adductor responses).   Lab Results: Results for orders placed or performed during the hospital encounter of 02/07/19 (from the past 48 hour(s))  CBG monitoring, ED     Status: Abnormal   Collection Time: 02/07/19  7:41 AM  Result Value Ref Range   Glucose-Capillary 172 (H) 70 - 99 mg/dL   Comment 1 Notify RN    Comment 2 Document in Chart   CBC     Status: Abnormal   Collection Time: 02/07/19  7:44 AM   Result Value Ref Range   WBC 10.6 (H) 4.0 - 10.5 K/uL   RBC 7.36 (H) 3.87 - 5.11 MIL/uL   Hemoglobin 21.6 (HH) 12.0 - 15.0 g/dL    Comment: REPEATED TO VERIFY THIS CRITICAL RESULT HAS VERIFIED AND BEEN CALLED TO R HARDY,RN BY KIM COLVIN ON 01 14 2021 AT 0757, AND HAS BEEN READ BACK.     HCT 64.4 (H) 36.0 - 46.0 %   MCV 87.5 80.0 - 100.0 fL   MCH 29.3 26.0 - 34.0 pg   MCHC 33.5 30.0 - 36.0 g/dL   RDW 19.7 (H) 11.5 - 15.5 %   Platelets 235 150 - 400 K/uL   nRBC 0.6 (H) 0.0 - 0.2 %    Comment: Performed at [facility] [address], [place], [place] [address]  Differential     Status: None   Collection Time: 02/07/19  7:44 AM  Result Value Ref Range   Neutrophils Relative % 69 %   Neutro Abs 7.3 1.7 - 7.7 K/uL   Lymphocytes Relative 24 %   Lymphs Abs 2.5 0.7 - 4.0 K/uL   Monocytes Relative 6 %   Monocytes Absolute 0.6 0.1 - 1.0 K/uL   Eosinophils Relative 0 %   Eosinophils Absolute 0.0 0.0 - 0.5 K/uL   Basophils Relative 1 %   Basophils Absolute 0.1  0.0 - 0.1 K/uL   Immature Granulocytes 0 %   Abs Immature Granulocytes 0.03 0.00 - 0.07 K/uL    Comment: Performed at [facility], [place] [address] [place], [place] [address]  Comprehensive metabolic panel     Status: Abnormal   Collection Time: 02/07/19  7:44 AM  Result Value Ref Range   Sodium 137 135 - 145 mmol/L   Potassium 3.4 (L) 3.5 - 5.1 mmol/L   Chloride 102 98 - 111 mmol/L   CO2 19 (L) 22 - 32 mmol/L   Glucose, Bld 199 (H) 70 - 99 mg/dL   BUN 12 8 - 23 mg/dL   Creatinine, Ser 1.34 (H) 0.44 - 1.00 mg/dL   Calcium 10.4 (H) 8.9 - 10.3 mg/dL   Total Protein 8.2 (H) 6.5 - 8.1 g/dL   Albumin 3.5 3.5 - 5.0 g/dL   AST 25 15 - 41 U/L   ALT 16 0 - 44 U/L   Alkaline Phosphatase 109 38 - 126 U/L   Total Bilirubin 1.0 0.3 - 1.2 mg/dL   GFR calc non Af Amer 40 (L) >60 mL/min   GFR calc Af Amer 46 (L) >60 mL/min   Anion gap 16 (H) 5 - 15    Comment: Performed at [facility], [place] [address],  [place], [place] [address]  Respiratory Panel by RT PCR (Flu A&B, Covid) - Nasopharyngeal Swab     Status: None   Collection Time: 02/07/19  8:05 AM   Specimen: Nasopharyngeal Swab  Result Value Ref Range   SARS Coronavirus 2 by RT PCR NEGATIVE NEGATIVE    Comment: (NOTE) SARS-CoV-2 target nucleic acids are NOT DETECTED. The SARS-CoV-2 RNA is generally detectable in upper respiratoy specimens during the acute phase of infection. The lowest concentration of SARS-CoV-2 viral copies this assay can detect is 131 copies/mL. A negative result does not preclude SARS-Cov-2 infection and should not be used as the sole basis for treatment or other patient management decisions. A negative result may occur with  improper specimen collection/handling, submission of specimen other than nasopharyngeal swab, presence of viral mutation(s) within the areas targeted by this assay, and inadequate number of viral copies (<131 copies/mL). A negative result must be combined with clinical observations, patient history, and epidemiological information. The expected result is Negative. Fact Sheet for Patients:  PinkCheek.be Fact Sheet for Healthcare Providers:  GravelBags.it This test is not yet ap proved or cleared by the [place] FDA and  has been authorized for detection and/or diagnosis of SARS-CoV-2 by FDA under an Emergency Use Authorization (EUA). This EUA will remain  in effect (meaning this test can be used) for the duration of the COVID-19 declaration under Section 564(b)(1) of the Act, 21 U.S.C. section 360bbb-3(b)(1), unless the authorization is terminated or revoked sooner.    Influenza A by PCR NEGATIVE NEGATIVE   Influenza B by PCR NEGATIVE NEGATIVE    Comment: (NOTE) The Xpert Xpress SARS-CoV-2/FLU/RSV assay is intended as an aid in  the diagnosis of influenza from Nasopharyngeal swab specimens and  should not be used as a sole basis  for treatment. Nasal washings and  aspirates are unacceptable for Xpert Xpress SARS-CoV-2/FLU/RSV  testing. Fact Sheet for Patients: PinkCheek.be Fact Sheet for Healthcare Providers: GravelBags.it This test is not yet approved or cleared by the [place] FDA and  has been authorized for detection and/or diagnosis of SARS-CoV-2 by  FDA under an Emergency Use Authorization (EUA). This EUA will remain  in  effect (meaning this test can be used) for the duration of the  Covid-19 declaration under Section 564(b)(1) of the Act, 21  U.S.C. section 360bbb-3(b)(1), unless the authorization is  terminated or revoked. Performed at [facility], [place] [address], [place], [place] [address]   POC SARS Coronavirus 2 Ag-ED - Nasal Swab (BD Veritor Kit)     Status: None   Collection Time: 02/07/19  8:26 AM  Result Value Ref Range   SARS Coronavirus 2 Ag NEGATIVE NEGATIVE    Comment: (NOTE) SARS-CoV-2 antigen NOT DETECTED.  Negative results are presumptive.  Negative results do not preclude SARS-CoV-2 infection and should not be used as the sole basis for treatment or other patient management decisions, including infection  control decisions, particularly in the presence of clinical signs and  symptoms consistent with COVID-19, or in those who have been in contact with the virus.  Negative results must be combined with clinical observations, patient history, and epidemiological information. The expected result is Negative. Fact Sheet for Patients: PodPark.tn Fact Sheet for Healthcare Providers: GiftContent.is This test is not yet approved or cleared by the [place] FDA and  has been authorized for detection and/or diagnosis of SARS-CoV-2 by FDA under an Emergency Use Authorization (EUA).  This EUA will remain in effect (meaning this test can be used) for the duration of   the COVID-19 de claration under Section 564(b)(1) of the Act, 21 U.S.C. section 360bbb-3(b)(1), unless the authorization is terminated or revoked sooner.   EtOH     Status: None   Collection Time: 02/07/19  8:36 AM  Result Value Ref Range   Alcohol, Ethyl (B) <10 <10 mg/dL    Comment: (NOTE) Lowest detectable limit for serum alcohol is 10 mg/dL. For medical purposes only. Performed at [facility], [place] [address] [place], [place] [address]   Salicylate Level     Status: Abnormal   Collection Time: 02/07/19  8:36 AM  Result Value Ref Range   Salicylate Lvl <1.4 (L) 7.0 - 30.0 mg/dL    Comment: Performed at [facility] [address], [place], [place] [address]  Acetaminophen Level     Status: Abnormal   Collection Time: 02/07/19  8:36 AM  Result Value Ref Range   Acetaminophen (Tylenol), Serum <10 (L) 10 - 30 ug/mL    Comment: (NOTE) Therapeutic concentrations vary significantly. A range of 10-30 ug/mL  may be an effective concentration for many patients. However, some  are best treated at concentrations outside of this range. Acetaminophen concentrations >150 ug/mL at 4 hours after ingestion  and >50 ug/mL at 12 hours after ingestion are often associated with  toxic reactions. Performed at [facility], [place] [address], [place], [place] [address]   Phenytoin level, total     Status: Abnormal   Collection Time: 02/07/19  8:36 AM  Result Value Ref Range   Phenytoin Lvl <2.5 (L) 10.0 - 20.0 ug/mL    Comment: Performed at [facility] [address] [place], [place] [address]  Magnesium     Status: Abnormal   Collection Time: 02/07/19  8:58 AM  Result Value Ref Range   Magnesium 1.5 (L) 1.7 - 2.4 mg/dL    Comment: Performed at [facility] [address] [place],  [address]  Phosphorus     Status: None   Collection Time: 02/07/19  8:58 AM  Result Value Ref Range   Phosphorus 3.3 2.5 - 4.6  mg/dL    Comment: Performed at [facility], [place] [address] [place], [place] [address]  I-STAT 7, (LYTES, BLD GAS, ICA, H+H)     Status: Abnormal   Collection Time: 02/07/19  3:53 PM  Result Value Ref Range   pH, Arterial 7.378 7.350 - 7.450   pCO2 arterial 39.4 32.0 - 48.0 mmHg   pO2, Arterial 104.0 83.0 - 108.0 mmHg   Bicarbonate 23.2 20.0 - 28.0 mmol/L   TCO2 24 22 - 32 mmol/L   O2 Saturation 98.0 %   Acid-base deficit 2.0 0.0 - 2.0 mmol/L   Sodium 137 135 - 145 mmol/L   Potassium 3.3 (L) 3.5 - 5.1 mmol/L   Calcium, Ion 1.28 1.15 - 1.40 mmol/L   HCT 63.0 (H) 36.0 - 46.0 %   Hemoglobin 21.4 (HH) 12.0 - 15.0 g/dL   Patient temperature HIDE    Sample type ARTERIAL    Comment NOTIFIED PHYSICIAN   CBG monitoring, ED     Status: Abnormal   Collection Time: 02/07/19  5:36 PM  Result Value Ref Range   Glucose-Capillary 141 (H) 70 - 99 mg/dL   Comment 1 Notify RN    Comment 2 Document in Chart   Glucose, capillary     Status: Abnormal   Collection Time: 02/07/19  7:31 PM  Result Value Ref Range   Glucose-Capillary 117 (H) 70 - 99 mg/dL  MRSA PCR Screening     Status: None   Collection Time: 02/07/19  8:12 PM   Specimen: Nasopharyngeal  Result Value Ref Range   MRSA by PCR NEGATIVE NEGATIVE    Comment:        The GeneXpert MRSA Assay (FDA approved for NASAL specimens only), is one component of a comprehensive MRSA colonization surveillance program. It is not intended to diagnose MRSA infection nor to guide or monitor treatment for MRSA infections. Performed at [facility], [place] [address] [place], [place] [address]   Glucose, capillary     Status: Abnormal   Collection Time: 02/07/19 11:48 PM  Result Value Ref Range   Glucose-Capillary 154 (H) 70 - 99 mg/dL  Lactic acid, plasma     Status: None   Collection Time: 02/08/19 12:47 AM  Result Value Ref Range   Lactic Acid, Venous 1.3 0.5 - 1.9 mmol/L    Comment: Performed at [facility] [address] [place], [place] [address]  Hemoglobin  A1c     Status: None   Collection Time: 02/08/19 12:47 AM  Result Value Ref Range   Hgb A1c MFr Bld 5.2 4.8 - 5.6 %    Comment: (NOTE) Pre diabetes:          5.7%-6.4% Diabetes:              >6.4% Glycemic control for   <7.0% adults with diabetes    Mean Plasma Glucose 102.54 mg/dL    Comment: Performed at [facility] [address], [place], [place] [address]  Phenytoin level, total     Status: None   Collection Time: 02/08/19 12:47 AM  Result Value Ref Range   Phenytoin Lvl 18.5 10.0 - 20.0 ug/mL    Comment: Performed at [facility] [address], [place] [address]  CBC     Status: Abnormal   Collection Time: 02/08/19 12:56 AM  Result Value Ref Range   WBC 16.2 (H) 4.0 - 10.5 K/uL   RBC 6.19 (H) 3.87 -  5.11 MIL/uL   Hemoglobin 18.0 (H) 12.0 - 15.0 g/dL   HCT 52.3 (H) 36.0 - 46.0 %   MCV 84.5 80.0 - 100.0 fL   MCH 29.1 26.0 - 34.0 pg   MCHC 34.4 30.0 - 36.0 g/dL   RDW 18.7 (H) 11.5 - 15.5 %   Platelets 224 150 - 400 K/uL   nRBC 0.0 0.0 - 0.2 %    Comment: Performed at [facility] [address] [place], [place] [address]  Basic metabolic panel     Status: Abnormal   Collection Time: 02/08/19 12:56 AM  Result Value Ref Range   Sodium 135 135 - 145 mmol/L   Potassium 3.8 3.5 - 5.1 mmol/L   Chloride 105 98 - 111 mmol/L   CO2 21 (L) 22 - 32 mmol/L   Glucose, Bld 132 (H) 70 - 99 mg/dL   BUN 18 8 - 23 mg/dL   Creatinine, Ser 1.61 (H) 0.44 - 1.00 mg/dL   Calcium 8.6 (L) 8.9 - 10.3 mg/dL   GFR calc non Af Amer 32 (L) >60 mL/min   GFR calc Af Amer 37 (L) >60 mL/min   Anion gap 9 5 - 15    Comment: Performed at [facility] [address], [place], [place] [address]  Magnesium     Status: None   Collection Time: 02/08/19 12:56 AM  Result Value Ref Range   Magnesium 2.1 1.7 - 2.4 mg/dL    Comment: Performed at [facility] [address] [place], [place] [address]  Phosphorus     Status: None   Collection Time: 02/08/19 12:56  AM  Result Value Ref Range   Phosphorus 4.0 2.5 - 4.6 mg/dL    Comment: Performed at [facility] [address], [place], [place] [address]  HIV Antibody (routine testing w rflx)     Status: None   Collection Time: 02/08/19  1:11 AM  Result Value Ref Range   HIV Screen 4th Generation wRfx NON REACTIVE NON REACTIVE    Comment: Performed at [facility] [address], [place], [place] [address]  Glucose, capillary     Status: None   Collection Time: 02/08/19  3:41 AM  Result Value Ref Range   Glucose-Capillary 80 70 - 99 mg/dL  Ammonia     Status: Abnormal   Collection Time: 02/08/19  7:14 AM  Result Value Ref Range   Ammonia <9 (L) 9 - 35 umol/L    Comment: Performed at [facility], [place] [address] [place], [place] [address]  Glucose, capillary     Status: Abnormal   Collection Time: 02/08/19  7:16 AM  Result Value Ref Range   Glucose-Capillary <10 (LL) 70 - 99 mg/dL  Glucose, capillary     Status: Abnormal   Collection Time: 02/08/19  7:17 AM  Result Value Ref Range   Glucose-Capillary <10 (LL) 70 - 99 mg/dL  Glucose, capillary     Status: None   Collection Time: 02/08/19  7:19 AM  Result Value Ref Range   Glucose-Capillary 85 70 - 99 mg/dL    Recent Results (from the past 240 hour(s))  Respiratory Panel by RT PCR (Flu A&B, Covid) - Nasopharyngeal Swab     Status: None   Collection Time: 02/07/19  8:05 AM   Specimen: Nasopharyngeal Swab  Result Value Ref Range Status   SARS Coronavirus 2 by RT PCR NEGATIVE NEGATIVE Final    Comment: (  NOTE) SARS-CoV-2 target nucleic acids are NOT DETECTED. The SARS-CoV-2 RNA is generally detectable in upper respiratoy specimens during the acute phase of infection. The lowest concentration of SARS-CoV-2 viral copies this assay can detect is 131 copies/mL. A negative result does not preclude SARS-Cov-2 infection and should not be used as the sole basis for treatment or other patient management decisions. A negative  result may occur with  improper specimen collection/handling, submission of specimen other than nasopharyngeal swab, presence of viral mutation(s) within the areas targeted by this assay, and inadequate number of viral copies (<131 copies/mL). A negative result must be combined with clinical observations, patient history, and epidemiological information. The expected result is Negative. Fact Sheet for Patients:  PinkCheek.be Fact Sheet for Healthcare Providers:  GravelBags.it This test is not yet ap proved or cleared by the [place] FDA and  has been authorized for detection and/or diagnosis of SARS-CoV-2 by FDA under an Emergency Use Authorization (EUA). This EUA will remain  in effect (meaning this test can be used) for the duration of the COVID-19 declaration under Section 564(b)(1) of the Act, 21 U.S.C. section 360bbb-3(b)(1), unless the authorization is terminated or revoked sooner.    Influenza A by PCR NEGATIVE NEGATIVE Final   Influenza B by PCR NEGATIVE NEGATIVE Final    Comment: (NOTE) The Xpert Xpress SARS-CoV-2/FLU/RSV assay is intended as an aid in  the diagnosis of influenza from Nasopharyngeal swab specimens and  should not be used as a sole basis for treatment. Nasal washings and  aspirates are unacceptable for Xpert Xpress SARS-CoV-2/FLU/RSV  testing. Fact Sheet for Patients: PinkCheek.be Fact Sheet for Healthcare Providers: GravelBags.it This test is not yet approved or cleared by the [place] FDA and  has been authorized for detection and/or diagnosis of SARS-CoV-2 by  FDA under an Emergency Use Authorization (EUA). This EUA will remain  in effect (meaning this test can be used) for the duration of the  Covid-19 declaration under Section 564(b)(1) of the Act, 21  U.S.C. section 360bbb-3(b)(1), unless the authorization is  terminated or  revoked. Performed at [facility], [place] [address], [place], [place] [address]   MRSA PCR Screening     Status: None   Collection Time: 02/07/19  8:12 PM   Specimen: Nasopharyngeal  Result Value Ref Range Status   MRSA by PCR NEGATIVE NEGATIVE Final    Comment:        The GeneXpert MRSA Assay (FDA approved for NASAL specimens only), is one component of a comprehensive MRSA colonization surveillance program. It is not intended to diagnose MRSA infection nor to guide or monitor treatment for MRSA infections. Performed at [facility], [place] [address] [place], [place] [address]     Lipid Panel No results for input(s): CHOL, TRIG, HDL, CHOLHDL, VLDL, LDLCALC in the last 72 hours.  Studies/Results: EEG  Result Date: 02/07/2019 Lora Havens, MD     02/07/2019 10:59 AM Patient Name: Judy Chang MRN: [id number] Epilepsy Attending: Lora Havens Referring Physician/Provider: Dr. Kerney Elbe Date: 1/40/2021 Duration: 23.36 minutes Patient history: 69 year old female presented with altered mental status and seizure.  EEG evaluate for status epilepticus. Level of alertness: Comatose AEDs during EEG study: Ativan, Keppra, fosphenytoin Technical aspects: This EEG study was done with scalp electrodes positioned according to the 10-20 International system of electrode placement. Electrical activity was acquired at a sampling rate of '500Hz'  and reviewed with a high frequency filter of '70Hz'  and a low frequency filter  of '1Hz' . EEG data were recorded continuously and digitally stored. 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  Although it was difficult to visualize on camera, per neurology team patient had rhythmic jerking of left upper extremity and left gaze deviation.  EEG also showed continuous generalized 3-5 hz theta-delta slowing.  Hyperventilation and photic stimulation were not performed. Abnormality -Focal status epilepticus,  right temporoparietal region. 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  Additionally there is evidence of severe diffuse encephalopathy. Dr. Cheral Marker was immediately notified.   CT ANGIO HEAD W OR WO CONTRAST  Result Date: 02/07/2019 CLINICAL DATA:  Focal neurological deficit. Severe headache and possible seizures. EXAM: CT ANGIOGRAPHY HEAD AND NECK CT PERFUSION BRAIN TECHNIQUE: Multidetector CT imaging of the head and neck was performed using the standard protocol during bolus administration of intravenous contrast. Multiplanar CT image reconstructions and MIPs were obtained to evaluate the vascular anatomy. Carotid stenosis measurements (when applicable) are obtained utilizing NASCET criteria, using the distal internal carotid diameter as the denominator. Multiphase CT imaging of the brain was performed following IV bolus contrast injection. Subsequent parametric perfusion maps were calculated using RAPID software. CONTRAST:  119m OMNIPAQUE IOHEXOL 350 MG/ML SOLN COMPARISON:  Head CT earlier same day FINDINGS: CTA NECK FINDINGS Aortic arch: Mild aortic atherosclerosis. No aneurysm or dissection. Branching pattern is normal without origin stenosis. Right carotid system: Common carotid artery widely patent to the bifurcation. Soft and calcified plaque of the ICA bulb but no stenosis compared to the more distal cervical ICA. Left carotid system: Common carotid artery widely patent to the bifurcation. Soft and calcified plaque at the bifurcation and ICA bulb. Minimal diameter of the ICA bulb is 3.5 mm. Compared to a more distal cervical ICA diameter of the same, there is no stenosis. Vertebral arteries: Both vertebral artery origins are widely patent without stenosis. Both vertebral arteries appear normal through the cervical region. Skeleton: Ordinary cervical spondylosis. Other neck: Thyroid goiter without dominant  nodule. Upper chest: Normal Review of the MIP images confirms the above findings CTA HEAD FINDINGS Anterior circulation: Both internal carotid arteries are patent through the skull base and siphon regions. There is ordinary atherosclerotic calcification affecting the carotid siphons but without stenosis greater than 30%. The anterior and middle cerebral vessels are patent without large or medium vessel occlusion, proximal stenosis, aneurysm or vascular malformation. Posterior circulation: Both vertebral arteries are patent through the foramen magnum to the basilar. No basilar stenosis. Posterior circulation branch vessels are patent. Venous sinuses: Patent and normal. Anatomic variants: None significant. Review of the MIP images confirms the above findings CT Brain Perfusion Findings: ASPECTS: 10 CBF (<30%) Volume: 082mPerfusion (Tmax>6.0s) volume: 2m25mismatch Volume: 2mL106mfarction Location:None IMPRESSION: No acute finding.  Negative perfusion evaluation. No large or medium vessel occlusion. Atherosclerotic disease at both carotid bifurcations but without stenosis. Atherosclerotic disease in both carotid siphon regions but without narrowing greater than 30%. These results were communicated to Dr. LindCheral Marker9:16 amon 1/14/2021by text page via the [facility] Centersaging system. Electronically Signed   By: MarkNelson Chimes.   On: 02/07/2019 09:18   CT ANGIO NECK W OR WO CONTRAST  Result Date: 02/07/2019 CLINICAL DATA:  Focal neurological deficit. Severe headache and possible seizures. EXAM: CT ANGIOGRAPHY HEAD AND NECK CT PERFUSION BRAIN TECHNIQUE: Multidetector CT imaging of the head and neck was performed using the standard protocol during bolus administration of intravenous contrast. Multiplanar CT  image reconstructions and MIPs were obtained to evaluate the vascular anatomy. Carotid stenosis measurements (when applicable) are obtained utilizing NASCET criteria, using the distal internal carotid diameter as the  denominator. Multiphase CT imaging of the brain was performed following IV bolus contrast injection. Subsequent parametric perfusion maps were calculated using RAPID software. CONTRAST:  137m OMNIPAQUE IOHEXOL 350 MG/ML SOLN COMPARISON:  Head CT earlier same day FINDINGS: CTA NECK FINDINGS Aortic arch: Mild aortic atherosclerosis. No aneurysm or dissection. Branching pattern is normal without origin stenosis. Right carotid system: Common carotid artery widely patent to the bifurcation. Soft and calcified plaque of the ICA bulb but no stenosis compared to the more distal cervical ICA. Left carotid system: Common carotid artery widely patent to the bifurcation. Soft and calcified plaque at the bifurcation and ICA bulb. Minimal diameter of the ICA bulb is 3.5 mm. Compared to a more distal cervical ICA diameter of the same, there is no stenosis. Vertebral arteries: Both vertebral artery origins are widely patent without stenosis. Both vertebral arteries appear normal through the cervical region. Skeleton: Ordinary cervical spondylosis. Other neck: Thyroid goiter without dominant nodule. Upper chest: Normal Review of the MIP images confirms the above findings CTA HEAD FINDINGS Anterior circulation: Both internal carotid arteries are patent through the skull base and siphon regions. There is ordinary atherosclerotic calcification affecting the carotid siphons but without stenosis greater than 30%. The anterior and middle cerebral vessels are patent without large or medium vessel occlusion, proximal stenosis, aneurysm or vascular malformation. Posterior circulation: Both vertebral arteries are patent through the foramen magnum to the basilar. No basilar stenosis. Posterior circulation branch vessels are patent. Venous sinuses: Patent and normal. Anatomic variants: None significant. Review of the MIP images confirms the above findings CT Brain Perfusion Findings: ASPECTS: 10 CBF (<30%) Volume: 055mPerfusion (Tmax>6.0s)  volume: 25m19mismatch Volume: 25mL51mfarction Location:None IMPRESSION: No acute finding.  Negative perfusion evaluation. No large or medium vessel occlusion. Atherosclerotic disease at both carotid bifurcations but without stenosis. Atherosclerotic disease in both carotid siphon regions but without narrowing greater than 30%. These results were communicated to Dr. LindCheral Marker9:16 amon 1/14/2021by text page via the [facility]aging system. Electronically Signed   By: MarkNelson Chimes.   On: 02/07/2019 09:18   MR BRAIN W WO CONTRAST  Result Date: 02/07/2019 CLINICAL DATA:  Severe headache. Possible seizure. Abnormal neurological exam. EXAM: MRI HEAD WITHOUT AND WITH CONTRAST TECHNIQUE: Multiplanar, multiecho pulse sequences of the brain and surrounding structures were obtained without and with intravenous contrast. CONTRAST:  5mL 625mAVIST GADOBUTROL 1 MMOL/ML IV SOLN COMPARISON:  CT studies earlier same day FINDINGS: Brain: Diffusion imaging does not show any acute or subacute infarction. Minimal small vessel ischemic change of the pons. No cerebellar abnormality. Cerebral hemispheres show abnormal cortical and subcortical edema in the occipital and posterior parietal regions, with some involvement of the splenium of the corpus callosum. Findings likely to indicate posterior reversible encephalopathy. Patchy abnormal contrast enhancement in that region. Findings are most consistent with a subacute phase of posterior reversible encephalopathy. I did consider other diagnoses including vascular lymphoma or atypical infections with an unusual distribution but think those are un likely. No hemorrhage, hydrocephalus or extra-axial fluid collection. No evidence of pre-existing significant small-vessel disease. Vascular: Major vessels at the base of the brain show flow. Skull and upper cervical spine: Negative Sinuses/Orbits: Clear/normal Other: None IMPRESSION: Abnormal cortical and subcortical edema in the occipital and  posterior parietal brain bilaterally, with involvement also of the splenium  of the corpus callosum. Patchy/speckled contrast enhancement. Think the findings are most consistent with posterior reversible encephalopathy in the subacute phase. See above discussion. Electronically Signed   By: Nelson Chimes M.D.   On: 02/07/2019 11:25   CT CEREBRAL PERFUSION W CONTRAST  Result Date: 02/07/2019 CLINICAL DATA:  Focal neurological deficit. Severe headache and possible seizures. EXAM: CT ANGIOGRAPHY HEAD AND NECK CT PERFUSION BRAIN TECHNIQUE: Multidetector CT imaging of the head and neck was performed using the standard protocol during bolus administration of intravenous contrast. Multiplanar CT image reconstructions and MIPs were obtained to evaluate the vascular anatomy. Carotid stenosis measurements (when applicable) are obtained utilizing NASCET criteria, using the distal internal carotid diameter as the denominator. Multiphase CT imaging of the brain was performed following IV bolus contrast injection. Subsequent parametric perfusion maps were calculated using RAPID software. CONTRAST:  114m OMNIPAQUE IOHEXOL 350 MG/ML SOLN COMPARISON:  Head CT earlier same day FINDINGS: CTA NECK FINDINGS Aortic arch: Mild aortic atherosclerosis. No aneurysm or dissection. Branching pattern is normal without origin stenosis. Right carotid system: Common carotid artery widely patent to the bifurcation. Soft and calcified plaque of the ICA bulb but no stenosis compared to the more distal cervical ICA. Left carotid system: Common carotid artery widely patent to the bifurcation. Soft and calcified plaque at the bifurcation and ICA bulb. Minimal diameter of the ICA bulb is 3.5 mm. Compared to a more distal cervical ICA diameter of the same, there is no stenosis. Vertebral arteries: Both vertebral artery origins are widely patent without stenosis. Both vertebral arteries appear normal through the cervical region. Skeleton: Ordinary  cervical spondylosis. Other neck: Thyroid goiter without dominant nodule. Upper chest: Normal Review of the MIP images confirms the above findings CTA HEAD FINDINGS Anterior circulation: Both internal carotid arteries are patent through the skull base and siphon regions. There is ordinary atherosclerotic calcification affecting the carotid siphons but without stenosis greater than 30%. The anterior and middle cerebral vessels are patent without large or medium vessel occlusion, proximal stenosis, aneurysm or vascular malformation. Posterior circulation: Both vertebral arteries are patent through the foramen magnum to the basilar. No basilar stenosis. Posterior circulation branch vessels are patent. Venous sinuses: Patent and normal. Anatomic variants: None significant. Review of the MIP images confirms the above findings CT Brain Perfusion Findings: ASPECTS: 10 CBF (<30%) Volume: 075mPerfusion (Tmax>6.0s) volume: 30m77mismatch Volume: 30mL66mfarction Location:None IMPRESSION: No acute finding.  Negative perfusion evaluation. No large or medium vessel occlusion. Atherosclerotic disease at both carotid bifurcations but without stenosis. Atherosclerotic disease in both carotid siphon regions but without narrowing greater than 30%. These results were communicated to Dr. LindCheral Marker9:16 amon 1/14/2021by text page via the [facility]aging system. Electronically Signed   By: MarkNelson Chimes.   On: 02/07/2019 09:18   Overnight EEG with video  Result Date: 02/08/2019 YadaLora Havens     02/08/2019  8:23 AM Patient Name: Judy Chang: [id number] Attending: PriyLora Havenserring Physician/Provider: Dr EricKerney Elbeation: 02/07/2019 [id number]/15/2021 0 815 Patient history: 68 y67r old female presented with altered mental status and seizure.    Stat EEG showed status epilepticus therefore patient left for overnight video EEG monitoring. Level of alertness: Comatose/sedated AEDs during EEG study:  Phenytoin Technical aspects: This EEG study was done with scalp electrodes positioned according to the 10-20 International system of electrode placement. Electrical activity was acquired at a sampling rate of '500Hz'  and reviewed with a high frequency filter of '70Hz'   and a low frequency filter of '1Hz' . EEG data were recorded continuously and digitally stored. Description: At the beginning of the study, EEG showed sharp waves in right frontotemporal region (maximal F8).  EEG also showed continuous generalized 3 to 5 Hz theta-delta slowing admixed with 15 to 18 Hz generalized beta activity. Hyperventilation and photic stimulation were not performed. Abnormality -Sharp waves, right frontotemporal, maximal F8 -Continuous slow, generalized IMPRESSION: This study showed evidence of epileptogenic city in the right frontotemporal region.  There additionally, there was evidence of severe diffuse encephalopathy, nonspecific etiology but likely related to sedation. No seizures were seen throughout the recording. Lora Havens   CT HEAD CODE STROKE WO CONTRAST  Result Date: 02/07/2019 CLINICAL DATA:  Code stroke.  Severe headache with seizures. EXAM: CT HEAD WITHOUT CONTRAST TECHNIQUE: Contiguous axial images were obtained from the base of the skull through the vertex without intravenous contrast. COMPARISON:  None. FINDINGS: Brain: No evidence of acute infarction, hemorrhage, hydrocephalus, extra-axial collection or mass lesion/mass effect. Vascular: No convincing hyperdense vessel when accounting for streak artifact. Skull: Negative for fracture or bone lesion. Sinuses/Orbits: Negative Other: These results were communicated to Dr. Cheral Marker at 8:36 amon 1/14/2021by text page via the [facility] messaging system. ASPECTS [facility] Stroke Program Early CT Score) Not scored with this history IMPRESSION: No acute finding. Electronically Signed   By: Monte Fantasia M.D.   On: 02/07/2019 08:37    Medications:  Scheduled: .  Chlorhexidine Gluconate Cloth  6 each Topical Daily  . heparin  5,000 Units Subcutaneous Q8H  . insulin aspart  0-6 Units Subcutaneous Q4H  . phenytoin (DILANTIN) IV  100 mg Intravenous Q8H   Continuous: . clevidipine 2 mg/hr (02/08/19 0900)  . dexmedetomidine (PRECEDEX) IV infusion 0.2 mcg/kg/hr (02/08/19 0849)  . dextrose 5 % and 0.9% NaCl 50 mL/hr at 02/08/19 0912  . levETIRAcetam 400 mL/hr at 02/08/19 0900   LTM EEG report for this morning: This study showed evidence of epileptogenicity in the right frontotemporal region.  Additionally, there was evidence of severe diffuse encephalopathy, nonspecific to etiology but likely related to sedation. No seizures were seen throughout the recording.  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  1. Seizures resolved with anticonvulsants. LTM EEG shows evidence of  epileptogenicity in the right frontotemporal region. Also noted is continous generalized slowing, consistent with a diffuse encephalopathy. Electrographic seizures have resolved.  2. MRI brain showed findings consistent with PRES 3. PRES most likely secondary to poorly controlled BP at home 4. Exam today significantly improved. The patient is awake but confused and inattentive. She is oriented to the city only.  5. Repeat phenytoin level therapeutic at 18.5.  6. Polycythemia with hemoglobin of 18 and Hct of 52.3.   Recommendations: 1. LTM EEG is being discontinued 2. Continue to manage BP 3. Most likely can be transferred to a step-down unit.  4. Continue Keppra 500 mg BID and phenytoin 100 mg TID. Can switch to PO when she passes swallow evaluation.  5. Inpatient seizure precautions.  6. Repeat MRI as outpatient in 1-2 months to assess for improvement of the PRES-related signal abnormalities seen on yesterday's MRI.  7. Should be counseled to obtain a BP cuff and keep a BP diary  at home.  8. Will need outpatient Neurology follow up. At that time, if seizures have not recurred, Keppra or phenytoin should be tapered off, followed by repeat assessment in 1-2  months and then tapering off of the second anticonvulsant if still seizure free. She should not drive during this time period. Final decision on long-term driving restrictions to be made by her outpatient Neurologist or PCP.  9. Evaluation and management of polycythemia (no prior diagnosis of such listed in Epic).     LOS: 1 day   '@Electronically'  signed: Dr. Kerney Elbe 02/08/2019  9:38 AM

## 2019-02-08 NOTE — Procedures (Addendum)
Patient Name: Judy Chang  MRN: [id number]  Epilepsy Attending: Lora Havens  Referring Physician/Provider: Dr Kerney Elbe Duration: 02/07/2019 6256 to 02/08/2019 0926  Patient history: 69 year old female presented with altered mental status and seizure.    Stat EEG showed status epilepticus therefore patient left for overnight video EEG monitoring.  Level of alertness: Comatose/sedated  AEDs during EEG study: Phenytoin  Technical aspects: This EEG study was done with scalp electrodes positioned according to the 10-20 International system of electrode placement. Electrical activity was acquired at a sampling rate of 500Hz  and reviewed with a high frequency filter of 70Hz  and a low frequency filter of 1Hz . EEG data were recorded continuously and digitally stored.   Description: At the beginning of the study, EEG showed sharp waves in right frontotemporal region (maximal F8).  EEG also showed continuous generalized 3 to 5 Hz theta-delta slowing admixed with 15 to 18 Hz generalized beta activity. Hyperventilation and photic stimulation were not performed.  Abnormality -Sharp waves, right frontotemporal, maximal F8 -Continuous slow, generalized  IMPRESSION: This study showed evidence of epileptogenic city in the right frontotemporal region.  There additionally, there was evidence of severe diffuse encephalopathy, nonspecific etiology but likely related to sedation. No seizures were seen throughout the recording.  Caston Coopersmith Barbra Sarks

## 2019-02-08 NOTE — Procedures (Signed)
Arterial Catheter Insertion Procedure Note ELAF CLAUSON [id number] January 26, 1950  Procedure: Insertion of Arterial Catheter  Indications: Blood pressure monitoring  Procedure Details Consent: Risks of procedure as well as the alternatives and risks of each were explained to the (patient/caregiver).  Consent for procedure obtained. Time Out: Verified patient identification, verified procedure, site/side was marked, verified correct patient position, special equipment/implants available, medications/allergies/relevent history reviewed, required imaging and test results available.  Performed  Maximum sterile technique was used including antiseptics, cap, gloves, gown, hand hygiene, mask and sheet. Skin prep: Chlorhexidine; local anesthetic administered 20 gauge catheter was inserted into right radial artery using the Seldinger technique. ULTRASOUND GUIDANCE USED: NO Evaluation Blood flow good; BP tracing good. Complications: No apparent complications.   Clance Boll 02/08/2019

## 2019-02-08 NOTE — Progress Notes (Signed)
NAME:  Judy Chang, MRN:  [id number], DOB:  [birth year], LOS: 1 ADMISSION DATE:  02/07/2019, CONSULTATION DATE:  1/14 REFERRING MD:  Plunket, CHIEF COMPLAINT: PRES, seizure and hypertensive crisis   Brief History   69 year old female admitted with working diagnosis of PRES, hypertensive crisis and associated status epilepticus on 1/14  Past Medical History  Coronary artery disease, hypertension, hyperlipidemia.  Spinal stenosis requiring daily oxycodone.  Significant Hospital Events   14/61 69 year old female admitted with working diagnosis of PRES, hypertensive crisis and associated status epilepticus on 1/14. LTM started. Loaded w/ keppra. Received total of 14mg  lorazepam. Started on cleviprex gtt. PCCM asked to admit. ASA level: neg, APAP neg,   Consults:  Neuro   Procedures:    Significant Diagnostic Tests:  EEG 1/14 (initial): focal status epilepticus arising from right temporoparietal region.Additionally there is evidence of severe diffuse encephalopathy 1/14 after lorazepam and anticonvulsant load: Sharp waves seen in right frontotemporal region, maximal F8, No seizures noted 1/14 MRI brain: Abnormal cortical and subcortical edema in the occipital and posterior parietal brain bilaterally, with involvement also of the splenium of the corpus callosum. Patchy/speckled contrast enhancement. Think the findings are most consistent with posterior reversible encephalopathy in the subacute phase. See above discussion. 1/14 CT brain and neck: No acute finding.  Negative perfusion evaluation.No large or medium vessel occlusion.Atherosclerotic disease at both carotid bifurcations but without stenosis. Atherosclerotic disease in both carotid siphon regions but without narrowing greater than 30%.  Micro Data:  resp panel influenza A,B and COVID PCR: neg SARS PCR 1/14: neg  Antimicrobials:    Interim history/subjective:  Remains on cleviprex.  Objective   Blood pressure (!)  115/52, pulse (!) 57, temperature 97.6 F (36.4 C), temperature source Axillary, resp. rate (!) 25, height 5\' 1"  (1.549 m), weight 50.5 kg, SpO2 95 %.        Intake/Output Summary (Last 24 hours) at 02/08/2019 0747 Last data filed at 02/08/2019 0700 Gross per 24 hour  Intake 890.82 ml  Output 0 ml  Net 890.82 ml   Filed Weights   02/07/19 0918 02/08/19 0456  Weight: 54.4 kg 50.5 kg    Examination:  General - wiggly Eyes - pupils reactive ENT - no sinus tenderness, no stridor Cardiac - regular rate/rhythm, no murmur Chest - equal breath sounds b/l, no wheezing or rales Abdomen - soft, non tender, + bowel sounds Extremities - no cyanosis, clubbing, or edema Skin - no rashes Neuro - able to state name, moves extremities, not following commands consistently   Resolved Hospital Problem list   Anion gap metabolic acidosis with lactic acidosis  Assessment & Plan:   Hypertensive Emergency  - cleviprex for goal SBP < 160 - resume outpt norvasc, HCTZ, lisinopril, ASA when able to swallow pills  Status epilepticus in setting of PRES and HTN emergency  - AEDS per neurology  Acute metabolic encephalopathy. - precedex for RASS goal 0 - hold outpt prozac, neurontin, oxycodone, lyrica, zanaflex, trazodone, ultram  CKD stage III - monitor urine outpt  Polycythemia. - goal SpO2 > 92% - maintain euvolemia - f/u CBC  Hypoglycemia. - dextrose in IV fluid  Best practice:  Diet: NPO DVT prophylaxis: SQ heparin GI prophylaxis: not indicated Mobility: BR Code Status: full code  Disposition: ICU   Labs    CMP Latest Ref Rng & Units 02/08/2019 02/07/2019 02/07/2019  Glucose 70 - 99 mg/dL 132(H) - 199(H)  BUN 8 - 23 mg/dL 18 - 12  Creatinine 0.44 - 1.00  mg/dL 1.61(H) - 1.34(H)  Sodium 135 - 145 mmol/L 135 137 137  Potassium 3.5 - 5.1 mmol/L 3.8 3.3(L) 3.4(L)  Chloride 98 - 111 mmol/L 105 - 102  CO2 22 - 32 mmol/L 21(L) - 19(L)  Calcium 8.9 - 10.3 mg/dL 8.6(L) - 10.4(H)   Total Protein 6.5 - 8.1 g/dL - - 8.2(H)  Total Bilirubin 0.3 - 1.2 mg/dL - - 1.0  Alkaline Phos 38 - 126 U/L - - 109  AST 15 - 41 U/L - - 25  ALT 0 - 44 U/L - - 16    CBC Latest Ref Rng & Units 02/08/2019 02/07/2019 02/07/2019  WBC 4.0 - 10.5 K/uL 16.2(H) - 10.6(H)  Hemoglobin 12.0 - 15.0 g/dL 18.0(H) 21.4(HH) 21.6(HH)  Hematocrit 36.0 - 46.0 % 52.3(H) 63.0(H) 64.4(H)  Platelets 150 - 400 K/uL 224 - 235    ABG    Component Value Date/Time   PHART 7.378 02/07/2019 1553   PCO2ART 39.4 02/07/2019 1553   PO2ART 104.0 02/07/2019 1553   HCO3 23.2 02/07/2019 1553   TCO2 24 02/07/2019 1553   ACIDBASEDEF 2.0 02/07/2019 1553   O2SAT 98.0 02/07/2019 1553    CBG (last 3)  Recent Labs    02/08/19 0716 02/08/19 0717 02/08/19 0719  GLUCAP <10* <10* 85    CC time 31 minutes  Chesley Mires, MD [facility] Pulmonary/Critical Care 02/08/2019, 7:53 AM

## 2019-02-08 NOTE — Progress Notes (Signed)
LTM EEG discontinued - no skin breakdown at unhook.   

## 2019-02-09 LAB — CBC
HCT: 48.2 % — ABNORMAL HIGH (ref 36.0–46.0)
Hemoglobin: 16 g/dL — ABNORMAL HIGH (ref 12.0–15.0)
MCH: 28.9 pg (ref 26.0–34.0)
MCHC: 33.2 g/dL (ref 30.0–36.0)
MCV: 87 fL (ref 80.0–100.0)
Platelets: 220 10*3/uL (ref 150–400)
RBC: 5.54 MIL/uL — ABNORMAL HIGH (ref 3.87–5.11)
RDW: 18.2 % — ABNORMAL HIGH (ref 11.5–15.5)
WBC: 10 10*3/uL (ref 4.0–10.5)
nRBC: 0 % (ref 0.0–0.2)

## 2019-02-09 LAB — GLUCOSE, CAPILLARY
Glucose-Capillary: 132 mg/dL — ABNORMAL HIGH (ref 70–99)
Glucose-Capillary: 113 mg/dL — ABNORMAL HIGH (ref 70–99)
Glucose-Capillary: 101 mg/dL — ABNORMAL HIGH (ref 70–99)
Glucose-Capillary: 86 mg/dL (ref 70–99)
Glucose-Capillary: 107 mg/dL — ABNORMAL HIGH (ref 70–99)
Glucose-Capillary: 82 mg/dL (ref 70–99)

## 2019-02-09 LAB — BASIC METABOLIC PANEL
Anion gap: 8 (ref 5–15)
BUN: 21 mg/dL (ref 8–23)
CO2: 21 mmol/L — ABNORMAL LOW (ref 22–32)
Calcium: 8.5 mg/dL — ABNORMAL LOW (ref 8.9–10.3)
Chloride: 111 mmol/L (ref 98–111)
Creatinine, Ser: 1.58 mg/dL — ABNORMAL HIGH (ref 0.44–1.00)
GFR calc Af Amer: 38 mL/min — ABNORMAL LOW (ref >60)
GFR calc non Af Amer: 33 mL/min — ABNORMAL LOW (ref >60)
Glucose, Bld: 125 mg/dL — ABNORMAL HIGH (ref 70–99)
Potassium: 3.8 mmol/L (ref 3.5–5.1)
Sodium: 140 mmol/L (ref 135–145)

## 2019-02-09 LAB — TRIGLYCERIDES: Triglycerides: 163 mg/dL — ABNORMAL HIGH (ref <150)

## 2019-02-09 MED ORDER — CLONIDINE HCL 0.2 MG/24HR TD PTWK
0.2000 mg | MEDICATED_PATCH | TRANSDERMAL | Status: DC
  Administered 2019-02-09 – 2019-02-16 (×2): 0.2 mg via TRANSDERMAL
  Filled 2019-02-09 (×2): qty 1

## 2019-02-09 MED ORDER — WHITE PETROLATUM EX OINT
TOPICAL_OINTMENT | CUTANEOUS | Status: AC
  Filled 2019-02-09: qty 28.35

## 2019-02-09 MED ORDER — AMLODIPINE BESYLATE 10 MG PO TABS
10.0000 mg | ORAL_TABLET | Freq: Every day | ORAL | Status: DC
  Administered 2019-02-09 – 2019-02-17 (×9): 10 mg via ORAL
  Filled 2019-02-09 (×9): qty 1

## 2019-02-09 MED ORDER — QUETIAPINE FUMARATE 25 MG PO TABS
25.0000 mg | ORAL_TABLET | Freq: Two times a day (BID) | ORAL | Status: DC
  Administered 2019-02-09 (×2): 25 mg via ORAL
  Filled 2019-02-09 (×3): qty 1

## 2019-02-09 MED ORDER — LOSARTAN POTASSIUM 50 MG PO TABS
50.0000 mg | ORAL_TABLET | Freq: Every day | ORAL | Status: DC
  Administered 2019-02-09 – 2019-02-17 (×9): 50 mg via ORAL
  Filled 2019-02-09 (×9): qty 1

## 2019-02-09 NOTE — Evaluation (Signed)
Clinical/Bedside Swallow Evaluation Patient Details  Name: Judy Chang MRN: [id number] Date of Birth: 13-Apr-1950  Today's Date: 02/09/2019 Time: SLP Start Time (ACUTE ONLY): 1015 SLP Stop Time (ACUTE ONLY): 1037 SLP Time Calculation (min) (ACUTE ONLY): 22 min  Past Medical History:  Past Medical History:  Diagnosis Date  . 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 Anxiety   . Arthritis   . 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 Benign essential HTN 10/23/2015  . 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 DDD (degenerative disc disease), lumbar 01/10/2011   Noted on MRI  . Depression   . DJD (degenerative joint disease)   . GERD (gastroesophageal reflux disease)   . History of cataract    Bilateral  . Hypercholesterolemia   . Hypertension   . Labial tear 02/25/2018   Noted on MRI: smal Left anterior   . Lumbar spondylosis 01/10/2011   Noted on MRI  . Migraines   . Renal cyst 11/17/2015   Notedon [place] Abd: Simple, Right  . Spinal stenosis 09/06/2016   Noted on MRI: Moderate-Severe  . Vitreous floater, bilateral    Past Surgical History:  Past Surgical History:  Procedure Laterality Date  . COLONOSCOPY    . PARTIAL HYSTERECTOMY    . ROTATOR CUFF REPAIR Left   . TONSILLECTOMY    . TOTAL HIP ARTHROPLASTY Left 06/22/2018   Procedure: TOTAL HIP ARTHROPLASTY ANTERIOR APPROACH;  Surgeon: Dorna Leitz, MD;  Location: [facility];  Service: Orthopedics;  Laterality: Left;   HPI:  69 year old female admitted with working diagnosis of PRES, hypertensive crisis and associated status epilepticus on 1/14. LTM started. Loaded w/ keppra. MRI on 1/14 reported: "Abnormal cortical and subcortical edema in the occipital and posterior parietal brain bilaterally, with involvement also of the splenium of the corpus callosum. Patchy/speckled contrast enhancement. Think the findings are most consistent with posterior reversible encephalopathy in the  subacute phase."   Assessment / Plan / Recommendation Clinical Impression  Pt was seen for a bedside swallow evaluation and she presents with oral dysphagia.  Pt was encountered slouched down and lying sideways in bed with bilateral mitts in place.  NT assisted with repositioning pt.   Pt exhibited difficulty following commands therefore oral mechanism exam was limited; however, it was remarkable for generalized weakness, a white/yellow coating on her lingual surface (concerning for thrush), and missing dentition on the top.  Pt consumed trials of ice chips, thin liquid, and puree.  She achieved an appropriate labial seal with the spoon and straw given verbal cues.  Pt was observed to have decreased lingual manipulation and prolonged AP transport with all trials with suspected delayed swallow initiation.  She exhibited eructation and a delayed cough following 1/3 puree trials, otherwise no clinical s/sx of aspiration were observed.  Pt refused regular solid trials on this date.  Recommend initiation of Dysphagia 1 (puree) solids and thin liquids with full supervision to assist with feeding and to cue for the following compensatory strategies: 1) Small bites/sips 2) Slow rate of intake 3) Sit upright as possible 4) Awake/alert for PO intake 5) Remain upright for 30+ minutes after PO intake.  SLP will f/u to monitor diet tolerance and to determine readiness for clinical diet upgrade per POC.    SLP Visit Diagnosis: Dysphagia, unspecified (R13.10)    Aspiration Risk  Mild aspiration risk    Diet Recommendation Dysphagia 1 (Puree);Thin liquid   Liquid  Administration via: Cup;Straw Medication Administration: Crushed with puree Supervision: Staff to assist with self feeding;Full supervision/cueing for compensatory strategies Compensations: Minimize environmental distractions;Slow rate;Small sips/bites Postural Changes: Seated upright at 90 degrees;Remain upright for at least 30 minutes after po intake     Other  Recommendations Oral Care Recommendations: Oral care QID;Staff/trained caregiver to provide oral care   Follow up Recommendations Skilled Nursing facility;24 hour supervision/assistance      Frequency and Duration min 2x/week  2 weeks       Prognosis Prognosis for Safe Diet Advancement: Fair      Swallow Study   General HPI: 69 year old female admitted with working diagnosis of PRES, hypertensive crisis and associated status epilepticus on 1/14. LTM started. Loaded w/ keppra. MRI on 1/14 reported: "Abnormal cortical and subcortical edema in the occipital and posterior parietal brain bilaterally, with involvement also of the Type of Study: Bedside Swallow Evaluation Previous Swallow Assessment: None documented  Diet Prior to this Study: NPO Temperature Spikes Noted: No Respiratory Status: Nasal cannula History of Recent Intubation: No Behavior/Cognition: Alert;Impulsive;Confused;Requires cueing Oral Cavity Assessment: Dry Oral Care Completed by SLP: No Oral Cavity - Dentition: Missing dentition;Poor condition Self-Feeding Abilities: Needs assist Patient Positioning: Partially reclined Baseline Vocal Quality: Normal Volitional Cough: Congested Volitional Swallow: Unable to elicit    Oral/Motor/Sensory Function Overall Oral Motor/Sensory Function: Generalized oral weakness Facial Symmetry: Within Functional Limits Lingual ROM: Reduced right;Reduced left   Ice Chips Ice chips: Impaired Presentation: Spoon Oral Phase Impairments: Reduced lingual movement/coordination Oral Phase Functional Implications: Prolonged oral transit   Thin Liquid Thin Liquid: Impaired Presentation: Straw;Spoon Pharyngeal  Phase Impairments: Suspected delayed Swallow    Nectar Thick Nectar Thick Liquid: Not tested   Honey Thick Honey Thick Liquid: Not tested   Puree Puree: Impaired Presentation: Spoon Oral Phase Functional Implications: Prolonged oral transit Pharyngeal Phase Impairments:  Throat Clearing - Delayed   Solid     Solid: Not tested     Judy Chang M.S., CCC-SLP Acute Rehabilitation Services Office: [phone]  Judy Chang Judy Chang 02/09/2019,10:51 AM

## 2019-02-09 NOTE — Progress Notes (Signed)
Moving around in bed, found wrong way in the bed with suction pulled off purewick and bed wet. Total bbath given replaced purewick and mesh pants. Encouraged paitent to relax, staying in room with patient with frequent redirection

## 2019-02-09 NOTE — Progress Notes (Addendum)
NAME:  Judy Chang, MRN:  [id number], DOB:  1950/11/19, LOS: 2 ADMISSION DATE:  02/07/2019, CONSULTATION DATE:  1/14 REFERRING MD:  Plunket, CHIEF COMPLAINT: PRES, seizure and hypertensive crisis   Brief History   69 year old female admitted with working diagnosis of PRES, hypertensive crisis and associated status epilepticus on 1/14  Past Medical History  Coronary artery disease, hypertension, hyperlipidemia.  Spinal stenosis requiring daily oxycodone.  Significant Hospital Events   14/43 69 year old female admitted with working diagnosis of PRES, hypertensive crisis and associated status epilepticus on 1/14. LTM started. Loaded w/ keppra. Received total of 14mg  lorazepam. Started on cleviprex gtt. PCCM asked to admit. ASA level: neg, APAP neg,   Consults:  Neuro   Procedures:    Significant Diagnostic Tests:  EEG 1/14 (initial): focal status epilepticus arising from right temporoparietal region.Additionally there is evidence of severe diffuse encephalopathy 1/14 after lorazepam and anticonvulsant load: Sharp waves seen in right frontotemporal region, maximal F8, No seizures noted 1/14 MRI brain: Abnormal cortical and subcortical edema in the occipital and posterior parietal brain bilaterally, with involvement also of the splenium of the corpus callosum. Patchy/speckled contrast enhancement. Think the findings are most consistent with posterior reversible encephalopathy in the subacute phase. See above discussion. 1/14 CT brain and neck: No acute finding.  Negative perfusion evaluation.No large or medium vessel occlusion.Atherosclerotic disease at both carotid bifurcations but without stenosis. Atherosclerotic disease in both carotid siphon regions but without narrowing greater than 30%.  Micro Data:  resp panel influenza A,B and COVID PCR: neg SARS PCR 1/14: neg  Antimicrobials:    Interim history/subjective:  Off cleviprex.  On precedex.  Remains confused but  pleasant.  Objective   Blood pressure 130/82, pulse (!) 49, temperature (!) 97.5 F (36.4 C), temperature source Oral, resp. rate 18, height 5\' 1"  (1.549 m), weight 50.6 kg, SpO2 98 %.        Intake/Output Summary (Last 24 hours) at 02/09/2019 0755 Last data filed at 02/09/2019 0600 Gross per 24 hour  Intake 1427.4 ml  Output 550 ml  Net 877.4 ml   Filed Weights   02/07/19 0918 02/08/19 0456 02/09/19 0338  Weight: 54.4 kg 50.5 kg 50.6 kg    Examination:  GEN: elderly woman squirming around in bed HEENT: MM dry, trachea midline CV: bradycardic, ext warm PULM: lung clear, no accessory muscle use GI: Soft, +BS EXT: +Muscle wasting NEURO: Moving all 4 ext  PSYCH: AO to self but not place or year SKIN: No rashes  Cr stable HgB 16 from 18 WBC/Plts good CBGs good  Resolved Hospital Problem list   Anion gap metabolic acidosis with lactic acidosis  Assessment & Plan:   Hypertensive Emergency  - improved, still not sure she can take PO - clonidine patch to get her off precedex and help with this  Status epilepticus in setting of PRES and HTN emergency  - AEDS per neurology - goal SBP < 818  Acute metabolic encephalopathy. - precedex for RASS goal 0, clonidine patch, try to wean - was on outpt prozac, neurontin, oxycodone, lyrica, zanaflex, trazodone, ultram; need to assess PO, await SLP input  CKD stage III - monitor urine outpt, stable  Polycythemia. - improved/resolved  Hypoglycemia. - dextrose in IV fluid, continue  Best practice:  Diet: NPO pending SLP input DVT prophylaxis: SQ heparin GI prophylaxis: not indicated Mobility: BR Code Status: full code  Disposition: ICU   Labs    CMP Latest Ref Rng & Units 02/09/2019 02/08/2019 02/07/2019  Glucose 70 - 99 mg/dL 125(H) 132(H) -  BUN 8 - 23 mg/dL 21 18 -  Creatinine 0.44 - 1.00 mg/dL 1.58(H) 1.61(H) -  Sodium 135 - 145 mmol/L 140 135 137  Potassium 3.5 - 5.1 mmol/L 3.8 3.8 3.3(L)  Chloride 98 - 111  mmol/L 111 105 -  CO2 22 - 32 mmol/L 21(L) 21(L) -  Calcium 8.9 - 10.3 mg/dL 8.5(L) 8.6(L) -  Total Protein 6.5 - 8.1 g/dL - - -  Total Bilirubin 0.3 - 1.2 mg/dL - - -  Alkaline Phos 38 - 126 U/L - - -  AST 15 - 41 U/L - - -  ALT 0 - 44 U/L - - -    CBC Latest Ref Rng & Units 02/09/2019 02/08/2019 02/07/2019  WBC 4.0 - 10.5 K/uL 10.0 16.2(H) -  Hemoglobin 12.0 - 15.0 g/dL 16.0(H) 18.0(H) 21.4(HH)  Hematocrit 36.0 - 46.0 % 48.2(H) 52.3(H) 63.0(H)  Platelets 150 - 400 K/uL 220 224 -    ABG    Component Value Date/Time   PHART 7.378 02/07/2019 1553   PCO2ART 39.4 02/07/2019 1553   PO2ART 104.0 02/07/2019 1553   HCO3 23.2 02/07/2019 1553   TCO2 24 02/07/2019 1553   ACIDBASEDEF 2.0 02/07/2019 1553   O2SAT 98.0 02/07/2019 1553    CBG (last 3)  Recent Labs    02/08/19 1957 02/08/19 2333 02/09/19 0334  GLUCAP 96 116* 132*    CC time 32 minutes  Erskine Emery MD [place] 02/09/2019, 7:55 AM

## 2019-02-09 NOTE — Progress Notes (Signed)
IP notified of potential exposure will get more data on visitor from previous nurse. For now patient is good, no need to test or quarantine. Please update IP if god daughter tests positive and if there was exposure to patient.

## 2019-02-09 NOTE — Progress Notes (Signed)
Patient moving all over the bed turing sideways c/o left hip pain but pointing to right. Aline tried to tape to read correctly but it is very dampened and has been most of hte shift going off cuff for BP to titrate clevaprex increased to 5 precedex staying at 0.6 for now. Took seroquel and swallowed well, speech already in evaluating and diet ordered.

## 2019-02-09 NOTE — Plan of Care (Signed)
Patient is continuing with confusion, attepting to get out of bed, turning sideways and backwards in bed, has little awareness of spatial domain.  BP labile, increase cleoprex as needed Nsg  Dx  Potential for  fall and injury related to history of drinking and confusion  Monitor eye patient  as continuously as possible Safety sitter if needed  Repotion to keep safe.  Medicate with PRN antianxiety agents to decrease restlessness and anxiety Mittens to decrease chance of pulling at lines, patient is a hard stick  Verbally redirect  Decrease the amount of tubings and lines if able- weaned off o2.

## 2019-02-09 NOTE — Progress Notes (Signed)
Husband of patient just called and stated the visitor that was here yesterday ( God- daughter) her daughter tested positive for COVID. The visitor is going to get tested right away. He will let us know results. Called [facility] ,she stated to call patients MD and IC

## 2019-02-10 LAB — GLUCOSE, CAPILLARY
Glucose-Capillary: 104 mg/dL — ABNORMAL HIGH (ref 70–99)
Glucose-Capillary: 103 mg/dL — ABNORMAL HIGH (ref 70–99)
Glucose-Capillary: 130 mg/dL — ABNORMAL HIGH (ref 70–99)
Glucose-Capillary: 75 mg/dL (ref 70–99)
Glucose-Capillary: 113 mg/dL — ABNORMAL HIGH (ref 70–99)
Glucose-Capillary: 70 mg/dL (ref 70–99)

## 2019-02-10 MED ORDER — FLUOXETINE HCL 20 MG PO CAPS
20.0000 mg | ORAL_CAPSULE | Freq: Every day | ORAL | Status: DC
  Administered 2019-02-10 – 2019-02-17 (×8): 20 mg via ORAL
  Filled 2019-02-10 (×8): qty 1

## 2019-02-10 MED ORDER — PHENYTOIN SODIUM EXTENDED 100 MG PO CAPS
100.0000 mg | ORAL_CAPSULE | Freq: Three times a day (TID) | ORAL | Status: DC
  Administered 2019-02-10 – 2019-02-12 (×8): 100 mg via ORAL
  Filled 2019-02-10 (×10): qty 1

## 2019-02-10 MED ORDER — QUETIAPINE FUMARATE 50 MG PO TABS
50.0000 mg | ORAL_TABLET | Freq: Two times a day (BID) | ORAL | Status: DC
  Administered 2019-02-10 – 2019-02-17 (×15): 50 mg via ORAL
  Filled 2019-02-10 (×6): qty 1
  Filled 2019-02-10: qty 2
  Filled 2019-02-10 (×8): qty 1

## 2019-02-10 MED ORDER — PREGABALIN 75 MG PO CAPS
75.0000 mg | ORAL_CAPSULE | Freq: Two times a day (BID) | ORAL | Status: DC
  Administered 2019-02-10 – 2019-02-17 (×15): 75 mg via ORAL
  Filled 2019-02-10 (×15): qty 1

## 2019-02-10 MED ORDER — CARVEDILOL 12.5 MG PO TABS
12.5000 mg | ORAL_TABLET | Freq: Two times a day (BID) | ORAL | Status: DC
  Administered 2019-02-10 – 2019-02-17 (×14): 12.5 mg via ORAL
  Filled 2019-02-10 (×14): qty 1

## 2019-02-10 MED ORDER — HYDRALAZINE HCL 20 MG/ML IJ SOLN
10.0000 mg | INTRAMUSCULAR | Status: DC | PRN
  Administered 2019-02-10 – 2019-02-12 (×3): 10 mg via INTRAVENOUS
  Filled 2019-02-10 (×2): qty 1

## 2019-02-10 NOTE — Progress Notes (Signed)
eLink Physician-Brief Progress Note Patient Name: Judy Chang DOB: [birth year] MRN: [id number]   Date of Service  02/10/2019  HPI/Events of Note    eICU Interventions  Sitter orders renewed.        [name] 02/10/2019, 4:24 AM

## 2019-02-10 NOTE — Progress Notes (Addendum)
NAME:  Judy Chang, MRN:  [id number], DOB:  Aug 18, 1950, LOS: 3 ADMISSION DATE:  02/07/2019, CONSULTATION DATE:  1/14 REFERRING MD:  Plunket, CHIEF COMPLAINT: PRES, seizure and hypertensive crisis   Brief History   69 year old female admitted with working diagnosis of PRES, hypertensive crisis and associated status epilepticus on 1/14  Past Medical History  Coronary artery disease, hypertension, hyperlipidemia.  Spinal stenosis requiring daily oxycodone.  Significant Hospital Events   37/53 69 year old female admitted with working diagnosis of PRES, hypertensive crisis and associated status epilepticus on 1/14. LTM started. Loaded w/ keppra. Received total of 14mg  lorazepam. Started on cleviprex gtt. PCCM asked to admit. ASA level: neg, APAP neg,   Consults:  Neuro   Procedures:    Significant Diagnostic Tests:  EEG 1/14 (initial): focal status epilepticus arising from right temporoparietal region.Additionally there is evidence of severe diffuse encephalopathy 1/14 after lorazepam and anticonvulsant load: Sharp waves seen in right frontotemporal region, maximal F8, No seizures noted 1/14 MRI brain: Abnormal cortical and subcortical edema in the occipital and posterior parietal brain bilaterally, with involvement also of the splenium of the corpus callosum. Patchy/speckled contrast enhancement. Think the findings are most consistent with posterior reversible encephalopathy in the subacute phase. See above discussion. 1/14 CT brain and neck: No acute finding.  Negative perfusion evaluation.No large or medium vessel occlusion.Atherosclerotic disease at both carotid bifurcations but without stenosis. Atherosclerotic disease in both carotid siphon regions but without narrowing greater than 30%.  Micro Data:  resp panel influenza A,B and COVID PCR: neg SARS PCR 1/14: neg  Antimicrobials:    Interim history/subjective:  Seems more calm today but apparently has periods of severe  agitation.  Remains on precedex.  Objective   Blood pressure 121/62, pulse 66, temperature (!) 97.4 F (36.3 C), temperature source Oral, resp. rate (!) 39, height 5\' 1"  (1.549 m), weight 49.9 kg, SpO2 92 %.        Intake/Output Summary (Last 24 hours) at 02/10/2019 0926 Last data filed at 02/10/2019 0800 Gross per 24 hour  Intake 1927.72 ml  Output 600 ml  Net 1327.72 ml   Filed Weights   02/08/19 0456 02/09/19 0338 02/10/19 0500  Weight: 50.5 kg 50.6 kg 49.9 kg    Examination:  GEN: elderly woman squirming around in bed HEENT: MM dry, trachea midline CV: RRR, ext warm PULM: lung clear, no accessory muscle use GI: Soft, +BS EXT: +Muscle wasting NEURO: Moving all 4 ext  PSYCH: AO to self but not place or year SKIN: No rashes  No new labs today  Resolved Hospital Problem list   Anion gap metabolic acidosis with lactic acidosis  Assessment & Plan:   Hypertensive Emergency  - clonidine patch, amlodipine, goal SBP < 140  Status epilepticus in setting of PRES and HTN emergency  - AEDS per neurology - goal SBP < 941  Acute metabolic encephalopathy- PRES/postictal and tons of home chronic pain/anxiety meds - precedex for RASS goal 0, clonidine patch, try to wean - going to restart some of her home meds in case there is element of withdraw: lyrica and fluoxetine to start; would prefer to avoid resuming tramadol  CKD stage III - monitor urine outpt, stable  Polycythemia. - improved/resolved  Hypoglycemia. - dextrose in IV fluid, continue  Best practice:  Diet: heart healthy DVT prophylaxis: SQ heparin GI prophylaxis: not indicated Mobility: BR Code Status: full code  Disposition: ICU pending precedex wean  Labs    CMP Latest Ref Rng & Units 02/09/2019  02/08/2019 02/07/2019  Glucose 70 - 99 mg/dL 125(H) 132(H) -  BUN 8 - 23 mg/dL 21 18 -  Creatinine 0.44 - 1.00 mg/dL 1.58(H) 1.61(H) -  Sodium 135 - 145 mmol/L 140 135 137  Potassium 3.5 - 5.1 mmol/L 3.8 3.8  3.3(L)  Chloride 98 - 111 mmol/L 111 105 -  CO2 22 - 32 mmol/L 21(L) 21(L) -  Calcium 8.9 - 10.3 mg/dL 8.5(L) 8.6(L) -  Total Protein 6.5 - 8.1 g/dL - - -  Total Bilirubin 0.3 - 1.2 mg/dL - - -  Alkaline Phos 38 - 126 U/L - - -  AST 15 - 41 U/L - - -  ALT 0 - 44 U/L - - -    CBC Latest Ref Rng & Units 02/09/2019 02/08/2019 02/07/2019  WBC 4.0 - 10.5 K/uL 10.0 16.2(H) -  Hemoglobin 12.0 - 15.0 g/dL 16.0(H) 18.0(H) 21.4(HH)  Hematocrit 36.0 - 46.0 % 48.2(H) 52.3(H) 63.0(H)  Platelets 150 - 400 K/uL 220 224 -    ABG    Component Value Date/Time   PHART 7.378 02/07/2019 1553   PCO2ART 39.4 02/07/2019 1553   PO2ART 104.0 02/07/2019 1553   HCO3 23.2 02/07/2019 1553   TCO2 24 02/07/2019 1553   ACIDBASEDEF 2.0 02/07/2019 1553   O2SAT 98.0 02/07/2019 1553    CBG (last 3)  Recent Labs    02/09/19 2308 02/10/19 0332 02/10/19 0712  GLUCAP 82 104* 103*    CC time 31 minutes  Erskine Emery MD [place] 02/10/2019, 9:26 AM

## 2019-02-10 NOTE — Progress Notes (Signed)
Right radial aline d/c pressure held x 10 minutes, hemostasis achieved. Pressure dressing applied. Area slight bruise.

## 2019-02-10 NOTE — Progress Notes (Signed)
3 rings on left ring finger and 2 on right ring finger white metal with white stones. Bracelet tennis with white stones on left wrist

## 2019-02-10 NOTE — Progress Notes (Signed)
Sitter at bedside redirecting patient, much calmer than yesterday weaning down precedex. Patient still confused to place time, responds to person, mitts off, contracted with patient not to pull at anything.

## 2019-02-10 NOTE — Progress Notes (Signed)
[place] Progress Note Patient Name: Judy Chang DOB: 13-Apr-1950 MRN: [id number]   Date of Service  02/10/2019  HPI/Events of Note  Hypertension - BP = 183/95 with MAP = 119.   eICU Interventions  Will order: 1. Hydralazine 10 mg IV Q 4 hours PRN SBP > 170 or DBP > 100.      Intervention Category Major Interventions: Hypertension - evaluation and management  Wendelyn Kiesling [place] 02/10/2019, 11:11 PM

## 2019-02-11 ENCOUNTER — Encounter (HOSPITAL_COMMUNITY): Payer: Self-pay | Admitting: Pulmonary Disease

## 2019-02-11 LAB — GLUCOSE, CAPILLARY
Glucose-Capillary: 131 mg/dL — ABNORMAL HIGH (ref 70–99)
Glucose-Capillary: 158 mg/dL — ABNORMAL HIGH (ref 70–99)
Glucose-Capillary: 384 mg/dL — ABNORMAL HIGH (ref 70–99)
Glucose-Capillary: 57 mg/dL — ABNORMAL LOW (ref 70–99)
Glucose-Capillary: 66 mg/dL — ABNORMAL LOW (ref 70–99)
Glucose-Capillary: 73 mg/dL (ref 70–99)
Glucose-Capillary: 82 mg/dL (ref 70–99)
Glucose-Capillary: 88 mg/dL (ref 70–99)

## 2019-02-11 LAB — BASIC METABOLIC PANEL
Anion gap: 13 (ref 5–15)
BUN: 15 mg/dL (ref 8–23)
CO2: 20 mmol/L — ABNORMAL LOW (ref 22–32)
Calcium: 8.8 mg/dL — ABNORMAL LOW (ref 8.9–10.3)
Chloride: 106 mmol/L (ref 98–111)
Creatinine, Ser: 1.35 mg/dL — ABNORMAL HIGH (ref 0.44–1.00)
GFR calc Af Amer: 46 mL/min — ABNORMAL LOW (ref >60)
GFR calc non Af Amer: 40 mL/min — ABNORMAL LOW (ref >60)
Glucose, Bld: 77 mg/dL (ref 70–99)
Potassium: 3.1 mmol/L — ABNORMAL LOW (ref 3.5–5.1)
Sodium: 139 mmol/L (ref 135–145)

## 2019-02-11 LAB — CBC
HCT: 53.7 % — ABNORMAL HIGH (ref 36.0–46.0)
Hemoglobin: 18.6 g/dL — ABNORMAL HIGH (ref 12.0–15.0)
MCH: 29.3 pg (ref 26.0–34.0)
MCHC: 34.6 g/dL (ref 30.0–36.0)
MCV: 84.6 fL (ref 80.0–100.0)
Platelets: 177 10*3/uL (ref 150–400)
RBC: 6.35 MIL/uL — ABNORMAL HIGH (ref 3.87–5.11)
RDW: 18.1 % — ABNORMAL HIGH (ref 11.5–15.5)
WBC: 9.3 10*3/uL (ref 4.0–10.5)
nRBC: 0 % (ref 0.0–0.2)

## 2019-02-11 MED ORDER — DEXTROSE 50 % IV SOLN
INTRAVENOUS | Status: AC
  Administered 2019-02-12: 12.5 g via INTRAVENOUS
  Filled 2019-02-11: qty 50

## 2019-02-11 MED ORDER — POTASSIUM CHLORIDE CRYS ER 20 MEQ PO TBCR
30.0000 meq | EXTENDED_RELEASE_TABLET | ORAL | Status: AC
  Administered 2019-02-11 (×2): 30 meq via ORAL
  Filled 2019-02-11 (×2): qty 1

## 2019-02-11 MED ORDER — SODIUM CHLORIDE 0.9 % IV SOLN
INTRAVENOUS | Status: DC | PRN
  Administered 2019-02-11 – 2019-02-12 (×2): 250 mL via INTRAVENOUS

## 2019-02-11 NOTE — Progress Notes (Signed)
NAME:  Judy Chang, MRN:  [id number], DOB:  17-Feb-1950, LOS: 4 ADMISSION DATE:  02/07/2019, CONSULTATION DATE:  1/14 REFERRING MD:  Plunket, CHIEF COMPLAINT: PRES, seizure and hypertensive crisis   Brief History   69 year old female admitted with working diagnosis of PRES, hypertensive crisis and associated status epilepticus on 1/14  Past Medical History  Coronary artery disease, hypertension, hyperlipidemia.  Spinal stenosis requiring daily oxycodone.  Significant Hospital Events   39/74 69 year old female admitted with working diagnosis of PRES, hypertensive crisis and associated status epilepticus on 1/14. LTM started. Loaded w/ keppra. Received total of 14mg  lorazepam. Started on cleviprex gtt. PCCM asked to admit. ASA level: neg, APAP neg,   Consults:  Neuro   Procedures:    Significant Diagnostic Tests:  EEG 1/14 (initial): focal status epilepticus arising from right temporoparietal region.Additionally there is evidence of severe diffuse encephalopathy 1/14 after lorazepam and anticonvulsant load: Sharp waves seen in right frontotemporal region, maximal F8, No seizures noted 1/14 MRI brain: Abnormal cortical and subcortical edema in the occipital and posterior parietal brain bilaterally, with involvement also of the splenium of the corpus callosum. Patchy/speckled contrast enhancement. Think the findings are most consistent with posterior reversible encephalopathy in the subacute phase. See above discussion. 1/14 CT brain and neck: No acute finding.  Negative perfusion evaluation.No large or medium vessel occlusion.Atherosclerotic disease at both carotid bifurcations but without stenosis. Atherosclerotic disease in both carotid siphon regions but without narrowing greater than 30%.  Micro Data:  resp panel influenza A,B and COVID PCR: neg SARS PCR 1/14: neg  Antimicrobials:    Interim history/subjective:  Remains off Precedex since yesterday am. Remains confused but  easily re-oriented.  Objective   Blood pressure (!) 144/109, pulse 80, temperature 98.1 F (36.7 C), temperature source Oral, resp. rate 19, height 5\' 1"  (1.549 m), weight 49.6 kg, SpO2 91 %.        Intake/Output Summary (Last 24 hours) at 02/11/2019 0929 Last data filed at 02/11/2019 0900 Gross per 24 hour  Intake 606.45 ml  Output 1150 ml  Net -543.55 ml   Filed Weights   02/09/19 0338 02/10/19 0500 02/11/19 0500  Weight: 50.6 kg 49.9 kg 49.6 kg    Examination:  GEN: elderly woman squirming around in bed HEENT: MM dry, trachea midline CV: RRR, ext warm PULM: lung clear, no accessory muscle use GI: Soft, +BS EXT: +Muscle wasting NEURO: Moving all 4 ext  PSYCH: AO to self but not place or year SKIN: No rashes  No new labs today  Resolved Hospital Problem list   Anion gap metabolic acidosis with lactic acidosis   Assessment & Plan:   Hypertensive Emergency  - clonidine patch, amlodipine, losartan, coreg goal for SBP < 140 - PRN hydralazine  Status epilepticus in setting of PRES and HTN emergency  - AEDS per neurology. Consult team recommends weaning AEDS in one month and will arrange outpatient follow-up when discharged - goal SBP < 409  Acute metabolic encephalopathy/Hypertensive encephalopathy- PRES/postictal and tons of home chronic pain/anxiety meds - Off precedex since yesterday - Restarted on home meds in case there is element of withdraw: lyrica and fluoxetine; would prefer to avoid resuming tramadol - Seroquel BID. QTc reviewed and appropriate  Hypokalemia - Replete - Trend BMP  CKD stage III - monitor urine outpt, stable  Polycythemia. - improved/resolved  Hypoglycemia - improved - Discontinued D5gtt - Healthy heart diet  Best practice:  Diet: heart healthy DVT prophylaxis: SQ heparin GI prophylaxis: not indicated Mobility:  BR Code Status: full code  Disposition: Transfer to SDU. Contact [facility] for transfer of service  Labs    CMP Latest  Ref Rng & Units 02/11/2019 02/09/2019 02/08/2019  Glucose 70 - 99 mg/dL 77 125(H) 132(H)  BUN 8 - 23 mg/dL 15 21 18   Creatinine 0.44 - 1.00 mg/dL 1.35(H) 1.58(H) 1.61(H)  Sodium 135 - 145 mmol/L 139 140 135  Potassium 3.5 - 5.1 mmol/L 3.1(L) 3.8 3.8  Chloride 98 - 111 mmol/L 106 111 105  CO2 22 - 32 mmol/L 20(L) 21(L) 21(L)  Calcium 8.9 - 10.3 mg/dL 8.8(L) 8.5(L) 8.6(L)  Total Protein 6.5 - 8.1 g/dL - - -  Total Bilirubin 0.3 - 1.2 mg/dL - - -  Alkaline Phos 38 - 126 U/L - - -  AST 15 - 41 U/L - - -  ALT 0 - 44 U/L - - -    CBC Latest Ref Rng & Units 02/11/2019 02/09/2019 02/08/2019  WBC 4.0 - 10.5 K/uL 9.3 10.0 16.2(H)  Hemoglobin 12.0 - 15.0 g/dL 18.6(H) 16.0(H) 18.0(H)  Hematocrit 36.0 - 46.0 % 53.7(H) 48.2(H) 52.3(H)  Platelets 150 - 400 K/uL 177 220 224    ABG    Component Value Date/Time   PHART 7.378 02/07/2019 1553   PCO2ART 39.4 02/07/2019 1553   PO2ART 104.0 02/07/2019 1553   HCO3 23.2 02/07/2019 1553   TCO2 24 02/07/2019 1553   ACIDBASEDEF 2.0 02/07/2019 1553   O2SAT 98.0 02/07/2019 1553    CBG (last 3)  Recent Labs    02/10/19 2322 02/11/19 0339 02/11/19 0721  GLUCAP 70 82 73    The patient requires high complexity decision making for assessment and support, frequent evaluation and titration of therapies, application of advanced monitoring technologies and extensive interpretation of multiple databases.   Care Time devoted to patient care services described in this note is 33 Minutes.  Rodman Pickle, M.D. [facility] Pulmonary/Critical Care Medicine 02/11/2019 9:29 AM   Please see Amion for pager number to reach on-call Pulmonary and Critical Care Team.

## 2019-02-11 NOTE — Progress Notes (Addendum)
Spoke to [employer] who will take over care at 7 am on 1/19. Greatly appreciate their help with our  patient.

## 2019-02-11 NOTE — Progress Notes (Signed)
  Speech Language Pathology Treatment: Dysphagia  Patient Details Name: Judy Chang MRN: [id number] DOB: Jan 12, 1951 Today's Date: 02/11/2019 Time: 1130-1140 SLP Time Calculation (min) (ACUTE ONLY): 10 min  Assessment / Plan / Recommendation Clinical Impression  Pt more alert and participatory today; confusion persists. Her diet was advanced to regulars over the weekend, which she appears to be tolerating without concerns for aspiration or a true dysphagia.  She is missing top dentures, but states she would prefer to stay on regular solids to allow variety of choices.  Large, consecutive straw sips of thins and mixed solid/thin liquid consistencies did not elicit s/s of aspiration. Recommend continuing current diet; assist with tray set-up and feeding as needed due to confusion.  SLP will D/C pt from swallowing service.   HPI HPI: 69 year old female admitted with working diagnosis of PRES, hypertensive crisis and associated status epilepticus on 1/14. LTM started. Loaded w/ keppra. MRI on 1/14 reported: "Abnormal cortical and subcortical edema in the occipital and posterior parietal brain bilaterally, with involvement also of the splenium of the corpus callosum. Patchy/speckled contrast enhancement. Think the findings are most consistent with posterior reversible encephalopathy in the subacute phase."       SLP Plan  All goals met       Recommendations  Diet recommendations: Regular;Thin liquid Liquids provided via: Cup;Straw Medication Administration: Crushed with puree Supervision: Staff to assist with self feeding Compensations: Minimize environmental distractions Postural Changes and/or Swallow Maneuvers: Seated upright 90 degrees                Oral Care Recommendations: Oral care BID SLP Visit Diagnosis: Dysphagia, unspecified (R13.10) Plan: All goals met       GO                Judy Chang 02/11/2019, 12:22 PM  Judy Chang L. Tivis Ringer, [place] Office number [phone] Pager [phone]

## 2019-02-11 NOTE — Progress Notes (Signed)
Subjective: Nursing reports improvement compared to previous days.   Exam: Vitals:   02/11/19 0700 02/11/19 0723  BP: (!) 142/82   Pulse: 78   Resp: (!) 25   Temp:  98.1 F (36.7 C)  SpO2: 100%    Gen: In bed, NAD Resp: non-labored breathing, no acute distress Abd: soft, nt  Neuro: MS: awake, alert, gives month as January, but year as 2001. Knows she is at [facility].  CN:VFF, EOMI Motor: Moves all extremities well Sensory:endorses symmetric sensation, no extinction to DSS  Pertinent Labs: Cr 1.35  Impression: 69 yo F who presented with status epilepticus in the setting of posterior reversible encephalopathy syndrome(PRES). She is improving, and I would expect continued gradual improvement. She will not likely need dual anti-epileptic therapy in the long term, but1 given that she was difficult to control, would continue current regimen for a month or so, then begin weaning off. I would wean dilantin first, then keppra.   Her etiology is hypetension, and I would favor aggressive control of her elevated blood pressures as continued hypertension, or especially fluctuations in BP, will likely prolong her encephalopathy. I would expect slow continued improvement as long as BP is controlled.   Recommendations: 1) Keppra 500mg  BID 2) Dilantin 100mg  TID, would begin weaning  3) BP control.  4) Neurology will be available on an as needed basis moving forward, please call with questions or concerns.   Roland Rack, MD [facility] [phone]  If 7pm- 7am, please page neurology on call as listed in [place].

## 2019-02-12 LAB — COMPREHENSIVE METABOLIC PANEL
ALT: 16 U/L (ref 0–44)
AST: 22 U/L (ref 15–41)
Albumin: 2.8 g/dL — ABNORMAL LOW (ref 3.5–5.0)
Alkaline Phosphatase: 77 U/L (ref 38–126)
Anion gap: 10 (ref 5–15)
BUN: 24 mg/dL — ABNORMAL HIGH (ref 8–23)
CO2: 24 mmol/L (ref 22–32)
Calcium: 9.2 mg/dL (ref 8.9–10.3)
Chloride: 106 mmol/L (ref 98–111)
Creatinine, Ser: 1.52 mg/dL — ABNORMAL HIGH (ref 0.44–1.00)
GFR calc Af Amer: 40 mL/min — ABNORMAL LOW (ref >60)
GFR calc non Af Amer: 34 mL/min — ABNORMAL LOW (ref >60)
Glucose, Bld: 109 mg/dL — ABNORMAL HIGH (ref 70–99)
Potassium: 3.7 mmol/L (ref 3.5–5.1)
Sodium: 140 mmol/L (ref 135–145)
Total Bilirubin: 0.7 mg/dL (ref 0.3–1.2)
Total Protein: 6.3 g/dL — ABNORMAL LOW (ref 6.5–8.1)

## 2019-02-12 LAB — CBC WITH DIFFERENTIAL/PLATELET
Abs Immature Granulocytes: 0.02 10*3/uL (ref 0.00–0.07)
Basophils Absolute: 0.1 10*3/uL (ref 0.0–0.1)
Basophils Relative: 1 %
Eosinophils Absolute: 0.1 10*3/uL (ref 0.0–0.5)
Eosinophils Relative: 1 %
HCT: 52.1 % — ABNORMAL HIGH (ref 36.0–46.0)
Hemoglobin: 17.7 g/dL — ABNORMAL HIGH (ref 12.0–15.0)
Immature Granulocytes: 0 %
Lymphocytes Relative: 19 %
Lymphs Abs: 1.5 10*3/uL (ref 0.7–4.0)
MCH: 28.9 pg (ref 26.0–34.0)
MCHC: 34 g/dL (ref 30.0–36.0)
MCV: 85 fL (ref 80.0–100.0)
Monocytes Absolute: 1 10*3/uL (ref 0.1–1.0)
Monocytes Relative: 12 %
Neutro Abs: 5.1 10*3/uL (ref 1.7–7.7)
Neutrophils Relative %: 67 %
Platelets: 175 10*3/uL (ref 150–400)
RBC: 6.13 MIL/uL — ABNORMAL HIGH (ref 3.87–5.11)
RDW: 17.6 % — ABNORMAL HIGH (ref 11.5–15.5)
WBC: 7.8 10*3/uL (ref 4.0–10.5)
nRBC: 0 % (ref 0.0–0.2)

## 2019-02-12 LAB — GLUCOSE, CAPILLARY
Glucose-Capillary: 145 mg/dL — ABNORMAL HIGH (ref 70–99)
Glucose-Capillary: 148 mg/dL — ABNORMAL HIGH (ref 70–99)
Glucose-Capillary: 149 mg/dL — ABNORMAL HIGH (ref 70–99)
Glucose-Capillary: 170 mg/dL — ABNORMAL HIGH (ref 70–99)
Glucose-Capillary: 75 mg/dL (ref 70–99)
Glucose-Capillary: 83 mg/dL (ref 70–99)
Glucose-Capillary: 92 mg/dL (ref 70–99)
Glucose-Capillary: <10 mg/dL — CL (ref 70–99)
Glucose-Capillary: <10 mg/dL — CL (ref 70–99)

## 2019-02-12 LAB — MAGNESIUM: Magnesium: 1.5 mg/dL — ABNORMAL LOW (ref 1.7–2.4)

## 2019-02-12 LAB — PHOSPHORUS: Phosphorus: 2.1 mg/dL — ABNORMAL LOW (ref 2.5–4.6)

## 2019-02-12 MED ORDER — K PHOS MONO-SOD PHOS DI & MONO 155-852-130 MG PO TABS
500.0000 mg | ORAL_TABLET | Freq: Two times a day (BID) | ORAL | Status: AC
  Administered 2019-02-12 – 2019-02-13 (×2): 500 mg via ORAL
  Filled 2019-02-12 (×2): qty 2

## 2019-02-12 MED ORDER — DEXTROSE 50 % IV SOLN: 12.5000 g | Freq: Once | INTRAVENOUS | Status: AC

## 2019-02-12 MED ORDER — MAGNESIUM SULFATE 2 GM/50ML IV SOLN
2.0000 g | Freq: Once | INTRAVENOUS | Status: AC
  Administered 2019-02-12: 2 g via INTRAVENOUS
  Filled 2019-02-12: qty 50

## 2019-02-12 NOTE — Plan of Care (Signed)

## 2019-02-12 NOTE — Progress Notes (Signed)
PROGRESS NOTE    Judy Chang  KVQ:[id number] DOB: 1950-03-21 DOA: 02/07/2019 PCP: Judy Frees, MD   Brief Narrative:  The patient is a 69 year old African-American female with a past medical history significant for but not limited to coronary artery disease, hypertension, hyperlipidemia, spinal stenosis requiring daily oxycodone as well as other comorbidities who presented with a working diagnosis of Judy Chang hypertensive crisis associated with status of blood practice on 02/07/2019.  LTM was started and she was loaded with Keppra received 40 mg of lorazepam and started on Cleviprex drip and then.  Critical care admitted the patient and her ASA level was negative as was her acetaminophen level.  Neurology was consulted for further evaluation recommendations.  She was started on AEDs and nephrology recommending weaning AEDs in 1 month and arrange outpatient patient follow-up on discharge.  She is weaned off of Cleviprex drip and put back on her home medications and transferred to [facility] service on 02/11/2018  Assessment & Plan:   Active Problems:   Status epilepticus ([place])  Hypertensive Emergency  -Off of the Cleviprex drip and Precedex drip -Continue with clonidine patch 0.2 mg transdermally every 7 days, amlodipine 10 mg p.o. daily, losartan 50 mg p.o. daily, coreg 12.5 mg p.o. twice daily goal for SBP < 140 -PRN hydralazine  Status epilepticus in setting of PRES and HTN emergency  -She was started on AEDS per neurology.  Currently on levetiracetam 500 mg every 12 and phenytoin 100 mg p.o. 3 times daily -Neurology recommended team recommends weaning AEDS in one month and will arrange outpatient follow-up when discharged - goal SBP < 643  Acute metabolic encephalopathy/Hypertensive encephalopathy- PRES/postictal and polypharmacy from a myriad of of home chronic pain/anxiety meds -Off precedex since yesterday -Restarted on home meds in case there is element of withdraw: lyrica and  fluoxetine; would prefer to avoid resuming tramadol -Seroquel BID. QTc reviewed and appropriate -We will monitor and she did appear little bit slower today but will -SLP recommended continuing current diet -We will get PT and OT to evaluate and treat  Hypokalemia - Replete and improved to 3.7 -Continue monitor and replete as necessary next-repeat CMP in a.m.  Hypomagnesemia The patient mag level was 1.5 -Replete with IV mag sulfate 2 g  -Continue to monitor and replete as necessary -Repeat magnesium in a.m.  Hypophosphatemia -Patient's phosphorus level was 2.1 -Replete with  K-Phos Neutral 500 mg p.o. twice daily x2 doses -Continue to monitor and replete as necessary  -Repeat phosphorus level in the a.m.  CKD stage III -Her BUN/creatinine went from 15/125 is now 24/1.52  -continue monitor urine output -Her home medications have been resumed and will continue losartan 51 p.o. daily -Avoid further nephrotoxic medications, contrast dyes, hypotension and renally dose medications-repeat CMP in a.m.  Polycythemia/erythrocytosis -Is improving as patient's hemoglobin/hematocrit went from 18.6/53.7 is now 17.7/52.1 -Continue monitor and trend and repeat CBC in a.m.  Hypoglycemia - improved - Discontinued D5gtt -CBGs have now been ranging from 75-1 45 - Healthy heart diet  DVT prophylaxis: Heparin 5,000 units sq q8h  Code Status: FULL CODE  Family Communication: No family present at bedside Disposition Plan: Was transferred to [address] from the intensive care unit  Consultants:   Neurology    Procedures:  EEG EEG 1/14 (initial): focal status epilepticus arising from right temporoparietal region.Additionally there is evidence of severe diffuse encephalopathy 1/14 after lorazepam and anticonvulsant load: Sharp waves seen in right frontotemporal region, maximal F8, No seizures noted 1/14 MRI brain: Abnormal  cortical and subcortical edema in the occipital and posterior parietal  brain bilaterally, with involvement also of the splenium of the corpus callosum. Patchy/speckled contrast enhancement. Think the findings are most consistent with posterior reversible encephalopathy in the subacute phase. See above discussion. 1/14 CT brain and neck: No acute finding. Negative perfusion evaluation.No large or medium vessel occlusion.Atherosclerotic disease at both carotid bifurcations but without stenosis. Atherosclerotic disease in both carotid siphon regions but without narrowing greater than 30%.   Antimicrobials:  Anti-infectives (From admission, onward)   None     Subjective: Seen and examined at bedside in the intensive care unit and she was doing a little bit better but was complaining of left arm pain her IV site was.  No nausea or vomiting.  Felt okay but fatigued.  No other concerns or complaints at this time.  Objective: Vitals:   02/12/19 0600 02/12/19 0700 02/12/19 0743 02/12/19 0747  BP: (!) 164/92 (!) 153/114 (!) 177/85   Pulse: 65 62  69  Resp: 19 18    Temp:      TempSrc:      SpO2: 100% 96%    Weight:      Height:        Intake/Output Summary (Last 24 hours) at 02/12/2019 0804 Last data filed at 02/12/2019 0640 Gross per 24 hour  Intake 681.5 ml  Output 1150 ml  Net -468.5 ml   Filed Weights   02/10/19 0500 02/11/19 0500 02/12/19 0500  Weight: 49.9 kg 49.6 kg 49.7 kg   Examination: Physical Exam:  Constitutional: African-American female currently in NAD and appears calm  Eyes: Lids and conjunctivae normal, sclerae anicteric  ENMT: External Ears, Nose appear normal. Grossly normal hearing. .  Neck: Appears normal, supple, no cervical masses, normal ROM, no appreciable thyromegaly; no JVD Respiratory: Diminished to auscultation bilaterally, no wheezing, rales, rhonchi or crackles. Normal respiratory effort and patient is not tachypenic. No accessory muscle use.  Unlabored breathing Cardiovascular: RRR, no murmurs / rubs / gallops. S1  and S2 auscultated.  Trace extremity edema.  Abdomen: Soft, non-tender, non-distended. Bowel sounds positive.  GU: Deferred. Musculoskeletal: No clubbing / cyanosis of digits/nails. No joint deformity upper and lower extremities.  Skin: No rashes, lesions, ulcers on limited skin evaluation and left arm with bandage. No induration; Warm and dry.  Neurologic: CN 2-12 grossly intact with no focal deficits. Romberg sign cerebellar reflexes not assessed.  Psychiatric: Normal judgment and insight. Alert and oriented x 3. Normal mood and appropriate affect.   Data Reviewed: I have personally reviewed following labs and imaging studies  CBC: Recent Labs  Lab 02/07/19 0744 02/07/19 1553 02/08/19 0056 02/09/19 0342 02/11/19 0721  WBC 10.6*  --  16.2* 10.0 9.3  NEUTROABS 7.3  --   --   --   --   HGB 21.6* 21.4* 18.0* 16.0* 18.6*  HCT 64.4* 63.0* 52.3* 48.2* 53.7*  MCV 87.5  --  84.5 87.0 84.6  PLT 235  --  224 220 761   Basic Metabolic Panel: Recent Labs  Lab 02/07/19 0744 02/07/19 0858 02/07/19 1553 02/08/19 0056 02/09/19 0342 02/11/19 0721  NA 137  --  137 135 140 139  K 3.4*  --  3.3* 3.8 3.8 3.1*  CL 102  --   --  105 111 106  CO2 19*  --   --  21* 21* 20*  GLUCOSE 199*  --   --  132* 125* 77  BUN 12  --   --  '18 21 15  ' CREATININE 1.34*  --   --  1.61* 1.58* 1.35*  CALCIUM 10.4*  --   --  8.6* 8.5* 8.8*  MG  --  1.5*  --  2.1  --   --   PHOS  --  3.3  --  4.0  --   --    GFR: Estimated Creatinine Clearance: 29.3 mL/min (A) (by C-G formula based on SCr of 1.35 mg/dL (H)). Liver Function Tests: Recent Labs  Lab 02/07/19 0744  AST 25  ALT 16  ALKPHOS 109  BILITOT 1.0  PROT 8.2*  ALBUMIN 3.5   No results for input(s): LIPASE, AMYLASE in the last 168 hours. Recent Labs  Lab 02/08/19 0714  AMMONIA <9*   Coagulation Profile: No results for input(s): INR, PROTIME in the last 168 hours. Cardiac Enzymes: No results for input(s): CKTOTAL, CKMB, CKMBINDEX, TROPONINI in  the last 168 hours. BNP (last 3 results) No results for input(s): PROBNP in the last 8760 hours. HbA1C: No results for input(s): HGBA1C in the last 72 hours. CBG: Recent Labs  Lab 02/11/19 2337 02/11/19 2353 02/12/19 0023 02/12/19 0341 02/12/19 0733  GLUCAP 66* 57* 145* 83 92   Lipid Profile: No results for input(s): CHOL, HDL, LDLCALC, TRIG, CHOLHDL, LDLDIRECT in the last 72 hours. Thyroid Function Tests: No results for input(s): TSH, T4TOTAL, FREET4, T3FREE, THYROIDAB in the last 72 hours. Anemia Panel: No results for input(s): VITAMINB12, FOLATE, FERRITIN, TIBC, IRON, RETICCTPCT in the last 72 hours. Sepsis Labs: Recent Labs  Lab 02/08/19 0047  LATICACIDVEN 1.3    Recent Results (from the past 240 hour(s))  Respiratory Panel by RT PCR (Flu A&B, Covid) - Nasopharyngeal Swab     Status: None   Collection Time: 02/07/19  8:05 AM   Specimen: Nasopharyngeal Swab  Result Value Ref Range Status   SARS Coronavirus 2 by RT PCR NEGATIVE NEGATIVE Final    Comment: (NOTE) SARS-CoV-2 target nucleic acids are NOT DETECTED. The SARS-CoV-2 RNA is generally detectable in upper respiratoy specimens during the acute phase of infection. The lowest concentration of SARS-CoV-2 viral copies this assay can detect is 131 copies/mL. A negative result does not preclude SARS-Cov-2 infection and should not be used as the sole basis for treatment or other patient management decisions. A negative result may occur with  improper specimen collection/handling, submission of specimen other than nasopharyngeal swab, presence of viral mutation(s) within the areas targeted by this assay, and inadequate number of viral copies (<131 copies/mL). A negative result must be combined with clinical observations, patient history, and epidemiological information. The expected result is Negative. Fact Sheet for Patients:  PinkCheek.be Fact Sheet for Healthcare Providers:   GravelBags.it This test is not yet ap proved or cleared by the [place] FDA and  has been authorized for detection and/or diagnosis of SARS-CoV-2 by FDA under an Emergency Use Authorization (EUA). This EUA will remain  in effect (meaning this test can be used) for the duration of the COVID-19 declaration under Section 564(b)(1) of the Act, 21 U.S.C. section 360bbb-3(b)(1), unless the authorization is terminated or revoked sooner.    Influenza A by PCR NEGATIVE NEGATIVE Final   Influenza B by PCR NEGATIVE NEGATIVE Final    Comment: (NOTE) The Xpert Xpress SARS-CoV-2/FLU/RSV assay is intended as an aid in  the diagnosis of influenza from Nasopharyngeal swab specimens and  should not be used as a sole basis for treatment. Nasal washings and  aspirates are unacceptable for Xpert Xpress SARS-CoV-2/FLU/RSV  testing. Fact Sheet for Patients: PinkCheek.be Fact Sheet for Healthcare Providers: GravelBags.it This test is not yet approved or cleared by the [place] FDA and  has been authorized for detection and/or diagnosis of SARS-CoV-2 by  FDA under an Emergency Use Authorization (EUA). This EUA will remain  in effect (meaning this test can be used) for the duration of the  Covid-19 declaration under Section 564(b)(1) of the Act, 21  U.S.C. section 360bbb-3(b)(1), unless the authorization is  terminated or revoked. Performed at [facility], [place] [address], [place], [place] [address]   MRSA PCR Screening     Status: None   Collection Time: 02/07/19  8:12 PM   Specimen: Nasopharyngeal  Result Value Ref Range Status   MRSA by PCR NEGATIVE NEGATIVE Final    Comment:        The GeneXpert MRSA Assay (FDA approved for NASAL specimens only), is one component of a comprehensive MRSA colonization surveillance program. It is not intended to diagnose MRSA infection nor to guide or monitor  treatment for MRSA infections. Performed at [facility], [place] [address], [place], [place] [address]     Radiology Studies: No results found.  Scheduled Meds: . amLODipine  10 mg Oral Daily  . carvedilol  12.5 mg Oral BID WC  . Chlorhexidine Gluconate Cloth  6 each Topical Daily  . cloNIDine  0.2 mg Transdermal Weekly  . FLUoxetine  20 mg Oral Daily  . heparin  5,000 Units Subcutaneous Q8H  . insulin aspart  0-6 Units Subcutaneous Q4H  . losartan  50 mg Oral Daily  . phenytoin  100 mg Oral TID  . pregabalin  75 mg Oral BID  . QUEtiapine  50 mg Oral BID   Continuous Infusions: . sodium chloride 250 mL (02/11/19 2153)  . levETIRAcetam 500 mg (02/12/19 0800)    LOS: 5 days   Kerney Elbe, DO [facility] PAGER is on [place]  If 7PM-7AM, please contact night-coverage [email or web address]

## 2019-02-13 DIAGNOSIS — I10 Essential (primary) hypertension: Secondary | ICD-10-CM

## 2019-02-13 LAB — CBC WITH DIFFERENTIAL/PLATELET
Abs Immature Granulocytes: 0.03 10*3/uL (ref 0.00–0.07)
Basophils Absolute: 0.1 10*3/uL (ref 0.0–0.1)
Basophils Relative: 1 %
Eosinophils Absolute: 0.2 10*3/uL (ref 0.0–0.5)
Eosinophils Relative: 2 %
HCT: 47.6 % — ABNORMAL HIGH (ref 36.0–46.0)
Hemoglobin: 15.7 g/dL — ABNORMAL HIGH (ref 12.0–15.0)
Immature Granulocytes: 0 %
Lymphocytes Relative: 21 %
Lymphs Abs: 2 10*3/uL (ref 0.7–4.0)
MCH: 28.8 pg (ref 26.0–34.0)
MCHC: 33 g/dL (ref 30.0–36.0)
MCV: 87.3 fL (ref 80.0–100.0)
Monocytes Absolute: 1.4 10*3/uL — ABNORMAL HIGH (ref 0.1–1.0)
Monocytes Relative: 14 %
Neutro Abs: 6 10*3/uL (ref 1.7–7.7)
Neutrophils Relative %: 62 %
Platelets: 183 10*3/uL (ref 150–400)
RBC: 5.45 MIL/uL — ABNORMAL HIGH (ref 3.87–5.11)
RDW: 17.3 % — ABNORMAL HIGH (ref 11.5–15.5)
WBC: 9.7 10*3/uL (ref 4.0–10.5)
nRBC: 0 % (ref 0.0–0.2)

## 2019-02-13 LAB — COMPREHENSIVE METABOLIC PANEL
ALT: 16 U/L (ref 0–44)
AST: 22 U/L (ref 15–41)
Albumin: 2.5 g/dL — ABNORMAL LOW (ref 3.5–5.0)
Alkaline Phosphatase: 71 U/L (ref 38–126)
Anion gap: 7 (ref 5–15)
BUN: 21 mg/dL (ref 8–23)
CO2: 25 mmol/L (ref 22–32)
Calcium: 8.8 mg/dL — ABNORMAL LOW (ref 8.9–10.3)
Chloride: 109 mmol/L (ref 98–111)
Creatinine, Ser: 1.34 mg/dL — ABNORMAL HIGH (ref 0.44–1.00)
GFR calc Af Amer: 46 mL/min — ABNORMAL LOW (ref >60)
GFR calc non Af Amer: 40 mL/min — ABNORMAL LOW (ref >60)
Glucose, Bld: 60 mg/dL — ABNORMAL LOW (ref 70–99)
Potassium: 3.4 mmol/L — ABNORMAL LOW (ref 3.5–5.1)
Sodium: 141 mmol/L (ref 135–145)
Total Bilirubin: 0.4 mg/dL (ref 0.3–1.2)
Total Protein: 5.7 g/dL — ABNORMAL LOW (ref 6.5–8.1)

## 2019-02-13 LAB — GLUCOSE, CAPILLARY
Glucose-Capillary: 152 mg/dL — ABNORMAL HIGH (ref 70–99)
Glucose-Capillary: 67 mg/dL — ABNORMAL LOW (ref 70–99)
Glucose-Capillary: 91 mg/dL (ref 70–99)
Glucose-Capillary: 158 mg/dL — ABNORMAL HIGH (ref 70–99)
Glucose-Capillary: 186 mg/dL — ABNORMAL HIGH (ref 70–99)
Glucose-Capillary: 83 mg/dL (ref 70–99)
Glucose-Capillary: 158 mg/dL — ABNORMAL HIGH (ref 70–99)

## 2019-02-13 LAB — PHENYTOIN LEVEL, TOTAL: Phenytoin Lvl: 17.1 ug/mL (ref 10.0–20.0)

## 2019-02-13 LAB — PHOSPHORUS: Phosphorus: 2.7 mg/dL (ref 2.5–4.6)

## 2019-02-13 LAB — MAGNESIUM: Magnesium: 2 mg/dL (ref 1.7–2.4)

## 2019-02-13 MED ORDER — PHENYTOIN SODIUM EXTENDED 100 MG PO CAPS
100.0000 mg | ORAL_CAPSULE | Freq: Two times a day (BID) | ORAL | Status: DC
  Administered 2019-02-13 – 2019-02-17 (×9): 100 mg via ORAL
  Filled 2019-02-13 (×9): qty 1

## 2019-02-13 MED ORDER — INSULIN ASPART 100 UNIT/ML ~~LOC~~ SOLN
0.0000 [IU] | Freq: Three times a day (TID) | SUBCUTANEOUS | Status: DC
  Administered 2019-02-15: 0 [IU] via SUBCUTANEOUS
  Administered 2019-02-16 (×2): 1 [IU] via SUBCUTANEOUS

## 2019-02-13 MED ORDER — LEVETIRACETAM 500 MG PO TABS
500.0000 mg | ORAL_TABLET | Freq: Two times a day (BID) | ORAL | Status: DC
  Administered 2019-02-13 – 2019-02-17 (×8): 500 mg via ORAL
  Filled 2019-02-13 (×8): qty 1

## 2019-02-13 MED ORDER — POTASSIUM CHLORIDE CRYS ER 20 MEQ PO TBCR
20.0000 meq | EXTENDED_RELEASE_TABLET | Freq: Once | ORAL | Status: AC
  Administered 2019-02-13: 20 meq via ORAL
  Filled 2019-02-13: qty 1

## 2019-02-13 MED ORDER — ACETAMINOPHEN 325 MG PO TABS
650.0000 mg | ORAL_TABLET | Freq: Four times a day (QID) | ORAL | Status: DC | PRN
  Administered 2019-02-13 – 2019-02-17 (×5): 650 mg via ORAL
  Filled 2019-02-13 (×6): qty 2

## 2019-02-13 NOTE — Progress Notes (Signed)
[facility]  PROGRESS NOTE  Judy Chang ZOX:[id number] DOB: Sep 12, 1950 DOA: 02/07/2019 PCP: [name], MD   Brief HPI:   69 yr old African-American female with a past history of CAD, hypertension, hyperlipidemia, spinal stenosis requiring daily oxycodone as well as other, but is came with hypertensive crisis, PR ES with status epilepticus on 02/07/2019.  LTM was started and she was loaded with Keppra, received 40 mg of lorazepam and started on Cleviprex drip.  Patient was admitted under CCM service..  Neurology was consulted for further recommendations.  She was started on AEDs and nephrology recommended weaning AEDs in a month and arrange for outpatient follow-up on discharge.  She is weaned off Cleviprex drip and put back on home medications transferred to [facility] service on 02/11/2018.    Subjective   Patient seen and examined, denies any complaints.  No headache or blurred vision.  No chest pain.   Assessment/Plan:     1. Hypertensive emergency-patient is off Cleviprex drip.  Continue clonidine patch 0.2 mg transdermally every 7 days, amlodipine 10 mg daily, losartan 50 mg daily, Coreg 12.5 mg p.o. twice a day.  SBP goal less than 140.  Continue as needed hydralazine.  2. Status epilepticus in setting of PR ES and hypertensive emergency-patient was started on AEDs per neurology, currently on Keppra 500 mg every 12 hours, phenytoin 100 mg p.o. 3 times daily.  Neurology recommended weaning AEDs in 1 month and will arrange outpatient follow-up and discharge.  3. Acute metabolic/hypertensive encephalopathy-PR ES/postictal and polypharmacy from chronic pain/anxiety meds.  She was weaned off Precedex yesterday.  Restarted home medications as there was element of withdrawal.  Continue Lyrica, fluoxetine, Seroquel.  4. CKD stage III-creatinine is stable at 1.34, patient's home medications have been resumed.  Continue losartan.  5. Erythrocytosis-patient's H&H was 21.4/63.0 on  02/07/2019, now improved to 15.7/47.6.  Continue to monitor.  6. Hypomagnesemia-replete.  7. Hypokalemia-potassium is 3.4 today.  Will replace potassium.  Follow BMP in a.m.  8. Diabetes mellitus-patient is on very sensitive sliding scale.  CBG 3 times daily with meals.    SpO2: 91 % O2 Flow Rate (L/min): 4.5 L/min    Lab Results  Component Value Date   SARSCOV2NAA NEGATIVE 02/07/2019   [place] NEGATIVE 06/20/2018     CBG: Recent Labs  Lab 02/13/19 0336 02/13/19 0740 02/13/19 0819 02/13/19 0920 02/13/19 1130  GLUCAP 152* 67* 91 158* 186*    CBC: Recent Labs  Lab 02/07/19 0744 02/07/19 1553 02/08/19 0056 02/09/19 0342 02/11/19 0721 02/12/19 1552 02/13/19 0640  WBC 10.6*  --  16.2* 10.0 9.3 7.8 9.7  NEUTROABS 7.3  --   --   --   --  5.1 6.0  HGB 21.6*   < > 18.0* 16.0* 18.6* 17.7* 15.7*  HCT 64.4*   < > 52.3* 48.2* 53.7* 52.1* 47.6*  MCV 87.5  --  84.5 87.0 84.6 85.0 87.3  PLT 235  --  224 220 177 175 183   < > = values in this interval not displayed.    Basic Metabolic Panel: Recent Labs  Lab 02/07/19 0858 02/07/19 1553 02/08/19 0056 02/09/19 0342 02/11/19 0721 02/12/19 1552 02/13/19 0640  NA  --    < > 135 140 139 140 141  K  --    < > 3.8 3.8 3.1* 3.7 3.4*  CL  --   --  105 111 106 106 109  CO2  --   --  21* 21* 20* 24 25  GLUCOSE  --   --  132* 125* 77 109* 60*  BUN  --   --  18 21 15  24* 21  CREATININE  --   --  1.61* 1.58* 1.35* 1.52* 1.34*  CALCIUM  --   --  8.6* 8.5* 8.8* 9.2 8.8*  MG 1.5*  --  2.1  --   --  1.5* 2.0  PHOS 3.3  --  4.0  --   --  2.1* 2.7   < > = values in this interval not displayed.     Liver Function Tests: Recent Labs  Lab 02/07/19 0744 02/12/19 1552 02/13/19 0640  AST 25 22 22   ALT 16 16 16   ALKPHOS 109 77 71  BILITOT 1.0 0.7 0.4  PROT 8.2* 6.3* 5.7*  ALBUMIN 3.5 2.8* 2.5*        DVT prophylaxis: Heparin  Code Status: Full code  Family Communication: No family at bedside  Disposition Plan:  likely home when medically ready for discharge         Scheduled medications:  . amLODipine  10 mg Oral Daily  . carvedilol  12.5 mg Oral BID WC  . cloNIDine  0.2 mg Transdermal Weekly  . FLUoxetine  20 mg Oral Daily  . heparin  5,000 Units Subcutaneous Q8H  . insulin aspart  0-6 Units Subcutaneous TID WC  . levETIRAcetam  500 mg Oral Q12H  . losartan  50 mg Oral Daily  . phenytoin  100 mg Oral BID  . pregabalin  75 mg Oral BID  . QUEtiapine  50 mg Oral BID    Consultants:  Neurology  Procedures:  EEG EEG 1/14 (initial): focal status epilepticus arising from right temporoparietal region.Additionally there is evidence of severe diffuse encephalopathy 1/14 after lorazepam and anticonvulsant load: Sharp waves seen in right frontotemporal region, maximal F8, No seizures noted Antibiotics:   Anti-infectives (From admission, onward)   None       Objective   Vitals:   02/13/19 0425 02/13/19 0433 02/13/19 0826 02/13/19 1127  BP:   (!) 171/81 116/61  Pulse:   63 63  Resp:   16 18  Temp:   (!) 97.4 F (36.3 C) 97.9 F (36.6 C)  TempSrc:   Oral   SpO2:   100% 91%  Weight: 51.1 kg 52.6 kg    Height:        Intake/Output Summary (Last 24 hours) at 02/13/2019 1422 Last data filed at 02/13/2019 0344 Gross per 24 hour  Intake -  Output 325 ml  Net -325 ml    01/18 1901 - 01/20 0700 In: 409.4 [P.O.:400; I.V.:9.4] Out: 1225 [Urine:1225]  Filed Weights   02/12/19 0500 02/13/19 0425 02/13/19 0433  Weight: 49.7 kg 51.1 kg 52.6 kg    Physical Examination:    General-appears in no acute distress  Heart-S1-S2, regular, no murmur auscultated  Lungs-clear to auscultation bilaterally, no wheezing or crackles auscultated  Abdomen-soft, nontender, no organomegaly  Extremities-no edema in the lower extremities  Neuro-alert, oriented x3, no focal deficit noted    Data Reviewed:   Recent Results (from the past 240 hour(s))  Respiratory Panel by RT PCR (Flu  A&B, Covid) - Nasopharyngeal Swab     Status: None   Collection Time: 02/07/19  8:05 AM   Specimen: Nasopharyngeal Swab  Result Value Ref Range Status   SARS Coronavirus 2 by RT PCR NEGATIVE NEGATIVE Final    Comment: (NOTE) SARS-CoV-2 target nucleic acids are NOT DETECTED. The SARS-CoV-2 RNA  is generally detectable in upper respiratoy specimens during the acute phase of infection. The lowest concentration of SARS-CoV-2 viral copies this assay can detect is 131 copies/mL. A negative result does not preclude SARS-Cov-2 infection and should not be used as the sole basis for treatment or other patient management decisions. A negative result may occur with  improper specimen collection/handling, submission of specimen other than nasopharyngeal swab, presence of viral mutation(s) within the areas targeted by this assay, and inadequate number of viral copies (<131 copies/mL). A negative result must be combined with clinical observations, patient history, and epidemiological information. The expected result is Negative. Fact Sheet for Patients:  PinkCheek.be Fact Sheet for Healthcare Providers:  GravelBags.it This test is not yet ap proved or cleared by the [place] FDA and  has been authorized for detection and/or diagnosis of SARS-CoV-2 by FDA under an Emergency Use Authorization (EUA). This EUA will remain  in effect (meaning this test can be used) for the duration of the COVID-19 declaration under Section 564(b)(1) of the Act, 21 U.S.C. section 360bbb-3(b)(1), unless the authorization is terminated or revoked sooner.    Influenza A by PCR NEGATIVE NEGATIVE Final   Influenza B by PCR NEGATIVE NEGATIVE Final    Comment: (NOTE) The Xpert Xpress SARS-CoV-2/FLU/RSV assay is intended as an aid in  the diagnosis of influenza from Nasopharyngeal swab specimens and  should not be used as a sole basis for treatment. Nasal washings  and  aspirates are unacceptable for Xpert Xpress SARS-CoV-2/FLU/RSV  testing. Fact Sheet for Patients: PinkCheek.be Fact Sheet for Healthcare Providers: GravelBags.it This test is not yet approved or cleared by the [place] FDA and  has been authorized for detection and/or diagnosis of SARS-CoV-2 by  FDA under an Emergency Use Authorization (EUA). This EUA will remain  in effect (meaning this test can be used) for the duration of the  Covid-19 declaration under Section 564(b)(1) of the Act, 21  U.S.C. section 360bbb-3(b)(1), unless the authorization is  terminated or revoked. Performed at [facility], [place] [address] [place], [place] [address]   MRSA PCR Screening     Status: None   Collection Time: 02/07/19  8:12 PM   Specimen: Nasopharyngeal  Result Value Ref Range Status   MRSA by PCR NEGATIVE NEGATIVE Final    Comment:        The GeneXpert MRSA Assay (FDA approved for NASAL specimens only), is one component of a comprehensive MRSA colonization surveillance program. It is not intended to diagnose MRSA infection nor to guide or monitor treatment for MRSA infections. Performed at [facility], [place] [address], [place], [place] [address]     No results for input(s): LIPASE, AMYLASE in the last 168 hours. Recent Labs  Lab 02/08/19 0714  AMMONIA <9*      Admission status: Inpatient: Based on patients clinical presentation and evaluation of above clinical data, I have made determination that patient meets Inpatient criteria at this time.   Oswald Hillock   [facility] If 7PM-7AM, please contact night-coverage at [email or web address], Office  [phone]  password [email or web address]  02/13/2019, 2:22 PM  LOS: 6 days

## 2019-02-13 NOTE — Evaluation (Signed)
Physical Therapy Evaluation Patient Details Name: Judy Chang MRN: [id number] DOB: [birth year] Today's Date: 02/13/2019   History of Present Illness   69 year old female admitted with working diagnosis of PRES, hypertensive crisis and associated status epilepticus on 1/14. LTM started. Loaded w/ keppra. MRI on 1/14 reported: "Abnormal cortical and subcortical edema in the occipital and posterior parietal brain bilaterally, with involvement also of the splenium of the corpus callosum. Patchy/speckled contrast enhancement. 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 Prior to admission, pt lives with her husband, is independent with ADL's and uses a cane. On PT evaluation, pt presents with balance and cognitive impairments. Pt ambulating limited hallway distances with handheld assist; up to moderate assist provided for stability. SpO2 98% on RA, BP 130/70. Pt with decreased cognition including difficulty with problem solving and executive functioning. Pt unable to wayfind/navigate in order to find her room with max cues and when standing right in front of it. Pt presents as a high fall risk based on decreased gait speed and safety awareness. Would benefit from CIR to maximize functional independence and decrease caregiver burden.    Follow Up Recommendations Supervision/Assistance - 24 hour;CIR    Equipment Recommendations  Rolling walker with 5" wheels    Recommendations for Other Services       Precautions / Restrictions Precautions Precautions: Fall(seizure) Restrictions Weight Bearing Restrictions: No      Mobility  Bed Mobility Overal bed mobility: Needs Assistance Bed Mobility: Supine to Sit     Supine to sit: Min guard     General bed mobility comments: for safety  Transfers Overall transfer level: Needs assistance Equipment used: 2 person hand held  assist Transfers: Sit to/from Stand Sit to Stand: Min assist         General transfer comment: MinA to initially stabilize  Ambulation/Gait Ambulation/Gait assistance: Min assist;Mod assist;+2 safety/equipment Gait Distance (Feet): 75 Feet Assistive device: 2 person hand held assist Gait Pattern/deviations: Step-through pattern;Decreased stride length;Drifts right/left Gait velocity: decreased   General Gait Details: Pt requiring min-modA for stability, tendency for lateral drift  Stairs            Wheelchair Mobility    Modified Rankin (Stroke Patients Only)       Balance Overall balance assessment: Needs assistance Sitting-balance support: No upper extremity supported;Feet supported Sitting balance-Leahy Scale: Fair     Standing balance support: No upper extremity supported;During functional activity Standing balance-Leahy Scale: Poor Standing balance comment: fair static standing initially, pt ambulated in hallway without AD minA+2 needed for stability, pt staggering and unaware, pt required increased assistance with longer distance mobiltiy and reported dizziness requiring modA+2 BP 130/70 O2 98-100% RA                             Pertinent Vitals/Pain Pain Assessment: 0-10 Pain Score: 7  Pain Location: L hand/arm  Pain Descriptors / Indicators: Discomfort Pain Intervention(s): Monitored during session    Home Living Family/patient expects to be discharged to:: Private residence Living Arrangements: Spouse/significant other Available Help at Discharge: Family Type of Home: Apartment Home Access: Stairs to enter Entrance Stairs-Rails: Right Entrance Stairs-Number of Steps: 14 Home Layout: One level Home Equipment: Cane - single point;Shower seat      Prior Function Level of Independence: Independent with assistive device(s)         Comments: pt using  cane prior to admit     Hand Dominance   Dominant Hand: Right     Extremity/Trunk Assessment   Upper Extremity Assessment Upper Extremity Assessment: RUE deficits/detail;LUE deficits/detail RUE Deficits / Details: reports pain in hands:WFL LUE Deficits / Details: reports pain in hands;WFL    Lower Extremity Assessment Lower Extremity Assessment: Overall WFL for tasks assessed    Cervical / Trunk Assessment Cervical / Trunk Assessment: Normal  Communication   Communication: No difficulties  Cognition Arousal/Alertness: Awake/alert Behavior During Therapy: WFL for tasks assessed/performed Overall Cognitive Status: Impaired/Different from baseline Area of Impairment: Attention;Memory;Following commands;Safety/judgement;Awareness;Problem solving                   Current Attention Level: Sustained Memory: Decreased short-term memory Following Commands: Follows one step commands inconsistently;Follows one step commands with increased time Safety/Judgement: Decreased awareness of safety;Decreased awareness of deficits Awareness: Emergent Problem Solving: Slow processing;Difficulty sequencing;Requires verbal cues;Requires tactile cues General Comments: pt navigating in hallway, demonstrated limitations with short term memory and problem solving when finding her way back to her room, required totalA to correctly identify room number, pt consistently attempting to locate room number above room despite multiple cues and her locating room number correctly beside door;demonstrates decreased awareness of deficits and of safety       General Comments General comments (skin integrity, edema, etc.): pt on 4lnc upon arrival, removed O2, pt on RA throughout session, SpO2 98%-100%    Exercises     Assessment/Plan    PT Assessment Patient needs continued PT services  PT Problem List Decreased strength;Decreased activity tolerance;Decreased balance;Decreased mobility;Decreased coordination;Decreased cognition;Decreased safety awareness       PT  Treatment Interventions DME instruction;Gait training;Functional mobility training;Stair training;Therapeutic exercise;Therapeutic activities;Balance training;Patient/family education    PT Goals (Current goals can be found in the Care Plan section)  Acute Rehab PT Goals Patient Stated Goal: to go home PT Goal Formulation: With patient Time For Goal Achievement: 02/27/19 Potential to Achieve Goals: Good    Frequency Min 3X/week   Barriers to discharge        Co-evaluation PT/OT/SLP Co-Evaluation/Treatment: Yes Reason for Co-Treatment: Necessary to address cognition/behavior during functional activity   OT goals addressed during session: ADL's and self-care       AM-PAC PT "6 Clicks" Mobility  Outcome Measure Help needed turning from your back to your side while in a flat bed without using bedrails?: None Help needed moving from lying on your back to sitting on the side of a flat bed without using bedrails?: A Little Help needed moving to and from a bed to a chair (including a wheelchair)?: A Little Help needed standing up from a chair using your arms (e.g., wheelchair or bedside chair)?: A Little Help needed to walk in hospital room?: A Little Help needed climbing 3-5 steps with a railing? : A Lot 6 Click Score: 18    End of Session Equipment Utilized During Treatment: Gait belt Activity Tolerance: Patient tolerated treatment well Patient left: in chair;with call bell/phone within reach;with chair alarm set Nurse Communication: Mobility status PT Visit Diagnosis: Unsteadiness on feet (R26.81);Difficulty in walking, not elsewhere classified (R26.2)    Time: 7341-9379 PT Time Calculation (min) (ACUTE ONLY): 35 min   Charges:   PT Evaluation $PT Eval Moderate Complexity: 1 Mod          Ellamae Sia, [place], DPT Acute Rehabilitation Services Pager [phone] Office [phone]   Willy Eddy 02/13/2019, 1:24 PM

## 2019-02-13 NOTE — Progress Notes (Signed)
Rehab Admissions Coordinator Note:  Per PT and OT recommendation, this patient was screened by Raechel Ache for appropriateness for an Inpatient Acute Rehab Consult.  At this time, we are recommending Inpatient Rehab consult.   AC will place consult order in the chart to allow for further assessment of pt's candidacy for CIR.   Raechel Ache 02/13/2019, 2:25 PM  I can be reached at [phone].

## 2019-02-13 NOTE — Progress Notes (Signed)
Occupational Therapy Evaluation Patient Details Name: Judy Chang MRN: [id number] DOB: 03-23-1950 Today's Date: 02/13/2019    History of Present Illness  69 year old female admitted with working diagnosis of PRES, hypertensive crisis and associated status epilepticus on 1/14. LTM started. Loaded w/ keppra. MRI on 1/14 reported: "Abnormal cortical and subcortical edema in the occipital and posterior parietal brain bilaterally, with involvement also of the splenium of the corpus callosum. Patchy/speckled contrast enhancement. 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 Pt currently demonstrates executive functioning skill limitation when navigating in hallway and locating her room, she required cues to scan the environment, consistent cueing of correct room number, and assistance with problem solving use of room numbers to identify her room. She initially required minA+2 for functional mobility but required increased support modA+2 with further ambulation. She demonstrates decreased awareness of safety and decreased awareness of deficits. Upon returning to the room pt reported dizziness, her BP was 130/70 SpO2 RA 98%-100%. Due to decline in current level of function, pt would benefit from acute OT to address cognitive skills required to navigate environments, increasing safety awareness, and stability during functional mobility with ADL completion. At this time, recommend CIR follow-up. Will continue to follow acutely.     Follow Up Recommendations  CIR    Equipment Recommendations  3 in 1 bedside commode    Recommendations for Other Services       Precautions / Restrictions Precautions Precautions: Fall(seizure) Restrictions Weight Bearing Restrictions: No      Mobility  Bed Mobility Overal bed mobility: Needs Assistance Bed Mobility: Supine to Sit     Supine to sit: Min guard     General bed mobility comments: for safety  Transfers Overall transfer level: Needs assistance Equipment used: 2 person hand held assist Transfers: Sit to/from Stand Sit to Stand: Min assist;+2 safety/equipment;+2 physical assistance         General transfer comment: minA+2 for safety     Balance Overall balance assessment: Needs assistance Sitting-balance support: No upper extremity supported;Feet supported Sitting balance-Leahy Scale: Fair     Standing balance support: No upper extremity supported;During functional activity Standing balance-Leahy Scale: Poor Standing balance comment: fair static standing initially, pt ambulated in hallway without AD minA+2 needed for stability, pt staggering and unaware, pt required increased assistance with longer distance mobiltiy and reported dizziness requiring modA+2 BP 130/70 O2 98-100% RA                           ADL either performed or assessed with clinical judgement   ADL Overall ADL's : Needs assistance/impaired Eating/Feeding: Set up;Sitting   Grooming: Moderate assistance;Standing   Upper Body Bathing: Minimal assistance;Sitting   Lower Body Bathing: Moderate assistance;Sit to/from stand;Minimal assistance;+2 for physical assistance;+2 for safety/equipment   Upper Body Dressing : Minimal assistance;Sitting   Lower Body Dressing: Minimal assistance;+2 for safety/equipment;+2 for physical assistance   Toilet Transfer: Minimal assistance;+2 for safety/equipment;+2 for physical assistance;Moderate assistance Toilet Transfer Details (indicate cue type and reason): initially minA+2 with ambulation requiring modA+2 with further distance mobility Toileting- Clothing Manipulation and Hygiene: Minimal assistance;+2 for safety/equipment;+2 for physical assistance       Functional mobility during ADLs:  Minimal assistance;+2 for safety/equipment;+2 for physical assistance;Moderate assistance General ADL Comments: pt  limited by cognition, instability, weakness     Vision Patient Visual Report: No change from baseline Vision Assessment?: No apparent visual deficits Additional Comments: vision assessed WNL     Perception     Praxis      Pertinent Vitals/Pain Pain Assessment: 0-10 Pain Score: 7  Pain Location: L hand/arm  Pain Descriptors / Indicators: Discomfort Pain Intervention(s): Monitored during session     Hand Dominance Right   Extremity/Trunk Assessment Upper Extremity Assessment Upper Extremity Assessment: RUE deficits/detail;LUE deficits/detail RUE Deficits / Details: reports pain in hands:WFL LUE Deficits / Details: reports pain in hands;WFL   Lower Extremity Assessment Lower Extremity Assessment: Defer to PT evaluation   Cervical / Trunk Assessment Cervical / Trunk Assessment: Normal   Communication Communication Communication: No difficulties   Cognition Arousal/Alertness: Awake/alert Behavior During Therapy: WFL for tasks assessed/performed Overall Cognitive Status: Impaired/Different from baseline Area of Impairment: Attention;Memory;Following commands;Safety/judgement;Awareness;Problem solving                   Current Attention Level: Sustained Memory: Decreased short-term memory Following Commands: Follows one step commands inconsistently;Follows one step commands with increased time Safety/Judgement: Decreased awareness of safety;Decreased awareness of deficits Awareness: Emergent Problem Solving: Slow processing;Difficulty sequencing;Requires verbal cues;Requires tactile cues General Comments: pt navigating in hallway, demonstrated limitations with short term memory and problem solving when finding her way back to her room, required totalA to correctly identify room number, pt consistently attempting to locate room number above room despite  multiple cues and her locating room number correctly beside door;demonstrates decreased awareness of deficits and of safety    General Comments  pt on 4lnc upon arrival, removed O2, pt on RA throughout session, SpO2 98%-100%    Exercises     Shoulder Instructions      Home Living Family/patient expects to be discharged to:: Private residence Living Arrangements: Spouse/significant other Available Help at Discharge: Family Type of Home: Apartment Home Access: Stairs to enter Technical brewer of Steps: 14 Entrance Stairs-Rails: Right Home Layout: One level     Bathroom Shower/Tub: Occupational psychologist: [place] - single point;Shower seat          Prior Functioning/Environment Level of Independence: Independent with assistive device(s)        Comments: pt using cane prior to admit        OT Problem List: Decreased activity tolerance;Impaired balance (sitting and/or standing);Decreased cognition;Decreased coordination;Decreased safety awareness;Decreased knowledge of use of DME or AE;Decreased knowledge of precautions;Pain      OT Treatment/Interventions: Self-care/ADL training;Therapeutic exercise;Energy conservation;DME and/or AE instruction;Therapeutic activities;Cognitive remediation/compensation;Patient/family education;Balance training    OT Goals(Current goals can be found in the care plan section) Acute Rehab OT Goals Patient Stated Goal: to go home OT Goal Formulation: With patient Time For Goal Achievement: 02/27/19 Potential to Achieve Goals: Good ADL Goals Pt Will Perform Lower Body Dressing: with supervision;sit to/from stand Pt Will Transfer to Toilet: with supervision;ambulating Additional ADL Goal #1: Pt will navigate through environment locating room correctly with 2 verbal cues. Additional ADL Goal #2: Pt will complete medication management with supervision.  OT Frequency: Min 2X/week   Barriers to D/C:  Inaccessible home environment  pt has 14 stairs to get to apartment       Co-evaluation PT/OT/SLP Co-Evaluation/Treatment: Yes Reason for Co-Treatment: Necessary to address cognition/behavior during functional activity   OT goals addressed during session: ADL's and self-care      AM-PAC OT "6 Clicks"  Daily Activity     Outcome Measure Help from another person eating meals?: A Little Help from another person taking care of personal grooming?: A Lot Help from another person toileting, which includes using toliet, bedpan, or urinal?: A Lot Help from another person bathing (including washing, rinsing, drying)?: A Lot Help from another person to put on and taking off regular upper body clothing?: A Little Help from another person to put on and taking off regular lower body clothing?: A Lot 6 Click Score: 14   End of Session Equipment Utilized During Treatment: Gait belt Nurse Communication: Mobility status  Activity Tolerance: Patient tolerated treatment well Patient left: in chair;with chair alarm set;with call bell/phone within reach  OT Visit Diagnosis: Unsteadiness on feet (R26.81);Other abnormalities of gait and mobility (R26.89);Muscle weakness (generalized) (M62.81);History of falling (Z91.81);Other symptoms and signs involving cognitive function;Pain Pain - Right/Left: (bilateral ) Pain - part of body: Hand                Time: 1901-2224 OT Time Calculation (min): 38 min Charges:  OT General Charges $OT Visit: 1 Visit OT Evaluation $OT Eval Moderate Complexity: 1 Mod OT Treatments $Self Care/Home Management : 8-22 mins  Dorinda Hill OTR/L Acute Rehabilitation Services Office: [place] 02/13/2019, 1:08 PM

## 2019-02-13 NOTE — Progress Notes (Signed)
Inpatient Diabetes Program Recommendations  AACE/ADA: New Consensus Statement on Inpatient Glycemic Control (2015)  Target Ranges:  Prepandial:   less than 140 mg/dL      Peak postprandial:   less than 180 mg/dL (1-2 hours)      Critically ill patients:  140 - 180 mg/dL   Lab Results  Component Value Date   GLUCAP 186 (H) 02/13/2019   HGBA1C 5.2 02/08/2019    Review of Glycemic Control Results for Judy Chang, Judy Chang (MRN [id number]) as of 02/13/2019 14:03  Ref. Range 02/13/2019 03:36 02/13/2019 07:40 02/13/2019 08:19 02/13/2019 09:20 02/13/2019 11:30  Glucose-Capillary Latest Ref Range: 70 - 99 mg/dL 152 (H) 67 (L) 91 158 (H) 186 (H)   Current orders for Inpatient glycemic control:  0-6 units q 4 hours  Inpatient Diabetes Program Recommendations:    Consider changing Novolog correction to tid with meals.   Thanks  Adah Perl, RN, BC-ADM Inpatient Diabetes Coordinator Pager [phone] (8a-5p)

## 2019-02-14 ENCOUNTER — Inpatient Hospital Stay (HOSPITAL_COMMUNITY): Payer: Medicare Other

## 2019-02-14 LAB — GLUCOSE, CAPILLARY
Glucose-Capillary: 102 mg/dL — ABNORMAL HIGH (ref 70–99)
Glucose-Capillary: 110 mg/dL — ABNORMAL HIGH (ref 70–99)
Glucose-Capillary: 115 mg/dL — ABNORMAL HIGH (ref 70–99)
Glucose-Capillary: 126 mg/dL — ABNORMAL HIGH (ref 70–99)
Glucose-Capillary: 139 mg/dL — ABNORMAL HIGH (ref 70–99)
Glucose-Capillary: 82 mg/dL (ref 70–99)
Glucose-Capillary: 87 mg/dL (ref 70–99)

## 2019-02-14 LAB — BASIC METABOLIC PANEL
Anion gap: 10 (ref 5–15)
BUN: 19 mg/dL (ref 8–23)
CO2: 25 mmol/L (ref 22–32)
Calcium: 8.9 mg/dL (ref 8.9–10.3)
Chloride: 109 mmol/L (ref 98–111)
Creatinine, Ser: 1.34 mg/dL — ABNORMAL HIGH (ref 0.44–1.00)
GFR calc Af Amer: 46 mL/min — ABNORMAL LOW (ref >60)
GFR calc non Af Amer: 40 mL/min — ABNORMAL LOW (ref >60)
Glucose, Bld: 82 mg/dL (ref 70–99)
Potassium: 3.7 mmol/L (ref 3.5–5.1)
Sodium: 144 mmol/L (ref 135–145)

## 2019-02-14 MED ORDER — OXYCODONE HCL 5 MG PO TABS
5.0000 mg | ORAL_TABLET | Freq: Four times a day (QID) | ORAL | Status: DC | PRN
  Administered 2019-02-14 – 2019-02-17 (×11): 5 mg via ORAL
  Filled 2019-02-14 (×10): qty 1

## 2019-02-14 MED ORDER — OXYCODONE HCL 5 MG PO TABS
5.0000 mg | ORAL_TABLET | Freq: Once | ORAL | Status: AC
  Filled 2019-02-14: qty 1

## 2019-02-14 NOTE — Progress Notes (Signed)
Physical Therapy Treatment Patient Details Name: Judy Chang MRN: [id number] DOB: 1950-02-23 Today's Date: 02/14/2019    History of Present Illness  68 year old female admitted with working diagnosis of PRES, hypertensive crisis and associated status epilepticus on 1/14. LTM started. Loaded w/ keppra. MRI on 1/14 reported: "Abnormal cortical and subcortical edema in the occipital and posterior parietal brain bilaterally, with involvement also of the splenium of the corpus callosum. Patchy/speckled contrast enhancement. Think the findings are most consistent with posterior reversible encephalopathy in the subacute phase."     PT Comments    Pt progressing towards goals, however, remains unsteady and demonstrates cognitive deficits. Pt unable to find room, even with cues during session. Required min to mod A for gait without AD and had LOB X1. Continue to feel CIR is most appropriate d/c plan, however, if pt decides to go home, will benefit from HHPT and 24/7.  Will also benefit from use of RW to increase safety. Will continue to follow acutely to maximize functional mobility independence and safety.   Follow Up Recommendations  Supervision/Assistance - 24 hour;CIR     Equipment Recommendations  Rolling walker with 5" wheels    Recommendations for Other Services       Precautions / Restrictions Precautions Precautions: Fall;Other (comment) Precaution Comments: seizure Restrictions Weight Bearing Restrictions: No    Mobility  Bed Mobility Overal bed mobility: Needs Assistance Bed Mobility: Supine to Sit;Sit to Supine     Supine to sit: Supervision Sit to supine: Supervision   General bed mobility comments: Supervision for safety.  Transfers Overall transfer level: Needs assistance Equipment used: 1 person hand held assist;Rolling walker (2 wheeled) Transfers: Sit to/from Stand Sit to Stand: Min guard         General transfer comment: Min guard for steadying.  Required multiple cues for safe hand placement when performing transfer with RW.   Ambulation/Gait Ambulation/Gait assistance: Min assist;Mod assist Gait Distance (Feet): 125 [id number]) Assistive device: 1 person hand held assist;Rolling walker (2 wheeled) Gait Pattern/deviations: Step-through pattern;Decreased stride length;Drifts right/left Gait velocity: Decreased   General Gait Details: Easily distracted during gait. Required cues for safety. 1 LOB requiring mod A for stability. Min A for steadying. Attempted gait with RW and pt requiring continuous cues to remain within RW, especially when turning.    Stairs Stairs: Yes Stairs assistance: Min assist Stair Management: Two rails;One rail Right;Alternating pattern;Forwards Number of Stairs: 6 General stair comments: Performed stair navigation with min A for steadying. Required multiple cues to only use one rail, as that is what pt has at home. Pt continued using both rails.    Wheelchair Mobility    Modified Rankin (Stroke Patients Only)       Balance Overall balance assessment: Needs assistance Sitting-balance support: No upper extremity supported;Feet supported Sitting balance-Leahy Scale: Fair     Standing balance support: No upper extremity supported;During functional activity;Bilateral upper extremity supported Standing balance-Leahy Scale: Poor Standing balance comment: Reliant on UE support and external support during dynamic gait.                             Cognition Arousal/Alertness: Awake/alert Behavior During Therapy: WFL for tasks assessed/performed Overall Cognitive Status: Impaired/Different from baseline Area of Impairment: Attention;Memory;Following commands;Safety/judgement;Awareness;Problem solving                   Current Attention Level: Sustained Memory: Decreased short-term memory Following Commands: Follows one step commands inconsistently;Follows one  step commands with  increased time Safety/Judgement: Decreased awareness of safety;Decreased awareness of deficits Awareness: Emergent Problem Solving: Slow processing;Difficulty sequencing;Requires verbal cues;Requires tactile cues General Comments: Pt continues to demonstrate short term memory deficits and is unaware of current deficits. Pt required total A to find room. Was able to remember room number, but could not find her room despite max cues. Walked past her room multiple times when attempting to locate her room.       Exercises      General Comments        Pertinent Vitals/Pain Pain Assessment: Faces Faces Pain Scale: Hurts a little bit Pain Location: L hand/arm  Pain Descriptors / Indicators: Grimacing;Guarding Pain Intervention(s): Monitored during session;Limited activity within patient's tolerance;Repositioned    Home Living                      Prior Function            PT Goals (current goals can now be found in the care plan section) Acute Rehab PT Goals Patient Stated Goal: to go home PT Goal Formulation: With patient Time For Goal Achievement: 02/27/19 Potential to Achieve Goals: Good Progress towards PT goals: Progressing toward goals    Frequency    Min 3X/week      PT Plan Current plan remains appropriate    Co-evaluation              AM-PAC PT "6 Clicks" Mobility   Outcome Measure  Help needed turning from your back to your side while in a flat bed without using bedrails?: None Help needed moving from lying on your back to sitting on the side of a flat bed without using bedrails?: None Help needed moving to and from a bed to a chair (including a wheelchair)?: A Little Help needed standing up from a chair using your arms (e.g., wheelchair or bedside chair)?: A Little Help needed to walk in hospital room?: A Lot Help needed climbing 3-5 steps with a railing? : A Little 6 Click Score: 19    End of Session Equipment Utilized During Treatment:  Gait belt Activity Tolerance: Patient tolerated treatment well Patient left: in bed;with call bell/phone within reach;with bed alarm set Nurse Communication: Mobility status PT Visit Diagnosis: Unsteadiness on feet (R26.81);Difficulty in walking, not elsewhere classified (R26.2)     Time: 1583-0940 PT Time Calculation (min) (ACUTE ONLY): 30 min  Charges:  $Gait Training: 23-37 mins                     Lou Miner, DPT  Acute Rehabilitation Services  Pager: [phone] Office: [phone]    Rudean Hitt 02/14/2019, 6:20 PM

## 2019-02-14 NOTE — Plan of Care (Signed)

## 2019-02-14 NOTE — Progress Notes (Addendum)
[facility]  PROGRESS NOTE  RHIANNA Chang RDE:[id number] DOB: 1950-06-27 DOA: 02/07/2019 PCP: [name], MD   Brief HPI:   69 yr old African-American female with a past history of CAD, hypertension, hyperlipidemia, spinal stenosis requiring daily oxycodone as well as other, but is came with hypertensive crisis, PR ES with status epilepticus on 02/07/2019.  LTM was started and she was loaded with Keppra, received 40 mg of lorazepam and started on Cleviprex drip.  Patient was admitted under CCM service..  Neurology was consulted for further recommendations.  She was started on AEDs and nephrology recommended weaning AEDs in a month and arrange for outpatient follow-up on discharge.  She is weaned off Cleviprex drip and put back on home medications transferred to [facility] service on 02/11/2018.    Subjective   Patient seen and examined, denies chest pain or shortness of breath.   Assessment/Plan:     1. Hypertensive emergency-resolved, patient is off Cleviprex drip.  Continue clonidine patch 0.2 mg transdermally every 7 days, amlodipine 10 mg daily, losartan 50 mg daily, Coreg 12.5 mg p.o. twice a day.  SBP goal less than 140.  Continue as needed hydralazine.  2. Status epilepticus in setting of PR ES and hypertensive emergency-patient was started on AEDs per neurology, currently on Keppra 500 mg every 12 hours, phenytoin 100 mg p.o. 3 times daily.  Neurology recommended weaning AEDs in 1 month and will arrange outpatient follow-up and discharge.  3. Acute metabolic/hypertensive encephalopathy-PR ES/postictal and polypharmacy from chronic pain/anxiety meds.  She was weaned off Precedex yesterday.  Restarted home medications as there was element of withdrawal.  Continue Lyrica, fluoxetine, Seroquel.  4. CKD stage III-creatinine is stable at 1.34, patient's home medications have been resumed.  Continue losartan.  5. Erythrocytosis-patient's H&H was 21.4/63.0 on 02/07/2019, now improved  to 15.7/47.6.  Continue to monitor.  6. Hypomagnesemia-replete.  7. Hypokalemia-replete  8. Diabetes mellitus-patient is on very sensitive sliding scale.  CBG 3 times daily with meals.    SpO2: 100 % O2 Flow Rate (L/min): 4.5 L/min    Lab Results  Component Value Date   SARSCOV2NAA NEGATIVE 02/07/2019   [place] NEGATIVE 06/20/2018     CBG: Recent Labs  Lab 02/13/19 2025 02/14/19 0010 02/14/19 0346 02/14/19 0745 02/14/19 1105  GLUCAP 158* 110* 87 82 139*    CBC: Recent Labs  Lab 02/08/19 0056 02/09/19 0342 02/11/19 0721 02/12/19 1552 02/13/19 0640  WBC 16.2* 10.0 9.3 7.8 9.7  NEUTROABS  --   --   --  5.1 6.0  HGB 18.0* 16.0* 18.6* 17.7* 15.7*  HCT 52.3* 48.2* 53.7* 52.1* 47.6*  MCV 84.5 87.0 84.6 85.0 87.3  PLT 224 220 177 175 631    Basic Metabolic Panel: Recent Labs  Lab 02/08/19 0056 02/08/19 0056 02/09/19 0342 02/11/19 0721 02/12/19 1552 02/13/19 0640 02/14/19 0412  NA 135   < > 140 139 140 141 144  K 3.8   < > 3.8 3.1* 3.7 3.4* 3.7  CL 105   < > 111 106 106 109 109  CO2 21*   < > 21* 20* 24 25 25   GLUCOSE 132*   < > 125* 77 109* 60* 82  BUN 18   < > 21 15 24* 21 19  CREATININE 1.61*   < > 1.58* 1.35* 1.52* 1.34* 1.34*  CALCIUM 8.6*   < > 8.5* 8.8* 9.2 8.8* 8.9  MG 2.1  --   --   --  1.5* 2.0  --  PHOS 4.0  --   --   --  2.1* 2.7  --    < > = values in this interval not displayed.     Liver Function Tests: Recent Labs  Lab 02/12/19 1552 02/13/19 0640  AST 22 22  ALT 16 16  ALKPHOS 77 71  BILITOT 0.7 0.4  PROT 6.3* 5.7*  ALBUMIN 2.8* 2.5*        DVT prophylaxis: Heparin  Code Status: Full code  Family Communication: Discussed with husband on phone.  Disposition Plan: CIR         Scheduled medications:  . amLODipine  10 mg Oral Daily  . carvedilol  12.5 mg Oral BID WC  . cloNIDine  0.2 mg Transdermal Weekly  . FLUoxetine  20 mg Oral Daily  . heparin  5,000 Units Subcutaneous Q8H  . insulin aspart  0-6  Units Subcutaneous TID WC  . levETIRAcetam  500 mg Oral Q12H  . losartan  50 mg Oral Daily  . phenytoin  100 mg Oral BID  . pregabalin  75 mg Oral BID  . QUEtiapine  50 mg Oral BID    Consultants:  Neurology  Procedures:  EEG EEG 1/14 (initial): focal status epilepticus arising from right temporoparietal region.Additionally there is evidence of severe diffuse encephalopathy 1/14 after lorazepam and anticonvulsant load: Sharp waves seen in right frontotemporal region, maximal F8, No seizures noted Antibiotics:   Anti-infectives (From admission, onward)   None       Objective   Vitals:   02/14/19 0011 02/14/19 0347 02/14/19 0747 02/14/19 1106  BP: (!) 147/94 (!) 143/64 (!) 165/78 136/75  Pulse: 69 65 68 72  Resp: 16 17 12 19   Temp: 98 F (36.7 C) 98.2 F (36.8 C) 98.2 F (36.8 C) 98.7 F (37.1 C)  TempSrc: Oral Oral  Oral  SpO2: 97% 91%  100%  Weight:  51 kg    Height:       No intake or output data in the 24 hours ending 02/14/19 1224  01/19 1901 - 01/21 0700 In: -  Out: 325 [Urine:325]  Filed Weights   02/13/19 0425 02/13/19 0433 02/14/19 0347  Weight: 51.1 kg 52.6 kg 51 kg    Physical Examination:   General-appears in no acute distress Heart-S1-S2, regular, no murmur auscultated Lungs-clear to auscultation bilaterally, no wheezing or crackles auscultated Abdomen-soft, nontender, no organomegaly Extremities-no edema in the lower extremities Neuro-alert, oriented x3, no focal deficit noted   Data Reviewed:   Recent Results (from the past 240 hour(s))  Respiratory Panel by RT PCR (Flu A&B, Covid) - Nasopharyngeal Swab     Status: None   Collection Time: 02/07/19  8:05 AM   Specimen: Nasopharyngeal Swab  Result Value Ref Range Status   SARS Coronavirus 2 by RT PCR NEGATIVE NEGATIVE Final    Comment: (NOTE) SARS-CoV-2 target nucleic acids are NOT DETECTED. The SARS-CoV-2 RNA is generally detectable in upper respiratoy specimens during the acute  phase of infection. The lowest concentration of SARS-CoV-2 viral copies this assay can detect is 131 copies/mL. A negative result does not preclude SARS-Cov-2 infection and should not be used as the sole basis for treatment or other patient management decisions. A negative result may occur with  improper specimen collection/handling, submission of specimen other than nasopharyngeal swab, presence of viral mutation(s) within the areas targeted by this assay, and inadequate number of viral copies (<131 copies/mL). A negative result must be combined with clinical observations, patient history, and epidemiological  information. The expected result is Negative. Fact Sheet for Patients:  PinkCheek.be Fact Sheet for Healthcare Providers:  GravelBags.it This test is not yet ap proved or cleared by the [place] FDA and  has been authorized for detection and/or diagnosis of SARS-CoV-2 by FDA under an Emergency Use Authorization (EUA). This EUA will remain  in effect (meaning this test can be used) for the duration of the COVID-19 declaration under Section 564(b)(1) of the Act, 21 U.S.C. section 360bbb-3(b)(1), unless the authorization is terminated or revoked sooner.    Influenza A by PCR NEGATIVE NEGATIVE Final   Influenza B by PCR NEGATIVE NEGATIVE Final    Comment: (NOTE) The Xpert Xpress SARS-CoV-2/FLU/RSV assay is intended as an aid in  the diagnosis of influenza from Nasopharyngeal swab specimens and  should not be used as a sole basis for treatment. Nasal washings and  aspirates are unacceptable for Xpert Xpress SARS-CoV-2/FLU/RSV  testing. Fact Sheet for Patients: PinkCheek.be Fact Sheet for Healthcare Providers: GravelBags.it This test is not yet approved or cleared by the [place] FDA and  has been authorized for detection and/or diagnosis of SARS-CoV-2 by   FDA under an Emergency Use Authorization (EUA). This EUA will remain  in effect (meaning this test can be used) for the duration of the  Covid-19 declaration under Section 564(b)(1) of the Act, 21  U.S.C. section 360bbb-3(b)(1), unless the authorization is  terminated or revoked. Performed at [facility], [place] [address] [place], [place] [address]   MRSA PCR Screening     Status: None   Collection Time: 02/07/19  8:12 PM   Specimen: Nasopharyngeal  Result Value Ref Range Status   MRSA by PCR NEGATIVE NEGATIVE Final    Comment:        The GeneXpert MRSA Assay (FDA approved for NASAL specimens only), is one component of a comprehensive MRSA colonization surveillance program. It is not intended to diagnose MRSA infection nor to guide or monitor treatment for MRSA infections. Performed at [facility], [place] [address], [place], [place] [address]     No results for input(s): LIPASE, AMYLASE in the last 168 hours. Recent Labs  Lab 02/08/19 0714  AMMONIA <9*      Admission status: Inpatient: Based on patients clinical presentation and evaluation of above clinical data, I have made determination that patient meets Inpatient criteria at this time.   Oswald Hillock   [facility] If 7PM-7AM, please contact night-coverage at [email or web address], Office  [phone]  password [email or web address]  02/14/2019, 12:24 PM  LOS: 7 days

## 2019-02-14 NOTE — Progress Notes (Signed)
Inpatient Rehabilitation-Admissions Coordinator   Met with pt bedside for CIR assessment. Pt alert and oriented to time, place, and situation. She understands the need for rehab but is hesitant to commit to remaining in the hosptial for any length of time. She wants to see how she does today with therapy prior to making any determination about rehab. Will follow up once pt has worked with therapies to determine need and venue preference.   Raechel Ache, OTR/L  Rehab Admissions Coordinator  [phone] 02/14/2019 11:24 AM

## 2019-02-14 NOTE — TOC Progression Note (Signed)
Transition of Care [facility]) - Progression Note    Patient Details  Name: Judy Chang MRN: [id number] Date of Birth: [birth year]  Transition of Care [facility] CM/SW [place], [employer] Work Phone Number: 02/14/2019, 3:34 PM  Clinical Narrative:     MSW Intern went to speak with PT to discuss options other than CIR due to bed availability issues. She stated that she would prefer to go home versus SNF. Physical Therapy will see pt this afternoon to evaluate for appropriateness. SW will continue to follow.       Expected Discharge Plan and Services                                                 Social Determinants of Health (SDOH) Interventions    Readmission Risk Interventions No flowsheet data found.

## 2019-02-15 DIAGNOSIS — M25532 Pain in left wrist: Secondary | ICD-10-CM

## 2019-02-15 DIAGNOSIS — M25531 Pain in right wrist: Secondary | ICD-10-CM

## 2019-02-15 LAB — GLUCOSE, CAPILLARY
Glucose-Capillary: 104 mg/dL — ABNORMAL HIGH (ref 70–99)
Glucose-Capillary: 106 mg/dL — ABNORMAL HIGH (ref 70–99)
Glucose-Capillary: 145 mg/dL — ABNORMAL HIGH (ref 70–99)
Glucose-Capillary: 205 mg/dL — ABNORMAL HIGH (ref 70–99)
Glucose-Capillary: 85 mg/dL (ref 70–99)
Glucose-Capillary: 94 mg/dL (ref 70–99)

## 2019-02-15 MED ORDER — ALPRAZOLAM 0.5 MG PO TABS
0.5000 mg | ORAL_TABLET | Freq: Three times a day (TID) | ORAL | Status: DC | PRN
  Administered 2019-02-15 – 2019-02-17 (×5): 0.5 mg via ORAL
  Filled 2019-02-15 (×5): qty 1

## 2019-02-15 NOTE — Progress Notes (Signed)
Pt extremely anxious this morning, shaking, crying, hyperventilating. PRN Ativan given and symptoms decreasing.  MD made Aware.  Arlis Porta

## 2019-02-15 NOTE — Progress Notes (Addendum)
Inpatient Rehabilitation-Admissions Coordinator   Followed up with pt at the bedside this AM. She is more open to CIR if a bed becomes available soon. After just reviewing therapy notes today, pt is functionally making progress and is already Supervision for transfers and up to Min G/Min A ambulation 200 ft. Unfortunately I will not have a bed for this patient until next week. Anticipate pt will not require a CIR admission past this weekend. I will not pursue CIR admission for this patient. Would recommend home if she has 24/7 A.   Raechel Ache, OTR/L  Rehab Admissions Coordinator  [phone] 02/15/2019 11:40 AM   ADDENDUM 4:15PM: spoke with pt regarding recommendation. AC will sign off.   Raechel Ache, OTR/L  Rehab Admissions Coordinator  [phone] 02/15/2019 4:15 PM

## 2019-02-15 NOTE — Progress Notes (Signed)
Physical Therapy Treatment Patient Details Name: Judy Chang MRN: [id number] DOB: 22-Mar-1950 Today's Date: 02/15/2019    History of Present Illness  69 year old female admitted with working diagnosis of PRES, hypertensive crisis and associated status epilepticus on 1/14. LTM started. Loaded w/ keppra. MRI on 1/14 reported: "Abnormal cortical and subcortical edema in the occipital and posterior parietal brain bilaterally, with involvement also of the splenium of the corpus callosum. Patchy/speckled contrast enhancement. Think the findings are most consistent with posterior reversible encephalopathy in the subacute phase."     PT Comments    RN reports patient OK to participate but has been anxious this morning. She was received in bed, hesitant to participate in PT but easily convinced. Displays somewhat improved mobility today, does continue to require MinA for RW management and balance/safety when ambulating due to difficulty staying appropriate distance from RW as well as severe R/L drift and narrow BOS when ambulating. Very easily challenged by dynamic obstacles in environment, very easily distracted and would still be a very high fall risk if left to her own devices.  Continues to require total cues for navigation in her environment and limited carryover from previous therapy sessions. She was left in bed with all needs met, bed alarm active- continue to feel she remains appropriate for CIR if willing to accept care in this setting.    Follow Up Recommendations  Supervision/Assistance - 24 hour;CIR     Equipment Recommendations  Rolling walker with 5" wheels    Recommendations for Other Services       Precautions / Restrictions Precautions Precautions: Fall;Other (comment) Precaution Comments: seizure Restrictions Weight Bearing Restrictions: No    Mobility  Bed Mobility Overal bed mobility: Needs Assistance Bed Mobility: Supine to Sit;Sit to Supine     Supine to sit:  Supervision Sit to supine: Supervision   General bed mobility comments: Supervision for safety.  Transfers Overall transfer level: Needs assistance Equipment used: Rolling walker (2 wheeled) Transfers: Sit to/from Stand Sit to Stand: Supervision         General transfer comment: close S for balance, ongoing multimodal cues for hand placement but she insisted on pulling up on RW  Ambulation/Gait Ambulation/Gait assistance: Min assist;Min guard Gait Distance (Feet): 200 Feet Assistive device: Rolling walker (2 wheeled) Gait Pattern/deviations: Step-through pattern;Decreased stride length;Drifts right/left;Trunk flexed Gait velocity: Decreased   General Gait Details: very easily internally distracted today but only needed min guard-MinA for balance and RW management; with significant R/L drift with RW and blames wheels but PT did not notice any wheel sticking. Continues to require almost continuous cues for safety with RW   Stairs             Wheelchair Mobility    Modified Rankin (Stroke Patients Only)       Balance Overall balance assessment: Needs assistance Sitting-balance support: No upper extremity supported;Feet supported;Bilateral upper extremity supported Sitting balance-Leahy Scale: Good     Standing balance support: Bilateral upper extremity supported;During functional activity Standing balance-Leahy Scale: Poor Standing balance comment: reliant on external support, easily challenged by dynamic obstacles                            Cognition Arousal/Alertness: Awake/alert Behavior During Therapy: Anxious;WFL for tasks assessed/performed Overall Cognitive Status: Impaired/Different from baseline Area of Impairment: Attention;Memory;Following commands;Safety/judgement;Awareness;Problem solving                   Current Attention Level: Selective  Memory: Decreased short-term memory Following Commands: Follows one step commands  consistently;Follows one step commands with increased time Safety/Judgement: Decreased awareness of safety;Decreased awareness of deficits Awareness: Emergent Problem Solving: Slow processing;Difficulty sequencing;Requires verbal cues;Requires tactile cues General Comments: continues to demonstrate STM defiicts and needs ongoing totalA for finding her room. Very anxious today, had received at[place] earlier. Became tearful and stated I just couldn't sleep last night thinking about everything taht's been going on in the world      Exercises      General Comments General comments (skin integrity, edema, etc.): RA during session, VSS      Pertinent Vitals/Pain Pain Assessment: Faces Faces Pain Scale: Hurts a little bit Pain Location: L hand/arm  Pain Descriptors / Indicators: Sore Pain Intervention(s): Limited activity within patient's tolerance;Monitored during session    Home Living                      Prior Function            PT Goals (current goals can now be found in the care plan section) Acute Rehab PT Goals Patient Stated Goal: to go home PT Goal Formulation: With patient Time For Goal Achievement: 02/27/19 Potential to Achieve Goals: Good Progress towards PT goals: Progressing toward goals    Frequency    Min 3X/week      PT Plan Current plan remains appropriate    Co-evaluation              AM-PAC PT "6 Clicks" Mobility   Outcome Measure  Help needed turning from your back to your side while in a flat bed without using bedrails?: None Help needed moving from lying on your back to sitting on the side of a flat bed without using bedrails?: None Help needed moving to and from a bed to a chair (including a wheelchair)?: A Little Help needed standing up from a chair using your arms (e.g., wheelchair or bedside chair)?: A Little Help needed to walk in hospital room?: A Little Help needed climbing 3-5 steps with a railing? : A Little 6 Click  Score: 20    End of Session Equipment Utilized During Treatment: Gait belt Activity Tolerance: Patient tolerated treatment well Patient left: in bed;with call bell/phone within reach;with bed alarm set   PT Visit Diagnosis: Unsteadiness on feet (R26.81);Difficulty in walking, not elsewhere classified (R26.2)     Time: 1023-1036 PT Time Calculation (min) (ACUTE ONLY): 13 min  Charges:  $Gait Training: 8-22 mins                      U PT, DPT, PN1   Supplemental Physical Therapist     Pager [phone] Acute Rehab Office [phone]    

## 2019-02-15 NOTE — Progress Notes (Addendum)
[facility]  PROGRESS NOTE  Judy Chang:[id number] DOB: Mar 23, 1950 DOA: 02/07/2019 PCP: [name], MD   Brief HPI:   69 yr old African-American female with a past history of CAD, hypertension, hyperlipidemia, spinal stenosis requiring daily oxycodone as well as other, but is came with hypertensive crisis, PR ES with status epilepticus on 02/07/2019.  LTM was started and she was loaded with Keppra, received 40 mg of lorazepam and started on Cleviprex drip.  Patient was admitted under CCM service..  Neurology was consulted for further recommendations.  She was started on AEDs and nephrology recommended weaning AEDs in a month and arrange for outpatient follow-up on discharge.  She is weaned off Cleviprex drip and put back on home medications transferred to [facility] service on 02/11/2018.    Subjective   Patient seen and examined, still complains of wrist pain.  X-ray of right wrist shows degenerative changes.  No acute fracture.  Started on oxycodone as needed for pain.  She is more willing to go to CIR however bed is not available.  PT working with the patient.   Assessment/Plan:     1. Hypertensive emergency-resolved, patient is off Cleviprex drip.  Continue clonidine patch 0.2 mg transdermally every 7 days, amlodipine 10 mg daily, losartan 50 mg daily, Coreg 12.5 mg p.o. twice a day.  SBP goal less than 140.  Continue as needed hydralazine.  2. Status epilepticus in setting of PR ES and hypertensive emergency-patient was started on AEDs per neurology, currently on Keppra 500 mg every 12 hours, phenytoin 100 mg p.o. 3 times daily.  Neurology recommended weaning AEDs in 1 month and will arrange outpatient follow-up and discharge.  3. Acute metabolic/hypertensive encephalopathy-resolved, PR ES/postictal and polypharmacy from chronic pain/anxiety meds.  She was weaned off Precedex yesterday.  Restarted home medications as there was element of withdrawal.  Continue Lyrica,  fluoxetine, Seroquel.  4. CKD stage III-creatinine is stable at 1.34, patient's home medications have been resumed.  Continue losartan.  5. Erythrocytosis-patient's H&H was 21.4/63.0 on 02/07/2019, now improved to 15.7/47.6.  Continue to monitor.  6. Hypomagnesemia-replete.  7. Hypokalemia-replete  8. Diabetes mellitus-patient is on very sensitive sliding scale.  CBG 3 times daily with meals.  9. Right wrist pain-s/p fall, x-ray of the wrist shows no fracture.  It shows degenerative changes.  Continue oxycodone Tylenol as needed for pain.  Cannot give her NSAIDs due to history of allergy to NSAIDs.    SpO2: 97 % O2 Flow Rate (L/min): 4.5 L/min    Lab Results  Component Value Date   SARSCOV2NAA NEGATIVE 02/07/2019   [place] NEGATIVE 06/20/2018     CBG: Recent Labs  Lab 02/14/19 2038 02/14/19 2312 02/15/19 0339 02/15/19 0747 02/15/19 1132  GLUCAP 115* 126* 85 94 145*    CBC: Recent Labs  Lab 02/09/19 0342 02/11/19 0721 02/12/19 1552 02/13/19 0640  WBC 10.0 9.3 7.8 9.7  NEUTROABS  --   --  5.1 6.0  HGB 16.0* 18.6* 17.7* 15.7*  HCT 48.2* 53.7* 52.1* 47.6*  MCV 87.0 84.6 85.0 87.3  PLT 220 177 175 993    Basic Metabolic Panel: Recent Labs  Lab 02/09/19 0342 02/11/19 0721 02/12/19 1552 02/13/19 0640 02/14/19 0412  NA 140 139 140 141 144  K 3.8 3.1* 3.7 3.4* 3.7  CL 111 106 106 109 109  CO2 21* 20* 24 25 25   GLUCOSE 125* 77 109* 60* 82  BUN 21 15 24* 21 19  CREATININE 1.58* 1.35* 1.52* 1.34* 1.34*  CALCIUM 8.5* 8.8* 9.2 8.8* 8.9  MG  --   --  1.5* 2.0  --   PHOS  --   --  2.1* 2.7  --      Liver Function Tests: Recent Labs  Lab 02/12/19 1552 02/13/19 0640  AST 22 22  ALT 16 16  ALKPHOS 77 71  BILITOT 0.7 0.4  PROT 6.3* 5.7*  ALBUMIN 2.8* 2.5*        DVT prophylaxis: Heparin  Code Status: Full code  Family Communication: Discussed with husband on phone.  Disposition Plan: CIR versus home with home health PT          Scheduled medications:  . amLODipine  10 mg Oral Daily  . carvedilol  12.5 mg Oral BID WC  . cloNIDine  0.2 mg Transdermal Weekly  . FLUoxetine  20 mg Oral Daily  . heparin  5,000 Units Subcutaneous Q8H  . insulin aspart  0-6 Units Subcutaneous TID WC  . levETIRAcetam  500 mg Oral Q12H  . losartan  50 mg Oral Daily  . phenytoin  100 mg Oral BID  . pregabalin  75 mg Oral BID  . QUEtiapine  50 mg Oral BID    Consultants:  Neurology  Procedures:  EEG EEG 1/14 (initial): focal status epilepticus arising from right temporoparietal region.Additionally there is evidence of severe diffuse encephalopathy 1/14 after lorazepam and anticonvulsant load: Sharp waves seen in right frontotemporal region, maximal F8, No seizures noted Antibiotics:   Anti-infectives (From admission, onward)   None       Objective   Vitals:   02/15/19 0342 02/15/19 0700 02/15/19 0921 02/15/19 1130  BP: (!) 157/78 (!) 166/110 139/81 122/66  Pulse: 70 79  80  Resp: 16 18  20   Temp: 98.8 F (37.1 C) 98.8 F (37.1 C)  98.3 F (36.8 C)  TempSrc: Oral Oral  Oral  SpO2: 100% 100%  97%  Weight:      Height:       No intake or output data in the 24 hours ending 02/15/19 1225  No intake/output data recorded.  Filed Weights   02/13/19 0425 02/13/19 0433 02/14/19 0347  Weight: 51.1 kg 52.6 kg 51 kg    Physical Examination:   General-appears in no acute distress Heart-S1-S2, regular, no murmur auscultated Lungs-clear to auscultation bilaterally, no wheezing or crackles auscultated Abdomen-soft, nontender, no organomegaly Extremities-no edema in the lower extremities Neuro-alert, oriented x3, no focal deficit noted   Data Reviewed:   Recent Results (from the past 240 hour(s))  Respiratory Panel by RT PCR (Flu A&B, Covid) - Nasopharyngeal Swab     Status: None   Collection Time: 02/07/19  8:05 AM   Specimen: Nasopharyngeal Swab  Result Value Ref Range Status   SARS Coronavirus 2  by RT PCR NEGATIVE NEGATIVE Final    Comment: (NOTE) SARS-CoV-2 target nucleic acids are NOT DETECTED. The SARS-CoV-2 RNA is generally detectable in upper respiratoy specimens during the acute phase of infection. The lowest concentration of SARS-CoV-2 viral copies this assay can detect is 131 copies/mL. A negative result does not preclude SARS-Cov-2 infection and should not be used as the sole basis for treatment or other patient management decisions. A negative result may occur with  improper specimen collection/handling, submission of specimen other than nasopharyngeal swab, presence of viral mutation(s) within the areas targeted by this assay, and inadequate number of viral copies (<131 copies/mL). A negative result must be combined with clinical observations, patient history, and epidemiological information.  The expected result is Negative. Fact Sheet for Patients:  PinkCheek.be Fact Sheet for Healthcare Providers:  GravelBags.it This test is not yet ap proved or cleared by the [place] FDA and  has been authorized for detection and/or diagnosis of SARS-CoV-2 by FDA under an Emergency Use Authorization (EUA). This EUA will remain  in effect (meaning this test can be used) for the duration of the COVID-19 declaration under Section 564(b)(1) of the Act, 21 U.S.C. section 360bbb-3(b)(1), unless the authorization is terminated or revoked sooner.    Influenza A by PCR NEGATIVE NEGATIVE Final   Influenza B by PCR NEGATIVE NEGATIVE Final    Comment: (NOTE) The Xpert Xpress SARS-CoV-2/FLU/RSV assay is intended as an aid in  the diagnosis of influenza from Nasopharyngeal swab specimens and  should not be used as a sole basis for treatment. Nasal washings and  aspirates are unacceptable for Xpert Xpress SARS-CoV-2/FLU/RSV  testing. Fact Sheet for Patients: PinkCheek.be Fact Sheet for Healthcare  Providers: GravelBags.it This test is not yet approved or cleared by the [place] FDA and  has been authorized for detection and/or diagnosis of SARS-CoV-2 by  FDA under an Emergency Use Authorization (EUA). This EUA will remain  in effect (meaning this test can be used) for the duration of the  Covid-19 declaration under Section 564(b)(1) of the Act, 21  U.S.C. section 360bbb-3(b)(1), unless the authorization is  terminated or revoked. Performed at [facility], [place] [address], [place], [place] [address]   MRSA PCR Screening     Status: None   Collection Time: 02/07/19  8:12 PM   Specimen: Nasopharyngeal  Result Value Ref Range Status   MRSA by PCR NEGATIVE NEGATIVE Final    Comment:        The GeneXpert MRSA Assay (FDA approved for NASAL specimens only), is one component of a comprehensive MRSA colonization surveillance program. It is not intended to diagnose MRSA infection nor to guide or monitor treatment for MRSA infections. Performed at [facility], [place] [address], [place],  [address]     No results for input(s): LIPASE, AMYLASE in the last 168 hours. No results for input(s): AMMONIA in the last 168 hours.    Admission status: Inpatient: Based on patients clinical presentation and evaluation of above clinical data, I have made determination that patient meets Inpatient criteria at this time.   Oswald Hillock   [facility] If 7PM-7AM, please contact night-coverage at [email or web address], Office  [phone]  password [email or web address]  02/15/2019, 12:25 PM  LOS: 8 days

## 2019-02-16 LAB — GLUCOSE, CAPILLARY
Glucose-Capillary: 112 mg/dL — ABNORMAL HIGH (ref 70–99)
Glucose-Capillary: 140 mg/dL — ABNORMAL HIGH (ref 70–99)
Glucose-Capillary: 159 mg/dL — ABNORMAL HIGH (ref 70–99)
Glucose-Capillary: 178 mg/dL — ABNORMAL HIGH (ref 70–99)
Glucose-Capillary: 182 mg/dL — ABNORMAL HIGH (ref 70–99)
Glucose-Capillary: 85 mg/dL (ref 70–99)

## 2019-02-16 MED ORDER — PREDNISONE 20 MG PO TABS
40.0000 mg | ORAL_TABLET | Freq: Every day | ORAL | Status: DC
  Administered 2019-02-16 – 2019-02-17 (×2): 40 mg via ORAL
  Filled 2019-02-16 (×2): qty 2

## 2019-02-16 NOTE — Progress Notes (Signed)
[facility]  PROGRESS NOTE  Judy Chang YBO:[id number] DOB: [birth year] DOA: 02/07/2019 PCP: [name], MD   Brief HPI:   69 yr old African-American female with a past history of CAD, hypertension, hyperlipidemia, spinal stenosis requiring daily oxycodone as well as other, but is came with hypertensive crisis, PR ES with status epilepticus on 02/07/2019.  LTM was started and she was loaded with Keppra, received 40 mg of lorazepam and started on Cleviprex drip.  Patient was admitted under CCM service..  Neurology was consulted for further recommendations.  She was started on AEDs and nephrology recommended weaning AEDs in a month and arrange for outpatient follow-up on discharge.  She is weaned off Cleviprex drip and put back on home medications transferred to [facility] service on 02/11/2018.    Subjective   Patient seen and examined, continues to complain of right wrist pain.  X-ray of the wrist obtained shows osteoarthritis of first carpometacarpal joint.  No NSAIDs could be given as patient has allergy to NSAIDs.  Also noted CIR no longer pursuing patient to go to inpatient rehab.   Assessment/Plan:     1. Hypertensive emergency-resolved, patient is off Cleviprex drip.  Continue clonidine patch 0.2 mg transdermally every 7 days, amlodipine 10 mg daily, losartan 50 mg daily, Coreg 12.5 mg p.o. twice a day.  SBP goal less than 140.  Continue as needed hydralazine.  2. Status epilepticus in setting of PR ES and hypertensive emergency-patient was started on AEDs per neurology, currently on Keppra 500 mg every 12 hours, phenytoin 100 mg p.o. 3 times daily.  Neurology recommended weaning AEDs in 1 month and will arrange outpatient follow-up and discharge.  3. Acute metabolic/hypertensive encephalopathy-resolved, PR ES/postictal and polypharmacy from chronic pain/anxiety meds.  She was weaned off Precedex yesterday.  Restarted home medications as there was element of withdrawal.  Continue  Lyrica, fluoxetine, Seroquel.  4. CKD stage III-creatinine is stable at 1.34, patient's home medications have been resumed.  Continue losartan.  5. Erythrocytosis-patient's H&H was 21.4/63.0 on 02/07/2019, now improved to 15.7/47.6.  Continue to monitor.  6. Hypomagnesemia-replete.  7. Hypokalemia-replete  8. Diabetes mellitus-patient is on very sensitive sliding scale.  CBG 3 times daily with meals.  9. Right wrist pain-s/p fall, x-ray of the wrist shows no fracture.  It shows degenerative changes.  Continue oxycodone Tylenol as needed for pain.  Cannot give her NSAIDs due to history of allergy to NSAIDs.  Will start prednisone 40 mg daily.    SpO2: (!) 84 % O2 Flow Rate (L/min): 4.5 L/min    Lab Results  Component Value Date   SARSCOV2NAA NEGATIVE 02/07/2019   [place] NEGATIVE 06/20/2018     CBG: Recent Labs  Lab 02/15/19 1529 02/15/19 2018 02/15/19 2351 02/16/19 0406 02/16/19 0753  GLUCAP 106* 205* 104* 112* 178*    CBC: Recent Labs  Lab 02/11/19 0721 02/12/19 1552 02/13/19 0640  WBC 9.3 7.8 9.7  NEUTROABS  --  5.1 6.0  HGB 18.6* 17.7* 15.7*  HCT 53.7* 52.1* 47.6*  MCV 84.6 85.0 87.3  PLT 177 175 277    Basic Metabolic Panel: Recent Labs  Lab 02/11/19 0721 02/12/19 1552 02/13/19 0640 02/14/19 0412  NA 139 140 141 144  K 3.1* 3.7 3.4* 3.7  CL 106 106 109 109  CO2 20* 24 25 25   GLUCOSE 77 109* 60* 82  BUN 15 24* 21 19  CREATININE 1.35* 1.52* 1.34* 1.34*  CALCIUM 8.8* 9.2 8.8* 8.9  MG  --  1.5*  2.0  --   PHOS  --  2.1* 2.7  --      Liver Function Tests: Recent Labs  Lab 02/12/19 1552 02/13/19 0640  AST 22 22  ALT 16 16  ALKPHOS 77 71  BILITOT 0.7 0.4  PROT 6.3* 5.7*  ALBUMIN 2.8* 2.5*        DVT prophylaxis: Heparin  Code Status: Full code  Family Communication: Discussed with husband on phone.  Disposition Plan: Likely home with home health PT         Scheduled medications:  . amLODipine  10 mg Oral Daily  .  carvedilol  12.5 mg Oral BID WC  . cloNIDine  0.2 mg Transdermal Weekly  . FLUoxetine  20 mg Oral Daily  . heparin  5,000 Units Subcutaneous Q8H  . insulin aspart  0-6 Units Subcutaneous TID WC  . levETIRAcetam  500 mg Oral Q12H  . losartan  50 mg Oral Daily  . phenytoin  100 mg Oral BID  . predniSONE  40 mg Oral Q breakfast  . pregabalin  75 mg Oral BID  . QUEtiapine  50 mg Oral BID    Consultants:  Neurology  Procedures:  EEG EEG 1/14 (initial): focal status epilepticus arising from right temporoparietal region.Additionally there is evidence of severe diffuse encephalopathy 1/14 after lorazepam and anticonvulsant load: Sharp waves seen in right frontotemporal region, maximal F8, No seizures noted Antibiotics:   Anti-infectives (From admission, onward)   None       Objective   Vitals:   02/16/19 0408 02/16/19 0643 02/16/19 0750 02/16/19 0916  BP: (!) 142/67  (!) 148/82 (!) 143/85  Pulse: 70  74 74  Resp: 18  20   Temp: 98.3 F (36.8 C)  98.3 F (36.8 C)   TempSrc: Oral  Oral   SpO2: 96%  (!) 84%   Weight:  49.6 kg    Height:       No intake or output data in the 24 hours ending 02/16/19 1205  No intake/output data recorded.  Filed Weights   02/13/19 0433 02/14/19 0347 02/16/19 0643  Weight: 52.6 kg 51 kg 49.6 kg    Physical Examination:  General-appears in no acute distress Heart-S1-S2, regular, no murmur auscultated Lungs-clear to auscultation bilaterally, no wheezing or crackles auscultated Abdomen-soft, nontender, no organomegaly Extremities-no edema in the lower extremities Neuro-alert, oriented x3, no focal deficit noted Musculoskeletal-right wrist tender to palpation, warmth noted at the dorsal aspect of right wrist.  No erythema noted  Data Reviewed:   Recent Results (from the past 240 hour(s))  Respiratory Panel by RT PCR (Flu A&B, Covid) - Nasopharyngeal Swab     Status: None   Collection Time: 02/07/19  8:05 AM   Specimen: Nasopharyngeal  Swab  Result Value Ref Range Status   SARS Coronavirus 2 by RT PCR NEGATIVE NEGATIVE Final    Comment: (NOTE) SARS-CoV-2 target nucleic acids are NOT DETECTED. The SARS-CoV-2 RNA is generally detectable in upper respiratoy specimens during the acute phase of infection. The lowest concentration of SARS-CoV-2 viral copies this assay can detect is 131 copies/mL. A negative result does not preclude SARS-Cov-2 infection and should not be used as the sole basis for treatment or other patient management decisions. A negative result may occur with  improper specimen collection/handling, submission of specimen other than nasopharyngeal swab, presence of viral mutation(s) within the areas targeted by this assay, and inadequate number of viral copies (<131 copies/mL). A negative result must be combined with clinical  observations, patient history, and epidemiological information. The expected result is Negative. Fact Sheet for Patients:  PinkCheek.be Fact Sheet for Healthcare Providers:  GravelBags.it This test is not yet ap proved or cleared by the [place] FDA and  has been authorized for detection and/or diagnosis of SARS-CoV-2 by FDA under an Emergency Use Authorization (EUA). This EUA will remain  in effect (meaning this test can be used) for the duration of the COVID-19 declaration under Section 564(b)(1) of the Act, 21 U.S.C. section 360bbb-3(b)(1), unless the authorization is terminated or revoked sooner.    Influenza A by PCR NEGATIVE NEGATIVE Final   Influenza B by PCR NEGATIVE NEGATIVE Final    Comment: (NOTE) The Xpert Xpress SARS-CoV-2/FLU/RSV assay is intended as an aid in  the diagnosis of influenza from Nasopharyngeal swab specimens and  should not be used as a sole basis for treatment. Nasal washings and  aspirates are unacceptable for Xpert Xpress SARS-CoV-2/FLU/RSV  testing. Fact Sheet for  Patients: PinkCheek.be Fact Sheet for Healthcare Providers: GravelBags.it This test is not yet approved or cleared by the [place] FDA and  has been authorized for detection and/or diagnosis of SARS-CoV-2 by  FDA under an Emergency Use Authorization (EUA). This EUA will remain  in effect (meaning this test can be used) for the duration of the  Covid-19 declaration under Section 564(b)(1) of the Act, 21  U.S.C. section 360bbb-3(b)(1), unless the authorization is  terminated or revoked. Performed at [facility], [place] [address] [place], [place] [address]   MRSA PCR Screening     Status: None   Collection Time: 02/07/19  8:12 PM   Specimen: Nasopharyngeal  Result Value Ref Range Status   MRSA by PCR NEGATIVE NEGATIVE Final    Comment:        The GeneXpert MRSA Assay (FDA approved for NASAL specimens only), is one component of a comprehensive MRSA colonization surveillance program. It is not intended to diagnose MRSA infection nor to guide or monitor treatment for MRSA infections. Performed at [facility], [place] [address] [place], [place] [address]     No results for input(s): LIPASE, AMYLASE in the last 168 hours. No results for input(s): AMMONIA in the last 168 hours.    Admission status: Inpatient: Based on patients clinical presentation and evaluation of above clinical data, I have made determination that patient meets Inpatient criteria at this time.   Oswald Hillock   [facility] If 7PM-7AM, please contact night-coverage at [email or web address], Office  [phone]  password [email or web address]  02/16/2019, 12:05 PM  LOS: 9 days

## 2019-02-17 LAB — GLUCOSE, CAPILLARY
Glucose-Capillary: 86 mg/dL (ref 70–99)
Glucose-Capillary: 87 mg/dL (ref 70–99)
Glucose-Capillary: 134 mg/dL — ABNORMAL HIGH (ref 70–99)

## 2019-02-17 MED ORDER — LOSARTAN POTASSIUM 50 MG PO TABS: 50.0000 mg | ORAL_TABLET | Freq: Every day | ORAL | 2 refills | Status: DC

## 2019-02-17 MED ORDER — CLONIDINE 0.2 MG/24HR TD PTWK: 0.2000 mg | MEDICATED_PATCH | TRANSDERMAL | 12 refills | Status: DC

## 2019-02-17 MED ORDER — PHENYTOIN SODIUM EXTENDED 100 MG PO CAPS: ORAL_CAPSULE | ORAL | 0 refills | Status: DC

## 2019-02-17 MED ORDER — LEVETIRACETAM 500 MG PO TABS: 500.0000 mg | ORAL_TABLET | Freq: Two times a day (BID) | ORAL | 2 refills | Status: DC

## 2019-02-17 MED ORDER — AMLODIPINE BESYLATE 10 MG PO TABS: 10.0000 mg | ORAL_TABLET | Freq: Every day | ORAL | 2 refills | Status: DC

## 2019-02-17 MED ORDER — CARVEDILOL 12.5 MG PO TABS: 12.5000 mg | ORAL_TABLET | Freq: Two times a day (BID) | ORAL | 2 refills | Status: DC

## 2019-02-17 MED ORDER — PREDNISONE 10 MG PO TABS: ORAL_TABLET | ORAL | 0 refills | Status: DC

## 2019-02-17 MED ORDER — QUETIAPINE FUMARATE 50 MG PO TABS: 50.0000 mg | ORAL_TABLET | Freq: Every day | ORAL | 2 refills | Status: DC

## 2019-02-17 NOTE — Progress Notes (Signed)
Physical Therapy Treatment Patient Details Name: Judy Chang MRN: [id number] DOB: 02/16/1950 Today's Date: 02/17/2019    History of Present Illness  69 year old female admitted with working diagnosis of PRES, hypertensive crisis and associated status epilepticus on 1/14. LTM started. Loaded w/ keppra. MRI on 1/14 reported: "Abnormal cortical and subcortical edema in the occipital and posterior parietal brain bilaterally, with involvement also of the splenium of the corpus callosum. Patchy/speckled contrast enhancement. Think the findings are most consistent with posterior reversible encephalopathy in the subacute phase."     PT Comments    Pt now scheduled to d/c home. Pt moves quickly, but at min/guard level.  Practiced stairs today with pt using 1 rail and min/guard and recommend husband be with her. She stated she doesn't leave apartment much. Recommend HHPT and 24/7 S, as well as a RW.      Follow Up Recommendations  Home health PT;Supervision/Assistance - 24 hour     Equipment Recommendations  Rolling walker with 5" wheels    Recommendations for Other Services       Precautions / Restrictions Precautions Precautions: Fall;Other (comment) Precaution Comments: seizure Restrictions Weight Bearing Restrictions: No    Mobility  Bed Mobility               General bed mobility comments: sitting EOB upon arrival  Transfers Overall transfer level: Needs assistance Equipment used: Rolling walker (2 wheeled) Transfers: Sit to/from Stand Sit to Stand: Supervision         General transfer comment: S for safety/balance  Ambulation/Gait Ambulation/Gait assistance: Min assist Gait Distance (Feet): 350 Feet Assistive device: Rolling walker (2 wheeled) Gait Pattern/deviations: Drifts right/left;Narrow base of support;Step-through pattern Gait velocity: increased   General Gait Details: Pt distracted again today by the RW, but adjusted ans switched wheels to outside  of RW and this helped a bit. She ambulates very quickly with narrow BOS. Facilitated improved positioning within RW and safe speed. Instructed and practiced safe turns with RW and placement/positioning for increased safety.   Stairs Stairs: Yes Stairs assistance: Min guard Stair Management: One rail Right;Alternating pattern;Forwards Number of Stairs: 5(x2) General stair comments: Pt needed cues for speed, but did no physical A needed and able to perform with step through pattern and 1 rail.   Wheelchair Mobility    Modified Rankin (Stroke Patients Only)       Balance   Sitting-balance support: No upper extremity supported;Feet supported;Bilateral upper extremity supported Sitting balance-Leahy Scale: Good     Standing balance support: Bilateral upper extremity supported;During functional activity Standing balance-Leahy Scale: Poor                              Cognition Arousal/Alertness: Awake/alert Behavior During Therapy: WFL for tasks assessed/performed                         Memory: Decreased short-term memory Following Commands: Follows one step commands consistently;Follows one step commands with increased time Safety/Judgement: Decreased awareness of safety;Decreased awareness of deficits Awareness: Emergent Problem Solving: Slow processing;Difficulty sequencing;Requires verbal cues;Requires tactile cues General Comments: Pt able to find her room today after gait       Exercises      General Comments        Pertinent Vitals/Pain Pain Assessment: No/denies pain    Home Living  Prior Function            PT Goals (current goals can now be found in the care plan section) Acute Rehab PT Goals Patient Stated Goal: to go home PT Goal Formulation: With patient Time For Goal Achievement: 02/27/19 Potential to Achieve Goals: Good Progress towards PT goals: Progressing toward goals    Frequency     Min 3X/week      PT Plan Discharge plan needs to be updated    Co-evaluation              AM-PAC PT "6 Clicks" Mobility   Outcome Measure  Help needed turning from your back to your side while in a flat bed without using bedrails?: None Help needed moving from lying on your back to sitting on the side of a flat bed without using bedrails?: None Help needed moving to and from a bed to a chair (including a wheelchair)?: A Little Help needed standing up from a chair using your arms (e.g., wheelchair or bedside chair)?: A Little Help needed to walk in hospital room?: A Little Help needed climbing 3-5 steps with a railing? : A Little 6 Click Score: 20    End of Session Equipment Utilized During Treatment: Gait belt Activity Tolerance: Patient tolerated treatment well Patient left: in bed;with call bell/phone within reach;with bed alarm set;Other (comment)(sitting EOB with food) Nurse Communication: Mobility status PT Visit Diagnosis: Unsteadiness on feet (R26.81);Difficulty in walking, not elsewhere classified (R26.2)     Time: 9774-1423 PT Time Calculation (min) (ACUTE ONLY): 16 min  Charges:  $Gait Training: 8-22 mins                     Krystalyn Kubota L. Tamala Julian, [place] Pager [phone] 02/17/2019    Galen Manila 02/17/2019, 1:24 PM

## 2019-02-17 NOTE — TOC Transition Note (Signed)
Transition of Care [facility] - CM/SW Discharge Note   Patient Details  Name: Judy Chang MRN: [id number] Date of Birth: 05-06-1950  Transition of Care [facility]) CM/SW Contact:  Carles Collet, RN Phone Number: 02/17/2019, 2:56 PM   Clinical Narrative:    Spoke to patient on the phone, she is agreeable to [facility], discussed providers and she is agreeable to any that would take her insurance. Referral accepted by [facility]. She declined RW stating she has cane and shower seat at home. Her spouse will provide transportation home.     Final next level of care: [place] Barriers to Discharge: No Barriers Identified   Patient Goals and CMS Choice Patient states their goals for this hospitalization and ongoing recovery are:: to go home CMS Medicare.gov Compare Post Acute Care list provided to:: Patient Choice offered to / list presented to : Patient  Discharge Placement                       Discharge Plan and Services                DME Arranged: (declined DME, has cane and shower seat at home)         HH Arranged: PT HH Agency: [place] Date [place]: 02/17/19 Time [place]: [id number] Representative spoke with at [place]: [place] ([place]) Interventions     Readmission Risk Interventions No flowsheet data found.

## 2019-02-17 NOTE — Progress Notes (Signed)
Occupational Therapy Treatment Patient Details Name: Judy Chang MRN: [id number] DOB: 1950/07/16 Today's Date: 02/17/2019    History of present illness  69 year old female admitted with working diagnosis of PRES, hypertensive crisis and associated status epilepticus on 1/14. LTM started. Loaded w/ keppra. MRI on 1/14 reported: "Abnormal cortical and subcortical edema in the occipital and posterior parietal brain bilaterally, with involvement also of the splenium of the corpus callosum. Patchy/speckled contrast enhancement. 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 Focused session on cognition, safety, and preparation for home d/c. Pt able to complete bed mobility and transfers at supervision level this date. She is also able to complete functional mobility beyond a household distance at supervision with RW. Attempted functional mobility without RW and pt becomes unsteady, more prone to falls. Educated pt on this to improve insight into deficits, which seems to be improving. Educated pt on supervision for medication management, cooking, and having her husband drive. She needed min VC's to find her room after a multistep trail making task. Her awareness and problem solving had improved, used land marks and numbers to appropriately find her way. Her attention remains impaired, but believe she will do well with supervision. Updated recs to [facility] with supervision 24/7. Will continue to follow.   Follow Up Recommendations  Home health OT;Supervision/Assistance - 24 hour    Equipment Recommendations  3 in 1 bedside commode    Recommendations for Other Services      Precautions / Restrictions Precautions Precautions: Fall;Other (comment) Precaution Comments: seizure Restrictions Weight Bearing Restrictions: No       Mobility Bed Mobility Overal bed mobility: Needs  Assistance Bed Mobility: Supine to Sit;Sit to Supine     Supine to sit: Supervision Sit to supine: Supervision   General bed mobility comments: sitting EOB upon arrival  Transfers Overall transfer level: Needs assistance Equipment used: Rolling walker (2 wheeled);None Transfers: Sit to/from Stand Sit to Stand: Supervision         General transfer comment: S for safety/balance    Balance Overall balance assessment: Needs assistance Sitting-balance support: No upper extremity supported;Feet supported;Bilateral upper extremity supported Sitting balance-Leahy Scale: Good     Standing balance support: Bilateral upper extremity supported;During functional activity Standing balance-Leahy Scale: Poor Standing balance comment: reliant on external support, easily challenged by dynamic obstacles                           ADL either performed or assessed with clinical judgement   ADL                           Toilet Transfer: Nature conservation officer;Ambulation;RW           Functional mobility during ADLs: Min guard;Rolling walker;Cueing for safety General ADL Comments: pt progressing to min guard level, focused session on cogniton and functional mobility beyond household distance     Vision Patient Visual Report: No change from baseline Vision Assessment?: No apparent visual deficits   Perception     Praxis      Cognition Arousal/Alertness: Awake/alert Behavior During Therapy: WFL for tasks assessed/performed Overall Cognitive Status: Impaired/Different from baseline Area of Impairment: Attention;Safety/judgement;Awareness;Problem solving                   Current Attention Level: Selective Memory: Decreased short-term memory Following  Commands: Follows one step commands consistently;Follows one step commands with increased time Safety/Judgement: Decreased awareness of safety;Decreased awareness of deficits Awareness: Emergent Problem  Solving: Slow processing;Difficulty sequencing;Requires verbal cues;Requires tactile cues General Comments: pt continues to need safety ues for more complex trail making, her awareness has increased and her planning and strategizing to find room has improved        Exercises     Shoulder Instructions       General Comments      Pertinent Vitals/ Pain       Pain Assessment: No/denies pain  Home Living                                          Prior Functioning/Environment              Frequency  Min 2X/week        Progress Toward Goals  OT Goals(current goals can now be found in the care plan section)  Progress towards OT goals: Progressing toward goals  Acute Rehab OT Goals Patient Stated Goal: to go home OT Goal Formulation: With patient Time For Goal Achievement: 02/27/19 Potential to Achieve Goals: Good  Plan Discharge plan needs to be updated    Co-evaluation                 AM-PAC OT "6 Clicks" Daily Activity     Outcome Measure   Help from another person eating meals?: A Little Help from another person taking care of personal grooming?: A Little Help from another person toileting, which includes using toliet, bedpan, or urinal?: A Little Help from another person bathing (including washing, rinsing, drying)?: A Little Help from another person to put on and taking off regular upper body clothing?: A Little Help from another person to put on and taking off regular lower body clothing?: A Little 6 Click Score: 18    End of Session Equipment Utilized During Treatment: Gait belt;Rolling walker  OT Visit Diagnosis: Unsteadiness on feet (R26.81);Other abnormalities of gait and mobility (R26.89);Muscle weakness (generalized) (M62.81);History of falling (Z91.81);Other symptoms and signs involving cognitive function   Activity Tolerance Patient tolerated treatment well   Patient Left in bed;with call bell/phone within reach;with bed  alarm set   Nurse Communication Mobility status        Time: 1347-1401 OT Time Calculation (min): 14 min  Charges: OT General Charges $OT Visit: 1 Visit OT Treatments $Self Care/Home Management : 8-22 mins  Zenovia Jarred, MSOT, OTR/L Acute Rehabilitation Services [facility] Office Number: [phone]  Zenovia Jarred 02/17/2019, 2:40 PM

## 2019-02-17 NOTE — Discharge Summary (Addendum)
Physician Discharge Summary  NARJIS MIRA ZOX:[id number] DOB: [birth year] DOA: 02/07/2019  PCP: [name], MD  Admit date: 02/07/2019 Discharge date: 02/17/2019  Time spent: 50* minutes  Recommendations for Outpatient Follow-up:  1. Follow-up PCP in 1 week 2. Follow-up neurology in 4 weeks 3. Seizure precautions    Discharge Diagnoses:  Active Problems:   Status epilepticus ([place]) Posterior reversible encephalopathy syndrome Hypertensive emergency Depression Anxiety disorder  Discharge Condition: Stable  Diet recommendation: Regular diet  Filed Weights   02/14/19 0347 02/16/19 0643 02/17/19 0500  Weight: 51 kg 49.6 kg 54 kg    History of present illness:  69 yr old African-American female with a past history of CAD, hypertension, hyperlipidemia, spinal stenosis requiring daily oxycodone as well as other, but is came with hypertensive crisis, PR ES with status epilepticus on 02/07/2019.  LTM was started and she was loaded with Keppra, received 40 mg of lorazepam and started on Cleviprex drip.  Patient was admitted under CCM service..  Neurology was consulted for further recommendations.  She was started on AEDs and nephrology recommended weaning AEDs in a month and arrange for outpatient follow-up on discharge.  She is weaned off Cleviprex drip and put back on home medications transferred to [facility] service on 1/71/2020.   Hospital Course:   1. Hypertensive emergency-resolved, patient was started on Cleviprex drip, which was weaned off.  Continue clonidine patch 0.2 mg transdermally every 7 days, amlodipine 10 mg daily, losartan 50 mg daily, Coreg 12.5 mg p.o. twice a day.  SBP goal less than 140.    2. Status epilepticus in setting of PRES and hypertensive emergency-patient was started on AEDs per neurology, currently on Keppra 500 mg every 12 hours, phenytoin 100 mg p.o. 3 times daily.  Neurology recommended weaning Dilantin over next 6 days.  Called and discussed with  neurologist on-call Dr. Rory Percy,  continue with Keppra 500 mg p.o. twice daily.  Patient will follow up with neurology in 4 weeks.  3. Acute metabolic/hypertensive encephalopathy-resolved, PR ES/postictal and polypharmacy from chronic pain/anxiety meds.  She was weaned off Precedex.  Restarted home medications as there was element of withdrawal.  Continue Lyrica, fluoxetine, Seroquel.  4. CKD stage III-creatinine is stable at 1.34, patient's home medications have been resumed.  Continue losartan.  5. Erythrocytosis-patient's H&H was 21.4/63.0 on 02/07/2019, now improved to 15.7/47.6.  Continue to monitor.  6. Hypomagnesemia-replete.  7. Hypokalemia-replete   8.  Right wrist pain-s/p fall, x-ray of the wrist shows no fracture.  It showed degenerative changes.  Continue oxycodone Tylenol as needed for pain.  Cannot give her NSAIDs due to history of allergy to NSAIDs.    Improved with prednisone.  Will discharge on prednisone taper for 5 more days.  9.  Anxiety/depression-continue Prozac, patient started on Seroquel 50 mg p.o. twice daily in the hospital.  We will continue with Seroquel at this time.  Procedures:  EEG  Consultations:  Neurology  Discharge Exam: Vitals:   02/17/19 0700 02/17/19 1100  BP: (!) 150/77 (!) 146/84  Pulse: 71 65  Resp: 20 18  Temp: (!) 97.5 F (36.4 C) 98.7 F (37.1 C)  SpO2: 100% 100%    General: Appears in no acute distress Cardiovascular: S1-S2, regular Respiratory: Clear to auscultation bilaterally  Discharge Instructions   Discharge Instructions    Ambulatory referral to Neurology   Complete by: As directed    An appointment is requested in approximately: 4 weeks   Diet - low sodium heart healthy  Complete by: As directed    Discharge instructions   Complete by: As directed    Per [facility] statutes, patients with seizures/or period of unconsciousnessare not allowed to drive until they have been seizure-free for six  months. Use caution when using heavy equipment or power tools. Avoid working on ladders or at heights. Take showers instead of baths. Ensure the water temperature is not too high on the home water heater. Do not go swimming alone. When caring for infants or small children, sit down when holding, feeding, or changing them to minimize risk of injury to the child in the event you have a seizure.    Increase activity slowly   Complete by: As directed      Allergies as of 02/17/2019      Reactions   Nsaids Rash      Medication List    STOP taking these medications   aspirin EC 325 MG tablet   gabapentin 300 MG capsule Commonly known as: NEURONTIN   hydrochlorothiazide 25 MG tablet Commonly known as: HYDRODIURIL   lisinopril 20 MG tablet Commonly known as: ZESTRIL   lisinopril-hydrochlorothiazide 20-25 MG tablet Commonly known as: ZESTORETIC   oxyCODONE-acetaminophen 5-325 MG tablet Commonly known as: PERCOCET/ROXICET   pregabalin 75 MG capsule Commonly known as: LYRICA   traMADol 50 MG tablet Commonly known as: ULTRAM   traZODone 50 MG tablet Commonly known as: DESYREL     TAKE these medications   amLODipine 10 MG tablet Commonly known as: NORVASC Take 1 tablet (10 mg total) by mouth daily. Start taking on: February 18, 2019 What changed:   medication strength  how much to take   carvedilol 12.5 MG tablet Commonly known as: COREG Take 1 tablet (12.5 mg total) by mouth 2 (two) times daily with a meal.   cloNIDine 0.2 mg/24hr patch Commonly known as: CATAPRES - Dosed in mg/24 hr Place 1 patch (0.2 mg total) onto the skin once a week. Start taking on: February 23, 2019   docusate sodium 100 MG capsule Commonly known as: Colace Take 1 capsule (100 mg total) by mouth 2 (two) times daily.   FLUoxetine 20 MG capsule Commonly known as: PROZAC Take 20 mg by mouth daily.   GARLIC PO Take 1 tablet by mouth 2 (two) times daily.   levETIRAcetam 500 MG  tablet Commonly known as: KEPPRA Take 1 tablet (500 mg total) by mouth every 12 (twelve) hours.   loratadine 10 MG tablet Commonly known as: CLARITIN Take 10 mg by mouth daily as needed for allergies.   losartan 50 MG tablet Commonly known as: COZAAR Take 1 tablet (50 mg total) by mouth daily. Start taking on: February 18, 2019   Oxycodone HCl 20 MG Tabs Take 1 tablet by mouth 4 (four) times daily.   phenytoin 100 MG ER capsule Commonly known as: DILANTIN Take one tablet twice daily x 3 days then Take one tablet daily x 3 days then STOP   predniSONE 10 MG tablet Commonly known as: DELTASONE Prednisone 40 mg po daily x 1 day then Prednisone 30 mg po daily x 1 day then Prednisone 20 mg po daily x 1 day then Prednisone 10 mg daily x 1 day then stop...   QUEtiapine 50 MG tablet Commonly known as: SEROQUEL Take 1 tablet (50 mg total) by mouth at bedtime.   tiZANidine 4 MG tablet Commonly known as: ZANAFLEX Take 4 mg by mouth 3 (three) times daily as needed for muscle spasms.  Durable Medical Equipment  (From admission, onward)         Start     Ordered   02/17/19 1356  For home use only DME Walker rolling  Once    Question Answer Comment  Walker: With 5 Inch Wheels   Patient needs a walker to treat with the following condition PRES (posterior reversible encephalopathy syndrome)      02/17/19 1355         Allergies  Allergen Reactions  . Nsaids Rash   Follow-up Information    Cameron Sprang, MD. Schedule an appointment as soon as possible for a visit in 4 week(s).   Specialty: Neurology Contact information: [place] [address] [phone]        [name], MD Follow up in 1 week(s).   Specialty: Family Medicine Contact information: [place] [address] [address], [facility] Follow up.   Specialty: [place] Why: They wil call you in  1-2 days to set up your first home visit Contact information: [place] [address] [phone]            The results of significant diagnostics from this hospitalization (including imaging, microbiology, ancillary and laboratory) are listed below for reference.    Significant Diagnostic Studies: EEG  Result Date: 02/07/2019 Lora Havens, MD     02/07/2019 10:59 AM Patient Name: MAYAH URQUIDI MRN: [id number] Epilepsy Attending: Lora Havens Referring Physician/Provider: Dr. Kerney Elbe Date: 1/40/2021 Duration: 23.36 minutes Patient history: 69 year old female presented with altered mental status and seizure.  EEG evaluate for status epilepticus. Level of alertness: Comatose AEDs during EEG study: Ativan, Keppra, fosphenytoin Technical aspects: This EEG study was done with scalp electrodes positioned according to the 10-20 International system of electrode placement. Electrical activity was acquired at a sampling rate of 500Hz  and reviewed with a high frequency filter of 70Hz  and a low frequency filter of 1Hz . EEG data were recorded continuously and digitally stored. 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  Although it was difficult to visualize on camera, per neurology team patient had rhythmic jerking of left upper extremity and left gaze deviation.  EEG also showed continuous generalized 3-5 hz theta-delta slowing.  Hyperventilation and photic stimulation were not performed. Abnormality -Focal status epilepticus, right temporoparietal region. 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  Additionally there is evidence of severe diffuse encephalopathy. Dr. Cheral Marker was immediately notified.   CT ANGIO HEAD W OR WO CONTRAST  Result Date: 02/07/2019 CLINICAL DATA:  Focal neurological deficit. Severe headache and  possible seizures. EXAM: CT ANGIOGRAPHY HEAD AND NECK CT PERFUSION BRAIN TECHNIQUE: Multidetector CT imaging of the head and neck was performed using the standard protocol during bolus administration of intravenous contrast. Multiplanar CT image reconstructions and MIPs were obtained to evaluate the vascular anatomy. Carotid stenosis measurements (when applicable) are obtained utilizing NASCET criteria, using the distal internal carotid diameter as the denominator. Multiphase CT imaging of the brain was performed following IV bolus contrast injection. Subsequent parametric perfusion maps were calculated using RAPID software. CONTRAST:  173mL OMNIPAQUE IOHEXOL 350 MG/ML SOLN COMPARISON:  Head CT earlier same day FINDINGS: CTA NECK FINDINGS Aortic arch: Mild aortic atherosclerosis. No aneurysm or dissection. Branching pattern is normal without origin stenosis. Right carotid system: Common  carotid artery widely patent to the bifurcation. Soft and calcified plaque of the ICA bulb but no stenosis compared to the more distal cervical ICA. Left carotid system: Common carotid artery widely patent to the bifurcation. Soft and calcified plaque at the bifurcation and ICA bulb. Minimal diameter of the ICA bulb is 3.5 mm. Compared to a more distal cervical ICA diameter of the same, there is no stenosis. Vertebral arteries: Both vertebral artery origins are widely patent without stenosis. Both vertebral arteries appear normal through the cervical region. Skeleton: Ordinary cervical spondylosis. Other neck: Thyroid goiter without dominant nodule. Upper chest: Normal Review of the MIP images confirms the above findings CTA HEAD FINDINGS Anterior circulation: Both internal carotid arteries are patent through the skull base and siphon regions. There is ordinary atherosclerotic calcification affecting the carotid siphons but without stenosis greater than 30%. The anterior and middle cerebral vessels are patent without large or medium  vessel occlusion, proximal stenosis, aneurysm or vascular malformation. Posterior circulation: Both vertebral arteries are patent through the foramen magnum to the basilar. No basilar stenosis. Posterior circulation branch vessels are patent. Venous sinuses: Patent and normal. Anatomic variants: None significant. Review of the MIP images confirms the above findings CT Brain Perfusion Findings: ASPECTS: 10 CBF (<30%) Volume: 14mL Perfusion (Tmax>6.0s) volume: 37mL Mismatch Volume: 95mL Infarction Location:None IMPRESSION: No acute finding.  Negative perfusion evaluation. No large or medium vessel occlusion. Atherosclerotic disease at both carotid bifurcations but without stenosis. Atherosclerotic disease in both carotid siphon regions but without narrowing greater than 30%. These results were communicated to Dr. Cheral Marker at 9:16 amon 1/14/2021by text page via the [facility] messaging system. Electronically Signed   By: Nelson Chimes M.D.   On: 02/07/2019 09:18   DG Wrist Complete Right  Result Date: 02/14/2019 CLINICAL DATA:  Seizure, fall, distal RIGHT radial pain EXAM: RIGHT WRIST - COMPLETE 3+ VIEW COMPARISON:  None. FINDINGS: No distal radius or ulnar fracture. Radiocarpal joint is intact. No carpal fracture. No soft tissue abnormality. Sclerosis and joint space narrowing at the first carpal/metacarpal joint. Widening of the scapholunate interval (4 mm) IMPRESSION: 1. No wrist fracture. 2. Osteoarthritis of the first carpal/metacarpal joint. 3. Degenerative widening of the scapholunate interval. Electronically Signed   By: Suzy Bouchard M.D.   On: 02/14/2019 10:52   CT ANGIO NECK W OR WO CONTRAST  Result Date: 02/07/2019 CLINICAL DATA:  Focal neurological deficit. Severe headache and possible seizures. EXAM: CT ANGIOGRAPHY HEAD AND NECK CT PERFUSION BRAIN TECHNIQUE: Multidetector CT imaging of the head and neck was performed using the standard protocol during bolus administration of intravenous contrast.  Multiplanar CT image reconstructions and MIPs were obtained to evaluate the vascular anatomy. Carotid stenosis measurements (when applicable) are obtained utilizing NASCET criteria, using the distal internal carotid diameter as the denominator. Multiphase CT imaging of the brain was performed following IV bolus contrast injection. Subsequent parametric perfusion maps were calculated using RAPID software. CONTRAST:  115mL OMNIPAQUE IOHEXOL 350 MG/ML SOLN COMPARISON:  Head CT earlier same day FINDINGS: CTA NECK FINDINGS Aortic arch: Mild aortic atherosclerosis. No aneurysm or dissection. Branching pattern is normal without origin stenosis. Right carotid system: Common carotid artery widely patent to the bifurcation. Soft and calcified plaque of the ICA bulb but no stenosis compared to the more distal cervical ICA. Left carotid system: Common carotid artery widely patent to the bifurcation. Soft and calcified plaque at the bifurcation and ICA bulb. Minimal diameter of the ICA bulb is 3.5 mm. Compared to a more distal  cervical ICA diameter of the same, there is no stenosis. Vertebral arteries: Both vertebral artery origins are widely patent without stenosis. Both vertebral arteries appear normal through the cervical region. Skeleton: Ordinary cervical spondylosis. Other neck: Thyroid goiter without dominant nodule. Upper chest: Normal Review of the MIP images confirms the above findings CTA HEAD FINDINGS Anterior circulation: Both internal carotid arteries are patent through the skull base and siphon regions. There is ordinary atherosclerotic calcification affecting the carotid siphons but without stenosis greater than 30%. The anterior and middle cerebral vessels are patent without large or medium vessel occlusion, proximal stenosis, aneurysm or vascular malformation. Posterior circulation: Both vertebral arteries are patent through the foramen magnum to the basilar. No basilar stenosis. Posterior circulation branch  vessels are patent. Venous sinuses: Patent and normal. Anatomic variants: None significant. Review of the MIP images confirms the above findings CT Brain Perfusion Findings: ASPECTS: 10 CBF (<30%) Volume: 92mL Perfusion (Tmax>6.0s) volume: 59mL Mismatch Volume: 85mL Infarction Location:None IMPRESSION: No acute finding.  Negative perfusion evaluation. No large or medium vessel occlusion. Atherosclerotic disease at both carotid bifurcations but without stenosis. Atherosclerotic disease in both carotid siphon regions but without narrowing greater than 30%. These results were communicated to Dr. Cheral Marker at 9:16 amon 1/14/2021by text page via the [facility] messaging system. Electronically Signed   By: Nelson Chimes M.D.   On: 02/07/2019 09:18   MR BRAIN W WO CONTRAST  Result Date: 02/07/2019 CLINICAL DATA:  Severe headache. Possible seizure. Abnormal neurological exam. EXAM: MRI HEAD WITHOUT AND WITH CONTRAST TECHNIQUE: Multiplanar, multiecho pulse sequences of the brain and surrounding structures were obtained without and with intravenous contrast. CONTRAST:  81mL GADAVIST GADOBUTROL 1 MMOL/ML IV SOLN COMPARISON:  CT studies earlier same day FINDINGS: Brain: Diffusion imaging does not show any acute or subacute infarction. Minimal small vessel ischemic change of the pons. No cerebellar abnormality. Cerebral hemispheres show abnormal cortical and subcortical edema in the occipital and posterior parietal regions, with some involvement of the splenium of the corpus callosum. Findings likely to indicate posterior reversible encephalopathy. Patchy abnormal contrast enhancement in that region. Findings are most consistent with a subacute phase of posterior reversible encephalopathy. I did consider other diagnoses including vascular lymphoma or atypical infections with an unusual distribution but think those are un likely. No hemorrhage, hydrocephalus or extra-axial fluid collection. No evidence of pre-existing significant  small-vessel disease. Vascular: Major vessels at the base of the brain show flow. Skull and upper cervical spine: Negative Sinuses/Orbits: Clear/normal Other: None IMPRESSION: Abnormal cortical and subcortical edema in the occipital and posterior parietal brain bilaterally, with involvement also of the splenium of the corpus callosum. Patchy/speckled contrast enhancement. Think the findings are most consistent with posterior reversible encephalopathy in the subacute phase. See above discussion. Electronically Signed   By: Nelson Chimes M.D.   On: 02/07/2019 11:25   CT CEREBRAL PERFUSION W CONTRAST  Result Date: 02/07/2019 CLINICAL DATA:  Focal neurological deficit. Severe headache and possible seizures. EXAM: CT ANGIOGRAPHY HEAD AND NECK CT PERFUSION BRAIN TECHNIQUE: Multidetector CT imaging of the head and neck was performed using the standard protocol during bolus administration of intravenous contrast. Multiplanar CT image reconstructions and MIPs were obtained to evaluate the vascular anatomy. Carotid stenosis measurements (when applicable) are obtained utilizing NASCET criteria, using the distal internal carotid diameter as the denominator. Multiphase CT imaging of the brain was performed following IV bolus contrast injection. Subsequent parametric perfusion maps were calculated using RAPID software. CONTRAST:  15mL OMNIPAQUE IOHEXOL 350 MG/ML SOLN  COMPARISON:  Head CT earlier same day FINDINGS: CTA NECK FINDINGS Aortic arch: Mild aortic atherosclerosis. No aneurysm or dissection. Branching pattern is normal without origin stenosis. Right carotid system: Common carotid artery widely patent to the bifurcation. Soft and calcified plaque of the ICA bulb but no stenosis compared to the more distal cervical ICA. Left carotid system: Common carotid artery widely patent to the bifurcation. Soft and calcified plaque at the bifurcation and ICA bulb. Minimal diameter of the ICA bulb is 3.5 mm. Compared to a more  distal cervical ICA diameter of the same, there is no stenosis. Vertebral arteries: Both vertebral artery origins are widely patent without stenosis. Both vertebral arteries appear normal through the cervical region. Skeleton: Ordinary cervical spondylosis. Other neck: Thyroid goiter without dominant nodule. Upper chest: Normal Review of the MIP images confirms the above findings CTA HEAD FINDINGS Anterior circulation: Both internal carotid arteries are patent through the skull base and siphon regions. There is ordinary atherosclerotic calcification affecting the carotid siphons but without stenosis greater than 30%. The anterior and middle cerebral vessels are patent without large or medium vessel occlusion, proximal stenosis, aneurysm or vascular malformation. Posterior circulation: Both vertebral arteries are patent through the foramen magnum to the basilar. No basilar stenosis. Posterior circulation branch vessels are patent. Venous sinuses: Patent and normal. Anatomic variants: None significant. Review of the MIP images confirms the above findings CT Brain Perfusion Findings: ASPECTS: 10 CBF (<30%) Volume: 57mL Perfusion (Tmax>6.0s) volume: 47mL Mismatch Volume: 38mL Infarction Location:None IMPRESSION: No acute finding.  Negative perfusion evaluation. No large or medium vessel occlusion. Atherosclerotic disease at both carotid bifurcations but without stenosis. Atherosclerotic disease in both carotid siphon regions but without narrowing greater than 30%. These results were communicated to Dr. Cheral Marker at 9:16 amon 1/14/2021by text page via the [facility] messaging system. Electronically Signed   By: Nelson Chimes M.D.   On: 02/07/2019 09:18   Overnight EEG with video  Result Date: 02/08/2019 Lora Havens, MD     02/08/2019  1:12 PM Patient Name: LEEYA RUSCONI MRN: [id number] Epilepsy Attending: Lora Havens Referring Physician/Provider: Dr Kerney Elbe Duration: 02/07/2019 2229 to 02/08/2019 0926 Patient  history: 70 year old female presented with altered mental status and seizure.    Stat EEG showed status epilepticus therefore patient left for overnight video EEG monitoring. Level of alertness: Comatose/sedated AEDs during EEG study: Phenytoin Technical aspects: This EEG study was done with scalp electrodes positioned according to the 10-20 International system of electrode placement. Electrical activity was acquired at a sampling rate of 500Hz  and reviewed with a high frequency filter of 70Hz  and a low frequency filter of 1Hz . EEG data were recorded continuously and digitally stored. Description: At the beginning of the study, EEG showed sharp waves in right frontotemporal region (maximal F8).  EEG also showed continuous generalized 3 to 5 Hz theta-delta slowing admixed with 15 to 18 Hz generalized beta activity. Hyperventilation and photic stimulation were not performed. Abnormality -Sharp waves, right frontotemporal, maximal F8 -Continuous slow, generalized IMPRESSION: This study showed evidence of epileptogenic city in the right frontotemporal region.  There additionally, there was evidence of severe diffuse encephalopathy, nonspecific etiology but likely related to sedation. No seizures were seen throughout the recording. Lora Havens   CT HEAD CODE STROKE WO CONTRAST  Result Date: 02/07/2019 CLINICAL DATA:  Code stroke.  Severe headache with seizures. EXAM: CT HEAD WITHOUT CONTRAST TECHNIQUE: Contiguous axial images were obtained from the base of the skull through the vertex without  intravenous contrast. COMPARISON:  None. FINDINGS: Brain: No evidence of acute infarction, hemorrhage, hydrocephalus, extra-axial collection or mass lesion/mass effect. Vascular: No convincing hyperdense vessel when accounting for streak artifact. Skull: Negative for fracture or bone lesion. Sinuses/Orbits: Negative Other: These results were communicated to Dr. Cheral Marker at 8:36 amon 1/14/2021by text page via the [facility]  messaging system. ASPECTS [facility] Stroke Program Early CT Score) Not scored with this history IMPRESSION: No acute finding. Electronically Signed   By: Monte Fantasia M.D.   On: 02/07/2019 08:37    Microbiology: Recent Results (from the past 240 hour(s))  MRSA PCR Screening     Status: None   Collection Time: 02/07/19  8:12 PM   Specimen: Nasopharyngeal  Result Value Ref Range Status   MRSA by PCR NEGATIVE NEGATIVE Final    Comment:        The GeneXpert MRSA Assay (FDA approved for NASAL specimens only), is one component of a comprehensive MRSA colonization surveillance program. It is not intended to diagnose MRSA infection nor to guide or monitor treatment for MRSA infections. Performed at [facility], [place] [address], [place] [place], [place] [id number]      Labs: Basic Metabolic Panel: Recent Labs  Lab 02/11/19 0721 02/12/19 1552 02/13/19 0640 02/14/19 0412  NA 139 140 141 144  K 3.1* 3.7 3.4* 3.7  CL 106 106 109 109  CO2 20* 24 25 25   GLUCOSE 77 109* 60* 82  BUN 15 24* 21 19  CREATININE 1.35* 1.52* 1.34* 1.34*  CALCIUM 8.8* 9.2 8.8* 8.9  MG  --  1.5* 2.0  --   PHOS  --  2.1* 2.7  --    Liver Function Tests: Recent Labs  Lab 02/12/19 1552 02/13/19 0640  AST 22 22  ALT 16 16  ALKPHOS 77 71  BILITOT 0.7 0.4  PROT 6.3* 5.7*  ALBUMIN 2.8* 2.5*   No results for input(s): LIPASE, AMYLASE in the last 168 hours. No results for input(s): AMMONIA in the last 168 hours. CBC: Recent Labs  Lab 02/11/19 0721 02/12/19 1552 02/13/19 0640  WBC 9.3 7.8 9.7  NEUTROABS  --  5.1 6.0  HGB 18.6* 17.7* 15.7*  HCT 53.7* 52.1* 47.6*  MCV 84.6 85.0 87.3  PLT 177 175 183    CBG: Recent Labs  Lab 02/16/19 2018 02/16/19 2350 02/17/19 0356 02/17/19 0827 02/17/19 1156  GLUCAP 159* 140* 86 87 134*       Signed:  Oswald Hillock MD.  [facility] 02/17/2019, 3:13 PM

## 2019-02-17 NOTE — Progress Notes (Signed)
Discharge instructions gone over with patient. All questions answered. IV removed, Telemetry discontinued. All belongings and clothing packed and sent with patient. Husband arrived for transport home. Patient transported off unit to car via wheelchair. Gwendolyn Grant, RN

## 2019-04-01 ENCOUNTER — Ambulatory Visit: Payer: Medicare Other | Admitting: Neurology

## 2019-04-01 ENCOUNTER — Telehealth: Payer: Self-pay | Admitting: Neurology

## 2019-04-01 ENCOUNTER — Other Ambulatory Visit: Payer: Self-pay

## 2019-04-01 ENCOUNTER — Encounter: Payer: Self-pay | Admitting: Neurology

## 2019-04-01 VITALS — BP 151/72 | HR 58 | Temp 97.4°F | Ht 61.0 in | Wt 108.0 lb

## 2019-04-01 DIAGNOSIS — I6783 Posterior reversible encephalopathy syndrome: Secondary | ICD-10-CM | POA: Diagnosis not present

## 2019-04-01 DIAGNOSIS — I161 Hypertensive emergency: Secondary | ICD-10-CM | POA: Diagnosis not present

## 2019-04-01 DIAGNOSIS — G40901 Epilepsy, unspecified, not intractable, with status epilepticus: Secondary | ICD-10-CM

## 2019-04-01 DIAGNOSIS — G40109 Localization-related (focal) (partial) symptomatic epilepsy and epileptic syndromes with simple partial seizures, not intractable, without status epilepticus: Secondary | ICD-10-CM | POA: Diagnosis not present

## 2019-04-01 NOTE — Patient Instructions (Signed)
I had a long discussion with the patient regarding her recent hospitalization for partial seizures status epilepticus and significantly elevated blood pressure and abnormal MRI scan and answered questions.  I recommend she maintain aggressive blood pressure control with goal below 130/90.  Continue Dilantin and the current dose of 100 mg 3 times daily and Keppra 500 mg twice daily for seizure prophylaxis.  Check EEG and MRI scan of the brain.  If both are satisfactory may consider tapering Dilantin slowly 100 mg every few months but she will likely have to stay on Keppra for longer.  She was also advised to avoid seizure triggering factors like medication abrupt discontinuation, sleep deprivation, irregular eating and sleeping habits and stimulants like alcohol, marijuana or street drugs.  She was advised not to drive for 6 months as per [facility].  She will return for follow-up in the future in 3 months or call earlier if necessary.  Seizure, Adult A seizure is a sudden burst of abnormal electrical activity in the brain. Seizures usually last from 30 seconds to 2 minutes. They can cause many different symptoms. Usually, seizures are not harmful unless they last a long time. What are the causes? Common causes of this condition include:  Fever or infection.  Conditions that affect the brain, such as: ? A brain abnormality that you were born with. ? A brain or head injury. ? Bleeding in the brain. ? A tumor. ? Stroke. ? Brain disorders such as autism or cerebral palsy.  Low blood sugar.  Conditions that are passed from parent to child (are inherited).  Problems with substances, such as: ? Having a reaction to a drug or a medicine. ? Suddenly stopping the use of a substance (withdrawal). In some cases, the cause may not be known. A person who has repeated seizures over time without a clear cause has a condition called epilepsy. What increases the risk? You are more likely to get this  condition if you have:  A family history of epilepsy.  Had a seizure in the past.  A brain disorder.  A history of head injury, lack of oxygen at birth, or strokes. What are the signs or symptoms? There are many types of seizures. The symptoms vary depending on the type of seizure you have. Examples of symptoms during a seizure include:  Shaking (convulsions).  Stiffness in the body.  Passing out (losing consciousness).  Head nodding.  Staring.  Not responding to sound or touch.  Loss of bladder control and bowel control. Some people have symptoms right before and right after a seizure happens. Symptoms before a seizure may include:  Fear.  Worry (anxiety).  Feeling like you may vomit (nauseous).  Feeling like the room is spinning (vertigo).  Feeling like you saw or heard something before (dj vu).  Odd tastes or smells.  Changes in how you see. You may see flashing lights or spots. Symptoms after a seizure happens can include:  Confusion.  Sleepiness.  Headache.  Weakness on one side of the body. How is this treated? Most seizures will stop on their own in under 5 minutes. In these cases, no treatment is needed. Seizures that last longer than 5 minutes will usually need treatment. Treatment can include:  Medicines given through an IV tube.  Avoiding things that are known to cause your seizures. These can include medicines that you take for another condition.  Medicines to treat epilepsy.  Surgery to stop the seizures. This may be needed if  medicines do not help. Follow these instructions at home: Medicines  Take over-the-counter and prescription medicines only as told by your doctor.  Do not eat or drink anything that may keep your medicine from working, such as alcohol. Activity  Do not do any activities that would be dangerous if you had another seizure, like driving or swimming. Wait until your doctor says it is safe for you to do them.  If  you live in the [place] ask your local DMV (department of motor vehicles) when you can drive.  Get plenty of rest. Teaching others Teach friends and family what to do when you have a seizure. They should:  Lay you on the ground.  Protect your head and body.  Loosen any tight clothing around your neck.  Turn you on your side.  Not hold you down.  Not put anything into your mouth.  Know whether or not you need emergency care.  Stay with you until you are better.  General instructions  Contact your doctor each time you have a seizure.  Avoid anything that gives you seizures.  Keep a seizure diary. Write down: ? What you think caused each seizure. ? What you remember about each seizure.  Keep all follow-up visits as told by your doctor. This is important. Contact a doctor if:  You have another seizure.  You have seizures more often.  There is any change in what happens during your seizures.  You keep having seizures with treatment.  You have symptoms of being sick or having an infection. Get help right away if:  You have a seizure that: ? Lasts longer than 5 minutes. ? Is different than seizures you had before. ? Makes it harder to breathe. ? Happens after you hurt your head.  You have any of these symptoms after a seizure: ? Not being able to speak. ? Not being able to use a part of your body. ? Confusion. ? A bad headache.  You have two or more seizures in a row.  You do not wake up right after a seizure.  You get hurt during a seizure. These symptoms may be an emergency. Do not wait to see if the symptoms will go away. Get medical help right away. Call your local emergency services (911 in the [place] Do not drive yourself to the hospital. Summary  Seizures usually last from 30 seconds to 2 minutes. Usually, they are not harmful unless they last a long time.  Do not eat or drink anything that may keep your medicine from working, such as  alcohol.  Teach friends and family what to do when you have a seizure.  Contact your doctor each time you have a seizure. This information is not intended to replace advice given to you by your health care provider. Make sure you discuss any questions you have with your health care provider. Document Revised: 03/30/2018 Document Reviewed: 03/30/2018 Elsevier Patient Education  [place].

## 2019-04-01 NOTE — Telephone Encounter (Signed)
[employer] medicare order sent to [facility]. No auth they will reach out to the patient to schedule.  

## 2019-04-01 NOTE — Progress Notes (Signed)
[facility] [address] [place]. [place] [address] [phone]       OFFICE CONSULT NOTE  [name] Date of Birth:  [birth year] Medical Record Number:  [id number]   Referring MD: Kathrynn Speed Reason for Referral: Seizures and abnormal MRI  HPI: Judy Chang is a pleasant 69 year old African-American lady seen today for initial office consultation visit.  History is obtained from her and review of electronic medical records as well as have personally reviewed imaging films in PACS.  She has a past medical history of hypertension, hyperlipidemia, coronary artery disease and spinal stenosis on chronic pain medications presented to [facility] emergency room by EMS on 02/07/2019 with ongoing seizure activity.  She was last known well the night prior before going to bed but woke up at 3 AM the next day complaining of a headache with elevated blood pressure of 200/100.  Husband gave her blood pressure medication but subsequently she became less responsive and EMS were called and they noticed a blood pressure of 280/120 with Judy Chang having diffuse tremors and then subsequently having clear seizure activity.  She was given Versed 2.5 mg in route to the hospital.  Husband reported that she did drank 1 to 240 ounce beers daily but she had not recently changed in amount.  Upon arrival in the ER Judy Chang was noted having focal seizures with head rotated to the left with jerking with eyes deviated to the left.  She was given 2 mg Ativan for a total of 6 doses with cessation of clinical seizure activity.  She was loaded with Keppra 2 g and initial impression was that the seizure was due to alcohol withdrawal due to her drinking habit but husband and insisted there is no abrupt cessation.  EEG was obtained which showed focal status epilepticus arising from the right temporoparietal region.  She was also loaded with IV fosphenytoin and kept on long-term EEG monitoring overnight which  continued to show evidence of epileptogenic city in the right frontotemporal region.  Subsequently seizures first stopped.  MRI scan was obtained on 02/07/2019 which showed bilateral parieto-occipital white matter hyperintensities on FLAIR images consistent with posterior reversible encephalopathy syndrome.  Judy Chang's condition gradually improved and she became more receptive and had a nonfocal exam.  She was discharged home on Dilantin 100 mg 3 times daily and Keppra 500 twice daily.  Judy Chang states she has done well since then.  Her blood pressure is better controlled though it is still elevated in office today at 151/72.  She has had no further seizures.  Judy Chang does not remember the events during her hospitalization but can remember everything prior to that and after that she has not had any difficulty making new memories.  She states she is quit drinking alcohol and has not had any beer since being discharged.  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   Benign essential HTN 10/23/2015   Chondromalacia 02/25/2018   Noted on MRI: Mild   DDD (degenerative disc disease), lumbar 01/10/2011   Noted on MRI   Depression    DJD (degenerative joint disease)    GERD (gastroesophageal reflux disease)    History  of cataract    Bilateral   Hypercholesterolemia    Hypertension    Labial tear 02/25/2018   Noted on MRI: smal Left anterior    Lumbar spondylosis 01/10/2011   Noted on MRI   Migraines    Renal cyst 11/17/2015   Notedon [place] Abd: Simple, Right   Spinal stenosis 09/06/2016   Noted on MRI: Moderate-Severe   Vitreous floater, bilateral     Social History:  Social History    Socioeconomic History   Marital status: Married    Spouse name: Not on file   Number of children: 0   Years of education: Not on file   Highest education level: High school graduate  Occupational History   Not on file  Tobacco Use   Smoking status: Current Every Day Smoker    Types: Cigars   Smokeless tobacco: Never Used   Tobacco comment: off and on  Substance and Sexual Activity   Alcohol use: Not Currently    Comment: every few days, beer   Drug use: Not Currently    Types: Marijuana    Comment: last time 06/13/2018   Sexual activity: Not on file  Other Topics Concern   Not on file  Social History Narrative   Lives with husband   Right handed   Caffeine: decaf coffee everyday, 2-3 cups   Social Determinants of Health   Financial Resource Strain:    Difficulty of Paying Living Expenses: Not on file  Food Insecurity:    Worried About Charity fundraiser in the Last Year: Not on file   [employer] of Food in the Last Year: Not on file  Transportation Needs:    Lack of Transportation (Medical): Not on file   Lack of Transportation (Non-Medical): Not on file  Physical Activity:    Days of Exercise per Week: Not on file   Minutes of Exercise per Session: Not on file  Stress:    Feeling of Stress : Not on file  Social Connections:    Frequency of Communication with Friends and Family: Not on file   Frequency of Social Gatherings with Friends and Family: Not on file   Attends Religious Services: Not on file   Active Member of Clubs or Organizations: Not on file   Attends Archivist Meetings: Not on file   Marital Status: Not on file  Intimate Partner Violence:    Fear of Current or Ex-Partner: Not on file   Emotionally Abused: Not on file   Physically Abused: Not on file   Sexually Abused: Not on file    Medications:   Current Outpatient Medications on File Prior to Visit  Medication Sig Dispense Refill   amLODipine  (NORVASC) 10 MG tablet Take 1 tablet (10 mg total) by mouth daily. 30 tablet 2   carvedilol (COREG) 12.5 MG tablet Take 1 tablet (12.5 mg total) by mouth 2 (two) times daily with a meal. 60 tablet 2   cloNIDine (CATAPRES - DOSED IN MG/24 HR) 0.2 mg/24hr patch Place 1 patch (0.2 mg total) onto the skin once a week. 4 patch 12   docusate sodium (COLACE) 100 MG capsule Take 1 capsule (100 mg total) by mouth 2 (two) times daily. 30 capsule 0   FLUoxetine (PROZAC) 20 MG capsule Take 20 mg by mouth daily.     GARLIC PO Take 1 tablet by mouth 2 (two) times daily.     levETIRAcetam (KEPPRA) 500 MG tablet Take 1 tablet (500 mg total)  by mouth every 12 (twelve) hours. 60 tablet 2   loratadine (CLARITIN) 10 MG tablet Take 10 mg by mouth daily as needed for allergies.     losartan (COZAAR) 50 MG tablet Take 1 tablet (50 mg total) by mouth daily. 30 tablet 2   Oxycodone HCl 20 MG TABS Take 1 tablet by mouth 4 (four) times daily.   0   phenytoin (DILANTIN) 100 MG ER capsule Take one tablet twice daily x 3 days then Take one tablet daily x 3 days then STOP 10 capsule 0   predniSONE (DELTASONE) 10 MG tablet Prednisone 40 mg po daily x 1 day then Prednisone 30 mg po daily x 1 day then Prednisone 20 mg po daily x 1 day then Prednisone 10 mg daily x 1 day then stop... 10 tablet 0   QUEtiapine (SEROQUEL) 50 MG tablet Take 1 tablet (50 mg total) by mouth at bedtime. 60 tablet 2   tiZANidine (ZANAFLEX) 4 MG tablet Take 4 mg by mouth 3 (three) times daily as needed for muscle spasms.     No current facility-administered medications on file prior to visit.    Allergies:   Allergies  Allergen Reactions   Nsaids Rash    Physical Exam General: well developed, well nourished elderly African-American lady, seated, in no evident distress Head: head normocephalic and atraumatic.   Neck: supple with no carotid or supraclavicular bruits Cardiovascular: regular rate and rhythm, no murmurs Musculoskeletal:  no deformity Skin:  no rash/petichiae Vascular:  Normal pulses all extremities  Neurologic Exam Mental Status: Awake and fully alert. Oriented to place and time. Recent and remote memory intact. Attention span, concentration and fund of knowledge appropriate. Mood and affect appropriate.  Cranial Nerves: Fundoscopic exam reveals sharp disc margins. Pupils equal, briskly reactive to light. Extraocular movements full without nystagmus. Visual fields full to confrontation. Hearing intact. Facial sensation intact. Face, tongue, palate moves normally and symmetrically.  Motor: Normal bulk and tone. Normal strength in all tested extremity muscles. Sensory.: intact to touch , pinprick , position and vibratory sensation.  Coordination: Rapid alternating movements normal in all extremities. Finger-to-nose and heel-to-shin performed accurately bilaterally. Gait and Station: Arises from chair without difficulty. Stance is normal. Gait demonstrates normal stride length and balance . Able to heel, toe and tandem walk without difficulty.  Reflexes: 1+ and symmetric. Toes downgoing.       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  She is doing well and has made complete recovery     PLAN: I had a long discussion with the Judy Chang regarding her recent hospitalization for partial seizures status epilepticus and significantly elevated blood pressure and abnormal MRI scan and answered questions.  I recommend she maintain aggressive blood pressure control with goal below 130/90.  Continue Dilantin and the current dose of 100 mg 3 times daily and Keppra 500 mg twice daily for seizure prophylaxis.  Check EEG and MRI scan of the brain.  If both are satisfactory may consider tapering Dilantin slowly 100 mg every few months but she will likely have to stay on Keppra for longer.  She was  also advised to avoid seizure triggering factors like medication abrupt discontinuation, sleep deprivation, irregular eating and sleeping habits and stimulants like alcohol, marijuana or street drugs.  She was advised not to drive for 6 months as per [facility].  Greater than 50% time during this 50-minute consultation  visit was spent on counseling and coordination of care about her seizures and hypertensive urgency and abnormal MRI findings and answering questions.  She will return for follow-up in the future in 3 months or call earlier if necessary. Antony Contras, MD Note: This document was prepared with digital dictation and possible smart phrase technology. Any transcriptional errors that result from this process are unintentional.

## 2019-04-30 ENCOUNTER — Ambulatory Visit: Payer: Self-pay | Admitting: Neurology

## 2021-03-03 DIAGNOSIS — H1032 Unspecified acute conjunctivitis, left eye: Secondary | ICD-10-CM | POA: Diagnosis not present

## 2021-03-11 DIAGNOSIS — I10 Essential (primary) hypertension: Secondary | ICD-10-CM | POA: Diagnosis not present

## 2021-03-11 DIAGNOSIS — E7800 Pure hypercholesterolemia, unspecified: Secondary | ICD-10-CM | POA: Diagnosis not present

## 2021-03-22 DIAGNOSIS — I10 Essential (primary) hypertension: Secondary | ICD-10-CM | POA: Diagnosis not present

## 2021-05-07 ENCOUNTER — Encounter (HOSPITAL_COMMUNITY): Payer: Self-pay

## 2021-05-07 ENCOUNTER — Other Ambulatory Visit: Payer: Self-pay

## 2021-05-07 ENCOUNTER — Emergency Department (HOSPITAL_COMMUNITY): Payer: Medicare Other

## 2021-05-07 ENCOUNTER — Inpatient Hospital Stay (HOSPITAL_COMMUNITY)
Admission: EM | Admit: 2021-05-07 | Discharge: 2021-05-11 | DRG: 291 | Disposition: A | Discharge status: Home / Self Care | Payer: Medicare Other | Attending: Internal Medicine | Admitting: Internal Medicine

## 2021-05-07 DIAGNOSIS — I130 Hypertensive heart and chronic kidney disease with heart failure and stage 1 through stage 4 chronic kidney disease, or unspecified chronic kidney disease: Principal | ICD-10-CM | POA: Diagnosis present

## 2021-05-07 DIAGNOSIS — Z8249 Family history of ischemic heart disease and other diseases of the circulatory system: Secondary | ICD-10-CM

## 2021-05-07 DIAGNOSIS — Z886 Allergy status to analgesic agent status: Secondary | ICD-10-CM

## 2021-05-07 DIAGNOSIS — M79661 Pain in right lower leg: Secondary | ICD-10-CM | POA: Diagnosis not present

## 2021-05-07 DIAGNOSIS — E43 Unspecified severe protein-calorie malnutrition: Secondary | ICD-10-CM | POA: Diagnosis present

## 2021-05-07 DIAGNOSIS — Z20822 Contact with and (suspected) exposure to covid-19: Secondary | ICD-10-CM | POA: Diagnosis not present

## 2021-05-07 DIAGNOSIS — M7989 Other specified soft tissue disorders: Secondary | ICD-10-CM | POA: Diagnosis not present

## 2021-05-07 DIAGNOSIS — F32A Depression, unspecified: Secondary | ICD-10-CM | POA: Diagnosis present

## 2021-05-07 DIAGNOSIS — N184 Chronic kidney disease, stage 4 (severe): Secondary | ICD-10-CM | POA: Diagnosis present

## 2021-05-07 DIAGNOSIS — N281 Cyst of kidney, acquired: Secondary | ICD-10-CM | POA: Diagnosis not present

## 2021-05-07 DIAGNOSIS — Z79899 Other long term (current) drug therapy: Secondary | ICD-10-CM

## 2021-05-07 DIAGNOSIS — R0902 Hypoxemia: Secondary | ICD-10-CM

## 2021-05-07 DIAGNOSIS — F419 Anxiety disorder, unspecified: Secondary | ICD-10-CM | POA: Diagnosis present

## 2021-05-07 DIAGNOSIS — R609 Edema, unspecified: Secondary | ICD-10-CM | POA: Diagnosis not present

## 2021-05-07 DIAGNOSIS — Z6822 Body mass index (BMI) 22.0-22.9, adult: Secondary | ICD-10-CM

## 2021-05-07 DIAGNOSIS — M79662 Pain in left lower leg: Secondary | ICD-10-CM | POA: Diagnosis not present

## 2021-05-07 DIAGNOSIS — Z23 Encounter for immunization: Secondary | ICD-10-CM

## 2021-05-07 DIAGNOSIS — R918 Other nonspecific abnormal finding of lung field: Secondary | ICD-10-CM | POA: Diagnosis not present

## 2021-05-07 DIAGNOSIS — J9601 Acute respiratory failure with hypoxia: Secondary | ICD-10-CM | POA: Diagnosis present

## 2021-05-07 DIAGNOSIS — K219 Gastro-esophageal reflux disease without esophagitis: Secondary | ICD-10-CM | POA: Diagnosis present

## 2021-05-07 DIAGNOSIS — R0609 Other forms of dyspnea: Secondary | ICD-10-CM | POA: Diagnosis not present

## 2021-05-07 DIAGNOSIS — N179 Acute kidney failure, unspecified: Secondary | ICD-10-CM | POA: Diagnosis present

## 2021-05-07 DIAGNOSIS — R0602 Shortness of breath: Secondary | ICD-10-CM | POA: Diagnosis not present

## 2021-05-07 DIAGNOSIS — F17290 Nicotine dependence, other tobacco product, uncomplicated: Secondary | ICD-10-CM | POA: Diagnosis not present

## 2021-05-07 DIAGNOSIS — R600 Localized edema: Secondary | ICD-10-CM | POA: Diagnosis not present

## 2021-05-07 DIAGNOSIS — G8929 Other chronic pain: Secondary | ICD-10-CM | POA: Diagnosis not present

## 2021-05-07 DIAGNOSIS — N1831 Chronic kidney disease, stage 3a: Secondary | ICD-10-CM | POA: Diagnosis present

## 2021-05-07 DIAGNOSIS — I272 Pulmonary hypertension, unspecified: Secondary | ICD-10-CM | POA: Diagnosis not present

## 2021-05-07 DIAGNOSIS — R079 Chest pain, unspecified: Secondary | ICD-10-CM | POA: Diagnosis not present

## 2021-05-07 DIAGNOSIS — E041 Nontoxic single thyroid nodule: Secondary | ICD-10-CM | POA: Diagnosis not present

## 2021-05-07 DIAGNOSIS — M5136 Other intervertebral disc degeneration, lumbar region: Secondary | ICD-10-CM | POA: Diagnosis present

## 2021-05-07 DIAGNOSIS — R7989 Other specified abnormal findings of blood chemistry: Secondary | ICD-10-CM | POA: Diagnosis not present

## 2021-05-07 DIAGNOSIS — R569 Unspecified convulsions: Secondary | ICD-10-CM | POA: Diagnosis not present

## 2021-05-07 DIAGNOSIS — Z96642 Presence of left artificial hip joint: Secondary | ICD-10-CM | POA: Diagnosis not present

## 2021-05-07 DIAGNOSIS — M47816 Spondylosis without myelopathy or radiculopathy, lumbar region: Secondary | ICD-10-CM | POA: Diagnosis not present

## 2021-05-07 DIAGNOSIS — I161 Hypertensive emergency: Secondary | ICD-10-CM | POA: Diagnosis not present

## 2021-05-07 DIAGNOSIS — C73 Malignant neoplasm of thyroid gland: Secondary | ICD-10-CM | POA: Diagnosis not present

## 2021-05-07 DIAGNOSIS — I5031 Acute diastolic (congestive) heart failure: Secondary | ICD-10-CM | POA: Diagnosis not present

## 2021-05-07 DIAGNOSIS — I10 Essential (primary) hypertension: Secondary | ICD-10-CM | POA: Diagnosis not present

## 2021-05-07 DIAGNOSIS — Z90711 Acquired absence of uterus with remaining cervical stump: Secondary | ICD-10-CM

## 2021-05-07 DIAGNOSIS — R208 Other disturbances of skin sensation: Secondary | ICD-10-CM | POA: Diagnosis not present

## 2021-05-07 DIAGNOSIS — E010 Iodine-deficiency related diffuse (endemic) goiter: Secondary | ICD-10-CM | POA: Diagnosis not present

## 2021-05-07 DIAGNOSIS — J9811 Atelectasis: Secondary | ICD-10-CM | POA: Diagnosis not present

## 2021-05-07 DIAGNOSIS — K7460 Unspecified cirrhosis of liver: Secondary | ICD-10-CM | POA: Diagnosis not present

## 2021-05-07 DIAGNOSIS — I700 Atherosclerosis of aorta: Secondary | ICD-10-CM | POA: Diagnosis not present

## 2021-05-07 DIAGNOSIS — E7800 Pure hypercholesterolemia, unspecified: Secondary | ICD-10-CM | POA: Diagnosis present

## 2021-05-07 DIAGNOSIS — R2243 Localized swelling, mass and lump, lower limb, bilateral: Secondary | ICD-10-CM | POA: Diagnosis not present

## 2021-05-07 LAB — CBC WITH DIFFERENTIAL/PLATELET
Abs Immature Granulocytes: 0.04 10*3/uL (ref 0.00–0.07)
Basophils Absolute: 0.1 10*3/uL (ref 0.0–0.1)
Basophils Relative: 1 %
Eosinophils Absolute: 0.2 10*3/uL (ref 0.0–0.5)
Eosinophils Relative: 2 %
HCT: 41.7 % (ref 36.0–46.0)
Hemoglobin: 13.1 g/dL (ref 12.0–15.0)
Immature Granulocytes: 0 %
Lymphocytes Relative: 21 %
Lymphs Abs: 2.1 10*3/uL (ref 0.7–4.0)
MCH: 27.5 pg (ref 26.0–34.0)
MCHC: 31.4 g/dL (ref 30.0–36.0)
MCV: 87.6 fL (ref 80.0–100.0)
Monocytes Absolute: 1.3 10*3/uL — ABNORMAL HIGH (ref 0.1–1.0)
Monocytes Relative: 13 %
Neutro Abs: 6.1 10*3/uL (ref 1.7–7.7)
Neutrophils Relative %: 63 %
Platelets: 323 10*3/uL (ref 150–400)
RBC: 4.76 MIL/uL (ref 3.87–5.11)
RDW: 19.7 % — ABNORMAL HIGH (ref 11.5–15.5)
WBC: 9.7 10*3/uL (ref 4.0–10.5)
nRBC: 0 % (ref 0.0–0.2)

## 2021-05-07 LAB — RESP PANEL BY RT-PCR (FLU A&B, COVID) ARPGX2
Influenza A by PCR: NEGATIVE
Influenza B by PCR: NEGATIVE
SARS Coronavirus 2 by RT PCR: NEGATIVE

## 2021-05-07 LAB — BRAIN NATRIURETIC PEPTIDE: B Natriuretic Peptide: 373.9 pg/mL — ABNORMAL HIGH (ref 0.0–100.0)

## 2021-05-07 NOTE — ED Triage Notes (Signed)
Pt arrives to ED POV c/o bilateral leg swelling x1 week. Pt states she does not have CHF or COPD. O2 Sats 87% RA. ?

## 2021-05-07 NOTE — ED Provider Triage Note (Signed)
Emergency Medicine Provider Triage Evaluation Note ? ?Evern Bio , a 71 y.o. female  was evaluated in triage.  Pt complains of progressively worsening bilateral lower extremity swelling x 1-2 weeks. No alleviating/aggravating factors. Denies chest pain, dyspnea, cough, fever, orthopnea, or hemoptysis. Smokes cigarettes. Denies hx of CHF or COPD.  ? ?Review of Systems  ?Per above ? ?Physical Exam  ?BP (!) 160/78 (BP Location: Right Arm)   Pulse 70   Temp 98 ?F (36.7 ?C)   Resp 17   SpO2 93%  ?Gen:   Awake, no distress   ?Resp:  Normal effort  ?MSK:   Moves extremities without difficulty  ?Other:  Bilateral lower extremity edema present.  ? ?Medical Decision Making  ?Medically screening exam initiated at 10:24 PM.  Appropriate orders placed.  TALAJAH SLIMP was informed that the remainder of the evaluation will be completed by another provider, this initial triage assessment does not replace that evaluation, and the importance of remaining in the ED until their evaluation is complete. ? ?Hypoxic into the 80s on RA, placed on 2L via [place]- no complaints of dyspnea or chest pain from the patient, will check EKG, CXR, BNP and basic labs.  ? ?Leg swelling.  ?  ?Amaryllis Dyke, PA-C ?05/07/21 2229 ? ?

## 2021-05-08 ENCOUNTER — Encounter (HOSPITAL_COMMUNITY): Payer: Self-pay | Admitting: Internal Medicine

## 2021-05-08 ENCOUNTER — Emergency Department (HOSPITAL_COMMUNITY): Payer: Medicare Other

## 2021-05-08 ENCOUNTER — Observation Stay (HOSPITAL_COMMUNITY): Payer: Medicare Other

## 2021-05-08 ENCOUNTER — Observation Stay (HOSPITAL_BASED_OUTPATIENT_CLINIC_OR_DEPARTMENT_OTHER): Payer: Medicare Other

## 2021-05-08 DIAGNOSIS — E041 Nontoxic single thyroid nodule: Secondary | ICD-10-CM | POA: Diagnosis not present

## 2021-05-08 DIAGNOSIS — N179 Acute kidney failure, unspecified: Secondary | ICD-10-CM | POA: Diagnosis present

## 2021-05-08 DIAGNOSIS — R0902 Hypoxemia: Secondary | ICD-10-CM | POA: Diagnosis not present

## 2021-05-08 DIAGNOSIS — R208 Other disturbances of skin sensation: Secondary | ICD-10-CM

## 2021-05-08 DIAGNOSIS — R600 Localized edema: Secondary | ICD-10-CM | POA: Diagnosis not present

## 2021-05-08 DIAGNOSIS — E010 Iodine-deficiency related diffuse (endemic) goiter: Secondary | ICD-10-CM | POA: Diagnosis not present

## 2021-05-08 DIAGNOSIS — R7989 Other specified abnormal findings of blood chemistry: Secondary | ICD-10-CM | POA: Diagnosis not present

## 2021-05-08 DIAGNOSIS — K7460 Unspecified cirrhosis of liver: Secondary | ICD-10-CM | POA: Diagnosis not present

## 2021-05-08 DIAGNOSIS — R609 Edema, unspecified: Secondary | ICD-10-CM | POA: Diagnosis not present

## 2021-05-08 DIAGNOSIS — N281 Cyst of kidney, acquired: Secondary | ICD-10-CM | POA: Diagnosis not present

## 2021-05-08 DIAGNOSIS — R079 Chest pain, unspecified: Secondary | ICD-10-CM | POA: Diagnosis not present

## 2021-05-08 LAB — URINALYSIS, ROUTINE W REFLEX MICROSCOPIC
Bacteria, UA: NONE SEEN
Bilirubin Urine: NEGATIVE
Glucose, UA: NEGATIVE mg/dL
Ketones, ur: NEGATIVE mg/dL
Leukocytes,Ua: NEGATIVE
Nitrite: NEGATIVE
Protein, ur: 100 mg/dL — AB
Specific Gravity, Urine: 1.013 (ref 1.005–1.030)
pH: 5 (ref 5.0–8.0)

## 2021-05-08 LAB — HEPATITIS PANEL, ACUTE
HCV Ab: NONREACTIVE
Hep A IgM: NONREACTIVE
Hep B C IgM: NONREACTIVE
Hepatitis B Surface Ag: NONREACTIVE

## 2021-05-08 LAB — TSH: TSH: 1.108 u[IU]/mL (ref 0.350–4.500)

## 2021-05-08 LAB — CBC WITH DIFFERENTIAL/PLATELET
Abs Immature Granulocytes: 0.02 10*3/uL (ref 0.00–0.07)
Basophils Absolute: 0.1 10*3/uL (ref 0.0–0.1)
Basophils Relative: 1 %
Eosinophils Absolute: 0.2 10*3/uL (ref 0.0–0.5)
Eosinophils Relative: 3 %
HCT: 40.2 % (ref 36.0–46.0)
Hemoglobin: 12.8 g/dL (ref 12.0–15.0)
Immature Granulocytes: 0 %
Lymphocytes Relative: 19 %
Lymphs Abs: 1.1 10*3/uL (ref 0.7–4.0)
MCH: 27.2 pg (ref 26.0–34.0)
MCHC: 31.8 g/dL (ref 30.0–36.0)
MCV: 85.5 fL (ref 80.0–100.0)
Monocytes Absolute: 0.5 10*3/uL (ref 0.1–1.0)
Monocytes Relative: 8 %
Neutro Abs: 4.2 10*3/uL (ref 1.7–7.7)
Neutrophils Relative %: 69 %
Platelets: 338 10*3/uL (ref 150–400)
RBC: 4.7 MIL/uL (ref 3.87–5.11)
RDW: 19.3 % — ABNORMAL HIGH (ref 11.5–15.5)
WBC: 6.1 10*3/uL (ref 4.0–10.5)
nRBC: 0 % (ref 0.0–0.2)

## 2021-05-08 LAB — COMPREHENSIVE METABOLIC PANEL
ALT: 9 U/L (ref 0–44)
ALT: 8 U/L (ref 0–44)
AST: 15 U/L (ref 15–41)
AST: 14 U/L — ABNORMAL LOW (ref 15–41)
Albumin: 2.6 g/dL — ABNORMAL LOW (ref 3.5–5.0)
Albumin: 2.5 g/dL — ABNORMAL LOW (ref 3.5–5.0)
Alkaline Phosphatase: 89 U/L (ref 38–126)
Alkaline Phosphatase: 85 U/L (ref 38–126)
Anion gap: 4 — ABNORMAL LOW (ref 5–15)
Anion gap: 5 (ref 5–15)
BUN: 22 mg/dL (ref 8–23)
BUN: 20 mg/dL (ref 8–23)
CO2: 25 mmol/L (ref 22–32)
CO2: 25 mmol/L (ref 22–32)
Calcium: 9.2 mg/dL (ref 8.9–10.3)
Calcium: 9 mg/dL (ref 8.9–10.3)
Chloride: 111 mmol/L (ref 98–111)
Chloride: 109 mmol/L (ref 98–111)
Creatinine, Ser: 2.63 mg/dL — ABNORMAL HIGH (ref 0.44–1.00)
Creatinine, Ser: 2.36 mg/dL — ABNORMAL HIGH (ref 0.44–1.00)
GFR, Estimated: 19 mL/min — ABNORMAL LOW (ref >60)
GFR, Estimated: 21 mL/min — ABNORMAL LOW (ref >60)
Glucose, Bld: 79 mg/dL (ref 70–99)
Glucose, Bld: 110 mg/dL — ABNORMAL HIGH (ref 70–99)
Potassium: 4.3 mmol/L (ref 3.5–5.1)
Potassium: 4 mmol/L (ref 3.5–5.1)
Sodium: 140 mmol/L (ref 135–145)
Sodium: 139 mmol/L (ref 135–145)
Total Bilirubin: 0.6 mg/dL (ref 0.3–1.2)
Total Bilirubin: 0.5 mg/dL (ref 0.3–1.2)
Total Protein: 6.3 g/dL — ABNORMAL LOW (ref 6.5–8.1)
Total Protein: 6.2 g/dL — ABNORMAL LOW (ref 6.5–8.1)

## 2021-05-08 LAB — CREATININE, URINE, RANDOM: Creatinine, Urine: 113.67 mg/dL

## 2021-05-08 LAB — D-DIMER, QUANTITATIVE: D-Dimer, Quant: 1.41 ug/mL-FEU — ABNORMAL HIGH (ref 0.00–0.50)

## 2021-05-08 LAB — MAGNESIUM: Magnesium: 1.9 mg/dL (ref 1.7–2.4)

## 2021-05-08 LAB — PHOSPHORUS: Phosphorus: 3.6 mg/dL (ref 2.5–4.6)

## 2021-05-08 LAB — SODIUM, URINE, RANDOM: Sodium, Ur: 84 mmol/L

## 2021-05-08 MED ORDER — LORAZEPAM 0.5 MG PO TABS
0.5000 mg | ORAL_TABLET | Freq: Every day | ORAL | Status: DC
  Administered 2021-05-08 – 2021-05-11 (×4): 0.5 mg via ORAL
  Filled 2021-05-08 (×4): qty 1

## 2021-05-08 MED ORDER — ACETAMINOPHEN 650 MG RE SUPP
650.0000 mg | Freq: Four times a day (QID) | RECTAL | Status: DC | PRN
Start: 2021-05-08 — End: 2021-05-11

## 2021-05-08 MED ORDER — AMLODIPINE BESYLATE 10 MG PO TABS
10.0000 mg | ORAL_TABLET | Freq: Every day | ORAL | Status: DC
  Administered 2021-05-08 – 2021-05-11 (×4): 10 mg via ORAL
  Filled 2021-05-08 (×3): qty 1
  Filled 2021-05-08: qty 2

## 2021-05-08 MED ORDER — LEVETIRACETAM 500 MG PO TABS
500.0000 mg | ORAL_TABLET | Freq: Two times a day (BID) | ORAL | Status: DC
  Administered 2021-05-08 – 2021-05-11 (×7): 500 mg via ORAL
  Filled 2021-05-08 (×7): qty 1

## 2021-05-08 MED ORDER — ACETAMINOPHEN 325 MG PO TABS
650.0000 mg | ORAL_TABLET | Freq: Four times a day (QID) | ORAL | Status: DC | PRN
  Administered 2021-05-08 – 2021-05-10 (×2): 650 mg via ORAL
  Filled 2021-05-08 (×2): qty 2

## 2021-05-08 MED ORDER — ORAL CARE MOUTH RINSE
15.0000 mL | Freq: Two times a day (BID) | OROMUCOSAL | Status: DC
  Administered 2021-05-08 – 2021-05-11 (×4): 15 mL via OROMUCOSAL

## 2021-05-08 MED ORDER — CARVEDILOL 12.5 MG PO TABS
12.5000 mg | ORAL_TABLET | Freq: Two times a day (BID) | ORAL | Status: DC
  Administered 2021-05-08 – 2021-05-11 (×7): 12.5 mg via ORAL
  Filled 2021-05-08 (×7): qty 1

## 2021-05-08 MED ORDER — HYDRALAZINE HCL 20 MG/ML IJ SOLN: 10.0000 mg | INTRAMUSCULAR | Status: DC | PRN

## 2021-05-08 MED ORDER — PNEUMOCOCCAL 20-VAL CONJ VACC 0.5 ML IM SUSY
0.5000 mL | PREFILLED_SYRINGE | INTRAMUSCULAR | Status: AC
  Administered 2021-05-09: 0.5 mL via INTRAMUSCULAR
  Filled 2021-05-08: qty 0.5

## 2021-05-08 MED ORDER — BUPROPION HCL ER (XL) 150 MG PO TB24
150.0000 mg | ORAL_TABLET | Freq: Every morning | ORAL | Status: DC
  Administered 2021-05-08 – 2021-05-11 (×4): 150 mg via ORAL
  Filled 2021-05-08 (×4): qty 1

## 2021-05-08 MED ORDER — OXYCODONE HCL 5 MG PO TABS
20.0000 mg | ORAL_TABLET | Freq: Four times a day (QID) | ORAL | Status: DC | PRN
  Administered 2021-05-08 – 2021-05-10 (×4): 20 mg via ORAL
  Filled 2021-05-08 (×4): qty 4

## 2021-05-08 MED ORDER — CLONIDINE HCL 0.2 MG/24HR TD PTWK
0.2000 mg | MEDICATED_PATCH | TRANSDERMAL | Status: DC
  Administered 2021-05-10: 0.2 mg via TRANSDERMAL
  Filled 2021-05-08: qty 1

## 2021-05-08 MED ORDER — HEPARIN SODIUM (PORCINE) 5000 UNIT/ML IJ SOLN
5000.0000 [IU] | Freq: Three times a day (TID) | INTRAMUSCULAR | Status: DC
  Administered 2021-05-08 – 2021-05-11 (×9): 5000 [IU] via SUBCUTANEOUS
  Filled 2021-05-08 (×10): qty 1

## 2021-05-08 MED ORDER — TECHNETIUM TO 99M ALBUMIN AGGREGATED
3.8000 | Freq: Once | INTRAVENOUS | Status: AC | PRN
Start: 2021-05-08 — End: 2021-05-08
  Administered 2021-05-08: 3.8 via INTRAVENOUS

## 2021-05-08 MED ORDER — DULOXETINE HCL 60 MG PO CPEP
90.0000 mg | ORAL_CAPSULE | Freq: Every day | ORAL | Status: DC
  Administered 2021-05-08 – 2021-05-11 (×4): 90 mg via ORAL
  Filled 2021-05-08 (×5): qty 1

## 2021-05-08 NOTE — Progress Notes (Signed)
Notified on call tech Shanon Brow starek) for V/Q scan, stated he needed current chest xray within last 24hrs. Notified MD woods, stat chest xray ordered. On call tech for V/Q scan aware, stated he would be here within next 45 minutes.  ?

## 2021-05-08 NOTE — ED Provider Notes (Signed)
?[facility] ?Provider Note ? ?CSN: [id number] ?Arrival date & time: 05/07/21 2045 ? ?Chief Complaint(s) ?Leg Swelling ? ?HPI ?Judy Chang is a 71 y.o. female with a past medical history listed below including hypertension, longtime smoker who has not quit who presents to the emergency department with 2 weeks of gradually worsening bilateral lower extremity edema.  Reports that it began in the left lower extremity and progressed to the right several days later.  She denies any associated pain in the lower extremities.  She did report having a mechanical fall 2 weeks ago but denies any injury from them.  She denies any hip pain or back pain.  Denies any chest pain or shortness of breath at rest.  She does have dyspnea on exertion.  Denies any abdominal pain. ? ?Denies any history of heart failure.  No prior history of DVT/PEs.  No alcohol use.  No illicit drug use. ? ?In triage noted to be hypoxic into the mid 80s. ? ?The history is provided by the patient.  ? ?Past Medical History ?Past Medical History:  ?Diagnosis Date  ? 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 pt unaware  ? Anxiety   ? Arthritis   ? Avascular necrosis ([place])   ? Chronic left femoral head  ? Benign essential HTN 10/23/2015  ? 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 Noted on MRI: Mild  ? DDD (degenerative disc disease), lumbar 01/10/2011  ? Noted on MRI  ? Depression   ? DJD (degenerative joint disease)   ? GERD (gastroesophageal reflux disease)   ? History of cataract   ? Bilateral  ? Hypercholesterolemia   ? Hypertension   ? Labial tear 02/25/2018  ? Noted on MRI: smal Left anterior   ? Lumbar spondylosis 01/10/2011  ? Noted on MRI  ? Migraines   ? Renal cyst 11/17/2015  ? Notedon [place] Abd: Simple, Right  ? Spinal stenosis 09/06/2016  ? Noted on MRI: Moderate-Severe  ? Vitreous floater, bilateral   ? ?Patient Active Problem List  ? Diagnosis Date Noted  ? ARF (acute renal failure) ([place]) 05/08/2021  ? PRES  (posterior reversible encephalopathy syndrome) 04/01/2019  ? Hypertensive emergency 04/01/2019  ? Status epilepticus ([place]) 02/07/2019  ? Primary osteoarthritis of left hip 06/22/2018  ? Benign essential HTN 10/23/2015  ? 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 Chest pain, neg MI, neg for pul. embolus, normal ETT 10/22/2015  ? ?Home Medication(s) ?Prior to Admission medications   ?Medication Sig Start Date End Date Taking? Authorizing Provider  ?amLODipine (NORVASC) 10 MG tablet Take 1 tablet (10 mg total) by mouth daily. 02/18/19  Yes Oswald Hillock, MD  ?buPROPion (WELLBUTRIN XL) 150 MG 24 hr tablet Take 150 mg by mouth every morning. 03/12/21  Yes [provider]  ?carvedilol (COREG) 12.5 MG tablet Take 1 tablet (12.5 mg total) by mouth 2 (two) times daily with a meal. 02/17/19  Yes Darrick Meigs, Marge Duncans, MD  ?cloNIDine (CATAPRES - DOSED IN MG/24 HR) 0.2 mg/24hr patch Place 1 patch (0.2 mg total) onto the skin once a week. ?Patient taking differently: Place 0.2 mg onto the skin every Monday. 02/23/19  Yes Oswald Hillock, MD  ?DULoxetine (CYMBALTA) 30 MG capsule Take 90 mg by mouth daily. 03/12/21  Yes [provider]  ?levETIRAcetam (KEPPRA) 500 MG tablet Take 1 tablet (500 mg total) by mouth every 12 (twelve) hours. 02/17/19  Yes Oswald Hillock, MD  ?LORazepam (ATIVAN) 0.5 MG tablet  Take 0.5 mg by mouth daily. 03/12/21  Yes [provider]  ?losartan (COZAAR) 50 MG tablet Take 1 tablet (50 mg total) by mouth daily. 02/18/19  Yes Oswald Hillock, MD  ?Oxycodone HCl 20 MG TABS Take 1 tablet by mouth 4 (four) times daily. 10/29/14  Yes [provider]  ?traZODone (DESYREL) 100 MG tablet Take 100 mg by mouth at bedtime. 03/12/21  Yes [provider]  ?docusate sodium (COLACE) 100 MG capsule Take 1 capsule (100 mg total) by mouth 2 (two) times daily. ?Patient not taking: Reported on 05/08/2021 06/22/18   Gary Fleet, PA-C  ?QUEtiapine (SEROQUEL) 50 MG tablet Take 1 tablet (50 mg total) by  mouth at bedtime. ?Patient not taking: Reported on 05/08/2021 02/17/19   Oswald Hillock, MD  ?tiZANidine (ZANAFLEX) 4 MG tablet Take 4 mg by mouth 3 (three) times daily as needed for muscle spasms. ?Patient not taking: Reported on 05/08/2021 12/11/18   [provider]  ?                                                                                                                                  ?Allergies ?Nsaids ? ?Review of Systems ?Review of Systems ?As noted in HPI ? ?Physical Exam ?Vital Signs  ?I have reviewed the triage vital signs ?BP 135/70   Pulse (!) 59   Temp 98 ?F (36.7 ?C)   Resp 12   SpO2 95%  ? ?Physical Exam ?Vitals reviewed.  ?Constitutional:   ?   General: She is not in acute distress. ?   Appearance: She is well-developed. She is not diaphoretic.  ?HENT:  ?   Head: Normocephalic and atraumatic.  ?   Nose: Nose normal.  ?Eyes:  ?   General: No scleral icterus.    ?   Right eye: No discharge.     ?   Left eye: No discharge.  ?   Conjunctiva/sclera: Conjunctivae normal.  ?   Pupils: Pupils are equal, round, and reactive to light.  ?Neck:  ?   Vascular: No hepatojugular reflux or JVD.  ?Cardiovascular:  ?   Rate and Rhythm: Normal rate and regular rhythm.  ?   Heart sounds: No murmur heard. ?  No friction rub. No gallop.  ?Pulmonary:  ?   Effort: Pulmonary effort is normal. No respiratory distress.  ?   Breath sounds: Normal breath sounds. No stridor. No rales.  ?Abdominal:  ?   General: There is no distension.  ?   Palpations: Abdomen is soft.  ?   Tenderness: There is no abdominal tenderness.  ?Musculoskeletal:     ?   General: No tenderness.  ?   Cervical back: Normal range of motion and neck supple.  ?   Right lower leg: 3+ Pitting Edema present.  ?   Left lower leg: 3+ Pitting Edema present.  ?Skin: ?   General: Skin is warm and  dry.  ?   Findings: No erythema or rash.  ?Neurological:  ?   Mental Status: She is alert and oriented to person, place, and time.  ? ? ?ED Results and  Treatments ?Labs ?(all labs ordered are listed, but only abnormal results are displayed) ?Labs Reviewed  ?CBC WITH DIFFERENTIAL/PLATELET - Abnormal; Notable for the following components:  ?    Result Value  ? RDW 19.7 (*)   ? Monocytes Absolute 1.3 (*)   ? All other components within normal limits  ?BRAIN NATRIURETIC PEPTIDE - Abnormal; Notable for the following components:  ? B Natriuretic Peptide 373.9 (*)   ? All other components within normal limits  ?COMPREHENSIVE METABOLIC PANEL - Abnormal; Notable for the following components:  ? Creatinine, Ser 2.63 (*)   ? Total Protein 6.3 (*)   ? Albumin 2.6 (*)   ? GFR, Estimated 19 (*)   ? Anion gap 4 (*)   ? All other components within normal limits  ?D-DIMER, QUANTITATIVE - Abnormal; Notable for the following components:  ? D-Dimer, Quant 1.41 (*)   ? All other components within normal limits  ?RESP PANEL BY RT-PCR (FLU A&B, COVID) [id number]  ?URINALYSIS, ROUTINE W REFLEX MICROSCOPIC  ?                                                                                                                       ?EKG ? EKG Interpretation ? ?Date/Time:  Friday May 07 2021 22:52:47 EDT ?Ventricular Rate:  61 ?PR Interval:  156 ?QRS Duration: 82 ?QT Interval:  386 ?QTC Calculation: 388 ?R Axis:   74 ?Text Interpretation: Normal sinus rhythm Nonspecific T wave abnormality Abnormal ECG When compared with ECG of 07-Feb-2019 07:47, PREVIOUS ECG IS PRESENT No significant change was found Confirmed by Addison Lank [phone]) on 05/08/2021 12:12:59 AM ?  ? ?  ? ?Radiology ?DG Chest 2 View ? ?Result Date: 05/07/2021 ?CLINICAL DATA:  Hypoxia and shortness of breath. EXAM: CHEST - 2 VIEW COMPARISON:  Radiograph 06/14/2018, CT 10/22/2015 FINDINGS: The heart is normal in size. Stable mediastinal contours with aortic atherosclerosis. Chronic interstitial coarsening and mild scarring at the right lung base. There is new patchy opacity at the left lung base. No pleural effusion or pneumothorax. No  acute osseous abnormalities. IMPRESSION: 1. New patchy opacity at the left lung base, suspicious for pneumonia. 2. Background interstitial coarsening and right basilar scarring likely related to smoking. Electronically S

## 2021-05-08 NOTE — Progress Notes (Signed)
VASCULAR LAB ? ? ? ?ABIs have been performed. ? ?See CV proc for preliminary results. ? ? ?Cyan Moultrie, RVT ?05/08/2021, 9:10 AM ? ?

## 2021-05-08 NOTE — Progress Notes (Signed)
VASCULAR LAB ? ? ? ?Bilateral lower extremity venous duplex has been performed. ? ?See CV proc for preliminary results. ? ? ?Destynie Toomey, RVT ?05/08/2021, 9:09 AM ? ?

## 2021-05-08 NOTE — Progress Notes (Signed)
?PROGRESS NOTE ? ? ? ?Judy Chang  WLN:[id number] DOB: [birth year] DOA: 05/07/2021 ?PCP: [name], MD  ? ? ? ?Brief Narrative:  ?71 y.o. BF PMHx HTN , chronic pain, seizures  ? ?Presents to the ER because of increasing lower extremity edema over the last 2 weeks.  Patient states the lower extremity also has some pain.  Has no difficulty with walking.  Denies any chest pain or shortness of breath nausea vomiting or diarrhea.  Denies using any NSAIDs.  Has been making urine. ?  ?ED Course: In the ER patient was hemodynamically stable.  Lab work show patient has worsening renal function when compared to the 1 done in 2021.  Patient's creatinine has worsened from 1.34 in January 2021 and it is around 2.6 now.  Patient is here to give her UA sample.  On exam patient has significant lower extremity edema.  CT chest was ordered which shows enlarged thyroid.  And other nonspecific findings.  Admitted for acute renal failure with lower EXTR edema for further work-up. ? ? ?Subjective: ?Patient seen earlier today by [facility] hospitalist no charge ? ? ?Assessment & Plan: ?Covid vaccination; ?  ?Principal Problem: ?  ARF (acute renal failure) ([place]) ?Active Problems: ?  Benign essential HTN ?  Bilateral lower extremity edema ? ?Acute on CKD stage IIIa cause not clear. ?Lab Results  ?Component Value Date  ? CREATININE 2.36 (H) 05/08/2021  ? CREATININE 2.63 (H) 05/08/2021  ? CREATININE 1.34 (H) 02/14/2019  ? CREATININE 1.34 (H) 02/13/2019  ? CREATININE 1.52 (H) 02/12/2019  ?] ?  Patient has low albumin.  We will check a UA and also ultrasound of the kidneys and liver.  We will hold ARB for now.  Patient likely may need albumin if there is any evidence of liver cirrhosis. ?Bilateral lower extremity edema for which Dopplers of the lower extremity has been ordered.  We will check ABI. ?Hypertension on amlodipine clonidine patch Coreg.  We will keep patient n.p.o. and IV hydralazine.  Holding ARB due to renal failure. ?History  of seizures on Keppra. ?Enlarged thyroid seen in the CAT scan for which I ordered TSH. ?  ?Renal ultrasound, VQ scan of the lung, ultrasound of the liver and UA are pending. ? ?  ? ? ?Mobility Assessment (last 72 hours)   ? ? Mobility Assessment   ?No documentation. ? ?  ?  ? ?  ? ? ?DVT prophylaxis:  ?Code Status:  ?Family Communication:  ? ? ? ? ?Dispo: The patient is from:  ?             Anticipated d/c is to:  ?             Anticipated d/c date is:  ?             Patient currently  ? ? ? ? ? ?Consultants:  ? ? ?Procedures/Significant Events:  ? ? ?I have personally reviewed and interpreted all radiology studies and my findings are as above. ? ?VENTILATOR SETTINGS: ? ? ? ?Cultures ? ? ?Antimicrobials: ? ? ? ?Devices ?  ? ?LINES / TUBES:  ? ? ? ? ?Continuous Infusions: ? ? ?Objective: ?Vitals:  ? 05/08/21 0345 05/08/21 0600 05/08/21 0755 05/08/21 0800  ?BP: 135/70 126/64 129/65 135/72  ?Pulse: (!) 59  61 65  ?Resp: '12 14  19  '$ ?Temp:      ?SpO2: 95%   98%  ? ?No intake or output data in the 24 hours ending  05/08/21 4098 ?There were no vitals filed for this visit. ? ?Examination: ? ?Patient seen earlier today by [facility] hospitalist no charge ? ?.  ? ? ? ?Data Reviewed: Care during the described time interval was provided by me .  I have reviewed this patient's available data, including medical history, events of note, physical examination, and all test results as part of my evaluation. ? ?CBC: ?Recent Labs  ?Lab 05/07/21 ?2240  ?WBC 9.7  ?NEUTROABS 6.1  ?HGB 13.1  ?HCT 41.7  ?MCV 87.6  ?PLT 323  ? ?Basic Metabolic Panel: ?Recent Labs  ?Lab 05/08/21 ?1191  ?NA 140  ?K 4.3  ?CL 111  ?CO2 25  ?GLUCOSE 79  ?BUN 22  ?CREATININE 2.63*  ?CALCIUM 9.2  ? ?GFR: ?CrCl cannot be calculated (Unknown ideal weight.). ?Liver Function Tests: ?Recent Labs  ?Lab 05/08/21 ?4782  ?AST 15  ?ALT 9  ?ALKPHOS 89  ?BILITOT 0.6  ?PROT 6.3*  ?ALBUMIN 2.6*  ? ?No results for input(s): LIPASE, AMYLASE in the last 168 hours. ?No results for  input(s): AMMONIA in the last 168 hours. ?Coagulation Profile: ?No results for input(s): INR, PROTIME in the last 168 hours. ?Cardiac Enzymes: ?No results for input(s): CKTOTAL, CKMB, CKMBINDEX, TROPONINI in the last 168 hours. ?BNP (last 3 results) ?No results for input(s): PROBNP in the last 8760 hours. ?HbA1C: ?No results for input(s): HGBA1C in the last 72 hours. ?CBG: ?No results for input(s): GLUCAP in the last 168 hours. ?Lipid Profile: ?No results for input(s): CHOL, HDL, LDLCALC, TRIG, CHOLHDL, LDLDIRECT in the last 72 hours. ?Thyroid Function Tests: ?No results for input(s): TSH, T4TOTAL, FREET4, T3FREE, THYROIDAB in the last 72 hours. ?Anemia Panel: ?No results for input(s): VITAMINB12, FOLATE, FERRITIN, TIBC, IRON, RETICCTPCT in the last 72 hours. ?Sepsis Labs: ?No results for input(s): PROCALCITON, LATICACIDVEN in the last 168 hours. ? ?Recent Results (from the past 240 hour(s))  ?Resp Panel by RT-PCR (Flu A&B, Covid) Nasopharyngeal Swab     Status: None  ? Collection Time: 05/07/21 10:26 PM  ? Specimen: Nasopharyngeal Swab; Nasopharyngeal(NP) swabs in vial transport medium  ?Result Value Ref Range Status  ? SARS Coronavirus 2 by RT PCR NEGATIVE NEGATIVE Final  ?  Comment: (NOTE) ?SARS-CoV-2 target nucleic acids are NOT DETECTED. ? ?The SARS-CoV-2 RNA is generally detectable in upper respiratory ?specimens during the acute phase of infection. The lowest ?concentration of SARS-CoV-2 viral copies this assay can detect is ?138 copies/mL. A negative result does not preclude SARS-Cov-2 ?infection and should not be used as the sole basis for treatment or ?other patient management decisions. A negative result may occur with  ?improper specimen collection/handling, submission of specimen other ?than nasopharyngeal swab, presence of viral mutation(s) within the ?areas targeted by this assay, and inadequate number of viral ?copies(<138 copies/mL). A negative result must be combined with ?clinical observations,  patient history, and epidemiological ?information. The expected result is Negative. ? ?Fact Sheet for Patients:  ?EntrepreneurPulse.com.au ? ?Fact Sheet for Healthcare Providers:  ?IncredibleEmployment.be ? ?This test is no t yet approved or cleared by the [place] FDA and  ?has been authorized for detection and/or diagnosis of SARS-CoV-2 by ?FDA under an Emergency Use Authorization (EUA). This EUA will remain  ?in effect (meaning this test can be used) for the duration of the ?COVID-19 declaration under Section 564(b)(1) of the Act, 21 ?U.S.C.section 360bbb-3(b)(1), unless the authorization is terminated  ?or revoked sooner.  ? ? ?  ? Influenza A by PCR NEGATIVE NEGATIVE Final  ? Influenza  B by PCR NEGATIVE NEGATIVE Final  ?  Comment: (NOTE) ?The Xpert Xpress SARS-CoV-2/FLU/RSV plus assay is intended as an aid ?in the diagnosis of influenza from Nasopharyngeal swab specimens and ?should not be used as a sole basis for treatment. Nasal washings and ?aspirates are unacceptable for Xpert Xpress SARS-CoV-2/FLU/RSV ?testing. ? ?Fact Sheet for Patients: ?EntrepreneurPulse.com.au ? ?Fact Sheet for Healthcare Providers: ?IncredibleEmployment.be ? ?This test is not yet approved or cleared by the [place] FDA and ?has been authorized for detection and/or diagnosis of SARS-CoV-2 by ?FDA under an Emergency Use Authorization (EUA). This EUA will remain ?in effect (meaning this test can be used) for the duration of the ?COVID-19 declaration under Section 564(b)(1) of the Act, 21 U.S.C. ?section 360bbb-3(b)(1), unless the authorization is terminated or ?revoked. ? ?Performed at [facility], [place] [address] [place], [place] ?[address] ?  ?  ? ? ? ? ? ?Radiology Studies: ?DG Chest 2 View ? ?Result Date: 05/07/2021 ?CLINICAL DATA:  Hypoxia and shortness of breath. EXAM: CHEST - 2 VIEW COMPARISON:  Radiograph 06/14/2018, CT 10/22/2015 FINDINGS: The  heart is normal in size. Stable mediastinal contours with aortic atherosclerosis. Chronic interstitial coarsening and mild scarring at the right lung base. There is new patchy opacity at the left lung base. No

## 2021-05-08 NOTE — Discharge Instructions (Signed)
Medicare Outpatient Observation Notice ?  ?Patient name:  Judy Chang Patient number:  [id number]  ?                                                                                                                                                                     ?You?re a hospital outpatient receiving observation services. You are not an inpatient because: ? ?  ?acute renal failure ?  ?You require hospital care for evaluation and/or treatment.  It is expected you will need hospital care for less than a total of two days.  ?                                                                                                                                                                     ?  ?Being an outpatient may affect what you pay in a hospital: ?  ?When you?re a hospital outpatient, your observation stay is covered under Medicare Part B. ?  ?For Part B services, you generally pay: ?  ?A copayment for each outpatient hospital service you get. Part B copayments may vary by type of service. ?  ?20% of the Medicare-approved amount for most doctor services, after the Part B deductible. ?  ?Observation services may affect coverage and payment of your care after you leave the hospital: ? ?  ? ?If you need skilled nursing facility (SNF) care after you leave the hospital, Medicare Part A will only cover SNF care if you?ve had a 3-day minimum, medically necessary, inpatient hospital stay for a related illness or injury. An inpatient hospital stay begins the day the hospital admits you as an inpatient based on a doctor?s order and doesn?t include the day you?re discharged. ?  ?If you have Medicaid, a Medicare Advantage plan or other health plan, Medicaid or the plan may have different rules for SNF coverage after you leave the hospital. Check with Medicaid or your plan. ?  ?NOTE: Medicare Part A generally doesn?t cover outpatient hospital  services, like an observation stay. However, Part A will generally cover  medically necessary inpatient services if the hospital admits you as an inpatient based on a doctor?s order. In most cases, you?ll pay a one-time deductible for all of your inpatient hospital services for the first 60 days you?re in a hospital. ?                                                                                                                                                                     ?If you have any questions about your observation services, ask the hospital staff member giving you this notice or the doctor providing your hospital care. You can also ask to speak with someone from the hospital?s utilization or discharge planning department. ?  ?You can also call 1-800-MEDICARE (1-581-630-7532).  [employer] users should call 419-153-4907. ?  ?Form CMS [id number]   Expiration 01/23/2021 OMB APPROVAL [id number]  ?  ?  ? ?  ? ?Your costs for medications: ? ?  ? ?Generally, prescription and over-the-counter drugs, including ?self-administered drugs,? you get in a hospital outpatient setting (like an emergency department) aren?t covered by Part B. ?Self- administered drugs? are drugs you?d normally take on your own. For safety reasons, many hospitals don?t allow you to take medications brought from home. If you have a Medicare prescription drug plan (Part D), your plan may help you pay for these drugs. You?ll likely need to pay out-of- pocket for these drugs and submit a claim to your drug plan for a refund. Contact your drug plan for more information. ?  ?                                                                                                                                                                     ?If you?re enrolled in a Medicare Advantage plan (like an HMO or PPO) or other Medicare health plan (Part C), your costs and coverage may be different. Check with your plan to find out about  coverage for outpatient observation services. ? ?  ?If you?re a Chief of Staff  through your state Medicaid program, you can?t be billed for Part A or Part B deductibles, coinsurance, and copayments. ? ?                                                                                                                                                                    ?Additional Information (Optional): ?  ?  ?  ?  ?  ?                                                                                                                                                                     ?Please sign below to show you received and understand this notice. ? ?  ? ?                                    Date: 05/08/21 / Time:3:10 PM ?  ?CMS does not discriminate in its programs and activities. To request this publication in alternative format, please call: 1-800-MEDICARE or email:[email or web address]. ?  ?Form CMS [id number]   Expiration 01/23/2021 OMB APPROVAL 1443-1540  ?  ? ? ?Patient ? ? ?Add ?No image attached ?Trace ?Slow ?Corrupt ?Edit Data ?Change Template ?Print ?On  ? ?

## 2021-05-08 NOTE — Progress Notes (Signed)
Ms. Strohmeier is being admitted to room 5W09. Klinkner is alert and oriented x4. Connected her to telemetry. Assessed skin with Devin RN. Oriented to room, call light, and bed controls. Husband Kordelia Severin) at bedside. ?

## 2021-05-08 NOTE — ED Notes (Signed)
Patient transported to Vascular 

## 2021-05-08 NOTE — Care Management Obs Status (Signed)
MEDICARE OBSERVATION STATUS NOTIFICATION ? ? ?Patient Details  ?Name: Judy Chang ?MRN: [id number] ?Date of Birth: 1950-03-11 ? ? ?Medicare Observation Status Notification Given:  Yes ? ?Verbal permission given to sign. Notice attached to DC instructions ? ?Verdell Carmine, RN ?05/08/2021, 3:11 PM ?

## 2021-05-08 NOTE — H&P (Signed)
?History and Physical  ? ? ?SYANNE LOONEY XBJ:[id number] DOB: 12/18/1950 DOA: 05/07/2021 ? ?PCP: [name], MD  ?Patient coming from: Home. ? ?Chief Complaint: Lower extremity edema. ? ?HPI: Judy Chang is a 71 y.o. female with history of hypertension, chronic pain, seizures presents to the ER because of increasing lower extremity edema over the last 2 weeks.  Patient states the lower extremity also has some pain.  Has no difficulty with walking.  Denies any chest pain or shortness of breath nausea vomiting or diarrhea.  Denies using any NSAIDs.  Has been making urine. ? ?ED Course: In the ER patient was hemodynamically stable.  Lab work show patient has worsening renal function when compared to the 1 done in 2021.  Patient's creatinine has worsened from 1.34 in January 2021 and it is around 2.6 now.  Patient is here to give his UA sample.  On exam patient has significant lower extremity edema.  CT chest was ordered which shows enlarged thyroid.  And other nonspecific findings.  Admitted for acute renal failure with lower EXTR edema for further work-up. ? ?Review of Systems: As per HPI, rest all negative. ? ? ?Past Medical History:  ?Diagnosis Date  ? 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 pt unaware  ? Anxiety   ? Arthritis   ? Avascular necrosis ([place])   ? Chronic left femoral head  ? Benign essential HTN 10/23/2015  ? 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 Noted on MRI: Mild  ? DDD (degenerative disc disease), lumbar 01/10/2011  ? Noted on MRI  ? Depression   ? DJD (degenerative joint disease)   ? GERD (gastroesophageal reflux disease)   ? History of cataract   ? Bilateral  ? Hypercholesterolemia   ? Hypertension   ? Labial tear 02/25/2018  ? Noted on MRI: smal Left anterior   ? Lumbar spondylosis 01/10/2011  ? Noted on MRI  ? Migraines   ? Renal cyst 11/17/2015  ? Notedon [place] Abd: Simple, Right  ? Spinal stenosis 09/06/2016  ? Noted on MRI: Moderate-Severe  ? Vitreous floater, bilateral   ? ? ?Past  Surgical History:  ?Procedure Laterality Date  ? COLONOSCOPY    ? PARTIAL HYSTERECTOMY    ? ROTATOR CUFF REPAIR Left   ? TONSILLECTOMY    ? TOTAL HIP ARTHROPLASTY Left 06/22/2018  ? Procedure: TOTAL HIP ARTHROPLASTY ANTERIOR APPROACH;  Surgeon: Dorna Leitz, MD;  Location: [facility];  Service: Orthopedics;  Laterality: Left;  ? ? ? reports that she has been smoking cigars. She has never used smokeless tobacco. She reports that she does not currently use alcohol. She reports that she does not currently use drugs after having used the following drugs: Marijuana. ? ?Allergies  ?Allergen Reactions  ? Nsaids Rash  ? ? ?Family History  ?Problem Relation Age of Onset  ? Hypertension Mother   ? Hypertension Father   ? ? ?Prior to Admission medications   ?Medication Sig Start Date End Date Taking? Authorizing Provider  ?amLODipine (NORVASC) 10 MG tablet Take 1 tablet (10 mg total) by mouth daily. 02/18/19  Yes Oswald Hillock, MD  ?buPROPion (WELLBUTRIN XL) 150 MG 24 hr tablet Take 150 mg by mouth every morning. 03/12/21  Yes [provider]  ?carvedilol (COREG) 12.5 MG tablet Take 1 tablet (12.5 mg total) by mouth 2 (two) times daily with a meal. 02/17/19  Yes Darrick Meigs, Marge Duncans, MD  ?cloNIDine (CATAPRES - DOSED IN MG/24 HR) 0.2 mg/24hr patch Place 1 patch (  0.2 mg total) onto the skin once a week. ?Patient taking differently: Place 0.2 mg onto the skin every Monday. 02/23/19  Yes Oswald Hillock, MD  ?DULoxetine (CYMBALTA) 30 MG capsule Take 90 mg by mouth daily. 03/12/21  Yes [provider]  ?levETIRAcetam (KEPPRA) 500 MG tablet Take 1 tablet (500 mg total) by mouth every 12 (twelve) hours. 02/17/19  Yes Oswald Hillock, MD  ?LORazepam (ATIVAN) 0.5 MG tablet Take 0.5 mg by mouth daily. 03/12/21  Yes [provider]  ?losartan (COZAAR) 50 MG tablet Take 1 tablet (50 mg total) by mouth daily. 02/18/19  Yes Oswald Hillock, MD  ?Oxycodone HCl 20 MG TABS Take 1 tablet by mouth 4 (four) times daily. 10/29/14  Yes [provider]  ?traZODone (DESYREL) 100 MG tablet Take 100 mg by mouth at bedtime. 03/12/21  Yes [provider]  ?docusate sodium (COLACE) 100 MG capsule Take 1 capsule (100 mg total) by mouth 2 (two) times daily. ?Patient not taking: Reported on 05/08/2021 06/22/18   Gary Fleet, PA-C  ?QUEtiapine (SEROQUEL) 50 MG tablet Take 1 tablet (50 mg total) by mouth at bedtime. ?Patient not taking: Reported on 05/08/2021 02/17/19   Oswald Hillock, MD  ?tiZANidine (ZANAFLEX) 4 MG tablet Take 4 mg by mouth 3 (three) times daily as needed for muscle spasms. ?Patient not taking: Reported on 05/08/2021 12/11/18   [provider]  ? ? ?Physical Exam: ?Constitutional: Moderately built and nourished. ?Vitals:  ? 05/08/21 0057 05/08/21 0145 05/08/21 0215 05/08/21 0345  ?BP: 133/75 137/77 132/68 135/70  ?Pulse: 63 63 60 (!) 59  ?Resp: '15 15 11 12  '$ ?Temp:      ?SpO2: 93% 95% 95% 95%  ? ?Eyes: Anicteric no pallor. ?ENMT: No discharge from the ears eyes nose and mouth. ?Neck: No mass felt.  No neck rigidity.  No JVD appreciated. ?Respiratory: No rhonchi or crepitations. ?Cardiovascular: S1-S2 heard. ?Abdomen: Soft nontender bowel sound present. ?Musculoskeletal: Bilateral lower extremity edema extending up to the knee. ?Skin: No rash. ?Neurologic: Alert awake oriented time place and person.  Moves all extremities. ?Psychiatric: Appears normal.  Normal affect. ? ? ?Labs on Admission: I have personally reviewed following labs and imaging studies ? ?CBC: ?Recent Labs  ?Lab 05/07/21 ?2240  ?WBC 9.7  ?NEUTROABS 6.1  ?HGB 13.1  ?HCT 41.7  ?MCV 87.6  ?PLT 323  ? ?Basic Metabolic Panel: ?Recent Labs  ?Lab 05/08/21 ?0569  ?NA 140  ?K 4.3  ?CL 111  ?CO2 25  ?GLUCOSE 79  ?BUN 22  ?CREATININE 2.63*  ?CALCIUM 9.2  ? ?GFR: ?CrCl cannot be calculated (Unknown ideal weight.). ?Liver Function Tests: ?Recent Labs  ?Lab 05/08/21 ?7948  ?AST 15  ?ALT 9  ?ALKPHOS 89  ?BILITOT 0.6  ?PROT 6.3*  ?ALBUMIN 2.6*  ? ?No results for input(s):  LIPASE, AMYLASE in the last 168 hours. ?No results for input(s): AMMONIA in the last 168 hours. ?Coagulation Profile: ?No results for input(s): INR, PROTIME in the last 168 hours. ?Cardiac Enzymes: ?No results for input(s): CKTOTAL, CKMB, CKMBINDEX, TROPONINI in the last 168 hours. ?BNP (last 3 results) ?No results for input(s): PROBNP in the last 8760 hours. ?HbA1C: ?No results for input(s): HGBA1C in the last 72 hours. ?CBG: ?No results for input(s): GLUCAP in the last 168 hours. ?Lipid Profile: ?No results for input(s): CHOL, HDL, LDLCALC, TRIG, CHOLHDL, LDLDIRECT in the last 72 hours. ?Thyroid Function Tests: ?No results for input(s): TSH, T4TOTAL, FREET4, T3FREE, THYROIDAB in the last  72 hours. ?Anemia Panel: ?No results for input(s): VITAMINB12, FOLATE, FERRITIN, TIBC, IRON, RETICCTPCT in the last 72 hours. ?Urine analysis: ?   ?Component Value Date/Time  ? COLORURINE YELLOW 06/22/2018 7342  ? APPEARANCEUR HAZY (A) 06/22/2018 8768  ? LABSPEC 1.012 06/22/2018 0822  ? PHURINE 5.0 06/22/2018 0822  ? GLUCOSEU NEGATIVE 06/22/2018 0822  ? [place] NEGATIVE 06/22/2018 0822  ? [place] NEGATIVE 06/22/2018 0822  ? KETONESUR 5 (A) 06/22/2018 1157  ? PROTEINUR 100 (A) 06/22/2018 2620  ? UROBILINOGEN 0.2 11/25/2014 1651  ? NITRITE NEGATIVE 06/22/2018 0822  ? LEUKOCYTESUR NEGATIVE 06/22/2018 3559  ? ?Sepsis Labs: ?'@LABRCNTIP'$ (procalcitonin:4,lacticidven:4) ?) ?Recent Results (from the past 240 hour(s))  ?Resp Panel by RT-PCR (Flu A&B, Covid) Nasopharyngeal Swab     Status: None  ? Collection Time: 05/07/21 10:26 PM  ? Specimen: Nasopharyngeal Swab; Nasopharyngeal(NP) swabs in vial transport medium  ?Result Value Ref Range Status  ? SARS Coronavirus 2 by RT PCR NEGATIVE NEGATIVE Final  ?  Comment: (NOTE) ?SARS-CoV-2 target nucleic acids are NOT DETECTED. ? ?The SARS-CoV-2 RNA is generally detectable in upper respiratory ?specimens during the acute phase of infection. The lowest ?concentration of SARS-CoV-2 viral copies this  assay can detect is ?138 copies/mL. A negative result does not preclude SARS-Cov-2 ?infection and should not be used as the sole basis for treatment or ?other patient management decisions. A negative result may occ

## 2021-05-09 DIAGNOSIS — F17290 Nicotine dependence, other tobacco product, uncomplicated: Secondary | ICD-10-CM | POA: Diagnosis present

## 2021-05-09 DIAGNOSIS — I272 Pulmonary hypertension, unspecified: Secondary | ICD-10-CM | POA: Diagnosis present

## 2021-05-09 DIAGNOSIS — I130 Hypertensive heart and chronic kidney disease with heart failure and stage 1 through stage 4 chronic kidney disease, or unspecified chronic kidney disease: Secondary | ICD-10-CM | POA: Diagnosis present

## 2021-05-09 DIAGNOSIS — R0902 Hypoxemia: Secondary | ICD-10-CM | POA: Diagnosis not present

## 2021-05-09 DIAGNOSIS — G8929 Other chronic pain: Secondary | ICD-10-CM | POA: Diagnosis present

## 2021-05-09 DIAGNOSIS — R569 Unspecified convulsions: Secondary | ICD-10-CM | POA: Diagnosis present

## 2021-05-09 DIAGNOSIS — Z79899 Other long term (current) drug therapy: Secondary | ICD-10-CM | POA: Diagnosis not present

## 2021-05-09 DIAGNOSIS — K219 Gastro-esophageal reflux disease without esophagitis: Secondary | ICD-10-CM | POA: Diagnosis present

## 2021-05-09 DIAGNOSIS — N179 Acute kidney failure, unspecified: Secondary | ICD-10-CM | POA: Diagnosis present

## 2021-05-09 DIAGNOSIS — R0609 Other forms of dyspnea: Secondary | ICD-10-CM | POA: Diagnosis not present

## 2021-05-09 DIAGNOSIS — Z886 Allergy status to analgesic agent status: Secondary | ICD-10-CM | POA: Diagnosis not present

## 2021-05-09 DIAGNOSIS — Z96642 Presence of left artificial hip joint: Secondary | ICD-10-CM | POA: Diagnosis present

## 2021-05-09 DIAGNOSIS — F419 Anxiety disorder, unspecified: Secondary | ICD-10-CM | POA: Diagnosis present

## 2021-05-09 DIAGNOSIS — F32A Depression, unspecified: Secondary | ICD-10-CM | POA: Diagnosis present

## 2021-05-09 DIAGNOSIS — I5031 Acute diastolic (congestive) heart failure: Secondary | ICD-10-CM | POA: Diagnosis present

## 2021-05-09 DIAGNOSIS — E7800 Pure hypercholesterolemia, unspecified: Secondary | ICD-10-CM | POA: Diagnosis present

## 2021-05-09 DIAGNOSIS — M47816 Spondylosis without myelopathy or radiculopathy, lumbar region: Secondary | ICD-10-CM | POA: Diagnosis present

## 2021-05-09 DIAGNOSIS — M7989 Other specified soft tissue disorders: Secondary | ICD-10-CM | POA: Diagnosis present

## 2021-05-09 DIAGNOSIS — Z20822 Contact with and (suspected) exposure to covid-19: Secondary | ICD-10-CM | POA: Diagnosis present

## 2021-05-09 DIAGNOSIS — Z23 Encounter for immunization: Secondary | ICD-10-CM | POA: Diagnosis not present

## 2021-05-09 DIAGNOSIS — I10 Essential (primary) hypertension: Secondary | ICD-10-CM

## 2021-05-09 DIAGNOSIS — M5136 Other intervertebral disc degeneration, lumbar region: Secondary | ICD-10-CM | POA: Diagnosis present

## 2021-05-09 DIAGNOSIS — R600 Localized edema: Secondary | ICD-10-CM | POA: Diagnosis present

## 2021-05-09 DIAGNOSIS — N1831 Chronic kidney disease, stage 3a: Secondary | ICD-10-CM | POA: Diagnosis present

## 2021-05-09 DIAGNOSIS — C73 Malignant neoplasm of thyroid gland: Secondary | ICD-10-CM | POA: Diagnosis present

## 2021-05-09 DIAGNOSIS — J9601 Acute respiratory failure with hypoxia: Secondary | ICD-10-CM | POA: Diagnosis present

## 2021-05-09 DIAGNOSIS — E43 Unspecified severe protein-calorie malnutrition: Secondary | ICD-10-CM | POA: Diagnosis present

## 2021-05-09 LAB — CBC WITH DIFFERENTIAL/PLATELET
Abs Immature Granulocytes: 0.01 10*3/uL (ref 0.00–0.07)
Basophils Absolute: 0.1 10*3/uL (ref 0.0–0.1)
Basophils Relative: 1 %
Eosinophils Absolute: 0.1 10*3/uL (ref 0.0–0.5)
Eosinophils Relative: 2 %
HCT: 39.6 % (ref 36.0–46.0)
Hemoglobin: 12.5 g/dL (ref 12.0–15.0)
Immature Granulocytes: 0 %
Lymphocytes Relative: 22 %
Lymphs Abs: 1.3 10*3/uL (ref 0.7–4.0)
MCH: 26.4 pg (ref 26.0–34.0)
MCHC: 31.6 g/dL (ref 30.0–36.0)
MCV: 83.7 fL (ref 80.0–100.0)
Monocytes Absolute: 0.5 10*3/uL (ref 0.1–1.0)
Monocytes Relative: 8 %
Neutro Abs: 4 10*3/uL (ref 1.7–7.7)
Neutrophils Relative %: 67 %
Platelets: 320 10*3/uL (ref 150–400)
RBC: 4.73 MIL/uL (ref 3.87–5.11)
RDW: 19.1 % — ABNORMAL HIGH (ref 11.5–15.5)
WBC: 6 10*3/uL (ref 4.0–10.5)
nRBC: 0 % (ref 0.0–0.2)

## 2021-05-09 LAB — COMPREHENSIVE METABOLIC PANEL
ALT: 7 U/L (ref 0–44)
AST: 14 U/L — ABNORMAL LOW (ref 15–41)
Albumin: 2.2 g/dL — ABNORMAL LOW (ref 3.5–5.0)
Alkaline Phosphatase: 80 U/L (ref 38–126)
Anion gap: 3 — ABNORMAL LOW (ref 5–15)
BUN: 21 mg/dL (ref 8–23)
CO2: 24 mmol/L (ref 22–32)
Calcium: 9 mg/dL (ref 8.9–10.3)
Chloride: 111 mmol/L (ref 98–111)
Creatinine, Ser: 2.02 mg/dL — ABNORMAL HIGH (ref 0.44–1.00)
GFR, Estimated: 26 mL/min — ABNORMAL LOW (ref >60)
Glucose, Bld: 95 mg/dL (ref 70–99)
Potassium: 4 mmol/L (ref 3.5–5.1)
Sodium: 138 mmol/L (ref 135–145)
Total Bilirubin: 0.4 mg/dL (ref 0.3–1.2)
Total Protein: 6.2 g/dL — ABNORMAL LOW (ref 6.5–8.1)

## 2021-05-09 LAB — HEPATITIS PANEL, ACUTE
HCV Ab: NONREACTIVE
Hep A IgM: NONREACTIVE
Hep B C IgM: NONREACTIVE
Hepatitis B Surface Ag: NONREACTIVE

## 2021-05-09 LAB — TSH: TSH: 0.378 u[IU]/mL (ref 0.350–4.500)

## 2021-05-09 LAB — PHOSPHORUS: Phosphorus: 2.8 mg/dL (ref 2.5–4.6)

## 2021-05-09 LAB — MAGNESIUM: Magnesium: 2 mg/dL (ref 1.7–2.4)

## 2021-05-09 MED ORDER — TRAZODONE HCL 50 MG PO TABS
100.0000 mg | ORAL_TABLET | Freq: Every day | ORAL | Status: DC
  Administered 2021-05-09 – 2021-05-10 (×2): 100 mg via ORAL
  Filled 2021-05-09 (×2): qty 2

## 2021-05-09 MED ORDER — SODIUM CHLORIDE 0.9 % IV SOLN: INTRAVENOUS | Status: DC

## 2021-05-09 NOTE — Progress Notes (Addendum)
?PROGRESS NOTE ? ? ? ?Judy Chang  RWE:[id number] DOB: 06-11-1950 DOA: 05/07/2021 ?PCP: [name], MD  ? ? ? ?Brief Narrative:  ?71 y.o. BF PMHx HTN , chronic pain, seizures  ? ?Presents to the ER because of increasing lower extremity edema over the last 2 weeks.  Patient states the lower extremity also has some pain.  Has no difficulty with walking.  Denies any chest pain or shortness of breath nausea vomiting or diarrhea.  Denies using any NSAIDs.  Has been making urine. ?  ?ED Course: In the ER patient was hemodynamically stable.  Lab work show patient has worsening renal function when compared to the 1 done in 2021.  Patient's creatinine has worsened from 1.34 in January 2021 and it is around 2.6 now.  Patient is here to give her UA sample.  On exam patient has significant lower extremity edema.  CT chest was ordered which shows enlarged thyroid.  And other nonspecific findings.  Admitted for acute renal failure with lower EXTR edema for further work-up. ? ? ?Subjective: ?4/16 afebrile overnight A/O x4.  Negative SOB.  States does not use home O2, and was never short of breath.  States currently lower extremities no longer swollen and pain has resided.  States does have falling problems at home. ? ? ? ?Assessment & Plan: ?Covid vaccination; ?  ?Principal Problem: ?  ARF (acute renal failure) ([place]) ?Active Problems: ?  Benign essential HTN ?  Bilateral lower extremity edema ? ?Acute on CKD stage IIIa cause not clear. ?Lab Results  ?Component Value Date  ? CREATININE 2.36 (H) 05/08/2021  ? CREATININE 2.63 (H) 05/08/2021  ? CREATININE 1.34 (H) 02/14/2019  ? CREATININE 1.34 (H) 02/13/2019  ? CREATININE 1.52 (H) 02/12/2019  ? ?-Given that patient's previous creatinine was in 2021 unsure if this is just deteriorating kidney function or true acute on CKD stage IIIa. ?-US renal no acute finding see results below. ?-4/16 normal saline 10m/hr ? ?Essential HTN ?- Amlodipine 10 mg daily ?- Coreg 12.5 mg BID ?-  Catapres 0.2 mg ?- Hydralazine PRN ?-4/16 patient on medication consistent with CHF echocardiogram pending ? ?Bilateral lower extremity edema ?- Most likely secondary to severe malnutrition, PVD and possible CHF ?-4/16 abnormal bilateral ABI Will consult vascular surgery in a.m. ?-4/16 currently negative lower extremity edema ? ?Seizures ?- Keppra 500 mg BID ? ?Enlarged thyroid ?- CT scan consistent with malignancy see results below ?- 4/16 nodule 3 and nodule 5 will require FNA.  Consult IR in the a.m. to determine if they would obtain biopsy. ?- 4/16 TSH pending ? ?Acute respiratory failure with hypoxia ?- Patient claims negative SOB on admission however VQ scan ordered. ?- 4/16 VQ scan negative for PE ? ?Severe protein calorie malnutrition ?- 4/16 consult nutrition ?  ? ? ?Mobility Assessment (last 72 hours)   ? ? Mobility Assessment   ? ? RHersheyName 05/08/21 2142 05/08/21 2100 05/08/21 1600  ?  ?  ? Does patient have an order for bedrest or is patient medically unstable No - Continue assessment No - Continue assessment No - Continue assessment    ? What is the highest level of mobility based on the progressive mobility assessment? Level 4 (Walks with assist in room) - Balance while marching in place and cannot step forward and back - Complete Level 4 (Walks with assist in room) - Balance while marching in place and cannot step forward and back - Complete Level 4 (Walks with assist in  room) - Balance while marching in place and cannot step forward and back - Complete    ? ?  ?  ? ?  ? ? ?DVT prophylaxis:  ?Code Status:  ?Family Communication: 4/17 spoke with Margot Ables (husband) over the phone discussed plan of care all questions answered ? ? ? ? ?Dispo: The patient is from:  ?             Anticipated d/c is to:  ?             Anticipated d/c date is:  ?             Patient currently  ? ? ? ? ? ?Consultants:  ? ? ?Procedures/Significant Events:  ?4/15 CT chest W0 contrast ?. Mild right lower lobe and lingular  linear atelectasis. ?2. Markedly enlarged, heterogeneous thyroid gland. ?4/15 US renal:Chronic medical renal disease with no acute renal finding ?4/15 US abdomen [id number] hepatic parenchymal edema such as with acute ?hepatitis. No discrete liver lesion by ultrasound. Patent main ?portal vein with normal flow direction. ?  ?2. Trace ascites.  Negative gallbladder and bile ducts. ?4/15 VQ scan: Negative PE ?4/15 US thyroid ?4/15 bilateral lower extremity ABI ?Summary:  ?Right: Resting right ankle-brachial index indicates mild right lower  ?extremity arterial disease. The right toe-brachial index is abnormal.  ? ?Left: Resting left ankle-brachial index indicates mild left lower  ?extremity arterial disease. The left toe-brachial index is abnormal.  ?4/15 lower extremity bilateral Doppler: Negative for DVT ? ? ? ?I have personally reviewed and interpreted all radiology studies and my findings are as above. ? ?VENTILATOR SETTINGS: ?Nasal cannula 4/16 ?Flow 2 L/min ?SPO2 96% ? ? ?Cultures ? ? ?Antimicrobials: ? ? ? ?Devices ?  ? ?LINES / TUBES:  ? ? ? ? ?Continuous Infusions: ? ? ?Objective: ?Vitals:  ? 05/08/21 2009 05/09/21 0100 05/09/21 0400 05/09/21 0754  ?BP: 129/60 131/63 140/69 135/71  ?Pulse: 61 61 (!) 57 (!) 58  ?Resp:  '16 11 14  '$ ?Temp: 98.5 ?F (36.9 ?C) 97.9 ?F (36.6 ?C) 98 ?F (36.7 ?C)   ?TempSrc: Oral Oral Oral   ?SpO2: 93% 94% 93% 95%  ?Weight:   55.6 kg   ?Height:      ? ?No intake or output data in the 24 hours ending 05/09/21 1330 ?Filed Weights  ? 05/08/21 1600 05/09/21 0400  ?Weight: 49.7 kg 55.6 kg  ? ?Physical Exam: ? ?General: A/O x4, No acute respiratory distress, cachectic ?Eyes: negative scleral hemorrhage, negative anisocoria, negative icterus ?ENT: Negative Runny nose, negative gingival bleeding, ?Neck:  Negative scars, masses, torticollis, lymphadenopathy, JVD ?Lungs: diffuse rhonchi,without wheezes or crackles ?Cardiovascular: Regular rate and rhythm without murmur gallop or rub normal  S1 and S2 ?Abdomen: negative abdominal pain, nondistended, positive soft, bowel sounds, no rebound, no ascites, no appreciable mass ?Extremities: No significant cyanosis, clubbing, or edema bilateral lower extremities ?Skin: Negative rashes, lesions, ulcers ?Psychiatric:  Negative depression, negative anxiety, negative fatigue, negative mania  ?Central nervous system:  Cranial nerves II through XII intact, tongue/uvula midline, all extremities muscle strength 5/5, sensation intact throughout, negative dysarthria, negative expressive aphasia, negative receptive aphasia. ? ? ? ? ? ? ?Data Reviewed: Care during the described time interval was provided by me .  I have reviewed this patient's available data, including medical history, events of note, physical examination, and all test results as part of my evaluation. ? ?CBC: ?Recent Labs  ?Lab 05/07/21 ?2240 05/08/21 ?0932  ?WBC 9.7 6.1  ?NEUTROABS  6.1 4.2  ?HGB 13.1 12.8  ?HCT 41.7 40.2  ?MCV 87.6 85.5  ?PLT 323 338  ? ? ?Basic Metabolic Panel: ?Recent Labs  ?Lab 05/08/21 ?0152 05/08/21 ?0932  ?NA 140 139  ?K 4.3 4.0  ?CL 111 109  ?CO2 25 25  ?GLUCOSE 79 110*  ?BUN 22 20  ?CREATININE 2.63* 2.36*  ?CALCIUM 9.2 9.0  ?MG  --  1.9  ?PHOS  --  3.6  ? ? ?GFR: ?Estimated Creatinine Clearance: 16.7 mL/min (A) (by C-G formula based on SCr of 2.36 mg/dL (H)). ?Liver Function Tests: ?Recent Labs  ?Lab 05/08/21 ?0152 05/08/21 ?0932  ?AST 15 14*  ?ALT 9 8  ?ALKPHOS 89 85  ?BILITOT 0.6 0.5  ?PROT 6.3* 6.2*  ?ALBUMIN 2.6* 2.5*  ? ? ?No results for input(s): LIPASE, AMYLASE in the last 168 hours. ?No results for input(s): AMMONIA in the last 168 hours. ?Coagulation Profile: ?No results for input(s): INR, PROTIME in the last 168 hours. ?Cardiac Enzymes: ?No results for input(s): CKTOTAL, CKMB, CKMBINDEX, TROPONINI in the last 168 hours. ?BNP (last 3 results) ?No results for input(s): PROBNP in the last 8760 hours. ?HbA1C: ?No results for input(s): HGBA1C in the last 72 hours. ?CBG: ?No  results for input(s): GLUCAP in the last 168 hours. ?Lipid Profile: ?No results for input(s): CHOL, HDL, LDLCALC, TRIG, CHOLHDL, LDLDIRECT in the last 72 hours. ?Thyroid Function Tests: ?Recent Labs  ?

## 2021-05-10 ENCOUNTER — Inpatient Hospital Stay (HOSPITAL_COMMUNITY): Payer: Medicare Other

## 2021-05-10 DIAGNOSIS — I272 Pulmonary hypertension, unspecified: Secondary | ICD-10-CM | POA: Diagnosis present

## 2021-05-10 DIAGNOSIS — E43 Unspecified severe protein-calorie malnutrition: Secondary | ICD-10-CM | POA: Diagnosis present

## 2021-05-10 DIAGNOSIS — R0609 Other forms of dyspnea: Secondary | ICD-10-CM

## 2021-05-10 LAB — ECHOCARDIOGRAM COMPLETE
AR max vel: 2.21 cm2
AV Area VTI: 2.5 cm2
AV Area mean vel: 2.08 cm2
AV Mean grad: 3 mmHg
AV Peak grad: 6.6 mmHg
Ao pk vel: 1.28 m/s
Area-P 1/2: 3.54 cm2
Height: 61.5 in
S' Lateral: 2.8 cm
Weight: 1961.21 oz

## 2021-05-10 LAB — CBC WITH DIFFERENTIAL/PLATELET
Abs Immature Granulocytes: 0.02 10*3/uL (ref 0.00–0.07)
Basophils Absolute: 0 10*3/uL (ref 0.0–0.1)
Basophils Relative: 1 %
Eosinophils Absolute: 0.1 10*3/uL (ref 0.0–0.5)
Eosinophils Relative: 2 %
HCT: 39.1 % (ref 36.0–46.0)
Hemoglobin: 12.9 g/dL (ref 12.0–15.0)
Immature Granulocytes: 0 %
Lymphocytes Relative: 17 %
Lymphs Abs: 1.4 10*3/uL (ref 0.7–4.0)
MCH: 27.6 pg (ref 26.0–34.0)
MCHC: 33 g/dL (ref 30.0–36.0)
MCV: 83.5 fL (ref 80.0–100.0)
Monocytes Absolute: 0.9 10*3/uL (ref 0.1–1.0)
Monocytes Relative: 10 %
Neutro Abs: 5.8 10*3/uL (ref 1.7–7.7)
Neutrophils Relative %: 70 %
Platelets: 315 10*3/uL (ref 150–400)
RBC: 4.68 MIL/uL (ref 3.87–5.11)
RDW: 19.1 % — ABNORMAL HIGH (ref 11.5–15.5)
WBC: 8.3 10*3/uL (ref 4.0–10.5)
nRBC: 0 % (ref 0.0–0.2)

## 2021-05-10 LAB — COMPREHENSIVE METABOLIC PANEL
ALT: 7 U/L (ref 0–44)
AST: 12 U/L — ABNORMAL LOW (ref 15–41)
Albumin: 2.2 g/dL — ABNORMAL LOW (ref 3.5–5.0)
Alkaline Phosphatase: 84 U/L (ref 38–126)
Anion gap: 6 (ref 5–15)
BUN: 18 mg/dL (ref 8–23)
CO2: 23 mmol/L (ref 22–32)
Calcium: 9 mg/dL (ref 8.9–10.3)
Chloride: 111 mmol/L (ref 98–111)
Creatinine, Ser: 1.89 mg/dL — ABNORMAL HIGH (ref 0.44–1.00)
GFR, Estimated: 28 mL/min — ABNORMAL LOW (ref >60)
Glucose, Bld: 83 mg/dL (ref 70–99)
Potassium: 3.8 mmol/L (ref 3.5–5.1)
Sodium: 140 mmol/L (ref 135–145)
Total Bilirubin: 0.7 mg/dL (ref 0.3–1.2)
Total Protein: 5.9 g/dL — ABNORMAL LOW (ref 6.5–8.1)

## 2021-05-10 LAB — GLOMERULAR BASEMENT MEMBRANE ANTIBODIES: GBM Ab: <0.2 units (ref 0.0–0.9)

## 2021-05-10 LAB — PHOSPHORUS: Phosphorus: 2.5 mg/dL (ref 2.5–4.6)

## 2021-05-10 LAB — MAGNESIUM: Magnesium: 1.8 mg/dL (ref 1.7–2.4)

## 2021-05-10 LAB — ANA: Anti Nuclear Antibody (ANA): POSITIVE — AB

## 2021-05-10 MED ORDER — ENSURE ENLIVE PO LIQD
237.0000 mL | Freq: Two times a day (BID) | ORAL | Status: DC
  Administered 2021-05-10 – 2021-05-11 (×2): 237 mL via ORAL

## 2021-05-10 MED ORDER — ADULT MULTIVITAMIN W/MINERALS CH
1.0000 | ORAL_TABLET | Freq: Every day | ORAL | Status: DC
  Administered 2021-05-10 – 2021-05-11 (×2): 1 via ORAL
  Filled 2021-05-10 (×2): qty 1

## 2021-05-10 NOTE — Plan of Care (Signed)

## 2021-05-10 NOTE — Progress Notes (Signed)
Initial Nutrition Assessment ? ?DOCUMENTATION CODES:  ? ?Severe malnutrition in context of chronic illness ? ?INTERVENTION:  ? ?Multivitamin w/ minerals daily ?Ensure Enlive po BID, each supplement provides 350 kcal and 20 grams of protein. ?Liberalize pt diet to regular due to malnutrition ?Encourage good PO intake ?Meal ordering with assist ? ?NUTRITION DIAGNOSIS:  ? ?Severe Malnutrition related to chronic illness as evidenced by severe muscle depletion, severe fat depletion. ? ?GOAL:  ? ?Patient will meet greater than or equal to 90% of their needs ? ?MONITOR:  ? ?PO intake, Supplement acceptance, Weight trends, Labs ? ?REASON FOR ASSESSMENT:  ? ?Consult ?Assessment of nutrition requirement/status, Diet education ? ?ASSESSMENT:  ? ?71 y.o. female presented to the ED with increased BLE edema x 2 weeks. PMH includes HTN and GERD. Pt admitted with acute renal failure.  ? ?Per MD note, possible CHF, pt undergoing workup. ? ?Pt sleeping at time of RD visit, pt woke easily to RD voice.  ? ?Pt reports that she has had a poor/no appetite since her hip replacement that occurred 2-3 years ago. Pt reports that there are some days that she does not eat at all. Reports some foods that she ate at home include eggs, grits, bacon, sausage, chicken, potatoes, potato salad, and tossed salad. Pt denies any nausea or vomiting.  ? ?Pt reports that she has lost 10-15# within the past month. No weights within the past year to assess pt weight loss. Pt reports that she uses no assistance with ambulating majority of the time, but will use a cane if she leaves the home.  ? ?Discussed ONS with pt, pt reports that she was drinking 2 Ensure's per day at home. RD informed pt that they will be ordered.  ? ?Pt with no other questions or concerns at this time.  ? ?Medications reviewed.  ?Labs reviewed. ? ?NUTRITION - FOCUSED PHYSICAL EXAM: ? ?Flowsheet Row Most Recent Value  ?Orbital Region Severe depletion  ?Upper Arm Region Unable to assess   ?Thoracic and Lumbar Region Severe depletion  ?Buccal Region Severe depletion  ?Temple Region Moderate depletion  ?Clavicle Bone Region Severe depletion  ?Clavicle and Acromion Bone Region Severe depletion  ?Scapular Bone Region Severe depletion  ?Dorsal Hand Unable to assess  ?Patellar Region Severe depletion  ?Anterior Thigh Region Severe depletion  ?Posterior Calf Region Severe depletion  ?Edema (RD Assessment) None  ?Hair Reviewed  ?Eyes Reviewed  ?Mouth Reviewed  ?Skin Reviewed  ?Nails Unable to assess  ? ?Diet Order:   ?Diet Order   ? ?       ?  Diet Heart Room service appropriate? Yes; Fluid consistency: Thin  Diet effective now       ?  ? ?  ?  ? ?  ? ? ?EDUCATION NEEDS:  ? ?No education needs have been identified at this time ? ?Skin:  Skin Assessment: Reviewed RN Assessment ? ?Last BM:  4/11 ? ?Height:  ?Ht Readings from Last 1 Encounters:  ?05/08/21 5' 1.5" (1.562 m)  ? ?Weight:  ?Wt Readings from Last 1 Encounters:  ?05/09/21 55.6 kg  ? ?Ideal Body Weight:  50 kg ? ?BMI:  Body mass index is 22.79 kg/m?. ? ?Estimated Nutritional Needs:  ? ?Kcal:  1700-1900 ? ?Protein:  85-100 grams ? ?Fluid:  >/= 1.7 L ? ? ? ?Hermina Barters RD, LDN ?Clinical Dietitian ?See [facility] for contact information.  ? ?

## 2021-05-10 NOTE — Consult Note (Addendum)
Hospital Consult    Reason for Consult:  BLE swelling Requesting Physician:  Joseph Art MRN #:  [id number]  History of Present Illness: This is a 71 y.o. female who presented to the hospital with increasing leg swelling over the past couple of weeks.  Her creatinine in January 2021 was 1.34 and on admission 05/08/2021 it was 2.63 and 1.89 today.  Her BNP is also elevated.    She does have hx of CKD 3a, HTN, seizures, enlarged thyroid, acute respiratory failure with hypoxia and VQ scan negative for PE on 05/09/2021.  BLE venous duplex negative for DVT.   VVS is consulted for leg swelling/pain.  She states that she would get pain in her legs with walking but was random.  She did not describe classic claudication sx.  She has not had any non healing wounds.  She states that she was having leg swelling and pain in her legs and that is what brought her to the hospital.  She does not have any hx of varicose veins.  She states that if her legs have been up, the swelling is better.  She states that she wore some compression socks after her hip surgery several years ago.    Prior to admission: The pt is not on a statin for cholesterol management.  The pt is not on a daily aspirin.   Other AC:  is not The pt is on ARB, BB, CCB, clonidine  for hypertension.   The pt is not diabetic.   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   pt unaware   Anxiety    Arthritis    Avascular necrosis (HCC)    Chronic left femoral head   Benign essential HTN 10/23/2015   Chondromalacia 02/25/2018   Noted on MRI: Mild   DDD (degenerative disc disease), lumbar 01/10/2011   Noted on MRI   Depression    DJD (degenerative joint disease)    GERD (gastroesophageal reflux disease)    History of cataract    Bilateral   Hypercholesterolemia    Hypertension    Labial tear 02/25/2018   Noted on MRI: smal Left anterior    Lumbar spondylosis 01/10/2011    Noted on MRI   Migraines    Renal cyst 11/17/2015   Notedon [place] Abd: Simple, Right   Spinal stenosis 09/06/2016   Noted on MRI: Moderate-Severe   Vitreous floater, bilateral     Past Surgical History:  Procedure Laterality Date   COLONOSCOPY     PARTIAL HYSTERECTOMY     ROTATOR CUFF REPAIR Left    TONSILLECTOMY     TOTAL HIP ARTHROPLASTY Left 06/22/2018   Procedure: TOTAL HIP ARTHROPLASTY ANTERIOR APPROACH;  Surgeon: Jodi Geralds, MD;  Location: [facility];  Service: Orthopedics;  Laterality: Left;    Allergies  Allergen Reactions   Nsaids Rash    Prior to Admission medications   Medication Sig Start Date End Date Taking? Authorizing Provider  amLODipine (NORVASC) 10 MG tablet Take 1 tablet (10 mg total) by mouth daily. 02/18/19  Yes Meredeth Ide, MD  buPROPion (WELLBUTRIN XL) 150 MG 24 hr tablet Take 150 mg by mouth every morning. 03/12/21  Yes [provider]  carvedilol (COREG) 12.5 MG tablet Take 1 tablet (12.5 mg total) by mouth 2 (two) times daily with a meal. 02/17/19  Yes Sharl Ma, Sarina Ill, MD  cloNIDine (CATAPRES - DOSED IN MG/24 HR) 0.2 mg/24hr patch  Place 1 patch (0.2 mg total) onto the skin once a week. Patient taking differently: Place 0.2 mg onto the skin every Monday. 02/23/19  Yes Meredeth Ide, MD  DULoxetine (CYMBALTA) 30 MG capsule Take 90 mg by mouth daily. 03/12/21  Yes [provider]  levETIRAcetam (KEPPRA) 500 MG tablet Take 1 tablet (500 mg total) by mouth every 12 (twelve) hours. 02/17/19  Yes Meredeth Ide, MD  LORazepam (ATIVAN) 0.5 MG tablet Take 0.5 mg by mouth daily. 03/12/21  Yes [provider]  losartan (COZAAR) 50 MG tablet Take 1 tablet (50 mg total) by mouth daily. 02/18/19  Yes Meredeth Ide, MD  Oxycodone HCl 20 MG TABS Take 1 tablet by mouth 4 (four) times daily. 10/29/14  Yes [provider]  traZODone (DESYREL) 100 MG tablet Take 100 mg by mouth at bedtime. 03/12/21  Yes [provider]  docusate sodium  (COLACE) 100 MG capsule Take 1 capsule (100 mg total) by mouth 2 (two) times daily. Patient not taking: Reported on 05/08/2021 06/22/18   Marshia Ly, PA-C  QUEtiapine (SEROQUEL) 50 MG tablet Take 1 tablet (50 mg total) by mouth at bedtime. Patient not taking: Reported on 05/08/2021 02/17/19   Meredeth Ide, MD  tiZANidine (ZANAFLEX) 4 MG tablet Take 4 mg by mouth 3 (three) times daily as needed for muscle spasms. Patient not taking: Reported on 05/08/2021 12/11/18   [provider]    Social History   Socioeconomic History   Marital status: Married    Spouse name: Not on file   Number of children: 0   Years of education: Not on file   Highest education level: High school graduate  Occupational History   Not on file  Tobacco Use   Smoking status: Every Day    Types: Cigars   Smokeless tobacco: Never   Tobacco comments:    off and on  Vaping Use   Vaping Use: Never used  Substance and Sexual Activity   Alcohol use: Not Currently    Comment: every few days, beer   Drug use: Not Currently    Types: Marijuana    Comment: last time 06/13/2018   Sexual activity: Not on file  Other Topics Concern   Not on file  Social History Narrative   Lives with husband   Right handed   Caffeine: decaf coffee everyday, 2-3 cups   Social Determinants of Health   Financial Resource Strain: Not on file  Food Insecurity: Not on file  Transportation Needs: Not on file  Physical Activity: Not on file  Stress: Not on file  Social Connections: Not on file  Intimate Partner Violence: Not on file    Family History  Problem Relation Age of Onset   Hypertension Mother    Hypertension Father     ROS: [x]  Positive   [ ]  Negative   [ ]  All sytems reviewed and are negative  Cardiac: []  chest pain/pressure []  palpitations []  SOB lying flat []  DOE  Vascular: [x]  pain in legs while walking []  pain in legs at rest []  pain in legs at night []  non-healing ulcers []  hx of DVT [x]   swelling in legs  Pulmonary: []  asthma/wheezing []  home O2  Neurologic: []  hx of CVA []  mini stroke   Hematologic: []  hx of cancer  Endocrine:   []  diabetes []  thyroid disease  GI []  GERD  GU: [x]  CKD/renal failure []  HD--[]  M/W/F or []  T/T/S  Psychiatric: []  anxiety []  depression  Musculoskeletal: []  arthritis []  joint pain  Integumentary: []  rashes []  ulcers  Constitutional: []  fever  []  chills  Physical Examination  Vitals:   05/10/21 0731 05/10/21 0952  BP: (!) 148/81   Pulse: 62   Resp: 17   Temp: 98.1 F (36.7 C)   SpO2: 93% 94%   Body mass index is 22.79 kg/m.  General:  WDWN in NAD Gait: Not observed HENT: WNL, normocephalic Pulmonary: normal non-labored breathing Cardiac: regularwithout carotid bruits Abdomen:  soft, NT/ND; aortic pulse is not palpable Skin: without rashes Vascular Exam/Pulses:  Right Left  Radial 2+ (normal) 2+ (normal)  Femoral 2+ (normal) 2+ (normal)  Popliteal Unable to palpate Unable to palpate  DP 2+ (normal) 2+ (normal)  PT Unable to palpate Unable to palpate   Extremities: without ischemic changes, without Gangrene , without cellulitis; without open wounds; mild to no swelling in BLE today.  Musculoskeletal: no muscle wasting or atrophy  Neurologic: A&O X 3; speech is fluent/normal Psychiatric:  The pt has  flat  affect.   CBC    Component Value Date/Time   WBC 8.3 05/10/2021 0903   RBC 4.68 05/10/2021 0903   HGB 12.9 05/10/2021 0903   HCT 39.1 05/10/2021 0903   PLT 315 05/10/2021 0903   MCV 83.5 05/10/2021 0903   MCH 27.6 05/10/2021 0903   MCHC 33.0 05/10/2021 0903   RDW 19.1 (H) 05/10/2021 0903   LYMPHSABS 1.4 05/10/2021 0903   MONOABS 0.9 05/10/2021 0903   EOSABS 0.1 05/10/2021 0903   BASOSABS 0.0 05/10/2021 0903    BMET    Component Value Date/Time   NA 140 05/10/2021 0903   K 3.8 05/10/2021 0903   CL 111 05/10/2021 0903   CO2 23 05/10/2021 0903   GLUCOSE 83 05/10/2021 0903   BUN 18  05/10/2021 0903   CREATININE 1.89 (H) 05/10/2021 0903   CALCIUM 9.0 05/10/2021 0903   GFRNONAA 28 (L) 05/10/2021 0903   GFRAA 46 (L) 02/14/2019 0412    COAGS: Lab Results  Component Value Date   INR 1.0 06/14/2018   INR 0.89 10/22/2015     Non-Invasive Vascular Imaging:   ABI/TBI 05/08/2021 Right:  0.90/0.63 great toe pressure 88 Left:  0.94/0.50 great toe pressure 70  DVT study on 05/08/2021 Negative for DVT BLE   ASSESSMENT/PLAN: This is a 71 y.o. female admitted with leg swelling and pain   -pt states that since she has been in the hospital, her leg swelling and pain are much better.  She does not have classic claudication sx and she has palpable DP pulses bilaterally.   -on admission, she had elevated creatinine and BNP, which can certainly be reasons for leg swelling.  She may also have some chronic venous insufficiency.  She does not have any venous stasis ulcers.  She does not have DVT on her u/s of BLE -recommend knee high compression and elevation.     Doreatha Massed, PA-C [facility] [phone]  I have independently interviewed and examined patient and agree with PA assessment and plan above.  On my exam she actually does not have any pain or swelling.  Swelling likely multifactorial and related to recent health issues.  She has palpable dorsalis pedis pulses to suggest no arterial insufficiency to intervene upon.  Venous study without any evidence of previous DVT.  Her skin appears healthy.  As above recommend compression and elevation hopefully some of this will resolve with treatment of her underlying medical conditions.  Vascular surgery  will be available as needed.  Farida Mcreynolds C. Randie Heinz, MD [facility] [place] Office: [phone] Pager: [phone]

## 2021-05-10 NOTE — Progress Notes (Signed)
?PROGRESS NOTE ? ? ? ?Judy Chang  FIE:[id number] DOB: [birth year] DOA: 05/07/2021 ?PCP: [name], MD  ? ? ? ?Brief Narrative:  ?71 y.o. BF PMHx HTN , chronic pain, seizures  ? ?Presents to the ER because of increasing lower extremity edema over the last 2 weeks.  Patient states the lower extremity also has some pain.  Has no difficulty with walking.  Denies any chest pain or shortness of breath nausea vomiting or diarrhea.  Denies using any NSAIDs.  Has been making urine. ?  ?ED Course: In the ER patient was hemodynamically stable.  Lab work show patient has worsening renal function when compared to the 1 done in 2021.  Patient's creatinine has worsened from 1.34 in January 2021 and it is around 2.6 now.  Patient is here to give her UA sample.  On exam patient has significant lower extremity edema.  CT chest was ordered which shows enlarged thyroid.  And other nonspecific findings.  Admitted for acute renal failure with lower EXTR edema for further work-up. ? ? ?Subjective: ?4/17 A/O x4, currently negative lower extremity swelling or pain.  Negative CP, states negative SOB but has not exerted himself.  Does not have a cardiologist. ? ? ?Assessment & Plan: ?Covid vaccination; ?  ?Principal Problem: ?  ARF (acute renal failure) ([place]) ?Active Problems: ?  Benign essential HTN ?  Bilateral lower extremity edema ?  Protein-calorie malnutrition, severe ([place]) ?  Pulmonary hypertension ([place]) ? ?Acute on CKD stage IIIa cause not clear. ?Lab Results  ?Component Value Date  ? CREATININE 1.89 (H) 05/10/2021  ? CREATININE 2.02 (H) 05/09/2021  ? CREATININE 2.36 (H) 05/08/2021  ? CREATININE 2.63 (H) 05/08/2021  ? CREATININE 1.34 (H) 02/14/2019  ?-Given that patient's previous creatinine was in 2021 unsure if this is just deteriorating kidney function or true acute on CKD stage IIIa. ?-US renal no acute finding see results below. ?- 4/17 increase Normal Saline 67m/hr ? ?Acute Diastolic CHF ?- 41/66echocardiogram  consistent with CHF see results below ?- Strict in and out ?- Daily weight ?- Amlodipine 10 mg daily ?- Coreg 12.5 mg BID ?- Catapres 0.2 mg ?- Hydralazine PRN ?-4/16 Echocardiogram consistent with diastolic CHF see results below ? ?Pulmonary HTN  ?-See CHF ? ?Essential HTN ?-See CHF ? ?Bilateral lower extremity edema ?- Most likely secondary to severe malnutrition, PVD and possible CHF ?-4/16 abnormal bilateral ABI Will consult vascular surgery in a.m. ?-4/16 currently negative lower extremity edema ?-4/17 per VVS recommendations knee-high compression stockings.  No further work-up required. ? ?Seizures ?- Keppra 500 mg BID ? ?Enlarged thyroid ?- CT scan consistent with malignancy see results below ?- 4/16 nodule 3 and nodule 5 will require FNA.  Consult IR in the a.m. to determine if they would obtain biopsy. ?- 4/16 TSH WNL ?-4/17 IR recommends biopsy as outpatient.  Will schedule in A.m. prior to discharge ? ?Acute respiratory failure with hypoxia ?- Patient claims negative SOB on admission however VQ scan ordered. ?- 4/16 VQ scan negative for PE ?-4/17 ambulatory SPO2 pending ? ?Severe protein calorie malnutrition ?- 4/16 consult nutrition ?  ? ? ?Mobility Assessment (last 72 hours)   ? ? Mobility Assessment   ? ? RReganName 05/09/21 2030 05/08/21 2142 05/08/21 2100 05/08/21 1600  ?  ? Does patient have an order for bedrest or is patient medically unstable No - Continue assessment No - Continue assessment No - Continue assessment No - Continue assessment   ? What is  the highest level of mobility based on the progressive mobility assessment? Level 4 (Walks with assist in room) - Balance while marching in place and cannot step forward and back - Complete Level 4 (Walks with assist in room) - Balance while marching in place and cannot step forward and back - Complete Level 4 (Walks with assist in room) - Balance while marching in place and cannot step forward and back - Complete Level 4 (Walks with assist in room) -  Balance while marching in place and cannot step forward and back - Complete   ? ?  ?  ? ?  ? ? ?DVT prophylaxis:  ?Code Status:  ?Family Communication: 4/17 spoke with Margot Ables (husband) over the phone discussed plan of care all questions answered ? ? ? ? ?Dispo: The patient is from:  ?             Anticipated d/c is to:  ?             Anticipated d/c date is:  ?             Patient currently  ? ? ? ? ? ?Consultants:  ?VVS Dr. Danford Bad ?IR ? ? ?Procedures/Significant Events:  ?4/15 CT chest W0 contrast ?. Mild right lower lobe and lingular linear atelectasis. ?2. Markedly enlarged, heterogeneous thyroid gland. ?4/15 US renal:Chronic medical renal disease with no acute renal finding ?4/15 US abdomen [id number] hepatic parenchymal edema such as with acute ?hepatitis. No discrete liver lesion by ultrasound. Patent main ?portal vein with normal flow direction. ?  ?2. Trace ascites.  Negative gallbladder and bile ducts. ?4/15 VQ scan: Negative PE ?4/15 US thyroid ?4/15 bilateral lower extremity ABI ?Summary:  ?Right: Resting right ankle-brachial index indicates mild right lower  ?extremity arterial disease. The right toe-brachial index is abnormal.  ? ?Left: Resting left ankle-brachial index indicates mild left lower  ?extremity arterial disease. The left toe-brachial index is abnormal.  ?4/15 lower extremity bilateral Doppler: Negative for DVT ?4/16 Echocardiogram ?Left Ventricle: LVEF= 60 to 65%.  ?-Left ventricular diastolic parameters are consistent with Grade I diastolic dysfunction  ?Right Ventricle: moderately elevated pulmonary artery systolic pressure.  ?-PASP:estimated right ventricular systolic pressure is 91.4 mmHg.  ? ? ? ?I have personally reviewed and interpreted all radiology studies and my findings are as above. ? ?VENTILATOR SETTINGS: ?Nasal cannula 4/17 ?Flow 2 L/min ?SPO2 96% ? ? ?Cultures ? ? ?Antimicrobials: ? ? ? ?Devices ?  ? ?LINES / TUBES:  ? ? ? ? ?Continuous Infusions: ? sodium  chloride 50 mL/hr at 05/10/21 1325  ? ? ? ?Objective: ?Vitals:  ? 05/10/21 1307 05/10/21 1310 05/10/21 1311 05/10/21 1539  ?BP:    132/63  ?Pulse:    (!) 58  ?Resp:    15  ?Temp:    97.7 ?F (36.5 ?C)  ?TempSrc:    Oral  ?SpO2: 91% (!) 89% 95% 90%  ?Weight:      ?Height:      ? ? ?Intake/Output Summary (Last 24 hours) at 05/10/2021 1710 ?Last data filed at 05/10/2021 0346 ?Gross per 24 hour  ?Intake 306.42 ml  ?Output --  ?Net 306.42 ml  ? ?Filed Weights  ? 05/08/21 1600 05/09/21 0400  ?Weight: 49.7 kg 55.6 kg  ? ?Physical Exam: ? ?General: A/O x4, No acute respiratory distress, cachectic ?Eyes: negative scleral hemorrhage, negative anisocoria, negative icterus ?ENT: Negative Runny nose, negative gingival bleeding, ?Neck:  Negative scars, masses, torticollis, lymphadenopathy, JVD ?Lungs: diffuse rhonchi,without wheezes  or crackles ?Cardiovascular: Regular rate and rhythm without murmur gallop or rub normal S1 and S2 ?Abdomen: negative abdominal pain, nondistended, positive soft, bowel sounds, no rebound, no ascites, no appreciable mass ?Extremities: No significant cyanosis, clubbing, or edema bilateral lower extremities ?Skin: Negative rashes, lesions, ulcers ?Psychiatric:  Negative depression, negative anxiety, negative fatigue, negative mania  ?Central nervous system:  Cranial nerves II through XII intact, tongue/uvula midline, all extremities muscle strength 5/5, sensation intact throughout, negative dysarthria, negative expressive aphasia, negative receptive aphasia. ? ? ? ? ? ? ?Data Reviewed: Care during the described time interval was provided by me .  I have reviewed this patient's available data, including medical history, events of note, physical examination, and all test results as part of my evaluation. ? ?CBC: ?Recent Labs  ?Lab 05/07/21 ?2240 05/08/21 ?0932 05/09/21 ?1419 05/10/21 ?0903  ?WBC 9.7 6.1 6.0 8.3  ?NEUTROABS 6.1 4.2 4.0 5.8  ?HGB 13.1 12.8 12.5 12.9  ?HCT 41.7 40.2 39.6 39.1  ?MCV 87.6 85.5  83.7 83.5  ?PLT 323 338 320 315  ? ?Basic Metabolic Panel: ?Recent Labs  ?Lab 05/08/21 ?0152 05/08/21 ?0932 05/09/21 ?1419 05/10/21 ?0903  ?NA 140 139 138 140  ?K 4.3 4.0 4.0 3.8  ?CL 111 109 111 111  ?CO2 25 2

## 2021-05-10 NOTE — Progress Notes (Signed)
Interventional Radiology Brief Note: ? ?Patient admitted with lower extremity edema, acute renal failure who underwent CT Head and Neck in the ED which showed thyroidmegaly. This lead to further US Thyroid which showed: ?1. Nodule 3 (TI-RADS 3), measuring 2.8 cm, located in the inferior ?right thyroid lobe, meets criteria for FNA. ?2. Nodule 5 (TI-RADS 4), measuring 3.3 cm, located in the inferior ?left thyroid lobe, meets criteria for FNA. ? ?IR consulted for biopsy. Patient currently admitted with ongoing work-up for her leg swelling and pain with acute kidney injury.  Thyroid nodules are an incidental finding on CT and have been appropriately staged using TIRADs criteria and biopsy recommended.   It is the recommendation of IR that this be done as an outpatient after discharge home.  ? ?Brynda Greathouse, MS RD PA-C ? ? ?

## 2021-05-10 NOTE — Progress Notes (Signed)
Echocardiogram ?2D Echocardiogram has been performed. ? ?Judy Chang ?05/10/2021, 8:46 AM ?

## 2021-05-11 ENCOUNTER — Other Ambulatory Visit (HOSPITAL_COMMUNITY): Payer: Medicare Other

## 2021-05-11 DIAGNOSIS — I5031 Acute diastolic (congestive) heart failure: Secondary | ICD-10-CM

## 2021-05-11 DIAGNOSIS — I272 Pulmonary hypertension, unspecified: Secondary | ICD-10-CM

## 2021-05-11 DIAGNOSIS — E43 Unspecified severe protein-calorie malnutrition: Secondary | ICD-10-CM

## 2021-05-11 DIAGNOSIS — J9601 Acute respiratory failure with hypoxia: Secondary | ICD-10-CM

## 2021-05-11 LAB — PHOSPHORUS: Phosphorus: 2.5 mg/dL (ref 2.5–4.6)

## 2021-05-11 LAB — COMPREHENSIVE METABOLIC PANEL
ALT: 7 U/L (ref 0–44)
AST: 13 U/L — ABNORMAL LOW (ref 15–41)
Albumin: 2 g/dL — ABNORMAL LOW (ref 3.5–5.0)
Alkaline Phosphatase: 74 U/L (ref 38–126)
Anion gap: 7 (ref 5–15)
BUN: 17 mg/dL (ref 8–23)
CO2: 23 mmol/L (ref 22–32)
Calcium: 8.5 mg/dL — ABNORMAL LOW (ref 8.9–10.3)
Chloride: 107 mmol/L (ref 98–111)
Creatinine, Ser: 1.82 mg/dL — ABNORMAL HIGH (ref 0.44–1.00)
GFR, Estimated: 29 mL/min — ABNORMAL LOW (ref >60)
Glucose, Bld: 108 mg/dL — ABNORMAL HIGH (ref 70–99)
Potassium: 3.8 mmol/L (ref 3.5–5.1)
Sodium: 137 mmol/L (ref 135–145)
Total Bilirubin: 0.4 mg/dL (ref 0.3–1.2)
Total Protein: 5.3 g/dL — ABNORMAL LOW (ref 6.5–8.1)

## 2021-05-11 LAB — CBC WITH DIFFERENTIAL/PLATELET
Abs Immature Granulocytes: 0.03 10*3/uL (ref 0.00–0.07)
Basophils Absolute: 0 10*3/uL (ref 0.0–0.1)
Basophils Relative: 1 %
Eosinophils Absolute: 0.2 10*3/uL (ref 0.0–0.5)
Eosinophils Relative: 2 %
HCT: 36.3 % (ref 36.0–46.0)
Hemoglobin: 11.5 g/dL — ABNORMAL LOW (ref 12.0–15.0)
Immature Granulocytes: 0 %
Lymphocytes Relative: 22 %
Lymphs Abs: 1.8 10*3/uL (ref 0.7–4.0)
MCH: 27.1 pg (ref 26.0–34.0)
MCHC: 31.7 g/dL (ref 30.0–36.0)
MCV: 85.4 fL (ref 80.0–100.0)
Monocytes Absolute: 1.1 10*3/uL — ABNORMAL HIGH (ref 0.1–1.0)
Monocytes Relative: 13 %
Neutro Abs: 5.1 10*3/uL (ref 1.7–7.7)
Neutrophils Relative %: 62 %
Platelets: 262 10*3/uL (ref 150–400)
RBC: 4.25 MIL/uL (ref 3.87–5.11)
RDW: 19.3 % — ABNORMAL HIGH (ref 11.5–15.5)
WBC: 8.2 10*3/uL (ref 4.0–10.5)
nRBC: 0 % (ref 0.0–0.2)

## 2021-05-11 LAB — ANTI-MICROSOMAL ANTIBODY LIVER / KIDNEY: LKM1 Ab: 0.7 Units (ref 0.0–20.0)

## 2021-05-11 LAB — MAGNESIUM: Magnesium: 1.6 mg/dL — ABNORMAL LOW (ref 1.7–2.4)

## 2021-05-11 LAB — GLUCOSE, CAPILLARY: Glucose-Capillary: 146 mg/dL — ABNORMAL HIGH (ref 70–99)

## 2021-05-11 NOTE — TOC Initial Note (Signed)
Transition of Care (TOC) - Initial/Assessment Note  ? ? ?Patient Details  ?Name: RONIT CRANFIELD ?MRN: [id number] ?Date of Birth: 1950-12-30 ? ?Transition of Care [facility]) CM/SW Contact:    ?Ninfa Meeker, RN ?Phone Number: ?05/11/2021, 12:02 PM ? ?Clinical Narrative:   Case Manager spoke with patient concerning her need for oxygen at home. Patient understands and is agreeable to [employer] delivering oxygen. CM informed RN that saturations are needed.Patient has family support when discharged.             ? ? ?Expected Discharge Plan: Home/Self Care ?  ? ? ?Patient Goals and CMS Choice ?  ?  ?Choice offered to / list presented to : Patient ? ?Expected Discharge Plan and Services ?Expected Discharge Plan: Home/Self Care ?  ?Discharge Planning Services: CM Consult ?Post Acute Care Choice: Durable Medical Equipment ?Living arrangements for the past 2 months: Apartment ?                ?DME Arranged: Oxygen ?DME Agency: [employer] ?Date DME Agency Contacted: 05/11/21 ?Time DME Agency Contacted: 1601 ?Representative spoke with at DME Agency: Jazmine ?HH Arranged: NA ?[place] Agency: NA ?  ?  ?  ? ?Prior Living Arrangements/Services ?Living arrangements for the past 2 months: Apartment ?Lives with:: Spouse ?Patient language and need for interpreter reviewed:: Yes ?       ?Need for Family Participation in Patient Care: Yes (Comment) ?Care giver support system in place?: Yes (comment) ?  ?Criminal Activity/Legal Involvement Pertinent to Current Situation/Hospitalization: Yes - Comment as needed ? ?Activities of Daily Living ?Home Assistive Devices/Equipment: Shower chair with back, Grab bars around toilet, Grab bars in shower ?ADL Screening (condition at time of admission) ?Patient's cognitive ability adequate to safely complete daily activities?: Yes ?Is the patient deaf or have difficulty hearing?: No ?Does the patient have difficulty seeing, even when wearing glasses/contacts?: No ?Does the patient have difficulty  concentrating, remembering, or making decisions?: No ?Patient able to express need for assistance with ADLs?: Yes ?Does the patient have difficulty dressing or bathing?: Yes ?Independently performs ADLs?: Yes (appropriate for developmental age) ?Does the patient have difficulty walking or climbing stairs?: No ?Weakness of Legs: Both ?Weakness of Arms/Hands: None ? ?Permission Sought/Granted ?  ?  ?   ?   ?   ?   ? ?Emotional Assessment ?  ?  ?  ?Orientation: : Oriented to Self, Oriented to Place, Oriented to  Time, Oriented to Situation ?Alcohol / Substance Use: Not Applicable ?Psych Involvement: No (comment) ? ?Admission diagnosis:  ARF (acute renal failure) ([place]) [N17.9] ?Hypoxia [R09.02] ?AKI (acute kidney injury) ([place]) [N17.9] ?Bilateral lower extremity edema [R60.0] ?Patient Active Problem List  ? Diagnosis Date Noted  ? Protein-calorie malnutrition, severe ([place]) 05/10/2021  ? Pulmonary hypertension ([place]) 05/10/2021  ? ARF (acute renal failure) ([place]) 05/08/2021  ? Bilateral lower extremity edema 05/08/2021  ? PRES (posterior reversible encephalopathy syndrome) 04/01/2019  ? Hypertensive emergency 04/01/2019  ? Status epilepticus ([place]) 02/07/2019  ? Primary osteoarthritis of left hip 06/22/2018  ? Benign essential HTN 10/23/2015  ? 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 Chest pain, neg MI, neg for pul. embolus, normal ETT 10/22/2015  ? ?PCP:  [name], MD ?Pharmacy:   ?[id number] [place] [id number]?Phone: [phone]: [phone]? ?[facility] [place] [address] ?[address] ?[place]?  Phone: [phone] Fax: [phone] ? ?[facility] ?[address] ?[place] [address] ?Phone: [phone] Fax: [phone] ? ? ? ? ?Social Determinants of Health (SDOH) Interventions ?  ? ?Readmission Risk Interventions ?   ?  View : No data to display.  ?  ?  ?  ? ? ? ?

## 2021-05-11 NOTE — Progress Notes (Signed)
SATURATION QUALIFICATIONS: (This note is used to comply with regulatory documentation for home oxygen)  Patient Saturations on Room Air at Rest = 94%  Patient Saturations on Room Air while Ambulating = 86%  Patient Saturations on 1 Liters of oxygen while Ambulating = 92%  Please briefly explain why patient needs home oxygen: 

## 2021-05-11 NOTE — Plan of Care (Signed)

## 2021-05-11 NOTE — Discharge Summary (Addendum)
Physician Discharge Summary  ?Judy Chang AJO:[id number] DOB: 12-20-1950 DOA: 05/07/2021 ? ?PCP: Judy Frees, MD ? ?Admit date: 05/07/2021 ?Discharge date: 05/11/2021 ? ?Time spent: 35 minutes ? ?Recommendations for Outpatient Follow-up:  ? ?Acute on CKD stage IIIa cause not clear ?Lab Results  ?Component Value Date  ? CREATININE 1.82 (H) 05/11/2021  ? CREATININE 1.89 (H) 05/10/2021  ? CREATININE 2.02 (H) 05/09/2021  ? CREATININE 2.36 (H) 05/08/2021  ? CREATININE 2.63 (H) 05/08/2021  ?-Given that patient's previous creatinine was in 2021 unsure if this is just deteriorating kidney function or true acute on CKD stage IIIa. ?-US renal no acute finding see results below. ?- 4/17 increase Normal Saline 50m/hr ?-We will need to see her PCP within 1 week to check her renal function that is continuing to improve.  Would recommend he refer her to a nephrologist. ? ?Acute Diastolic CHF ?- 49/47echocardiogram consistent with CHF see results below ?- Strict in and out ?- Daily weight ?- Amlodipine 10 mg daily ?- Coreg 12.5 mg BID ?- Catapres 0.2 mg ?- Hydralazine PRN ?-4/16 Echocardiogram consistent with diastolic CHF see results below ?-Establish care appointment [name]  ?  ?Pulmonary HTN  ?-See CHF ?  ?Essential HTN ?-See CHF ? ?Bilateral lower extremity edema ?- Most likely secondary to severe malnutrition, PVD and possible CHF ?-4/16 abnormal bilateral ABI Will consult vascular surgery in a.m. ?-4/16 currently negative lower extremity edema ?-4/17 per VVS recommendations knee-high compression stockings.  No further work-up required. ?  ?Seizures ?- Keppra 500 mg BID ?  ?Enlarged thyroid ?- CT scan consistent with malignancy see results below ?- 4/16 nodule 3 and nodule 5 will require FNA.  Consult IR in the a.m. to determine if they would obtain biopsy. ?- 4/16 TSH WNL ?-4/17 IR recommends biopsy as outpatient.  Will schedule in A.m. prior to discharge ?  ?Acute respiratory failure  with hypoxia ?- Patient claims negative SOB on admission however VQ scan ordered. ?- 4/16 VQ scan negative for PE ?-4/17 ambulatory SPO2 pending ?SATURATION QUALIFICATIONS: (This note is used to comply with regulatory documentation for home oxygen) ?Patient Saturations on Room Air at Rest = 94% ?Patient Saturations on Room Air while Ambulating = 86% ?Patient Saturations on 1 Liters of oxygen while Ambulating = 92% ?Please briefly explain why patient needs home oxygen: ?-Patient meets criteria for home O2 ?- 1 L O2 via nasal cannula titrate to maintain SPO2> 92% ?-Provide Inogen home O2 concentrator ?  ?Severe protein calorie malnutrition ?- 4/16 consult nutrition ? ?Discharge Diagnoses:  ?Principal Problem: ?  ARF (acute renal failure) ([place] ?Active Problems: ?  Benign essential HTN ?  Bilateral lower extremity edema ?  Protein-calorie malnutrition, severe ([place] ?  Pulmonary hypertension ([place] ? ? ?Discharge Condition: Stable ? ?Diet recommendation: Heart healthy ? ?Filed Weights  ? 05/08/21 1600 05/09/21 0400  ?Weight: 49.7 kg 55.6 kg  ? ? ?History of present illness:  ?71y.o. BF PMHx HTN , chronic pain, seizures  ?  ?Presents to the ER because of increasing lower extremity edema over the last 2 weeks.  Patient states the lower extremity also has some pain.  Has no difficulty with walking.  Denies any chest pain or shortness of breath nausea vomiting or diarrhea.  Denies using any NSAIDs.  Has been making urine. ?  ?ED Course: In the ER patient was hemodynamically stable.  Lab work show patient has worsening renal function when compared to the 1  done in 2021.  Patient's creatinine has worsened from 1.34 in January 2021 and it is around 2.6 now.  Patient is here to give her UA sample.  On exam patient has significant lower extremity edema.  CT chest was ordered which shows enlarged thyroid.  And other nonspecific findings.  Admitted for acute renal failure with lower EXTR edema for further work-up. ? ?Hospital  Course:  ?See above ? ?Procedures: ?4/15 CT chest W0 contrast ?. Mild right lower lobe and lingular linear atelectasis. ?2. Markedly enlarged, heterogeneous thyroid gland. ?4/15 US renal:Chronic medical renal disease with no acute renal finding ?4/15 US abdomen IRS:[id number] hepatic parenchymal edema such as with acute ?hepatitis. No discrete liver lesion by ultrasound. Patent main ?portal vein with normal flow direction. ?  ?2. Trace ascites.  Negative gallbladder and bile ducts. ?4/15 VQ scan: Negative PE ?4/15 US thyroid ?4/15 bilateral lower extremity ABI ?Summary:  ?Right: Resting right ankle-brachial index indicates mild right lower  ?extremity arterial disease. The right toe-brachial index is abnormal.  ? ?Left: Resting left ankle-brachial index indicates mild left lower  ?extremity arterial disease. The left toe-brachial index is abnormal.  ?4/15 lower extremity bilateral Doppler: Negative for DVT ? ?Consultations: ?[facility] Dr. Danford Bad ? ? ? ?Discharge Exam: ?Vitals:  ? 05/10/21 2103 05/11/21 0000 05/11/21 0400 05/11/21 0812  ?BP: 135/65 (!) 116/56 (!) 117/57 (!) 146/68  ?Pulse: (!) 55 (!) 51 (!) 51 68  ?Resp: '15 13 10 18  '$ ?Temp: 98.4 ?F (36.9 ?C) 98.4 ?F (36.9 ?C) 97.9 ?F (36.6 ?C) 98 ?F (36.7 ?C)  ?TempSrc: Oral Oral Oral Oral  ?SpO2: 94% 100% 97% 96%  ?Weight:      ?Height:      ? ? ?General: A/O x4, No acute respiratory distress, cachectic ?Eyes: negative scleral hemorrhage, negative anisocoria, negative icterus ?ENT: Negative Runny nose, negative gingival bleeding, ?Neck:  Negative scars, masses, torticollis, lymphadenopathy, JVD ?Lungs clear to auscultation bilateral without wheezes or crackles ?Cardiovascular: Regular rate and rhythm without murmur gallop or rub normal S1 and S2 ? ?Discharge Instructions ? ? ?Allergies as of 05/11/2021   ? ?   Reactions  ? Nsaids Rash  ? ?  ? ?  ?Medication List  ?  ? ?STOP taking these medications   ? ?docusate sodium 100 MG capsule ?Commonly known as: Colace ?   ?losartan 50 MG tablet ?Commonly known as: COZAAR ?  ?QUEtiapine 50 MG tablet ?Commonly known as: SEROQUEL ?  ?tiZANidine 4 MG tablet ?Commonly known as: ZANAFLEX ?  ? ?  ? ?TAKE these medications   ? ?amLODipine 10 MG tablet ?Commonly known as: NORVASC ?Take 1 tablet (10 mg total) by mouth daily. ?  ?buPROPion 150 MG 24 hr tablet ?Commonly known as: WELLBUTRIN XL ?Take 150 mg by mouth every morning. ?  ?carvedilol 12.5 MG tablet ?Commonly known as: COREG ?Take 1 tablet (12.5 mg total) by mouth 2 (two) times daily with a meal. ?  ?cloNIDine 0.2 mg/24hr patch ?Commonly known as: CATAPRES - Dosed in mg/24 hr ?Place 1 patch (0.2 mg total) onto the skin once a week. ?What changed: when to take this ?  ?DULoxetine 30 MG capsule ?Commonly known as: CYMBALTA ?Take 90 mg by mouth daily. ?  ?levETIRAcetam 500 MG tablet ?Commonly known as: KEPPRA ?Take 1 tablet (500 mg total) by mouth every 12 (twelve) hours. ?  ?LORazepam 0.5 MG tablet ?Commonly known as: ATIVAN ?Take 0.5 mg by mouth daily. ?  ?Oxycodone HCl 20 MG Tabs ?Take 1  tablet by mouth 4 (four) times daily. ?  ?traZODone 100 MG tablet ?Commonly known as: DESYREL ?Take 100 mg by mouth at bedtime. ?  ? ?  ? ?Allergies  ?Allergen Reactions  ? Nsaids Rash  ? ? ? ? ?The results of significant diagnostics from this hospitalization (including imaging, microbiology, ancillary and laboratory) are listed below for reference.   ? ?Significant Diagnostic Studies: ?DG Chest 2 View ? ?Result Date: 05/07/2021 ?CLINICAL DATA:  Hypoxia and shortness of breath. EXAM: CHEST - 2 VIEW COMPARISON:  Radiograph 06/14/2018, CT 10/22/2015 FINDINGS: The heart is normal in size. Stable mediastinal contours with aortic atherosclerosis. Chronic interstitial coarsening and mild scarring at the right lung base. There is new patchy opacity at the left lung base. No pleural effusion or pneumothorax. No acute osseous abnormalities. IMPRESSION: 1. New patchy opacity at the left lung base, suspicious for  pneumonia. 2. Background interstitial coarsening and right basilar scarring likely related to smoking. Electronically Signed   By: Keith Rake M.D.   On: 05/07/2021 23:42  ? ?CT Chest Wo Contrast ? ?Resul

## 2021-05-11 NOTE — Progress Notes (Signed)
Awaiting pt's home O2 to be delivered before pt can be discharged.  ?

## 2021-05-11 NOTE — Progress Notes (Signed)
Pt home O2 has been delivered.  ? ?Discharge paperwork reviewed with pt. Pt verbalized understanding. Pt alert and oriented x4 in no acute distress upon discharge. Pt's husband will transport pt home via private vehicle. Pt has taken her belongings and home O2 with her.  ?

## 2021-05-12 LAB — C3 COMPLEMENT: C3 Complement: 141 mg/dL (ref 82–167)

## 2021-05-12 LAB — C4 COMPLEMENT: Complement C4, Body Fluid: 33 mg/dL (ref 12–38)

## 2021-05-13 DIAGNOSIS — I272 Pulmonary hypertension, unspecified: Secondary | ICD-10-CM | POA: Diagnosis not present

## 2021-05-13 DIAGNOSIS — I161 Hypertensive emergency: Secondary | ICD-10-CM | POA: Diagnosis not present

## 2021-05-13 DIAGNOSIS — E43 Unspecified severe protein-calorie malnutrition: Secondary | ICD-10-CM | POA: Diagnosis not present

## 2021-05-17 NOTE — Progress Notes (Deleted)
Cardiology Office Note:    Date:  05/17/2021   ID:  Judy Chang, DOB 1951-01-10, MRN [id number]  PCP:  [name], MD   [facility] Providers Cardiologist:  Lenna Sciara, MD Referring MD: [name], MD   Chief Complaint/Reason for Referral: Hospital follow-up for acute diastolic heart failure  ASSESSMENT:    1. Chronic diastolic heart failure ([place])   2. CKD (chronic kidney disease) stage 4, GFR 15-29 ml/min (HCC)   3. Aortic atherosclerosis ([place])   4. Benign essential HTN   5. Hyperlipidemia LDL goal <70     PLAN:    In order of problems listed above: 1.  Chronic diastolic heart failure: The patient's GFR is above 20.  We will start Jardiance 10 mg daily and monitor GFR with BMP next week.  If GFR drops under 20 we will need to consider stopping this medication.,  Can continue carvedilol, and amlodipine.  Due to chronic kidney disease left to think carefully about an ARB in this patient.  Follow-up in 3 months or earlier if needed. 2.  Chronic kidney disease: We will consider starting low-dose ARB next time I see here for renal protection.  She does have a lower GFR at this point. 3.  Aortic atherosclerosis: Start aspirin 81 mg and atorvastatin 40 mg daily. 4.  Hypertension:*** 5.  Hyperlipidemia: We will start atorvastatin as detailed above and check lipid panel, LP(a), LFTs in 2 months.         {Are you ordering a CV Procedure (e.g. stress test, cath, DCCV, TEE, etc)?   Press F2        :[id number]}   Dispo:  No follow-ups on file.      Medication Adjustments/Labs and Tests Ordered: Current medicines are reviewed at length with the patient today.  Concerns regarding medicines are outlined above.  The following changes have been made:  {PLAN; NO CHANGE:13088:s}   Labs/tests ordered: No orders of the defined types were placed in this encounter.   Medication Changes: No orders of the defined types were placed in this encounter.    Current  medicines are reviewed at length with the patient today.  The patient {ACTIONS; HAS/DOES NOT HAVE:19233} concerns regarding medicines.   History of Present Illness:    FOCUSED PROBLEM LIST:   1.  Diastolic dysfunction 2.  Aortic atherosclerosis on chest CT 2023 3.  CKD stage IV with a GFR of 29 and a creatinine of 1.8   The patient is a 71 y.o. female with the indicated medical history here for hospital follow-up.  The patient presented to [facility] earlier this month with complaints of lower extremity edema.  She denied any shortness of breath or chest pain.  She was found to have a worsening creatinine.  Lower extremity Dopplers were negative.  An echocardiogram was done which showed an ejection fraction of 60 to 65% with grade 1 diastolic dysfunction.  The estimated right ventricular systolic pressure was 58.0.  No left ventricular hypertrophy was seen.  No significant valvular abnormalities were detected.        Current Medications: No outpatient medications have been marked as taking for the 05/19/21 encounter (Appointment) with Early Osmond, MD.     Allergies:    Nsaids   Social History:   Social History   Tobacco Use   Smoking status: Every Day    Types: Cigars   Smokeless tobacco: Never   Tobacco comments:    off and on  Vaping Use  Vaping Use: Never used  Substance Use Topics   Alcohol use: Not Currently    Comment: every few days, beer   Drug use: Not Currently    Types: Marijuana    Comment: last time 06/13/2018     Family Hx: Family History  Problem Relation Age of Onset   Hypertension Mother    Hypertension Father      Review of Systems:   Please see the history of present illness.    All other systems reviewed and are negative.     EKGs/Labs/Other Test Reviewed:    EKG:  EKG performed April 2023 that I personally reviewed demonstrates sinus rhythm with nonspecific ST and T wave changes; EKG performed today that I personally reviewed  demonstrates ***.  Prior CV studies:  Echocardiogram 2023 with an ejection fraction of 60 to 59%, grade 1 diastolic dysfunction, no left ventricular hypertrophy, no significant valvular abnormalities, and normally sized atria.  Other studies Reviewed: Review of the additional studies/records demonstrates: Chest CT 2023 with aortic atherosclerosis  Recent Labs: 05/07/2021: B Natriuretic Peptide 373.9 05/09/2021: TSH 0.378 05/11/2021: ALT 7; BUN 17; Creatinine, Ser 1.82; Hemoglobin 11.5; Magnesium 1.6; Platelets 262; Potassium 3.8; Sodium 137   Recent Lipid Panel Lab Results  Component Value Date/Time   CHOL 208 (H) 10/22/2015 09:03 PM   TRIG 163 (H) 02/09/2019 03:42 AM   HDL 102 10/22/2015 09:03 PM   LDLCALC 93 10/22/2015 09:03 PM    Risk Assessment/Calculations:    {Does this patient have ATRIAL FIBRILLATION?:[id number]}      Physical Exam:    VS:  There were no vitals taken for this visit.   Wt Readings from Last 3 Encounters:  05/09/21 122 lb 9.2 oz (55.6 kg)  04/01/19 108 lb (49 kg)  02/17/19 119 lb 0.8 oz (54 kg)    GENERAL:  No apparent distress, AOx3 HEENT:  No carotid bruits, +2 carotid impulses, no scleral icterus CAR: RRR Irregular RR*** no murmurs***, gallops, rubs, or thrills RES:  Clear to auscultation bilaterally ABD:  Soft, nontender, nondistended, positive bowel sounds x 4 VASC:  +2 radial pulses, +2 carotid pulses, palpable pedal pulses NEURO:  CN 2-12 grossly intact; motor and sensory grossly intact PSYCH:  No active depression or anxiety EXT:  No edema, ecchymosis, or cyanosis  Signed, Early Osmond, MD  05/17/2021 10:44 AM    [address], [place], [place]  [address] Phone: [phone]; Fax: [phone]   Note:  This document was prepared using Dragon voice recognition software and may include unintentional dictation errors.

## 2021-05-19 ENCOUNTER — Ambulatory Visit: Payer: Medicare Other | Admitting: Internal Medicine

## 2021-05-19 DIAGNOSIS — I10 Essential (primary) hypertension: Secondary | ICD-10-CM

## 2021-05-19 DIAGNOSIS — E785 Hyperlipidemia, unspecified: Secondary | ICD-10-CM

## 2021-05-19 DIAGNOSIS — I700 Atherosclerosis of aorta: Secondary | ICD-10-CM

## 2021-05-19 DIAGNOSIS — N184 Chronic kidney disease, stage 4 (severe): Secondary | ICD-10-CM

## 2021-05-19 DIAGNOSIS — I5032 Chronic diastolic (congestive) heart failure: Secondary | ICD-10-CM

## 2021-05-21 DIAGNOSIS — N183 Chronic kidney disease, stage 3 unspecified: Secondary | ICD-10-CM | POA: Diagnosis not present

## 2021-05-21 DIAGNOSIS — I10 Essential (primary) hypertension: Secondary | ICD-10-CM | POA: Diagnosis not present

## 2021-05-21 DIAGNOSIS — M10072 Idiopathic gout, left ankle and foot: Secondary | ICD-10-CM | POA: Diagnosis not present

## 2021-05-21 DIAGNOSIS — F5101 Primary insomnia: Secondary | ICD-10-CM | POA: Diagnosis not present

## 2021-05-21 DIAGNOSIS — G894 Chronic pain syndrome: Secondary | ICD-10-CM | POA: Diagnosis not present

## 2021-05-21 DIAGNOSIS — I5189 Other ill-defined heart diseases: Secondary | ICD-10-CM | POA: Diagnosis not present

## 2021-05-21 DIAGNOSIS — E7800 Pure hypercholesterolemia, unspecified: Secondary | ICD-10-CM | POA: Diagnosis not present

## 2021-05-24 DIAGNOSIS — M10072 Idiopathic gout, left ankle and foot: Secondary | ICD-10-CM | POA: Diagnosis not present

## 2021-05-24 DIAGNOSIS — N183 Chronic kidney disease, stage 3 unspecified: Secondary | ICD-10-CM | POA: Diagnosis not present

## 2021-06-01 ENCOUNTER — Telehealth: Payer: Self-pay

## 2021-06-01 NOTE — Telephone Encounter (Signed)
NOTES SCANNED TO REFERRAL 

## 2021-06-10 DIAGNOSIS — I272 Pulmonary hypertension, unspecified: Secondary | ICD-10-CM | POA: Diagnosis not present

## 2021-06-10 DIAGNOSIS — E43 Unspecified severe protein-calorie malnutrition: Secondary | ICD-10-CM | POA: Diagnosis not present

## 2021-06-10 DIAGNOSIS — I161 Hypertensive emergency: Secondary | ICD-10-CM | POA: Diagnosis not present

## 2021-06-12 DIAGNOSIS — I161 Hypertensive emergency: Secondary | ICD-10-CM | POA: Diagnosis not present

## 2021-06-12 DIAGNOSIS — I272 Pulmonary hypertension, unspecified: Secondary | ICD-10-CM | POA: Diagnosis not present

## 2021-06-12 DIAGNOSIS — E43 Unspecified severe protein-calorie malnutrition: Secondary | ICD-10-CM | POA: Diagnosis not present

## 2021-06-23 NOTE — Progress Notes (Signed)
Cardiology Office Note:    Date:  06/24/2021   ID:  BITHA FAUTEUX, DOB [birth year], MRN [id number]  PCP:  [name], MD  Cardiologist:  Sinclair Grooms, MD   Referring MD: [name], MD   No chief complaint on file.   History of Present Illness:    Judy Chang is a 71 y.o. female with a hx of primary hypertension, hyperlipidemia, adrenal adenoma, protein calorie malnutrition, and diastolic dysfunction on echocardiogram who was admitted by [facility] with bilateral LE edema.   Admitted with bilateral lower extremity edema/gout and hypoxia May 07, 2021.  Echocardiography demonstrated normal systolic left ventricular function as well as normal right heart size and function.  The patient was noted to have CKD stage IV on admission with creatinine of 2.63.  Medical therapy including diuresis led to improvement in creatinine.  Albumin was 3.1 when admitted to the hospital.  There was no obvious congestion in the lungs on chest x-ray.  BNP on admission was 379.  The patient does not remember much about her hospital stay.  Her husband Fritz Pickerel is a patient that I have taken care of for 25 years who has systolic heart failure.  She is taking furosemide as needed.  She has continued bilateral lower extremity swelling although it has not worsened since discharge from the hospital.  She is on chronic oxygen therapy.  She no longer smokes cigarettes.  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   pt unaware   Anxiety    Arthritis    Avascular necrosis ([place])    Chronic left femoral head   Benign essential HTN 10/23/2015   Chondromalacia 02/25/2018   Noted on MRI: Mild   DDD (degenerative disc disease), lumbar 01/10/2011   Noted on MRI   Depression    DJD (degenerative joint disease)    GERD (gastroesophageal reflux disease)    History of cataract    Bilateral   Hypercholesterolemia    Hypertension    Labial tear 02/25/2018   Noted on  MRI: smal Left anterior    Lumbar spondylosis 01/10/2011   Noted on MRI   Migraines    Renal cyst 11/17/2015   Notedon [place] Abd: Simple, Right   Spinal stenosis 09/06/2016   Noted on MRI: Moderate-Severe   Vitreous floater, bilateral     Past Surgical History:  Procedure Laterality Date   COLONOSCOPY     PARTIAL HYSTERECTOMY     ROTATOR CUFF REPAIR Left    TONSILLECTOMY     TOTAL HIP ARTHROPLASTY Left 06/22/2018   Procedure: TOTAL HIP ARTHROPLASTY ANTERIOR APPROACH;  Surgeon: Dorna Leitz, MD;  Location: [facility];  Service: Orthopedics;  Laterality: Left;    Current Medications: Current Meds  Medication Sig   amLODipine (NORVASC) 5 MG tablet Take 1 tablet (5 mg total) by mouth daily.   buPROPion (WELLBUTRIN XL) 150 MG 24 hr tablet Take 150 mg by mouth every morning.   carvedilol (COREG) 12.5 MG tablet Take 1 tablet (12.5 mg total) by mouth 2 (two) times daily with a meal.   cloNIDine (CATAPRES - DOSED IN MG/24 HR) 0.2 mg/24hr patch Place 1 patch (0.2 mg total) onto the skin once a week. (Patient taking differently: Place 0.2 mg onto the skin every Monday.)   DULoxetine (CYMBALTA) 30 MG capsule Take 90 mg by mouth daily.   [START ON 06/25/2021] furosemide (LASIX) 20 MG tablet Take 1 tablet (20 mg total) by mouth 3 (  three) times a week. Take on Mondays, Wednesdays, Fridays.   levETIRAcetam (KEPPRA) 500 MG tablet Take 1 tablet (500 mg total) by mouth every 12 (twelve) hours.   LORazepam (ATIVAN) 0.5 MG tablet Take 0.5 mg by mouth daily.   Oxycodone HCl 20 MG TABS Take 1 tablet by mouth 4 (four) times daily.   traZODone (DESYREL) 100 MG tablet Take 100 mg by mouth at bedtime.   [DISCONTINUED] amLODipine (NORVASC) 10 MG tablet Take 1 tablet (10 mg total) by mouth daily.   [DISCONTINUED] furosemide (LASIX) 20 MG tablet Take 20 mg by mouth daily as needed.     Allergies:   Celecoxib, Citalopram hydrobromide, Ibuprofen, Pregabalin, Rofecoxib, and Nsaids   Social History   Socioeconomic  History   Marital status: Married    Spouse name: Not on file   Number of children: 0   Years of education: Not on file   Highest education level: High school graduate  Occupational History   Not on file  Tobacco Use   Smoking status: Every Day    Types: Cigars   Smokeless tobacco: Never   Tobacco comments:    off and on  Vaping Use   Vaping Use: Never used  Substance and Sexual Activity   Alcohol use: Not Currently    Comment: every few days, beer   Drug use: Not Currently    Types: Marijuana    Comment: last time 06/13/2018   Sexual activity: Not on file  Other Topics Concern   Not on file  Social History Narrative   Lives with husband   Right handed   Caffeine: decaf coffee everyday, 2-3 cups   Social Determinants of Health   Financial Resource Strain: Not on file  Food Insecurity: Not on file  Transportation Needs: Not on file  Physical Activity: Not on file  Stress: Not on file  Social Connections: Not on file     Family History: The patient's family history includes Hypertension in her father and mother.  ROS:   Please see the history of present illness.    Petite in size.  Good appetite.  Wearing a portable oxygen concentrator which stopped working during the office visit.  All other systems reviewed and are negative.  EKGs/Labs/Other Studies Reviewed:    The following studies were reviewed today: ECHOCARDIOGRAM 05/10/2021: IMPRESSIONS   1. Left ventricular ejection fraction, by estimation, is 60 to 65%. The  left ventricle has normal function. The left ventricle has no regional  wall motion abnormalities. Left ventricular diastolic parameters are  consistent with Grade I diastolic  dysfunction (impaired relaxation).   2. Right ventricular systolic function is normal. The right ventricular  size is normal. There is moderately elevated pulmonary artery systolic  pressure. The estimated right ventricular systolic pressure is 32.9 mmHg.   3. The mitral  valve is normal in structure. No evidence of mitral valve  regurgitation. No evidence of mitral stenosis.   4. The aortic valve is tricuspid. Aortic valve regurgitation is not  visualized. No aortic stenosis is present.   5. The inferior vena cava is normal in size with <50% respiratory  variability, suggesting right atrial pressure of 8 mmHg.   EKG:  EKG not performed  Recent Labs: 05/07/2021: B Natriuretic Peptide 373.9 05/09/2021: TSH 0.378 05/11/2021: ALT 7; BUN 17; Creatinine, Ser 1.82; Hemoglobin 11.5; Magnesium 1.6; Platelets 262; Potassium 3.8; Sodium 137  Recent Lipid Panel    Component Value Date/Time   CHOL 208 (H) 10/22/2015 2103  TRIG 163 (H) 02/09/2019 0342   HDL 102 10/22/2015 2103   CHOLHDL 2.0 10/22/2015 2103   VLDL 13 10/22/2015 2103   LDLCALC 93 10/22/2015 2103    Physical Exam:    VS:  BP (!) 168/60   Pulse (!) 56   Ht 5' 1.5" (1.562 m)   Wt 106 lb 6.4 oz (48.3 kg)   BMI 19.78 kg/m     Wt Readings from Last 3 Encounters:  06/24/21 106 lb 6.4 oz (48.3 kg)  05/09/21 122 lb 9.2 oz (55.6 kg)  04/01/19 108 lb (49 kg)     GEN: Wearing a mask and nasal cannula. No acute distress HEENT: Normal NECK: No JVD. LYMPHATICS: No lymphadenopathy CARDIAC: No murmur. RRR no gallop, with bilateral 2+ pedal to mid shin edema. VASCULAR:  Normal Pulses. No bruits. RESPIRATORY:  Clear to auscultation without rales, wheezing or rhonchi  ABDOMEN: Soft, non-tender, non-distended, No pulsatile mass, MUSCULOSKELETAL: No deformity  SKIN: Warm and dry NEUROLOGIC:  Alert and oriented x 3 PSYCHIATRIC:  Normal affect   ASSESSMENT:    1. Grade I diastolic dysfunction   2. Primary hypertension   3. Hypoxia   4. Pulmonary hypertension ([place])   5. PRES (posterior reversible encephalopathy syndrome)   6. Acute renal failure superimposed on stage 3 chronic kidney disease, unspecified acute renal failure type, unspecified whether stage 3a or 3b CKD ([place])   7. Bilateral lower  extremity edema    PLAN:    In order of problems listed above:  Use furosemide 20 mg on Monday Wednesday and Friday. Blood pressure is currently under excellent control on Catapres and carvedilol.  Amlodipine is also being used. Etiology is uncertain.  It continue O2 therapy.  Consider pulmonary consultation. Secondary to hypoxia and prior smoking. Neurological injury from several years ago. CKD stage III or worse with low albumin raises question of nephrotic syndrome.  Needs nephrology consultation. Decrease amlodipine to 5 mg daily.  If need be, we may discontinue this medication.  Take furosemide 20 mg on Monday Wednesday and Friday.   Lower extremity edema is multifactorial including kidney impairment with volume retention and possible nephrotic syndrome; hypoxia with pulmonary hypertension; and high-dose amlodipine.  2 to 66-monthfollow-up.  We will need to have a basic metabolic panel in 3 months.   Medication Adjustments/Labs and Tests Ordered: Current medicines are reviewed at length with the patient today.  Concerns regarding medicines are outlined above.  Orders Placed This Encounter  Procedures   Ambulatory referral to Nephrology   Meds ordered this encounter  Medications   furosemide (LASIX) 20 MG tablet    Sig: Take 1 tablet (20 mg total) by mouth 3 (three) times a week. Take on Mondays, Wednesdays, Fridays.    Dispense:  40 tablet    Refill:  3    Dose change   amLODipine (NORVASC) 5 MG tablet    Sig: Take 1 tablet (5 mg total) by mouth daily.    Dispense:  90 tablet    Refill:  3    Dose change    Patient Instructions  Medication Instructions:  Your physician has recommended you make the following change in your medication:   1) CHANGE Furosemide (Lasix) to '20mg'$  three times a week on Mondays, Wednesdays, Fridays. 2) DECREASE Amlodipine to '5mg'$  daily  *If you need a refill on your cardiac medications before your next appointment, please call your  pharmacy*  Lab Work: NONE  Testing/Procedures: NONE  Follow-Up: At [facility] you  and your health needs are our priority.  As part of our continuing mission to provide you with exceptional heart care, we have created designated Provider Care Teams.  These Care Teams include your primary Cardiologist (physician) and Advanced Practice Providers (APPs -  Physician Assistants and Nurse Practitioners) who all work together to provide you with the care you need, when you need it.  Your next appointment:   3 month  The format for your next appointment:   In Person  Provider:   Sinclair Grooms, MD {  Other Instructions Use your oxygen at all times.  Important Information About Sugar         Signed, Sinclair Grooms, MD  06/24/2021 12:02 PM    [facility]

## 2021-06-24 ENCOUNTER — Encounter: Payer: Self-pay | Admitting: Interventional Cardiology

## 2021-06-24 ENCOUNTER — Ambulatory Visit: Payer: Medicare Other | Admitting: Interventional Cardiology

## 2021-06-24 VITALS — BP 168/60 | HR 56 | Ht 61.5 in | Wt 106.4 lb

## 2021-06-24 DIAGNOSIS — I6783 Posterior reversible encephalopathy syndrome: Secondary | ICD-10-CM | POA: Diagnosis not present

## 2021-06-24 DIAGNOSIS — I5189 Other ill-defined heart diseases: Secondary | ICD-10-CM | POA: Diagnosis not present

## 2021-06-24 DIAGNOSIS — N179 Acute kidney failure, unspecified: Secondary | ICD-10-CM

## 2021-06-24 DIAGNOSIS — N183 Chronic kidney disease, stage 3 unspecified: Secondary | ICD-10-CM | POA: Diagnosis not present

## 2021-06-24 DIAGNOSIS — R600 Localized edema: Secondary | ICD-10-CM | POA: Diagnosis not present

## 2021-06-24 DIAGNOSIS — I10 Essential (primary) hypertension: Secondary | ICD-10-CM | POA: Diagnosis not present

## 2021-06-24 DIAGNOSIS — I272 Pulmonary hypertension, unspecified: Secondary | ICD-10-CM

## 2021-06-24 DIAGNOSIS — R0902 Hypoxemia: Secondary | ICD-10-CM | POA: Diagnosis not present

## 2021-06-24 MED ORDER — AMLODIPINE BESYLATE 5 MG PO TABS: 5.0000 mg | ORAL_TABLET | Freq: Every day | ORAL | 3 refills | Status: DC

## 2021-06-24 MED ORDER — FUROSEMIDE 20 MG PO TABS: 20.0000 mg | ORAL_TABLET | ORAL | 3 refills | Status: DC

## 2021-06-24 NOTE — Patient Instructions (Addendum)
Medication Instructions:  Your physician has recommended you make the following change in your medication:   1) CHANGE Furosemide (Lasix) to '20mg'$  three times a week on Mondays, Wednesdays, Fridays. 2) DECREASE Amlodipine to '5mg'$  daily  *If you need a refill on your cardiac medications before your next appointment, please call your pharmacy*  Lab Work: NONE  Testing/Procedures: NONE  Follow-Up: At [facility], you and your health needs are our priority.  As part of our continuing mission to provide you with exceptional heart care, we have created designated Provider Care Teams.  These Care Teams include your primary Cardiologist (physician) and Advanced Practice Providers (APPs -  Physician Assistants and Nurse Practitioners) who all work together to provide you with the care you need, when you need it.  Your next appointment:   3 month  The format for your next appointment:   In Person  Provider:   Sinclair Grooms, MD {  Other Instructions Use your oxygen at all times.  Important Information About Sugar

## 2021-06-30 ENCOUNTER — Telehealth: Payer: Self-pay | Admitting: Interventional Cardiology

## 2021-06-30 DIAGNOSIS — N179 Acute kidney failure, unspecified: Secondary | ICD-10-CM

## 2021-06-30 DIAGNOSIS — N183 Chronic kidney disease, stage 3 unspecified: Secondary | ICD-10-CM

## 2021-06-30 NOTE — Telephone Encounter (Signed)
Error .Adonis Housekeeper

## 2021-06-30 NOTE — Telephone Encounter (Signed)
Did not need this encounter °

## 2021-06-30 NOTE — Telephone Encounter (Signed)
New Message:      Patient's husband said the patient was referred to Dr Jeneen Rinks Deterding.aid he called to check on the referral and they same Dr Deterding had retired and they had not received the referral.

## 2021-06-30 NOTE — Telephone Encounter (Signed)
Per pt's husband Nephrology stated never received referral  Referral reentered Per husbands request ./cy

## 2021-07-11 DIAGNOSIS — I161 Hypertensive emergency: Secondary | ICD-10-CM | POA: Diagnosis not present

## 2021-07-11 DIAGNOSIS — I272 Pulmonary hypertension, unspecified: Secondary | ICD-10-CM | POA: Diagnosis not present

## 2021-07-11 DIAGNOSIS — E43 Unspecified severe protein-calorie malnutrition: Secondary | ICD-10-CM | POA: Diagnosis not present

## 2021-07-13 DIAGNOSIS — I161 Hypertensive emergency: Secondary | ICD-10-CM | POA: Diagnosis not present

## 2021-07-13 DIAGNOSIS — I272 Pulmonary hypertension, unspecified: Secondary | ICD-10-CM | POA: Diagnosis not present

## 2021-07-13 DIAGNOSIS — E43 Unspecified severe protein-calorie malnutrition: Secondary | ICD-10-CM | POA: Diagnosis not present

## 2021-08-10 DIAGNOSIS — I272 Pulmonary hypertension, unspecified: Secondary | ICD-10-CM | POA: Diagnosis not present

## 2021-08-10 DIAGNOSIS — I161 Hypertensive emergency: Secondary | ICD-10-CM | POA: Diagnosis not present

## 2021-08-10 DIAGNOSIS — E43 Unspecified severe protein-calorie malnutrition: Secondary | ICD-10-CM | POA: Diagnosis not present

## 2021-08-12 DIAGNOSIS — I161 Hypertensive emergency: Secondary | ICD-10-CM | POA: Diagnosis not present

## 2021-08-12 DIAGNOSIS — I272 Pulmonary hypertension, unspecified: Secondary | ICD-10-CM | POA: Diagnosis not present

## 2021-08-12 DIAGNOSIS — E43 Unspecified severe protein-calorie malnutrition: Secondary | ICD-10-CM | POA: Diagnosis not present

## 2021-08-16 DIAGNOSIS — D3500 Benign neoplasm of unspecified adrenal gland: Secondary | ICD-10-CM | POA: Diagnosis not present

## 2021-08-16 DIAGNOSIS — E079 Disorder of thyroid, unspecified: Secondary | ICD-10-CM | POA: Diagnosis not present

## 2021-08-16 DIAGNOSIS — I5189 Other ill-defined heart diseases: Secondary | ICD-10-CM | POA: Diagnosis not present

## 2021-08-16 DIAGNOSIS — I6783 Posterior reversible encephalopathy syndrome: Secondary | ICD-10-CM | POA: Diagnosis not present

## 2021-08-16 DIAGNOSIS — M109 Gout, unspecified: Secondary | ICD-10-CM | POA: Diagnosis not present

## 2021-08-16 DIAGNOSIS — I129 Hypertensive chronic kidney disease with stage 1 through stage 4 chronic kidney disease, or unspecified chronic kidney disease: Secondary | ICD-10-CM | POA: Diagnosis not present

## 2021-08-16 DIAGNOSIS — N184 Chronic kidney disease, stage 4 (severe): Secondary | ICD-10-CM | POA: Diagnosis not present

## 2021-08-16 DIAGNOSIS — E049 Nontoxic goiter, unspecified: Secondary | ICD-10-CM | POA: Diagnosis not present

## 2021-08-20 ENCOUNTER — Other Ambulatory Visit: Payer: Self-pay | Admitting: Nephrology

## 2021-08-20 DIAGNOSIS — I6783 Posterior reversible encephalopathy syndrome: Secondary | ICD-10-CM

## 2021-08-20 DIAGNOSIS — N184 Chronic kidney disease, stage 4 (severe): Secondary | ICD-10-CM

## 2021-08-20 DIAGNOSIS — I129 Hypertensive chronic kidney disease with stage 1 through stage 4 chronic kidney disease, or unspecified chronic kidney disease: Secondary | ICD-10-CM

## 2021-08-20 DIAGNOSIS — I5189 Other ill-defined heart diseases: Secondary | ICD-10-CM

## 2021-08-20 DIAGNOSIS — D3500 Benign neoplasm of unspecified adrenal gland: Secondary | ICD-10-CM

## 2021-09-01 ENCOUNTER — Other Ambulatory Visit (HOSPITAL_COMMUNITY): Payer: Self-pay | Admitting: Nephrology

## 2021-09-01 ENCOUNTER — Ambulatory Visit
Admission: RE | Admit: 2021-09-01 | Discharge: 2021-09-01 | Disposition: A | Discharge status: Home / Self Care | Payer: Medicare Other | Source: Ambulatory Visit | Attending: Nephrology | Admitting: Nephrology

## 2021-09-01 ENCOUNTER — Other Ambulatory Visit: Payer: Self-pay | Admitting: Nephrology

## 2021-09-01 DIAGNOSIS — D3500 Benign neoplasm of unspecified adrenal gland: Secondary | ICD-10-CM

## 2021-09-01 DIAGNOSIS — N184 Chronic kidney disease, stage 4 (severe): Secondary | ICD-10-CM

## 2021-09-01 DIAGNOSIS — I6783 Posterior reversible encephalopathy syndrome: Secondary | ICD-10-CM

## 2021-09-01 DIAGNOSIS — I129 Hypertensive chronic kidney disease with stage 1 through stage 4 chronic kidney disease, or unspecified chronic kidney disease: Secondary | ICD-10-CM

## 2021-09-01 DIAGNOSIS — N3289 Other specified disorders of bladder: Secondary | ICD-10-CM | POA: Diagnosis not present

## 2021-09-01 DIAGNOSIS — I5189 Other ill-defined heart diseases: Secondary | ICD-10-CM

## 2021-09-01 DIAGNOSIS — N189 Chronic kidney disease, unspecified: Secondary | ICD-10-CM | POA: Diagnosis not present

## 2021-09-01 DIAGNOSIS — N281 Cyst of kidney, acquired: Secondary | ICD-10-CM | POA: Diagnosis not present

## 2021-09-10 DIAGNOSIS — I161 Hypertensive emergency: Secondary | ICD-10-CM | POA: Diagnosis not present

## 2021-09-10 DIAGNOSIS — E43 Unspecified severe protein-calorie malnutrition: Secondary | ICD-10-CM | POA: Diagnosis not present

## 2021-09-10 DIAGNOSIS — I272 Pulmonary hypertension, unspecified: Secondary | ICD-10-CM | POA: Diagnosis not present

## 2021-09-12 DIAGNOSIS — I161 Hypertensive emergency: Secondary | ICD-10-CM | POA: Diagnosis not present

## 2021-09-12 DIAGNOSIS — E43 Unspecified severe protein-calorie malnutrition: Secondary | ICD-10-CM | POA: Diagnosis not present

## 2021-09-12 DIAGNOSIS — I272 Pulmonary hypertension, unspecified: Secondary | ICD-10-CM | POA: Diagnosis not present

## 2021-09-14 ENCOUNTER — Other Ambulatory Visit (HOSPITAL_COMMUNITY): Payer: Self-pay | Admitting: Physician Assistant

## 2021-09-14 DIAGNOSIS — Z01818 Encounter for other preprocedural examination: Secondary | ICD-10-CM

## 2021-09-15 ENCOUNTER — Ambulatory Visit (HOSPITAL_COMMUNITY)
Admission: RE | Admit: 2021-09-15 | Discharge: 2021-09-15 | Disposition: A | Discharge status: Home / Self Care | Payer: Medicare Other | Source: Ambulatory Visit | Attending: Nephrology | Admitting: Nephrology

## 2021-09-15 ENCOUNTER — Encounter (HOSPITAL_COMMUNITY): Payer: Self-pay

## 2021-09-15 ENCOUNTER — Other Ambulatory Visit: Payer: Self-pay

## 2021-09-15 DIAGNOSIS — R319 Hematuria, unspecified: Secondary | ICD-10-CM | POA: Diagnosis not present

## 2021-09-15 DIAGNOSIS — I129 Hypertensive chronic kidney disease with stage 1 through stage 4 chronic kidney disease, or unspecified chronic kidney disease: Secondary | ICD-10-CM | POA: Insufficient documentation

## 2021-09-15 DIAGNOSIS — Z01818 Encounter for other preprocedural examination: Secondary | ICD-10-CM

## 2021-09-15 DIAGNOSIS — R809 Proteinuria, unspecified: Secondary | ICD-10-CM | POA: Diagnosis not present

## 2021-09-15 DIAGNOSIS — N184 Chronic kidney disease, stage 4 (severe): Secondary | ICD-10-CM

## 2021-09-15 DIAGNOSIS — N179 Acute kidney failure, unspecified: Secondary | ICD-10-CM | POA: Diagnosis not present

## 2021-09-15 LAB — CBC
HCT: 40.4 % (ref 36.0–46.0)
Hemoglobin: 12.5 g/dL (ref 12.0–15.0)
MCH: 26.2 pg (ref 26.0–34.0)
MCHC: 30.9 g/dL (ref 30.0–36.0)
MCV: 84.5 fL (ref 80.0–100.0)
Platelets: 390 10*3/uL (ref 150–400)
RBC: 4.78 MIL/uL (ref 3.87–5.11)
RDW: 19.1 % — ABNORMAL HIGH (ref 11.5–15.5)
WBC: 9.2 10*3/uL (ref 4.0–10.5)
nRBC: 0 % (ref 0.0–0.2)

## 2021-09-15 LAB — PROTIME-INR
INR: 0.9 (ref 0.8–1.2)
Prothrombin Time: 12.1 seconds (ref 11.4–15.2)

## 2021-09-15 MED ORDER — MIDAZOLAM HCL 2 MG/2ML IJ SOLN
INTRAMUSCULAR | Status: AC
  Filled 2021-09-15: qty 2

## 2021-09-15 MED ORDER — LIDOCAINE-EPINEPHRINE 1 %-1:100000 IJ SOLN
INTRAMUSCULAR | Status: AC
  Filled 2021-09-15: qty 1

## 2021-09-15 MED ORDER — HYDRALAZINE HCL 20 MG/ML IJ SOLN
INTRAMUSCULAR | Status: AC
  Filled 2021-09-15: qty 1

## 2021-09-15 MED ORDER — FENTANYL CITRATE (PF) 100 MCG/2ML IJ SOLN
INTRAMUSCULAR | Status: AC | PRN
  Administered 2021-09-15 (×3): 25 ug via INTRAVENOUS

## 2021-09-15 MED ORDER — GELATIN ABSORBABLE 12-7 MM EX MISC
CUTANEOUS | Status: AC
  Filled 2021-09-15: qty 1

## 2021-09-15 MED ORDER — SODIUM CHLORIDE 0.9 % IV SOLN: INTRAVENOUS | Status: DC

## 2021-09-15 MED ORDER — MIDAZOLAM HCL 2 MG/2ML IJ SOLN
INTRAMUSCULAR | Status: AC | PRN
  Administered 2021-09-15 (×2): .5 mg via INTRAVENOUS

## 2021-09-15 MED ORDER — FENTANYL CITRATE (PF) 100 MCG/2ML IJ SOLN
INTRAMUSCULAR | Status: AC
  Filled 2021-09-15: qty 2

## 2021-09-15 NOTE — Procedures (Signed)
Pre Procedure Dx: Proteinuria of uncertain etiology Post Procedural Dx: Same  Technically successful US guided biopsy of inferior pole of the left kidney.   Procedure complicated be development of moderate size perinephric hematoma. Pt remain asymptomatic during prolonged observation in IR department.   Ronny Bacon, MD Pager #: [phone]

## 2021-09-15 NOTE — H&P (Signed)
Chief Complaint: Patient was seen in consultation today for random renal biopsy at the request of [place]  Referring Physician(s): Upton,Elizabeth  Supervising Physician: Sandi Mariscal  Patient Status: [facility] - Out-pt  History of Present Illness: Judy Chang is a 71 y.o. female   Chronic kidney disease stage 4 Proteinuria Hematuria Hypertension Worsening function per Nephrology Request for random renal biopsy 176/74 this am-- pt has taken BP meds this am  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   pt unaware   Anxiety    Arthritis    Avascular necrosis ([place])    Chronic left femoral head   Benign essential HTN 10/23/2015   Chondromalacia 02/25/2018   Noted on MRI: Mild   DDD (degenerative disc disease), lumbar 01/10/2011   Noted on MRI   Depression    DJD (degenerative joint disease)    GERD (gastroesophageal reflux disease)    History of cataract    Bilateral   Hypercholesterolemia    Hypertension    Labial tear 02/25/2018   Noted on MRI: smal Left anterior    Lumbar spondylosis 01/10/2011   Noted on MRI   Migraines    Renal cyst 11/17/2015   Notedon [place] Abd: Simple, Right   Spinal stenosis 09/06/2016   Noted on MRI: Moderate-Severe   Vitreous floater, bilateral     Past Surgical History:  Procedure Laterality Date   COLONOSCOPY     PARTIAL HYSTERECTOMY     ROTATOR CUFF REPAIR Left    TONSILLECTOMY     TOTAL HIP ARTHROPLASTY Left 06/22/2018   Procedure: TOTAL HIP ARTHROPLASTY ANTERIOR APPROACH;  Surgeon: Dorna Leitz, MD;  Location: [facility];  Service: Orthopedics;  Laterality: Left;    Allergies: Citalopram hydrobromide, Pregabalin, and Nsaids  Medications: Prior to Admission medications   Medication Sig Start Date End Date Taking? Authorizing Provider  amLODipine (NORVASC) 10 MG tablet Take 5-10 mg by mouth See admin instructions. Take 5 mg on Mon, Wed, and Fri Take 10 mg on Sun, Tues, Thurs,  and Sat   Yes [provider]  buPROPion (WELLBUTRIN XL) 150 MG 24 hr tablet Take 150 mg by mouth every morning. 03/12/21  Yes [provider]  carvedilol (COREG) 12.5 MG tablet Take 1 tablet (12.5 mg total) by mouth 2 (two) times daily with a meal. 02/17/19  Yes Darrick Meigs, Marge Duncans, MD  cloNIDine (CATAPRES - DOSED IN MG/24 HR) 0.2 mg/24hr patch Place 1 patch (0.2 mg total) onto the skin once a week. 02/23/19  Yes Oswald Hillock, MD  DULoxetine (CYMBALTA) 30 MG capsule Take 90 mg by mouth daily. 03/12/21  Yes [provider]  furosemide (LASIX) 20 MG tablet Take 1 tablet (20 mg total) by mouth 3 (three) times a week. Take on Mondays, Wednesdays, Fridays. 06/25/21  Yes Belva Crome, MD  levETIRAcetam (KEPPRA) 500 MG tablet Take 1 tablet (500 mg total) by mouth every 12 (twelve) hours. 02/17/19  Yes Oswald Hillock, MD  LORazepam (ATIVAN) 0.5 MG tablet Take 0.5 mg by mouth daily. 03/12/21  Yes [provider]  Oxycodone HCl 20 MG TABS Take 20 mg by mouth 4 (four) times daily as needed (pain). 10/29/14  Yes [provider]  traZODone (DESYREL) 100 MG tablet Take 100 mg by mouth at bedtime. 03/12/21  Yes [provider]  amLODipine (NORVASC) 5 MG tablet Take 1 tablet (5 mg total) by mouth daily. Patient not taking: Reported on 09/14/2021 06/24/21  Belva Crome, MD     Family History  Problem Relation Age of Onset   Hypertension Mother    Hypertension Father     Social History   Socioeconomic History   Marital status: Married    Spouse name: Not on file   Number of children: 0   Years of education: Not on file   Highest education level: High school graduate  Occupational History   Not on file  Tobacco Use   Smoking status: Every Day    Types: Cigars   Smokeless tobacco: Never   Tobacco comments:    off and on  Vaping Use   Vaping Use: Never used  Substance and Sexual Activity   Alcohol use: Not Currently    Comment: every few days, beer   Drug  use: Not Currently    Types: Marijuana    Comment: last time 06/13/2018   Sexual activity: Not on file  Other Topics Concern   Not on file  Social History Narrative   Lives with husband   Right handed   Caffeine: decaf coffee everyday, 2-3 cups   Social Determinants of Health   Financial Resource Strain: Not on file  Food Insecurity: Not on file  Transportation Needs: Not on file  Physical Activity: Not on file  Stress: Not on file  Social Connections: Not on file   Review of Systems: A 12 point ROS discussed and pertinent positives are indicated in the HPI above.  All other systems are negative.  Review of Systems  Constitutional:  Negative for activity change, fatigue and fever.  Respiratory:  Negative for cough and shortness of breath.   Cardiovascular:  Negative for chest pain.  Gastrointestinal:  Negative for abdominal pain.  Musculoskeletal:  Positive for back pain.  Neurological:  Negative for weakness.  Psychiatric/Behavioral:  Negative for behavioral problems and confusion.     Vital Signs: BP (!) 176/74 (BP Location: Right Arm)   Pulse 63   Temp 97.6 F (36.4 C) (Oral)   Resp 16   Ht '5\' 1"'$  (1.549 m)   Wt 106 lb (48.1 kg)   SpO2 97%   BMI 20.03 kg/m     Physical Exam Vitals reviewed.  HENT:     Mouth/Throat:     Mouth: Mucous membranes are moist.  Cardiovascular:     Rate and Rhythm: Normal rate and regular rhythm.     Heart sounds: Normal heart sounds.  Pulmonary:     Effort: Pulmonary effort is normal.     Breath sounds: Normal breath sounds.  Abdominal:     Palpations: Abdomen is soft.  Musculoskeletal:        General: Normal range of motion.     Right lower leg: No edema.     Left lower leg: No edema.  Skin:    General: Skin is warm.  Neurological:     Mental Status: She is alert and oriented to person, place, and time.  Psychiatric:        Behavior: Behavior normal.     Imaging: US RENAL  Result Date: 09/01/2021 CLINICAL DATA:   Chronic renal disease. EXAM: RENAL / URINARY TRACT ULTRASOUND COMPLETE COMPARISON:  Renal ultrasound May 08, 2021 FINDINGS: Right Kidney: Renal measurements: 12.3 x 5.9 x 5.2 cm = volume: 198 mL. Contains 2 cysts with the larger measuring 8.1 cm and the smaller measuring 8 mm. No follow-up imaging recommended for the cysts. Increased cortical echogenicity. Left Kidney: Renal measurements: 9.9 x 5.4 x  4.5 cm = volume: 126 mL. Contains a 9 mm cyst, better characterized on the previous study. No follow-up imaging recommended for the cyst. Increased cortical echogenicity. Bladder: Poorly evaluated due to lack of distention. No obvious abnormalities. Other: None. IMPRESSION: 1. Medical renal disease with increased cortical echogenicity. The kidneys are otherwise unremarkable. 2. The bladder is poorly evaluated due to lack of distention. Electronically Signed   By: Dorise Bullion III M.D.   On: 09/01/2021 14:23    Labs:  CBC: Recent Labs    05/08/21 0932 05/09/21 1419 05/10/21 0903 05/11/21 0140  WBC 6.1 6.0 8.3 8.2  HGB 12.8 12.5 12.9 11.5*  HCT 40.2 39.6 39.1 36.3  PLT 338 320 315 262    COAGS: No results for input(s): "INR", "APTT" in the last 8760 hours.  BMP: Recent Labs    05/08/21 0932 05/09/21 1419 05/10/21 0903 05/11/21 0140  NA 139 138 140 137  K 4.0 4.0 3.8 3.8  CL 109 111 111 107  CO2 '25 24 23 23  '$ GLUCOSE 110* 95 83 108*  BUN '20 21 18 17  '$ CALCIUM 9.0 9.0 9.0 8.5*  CREATININE 2.36* 2.02* 1.89* 1.82*  GFRNONAA 21* 26* 28* 29*    LIVER FUNCTION TESTS: Recent Labs    05/08/21 0932 05/09/21 1419 05/10/21 0903 05/11/21 0140  BILITOT 0.5 0.4 0.7 0.4  AST 14* 14* 12* 13*  ALT '8 7 7 7  '$ ALKPHOS 85 80 84 74  PROT 6.2* 6.2* 5.9* 5.3*  ALBUMIN 2.5* 2.2* 2.2* 2.0*    TUMOR MARKERS: No results for input(s): "AFPTM", "CEA", "CA199", "CHROMGRNA" in the last 8760 hours.  Assessment and Plan:  CKD 4 Proteinuria and hematuria Worsening function Renal biopsy today per  Nephrology Risks and benefits of random renal biopsy was discussed with the patient and/or patient's family including, but not limited to bleeding, infection, damage to adjacent structures or low yield requiring additional tests.  All of the questions were answered and there is agreement to proceed.  Consent signed and in chart.   Thank you for this interesting consult.  I greatly enjoyed meeting Judy Chang and look forward to participating in their care.  A copy of this report was sent to the requesting provider on this date.  Electronically Signed: Lavonia Drafts, PA-C 09/15/2021, 6:50 AM   I spent a total of  30 Minutes   in face to face in clinical consultation, greater than 50% of which was counseling/coordinating care for random renal biopsy

## 2021-09-17 ENCOUNTER — Encounter (HOSPITAL_COMMUNITY): Payer: Self-pay

## 2021-09-17 LAB — SURGICAL PATHOLOGY

## 2021-10-03 NOTE — Progress Notes (Unsigned)
Cardiology Office Note:    Date:  10/03/2021   ID:  CLOVER FEEHAN, DOB 1950/04/19, MRN [id number]  PCP:  [name], MD  Cardiologist:  Sinclair Grooms, MD   Referring MD: [name], MD   No chief complaint on file.   History of Present Illness:    Judy Chang is a 71 y.o. female with a hx of primary hypertension, hyperlipidemia, adrenal adenoma, protein calorie malnutrition, and diastolic dysfunction on echocardiogram who was admitted by [facility] with bilateral LE edema.   She is doing well.  She wants to get off of oxygen.  No chest pain, orthopnea, PND, lower extremity edema, or PND.  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   pt unaware   Anxiety    Arthritis    Avascular necrosis ([place])    Chronic left femoral head   Benign essential HTN 10/23/2015   Chondromalacia 02/25/2018   Noted on MRI: Mild   DDD (degenerative disc disease), lumbar 01/10/2011   Noted on MRI   Depression    DJD (degenerative joint disease)    GERD (gastroesophageal reflux disease)    History of cataract    Bilateral   Hypercholesterolemia    Hypertension    Labial tear 02/25/2018   Noted on MRI: smal Left anterior    Lumbar spondylosis 01/10/2011   Noted on MRI   Migraines    Renal cyst 11/17/2015   Notedon [place] Abd: Simple, Right   Spinal stenosis 09/06/2016   Noted on MRI: Moderate-Severe   Vitreous floater, bilateral     Past Surgical History:  Procedure Laterality Date   COLONOSCOPY     PARTIAL HYSTERECTOMY     ROTATOR CUFF REPAIR Left    TONSILLECTOMY     TOTAL HIP ARTHROPLASTY Left 06/22/2018   Procedure: TOTAL HIP ARTHROPLASTY ANTERIOR APPROACH;  Surgeon: Dorna Leitz, MD;  Location: [facility];  Service: Orthopedics;  Laterality: Left;    Current Medications: No outpatient medications have been marked as taking for the 10/04/21 encounter (Appointment) with Belva Crome, MD.     Allergies:   Citalopram hydrobromide,  Pregabalin, and Nsaids   Social History   Socioeconomic History   Marital status: Married    Spouse name: Not on file   Number of children: 0   Years of education: Not on file   Highest education level: High school graduate  Occupational History   Not on file  Tobacco Use   Smoking status: Every Day    Types: Cigars   Smokeless tobacco: Never   Tobacco comments:    off and on  Vaping Use   Vaping Use: Never used  Substance and Sexual Activity   Alcohol use: Not Currently    Comment: every few days, beer   Drug use: Not Currently    Types: Marijuana    Comment: last time 06/13/2018   Sexual activity: Not on file  Other Topics Concern   Not on file  Social History Narrative   Lives with husband   Right handed   Caffeine: decaf coffee everyday, 2-3 cups   Social Determinants of Health   Financial Resource Strain: Not on file  Food Insecurity: Not on file  Transportation Needs: Not on file  Physical Activity: Not on file  Stress: Not on file  Social Connections: Not on file     Family History: The patient's family history includes Hypertension in her father and mother.  ROS:  Please see the history of present illness.    No new data.  Does not smoke.  All other systems reviewed and are negative.  EKGs/Labs/Other Studies Reviewed:    The following studies were reviewed today:  2 D Doppler ECHOCARDIOGRAM : IMPRESSIONS   1. Left ventricular ejection fraction, by estimation, is 60 to 65%. The  left ventricle has normal function. The left ventricle has no regional  wall motion abnormalities. Left ventricular diastolic parameters are  consistent with Grade I diastolic  dysfunction (impaired relaxation).   2. Right ventricular systolic function is normal. The right ventricular  size is normal. There is moderately elevated pulmonary artery systolic  pressure. The estimated right ventricular systolic pressure is 52.7 mmHg.   3. The mitral valve is normal in  structure. No evidence of mitral valve  regurgitation. No evidence of mitral stenosis.   4. The aortic valve is tricuspid. Aortic valve regurgitation is not  visualized. No aortic stenosis is present.   5. The inferior vena cava is normal in size with <50% respiratory  variability, suggesting right atrial pressure of 8 mmHg.   EKG:  EKG not performed  Recent Labs: 05/07/2021: B Natriuretic Peptide 373.9 05/09/2021: TSH 0.378 05/11/2021: ALT 7; BUN 17; Creatinine, Ser 1.82; Magnesium 1.6; Potassium 3.8; Sodium 137 09/15/2021: Hemoglobin 12.5; Platelets 390  Recent Lipid Panel    Component Value Date/Time   CHOL 208 (H) 10/22/2015 2103   TRIG 163 (H) 02/09/2019 0342   HDL 102 10/22/2015 2103   CHOLHDL 2.0 10/22/2015 2103   VLDL 13 10/22/2015 2103   LDLCALC 93 10/22/2015 2103    Physical Exam:    VS:  There were no vitals taken for this visit.    Wt Readings from Last 3 Encounters:  09/15/21 106 lb (48.1 kg)  06/24/21 106 lb 6.4 oz (48.3 kg)  05/09/21 122 lb 9.2 oz (55.6 kg)     GEN: Slender. No acute distress HEENT: Normal NECK: No JVD. LYMPHATICS: No lymphadenopathy CARDIAC: No murmur. RRR no gallop, or edema. VASCULAR:  Normal Pulses. No bruits. RESPIRATORY:  Clear to auscultation without rales, wheezing or rhonchi  ABDOMEN: Soft, non-tender, non-distended, No pulsatile mass, MUSCULOSKELETAL: No deformity  SKIN: Warm and dry NEUROLOGIC:  Alert and oriented x 3 PSYCHIATRIC:  Normal affect   ASSESSMENT:    1. Grade I diastolic dysfunction   2. Stage 3 chronic kidney disease, unspecified whether stage 3a or 3b CKD ([place])   3. Primary hypertension   4. Pulmonary hypertension ([place])   5. Bilateral lower extremity edema    PLAN:    In order of problems listed above:  No evidence of volume overload.  She is on furosemide 20 mg 3 times per week.  Be met needs to be done today. We need an updated kidney function test. Blood pressure is under excellent control No  evidence of right heart failure No peripheral edema.  Kidney function is stable and with good blood pressures we may be able to discontinue furosemide altogether or consider switching the patient to an SGLT2.   Medication Adjustments/Labs and Tests Ordered: Current medicines are reviewed at length with the patient today.  Concerns regarding medicines are outlined above.  No orders of the defined types were placed in this encounter.  No orders of the defined types were placed in this encounter.   There are no Patient Instructions on file for this visit.   Signed, Sinclair Grooms, MD  10/03/2021 4:33 PM    [facility]

## 2021-10-04 ENCOUNTER — Encounter: Payer: Self-pay | Admitting: Interventional Cardiology

## 2021-10-04 ENCOUNTER — Ambulatory Visit: Payer: Medicare Other | Attending: Interventional Cardiology | Admitting: Interventional Cardiology

## 2021-10-04 VITALS — BP 138/68 | HR 50 | Ht 61.0 in | Wt 104.0 lb

## 2021-10-04 DIAGNOSIS — I10 Essential (primary) hypertension: Secondary | ICD-10-CM

## 2021-10-04 DIAGNOSIS — I5189 Other ill-defined heart diseases: Secondary | ICD-10-CM | POA: Diagnosis not present

## 2021-10-04 DIAGNOSIS — N183 Chronic kidney disease, stage 3 unspecified: Secondary | ICD-10-CM

## 2021-10-04 DIAGNOSIS — R600 Localized edema: Secondary | ICD-10-CM

## 2021-10-04 DIAGNOSIS — I272 Pulmonary hypertension, unspecified: Secondary | ICD-10-CM | POA: Diagnosis not present

## 2021-10-04 MED ORDER — FUROSEMIDE 20 MG PO TABS: 20.0000 mg | ORAL_TABLET | ORAL | 3 refills | Status: DC

## 2021-10-04 NOTE — Patient Instructions (Addendum)
Medication Instructions:  Your physician recommends that you continue on your current medications as directed. Please refer to the Current Medication list given to you today.  *If you need a refill on your cardiac medications before your next appointment, please call your pharmacy*  Lab Work: TODAY: BMET If you have labs (blood work) drawn today and your tests are completely normal, you will receive your results only by: [place] (if you have MyChart) OR A paper copy in the mail If you have any lab test that is abnormal or we need to change your treatment, we will call you to review the results.  Testing/Procedures: NONE  Follow-Up: As needed  Other Instructions Please check your oxygen saturation at rest and after walking, record your readings and bring them to your primary care provider to discuss discontinuing oxygen.  Important Information About Sugar

## 2021-10-05 LAB — BASIC METABOLIC PANEL
BUN/Creatinine Ratio: 9 — ABNORMAL LOW (ref 12–28)
BUN: 23 mg/dL (ref 8–27)
CO2: 23 mmol/L (ref 20–29)
Calcium: 9.9 mg/dL (ref 8.7–10.3)
Chloride: 103 mmol/L (ref 96–106)
Creatinine, Ser: 2.47 mg/dL — ABNORMAL HIGH (ref 0.57–1.00)
Glucose: 78 mg/dL (ref 70–99)
Potassium: 5 mmol/L (ref 3.5–5.2)
Sodium: 141 mmol/L (ref 134–144)
eGFR: 20 mL/min/{1.73_m2} — ABNORMAL LOW (ref >59)

## 2021-10-08 ENCOUNTER — Telehealth: Payer: Self-pay

## 2021-10-08 NOTE — Telephone Encounter (Signed)
Spoke with patient and discussed lab results.  Per Dr. Tamala Julian: Let the patient know the kidney numbers are worsening compared to 4 months ago.  Creatinine is 2.47.  I recommend discontinuation of the furosemide that she is taking 3 times per week and monitor for any evidence of swelling.  Patient verbalized understanding and expressed appreciation for call.  Med list updated.

## 2021-10-08 NOTE — Telephone Encounter (Signed)
-----   Message from Belva Crome, MD sent at 10/05/2021  7:01 PM EDT ----- Let the patient know the kidney numbers are worsening compared to 4 months ago.  Creatinine is 2.47.  I recommend discontinuation of the furosemide that she is taking 3 times per week and monitor for any evidence of swelling. A copy will be sent to Stacie Glaze, DO

## 2021-10-11 DIAGNOSIS — I161 Hypertensive emergency: Secondary | ICD-10-CM | POA: Diagnosis not present

## 2021-10-11 DIAGNOSIS — I272 Pulmonary hypertension, unspecified: Secondary | ICD-10-CM | POA: Diagnosis not present

## 2021-10-11 DIAGNOSIS — E43 Unspecified severe protein-calorie malnutrition: Secondary | ICD-10-CM | POA: Diagnosis not present

## 2021-10-13 DIAGNOSIS — E43 Unspecified severe protein-calorie malnutrition: Secondary | ICD-10-CM | POA: Diagnosis not present

## 2021-10-13 DIAGNOSIS — I161 Hypertensive emergency: Secondary | ICD-10-CM | POA: Diagnosis not present

## 2021-10-13 DIAGNOSIS — I272 Pulmonary hypertension, unspecified: Secondary | ICD-10-CM | POA: Diagnosis not present

## 2021-10-22 DIAGNOSIS — R0902 Hypoxemia: Secondary | ICD-10-CM | POA: Diagnosis not present

## 2021-10-22 DIAGNOSIS — N184 Chronic kidney disease, stage 4 (severe): Secondary | ICD-10-CM | POA: Diagnosis not present

## 2021-10-22 DIAGNOSIS — G4700 Insomnia, unspecified: Secondary | ICD-10-CM | POA: Diagnosis not present

## 2021-10-22 DIAGNOSIS — I10 Essential (primary) hypertension: Secondary | ICD-10-CM | POA: Diagnosis not present

## 2021-10-22 DIAGNOSIS — E041 Nontoxic single thyroid nodule: Secondary | ICD-10-CM | POA: Diagnosis not present

## 2021-10-22 DIAGNOSIS — Z23 Encounter for immunization: Secondary | ICD-10-CM | POA: Diagnosis not present

## 2021-10-22 DIAGNOSIS — I5189 Other ill-defined heart diseases: Secondary | ICD-10-CM | POA: Diagnosis not present

## 2021-10-22 DIAGNOSIS — G894 Chronic pain syndrome: Secondary | ICD-10-CM | POA: Diagnosis not present

## 2021-11-10 DIAGNOSIS — I272 Pulmonary hypertension, unspecified: Secondary | ICD-10-CM | POA: Diagnosis not present

## 2021-11-10 DIAGNOSIS — I161 Hypertensive emergency: Secondary | ICD-10-CM | POA: Diagnosis not present

## 2021-11-10 DIAGNOSIS — E43 Unspecified severe protein-calorie malnutrition: Secondary | ICD-10-CM | POA: Diagnosis not present

## 2021-11-12 DIAGNOSIS — E43 Unspecified severe protein-calorie malnutrition: Secondary | ICD-10-CM | POA: Diagnosis not present

## 2021-11-12 DIAGNOSIS — I161 Hypertensive emergency: Secondary | ICD-10-CM | POA: Diagnosis not present

## 2021-11-12 DIAGNOSIS — I272 Pulmonary hypertension, unspecified: Secondary | ICD-10-CM | POA: Diagnosis not present

## 2021-11-22 ENCOUNTER — Institutional Professional Consult (permissible substitution): Payer: Medicare Other | Admitting: Internal Medicine

## 2021-12-09 ENCOUNTER — Encounter: Payer: Self-pay | Admitting: Pulmonary Disease

## 2021-12-09 ENCOUNTER — Ambulatory Visit: Payer: Medicare Other | Admitting: Pulmonary Disease

## 2021-12-09 DIAGNOSIS — G4734 Idiopathic sleep related nonobstructive alveolar hypoventilation: Secondary | ICD-10-CM | POA: Diagnosis not present

## 2021-12-09 NOTE — Patient Instructions (Signed)
Nice to meet you  I have ordered overnight oximetry test to check your oxygen saturation at night.  We will be in contact to schedule this.  When you do the test at home, please make sure to not wear your oxygen.  I want the test to be done on room air.  Continue to monitor oxygen saturation.  It sounds like it staying above 88% at rest and with exertion.  It is okay to not use your oxygen as long as the oxygen saturation is staying above 88% at rest and with exertion.  Return to clinic will be based on results of the overnight oximetry test, if you have not heard the results after a week or 2, please call our office at [phone] and ask about the results.

## 2021-12-11 DIAGNOSIS — I272 Pulmonary hypertension, unspecified: Secondary | ICD-10-CM | POA: Diagnosis not present

## 2021-12-11 DIAGNOSIS — E43 Unspecified severe protein-calorie malnutrition: Secondary | ICD-10-CM | POA: Diagnosis not present

## 2021-12-11 DIAGNOSIS — I161 Hypertensive emergency: Secondary | ICD-10-CM | POA: Diagnosis not present

## 2021-12-13 DIAGNOSIS — E43 Unspecified severe protein-calorie malnutrition: Secondary | ICD-10-CM | POA: Diagnosis not present

## 2021-12-13 DIAGNOSIS — I161 Hypertensive emergency: Secondary | ICD-10-CM | POA: Diagnosis not present

## 2021-12-13 DIAGNOSIS — I272 Pulmonary hypertension, unspecified: Secondary | ICD-10-CM | POA: Diagnosis not present

## 2021-12-20 NOTE — Progress Notes (Signed)
$'[name]$  ID: Judy Chang, female    DOB: 1950-01-30, 71 y.o.   MRN: [id number]  Chief Complaint  Patient presents with   Consult    Referring provider: [name], MD  HPI:   71 y.o. woman whom are seen in consultation for evaluation of history of hypoxemic respiratory failure.  Most recent cardiology note x2 reviewed.  Discharge summary 04/2021 reviewed.  Patient was admitted to hospital 04/2021 with CHF exacerbation.  That point time she was discharged from the hospital on oxygen, 1 L at all times.  She is continue to for the last several months.  She is noted for at least several months since discharge, really started a month or 2 after discharge, lack of need for oxygen.  Oxygen saturation stayed in the high 90s with rest and exertion.  Occasionally drops to mid 90s.  With exertion.  This is on room air.  She is stopped using her oxygen during the day.  She does wear it at night, 1 L.  She reports good sleep.  He has dyspnea but largely unchanged.  Overall better from several months ago.  No issues with cough.  Reviewed most recent CT scan of the chest 04/2021 that on my review and interpretation reveals emphysema and bilateral lower lobe mild bronchiectasis, otherwise clear lungs.  She had a nuclear medicine perfusion scan to work-up hypoxemia that was negative on my review while in the hospital 04/2021  PMH: CHF, hypertension, history of seizure disorder, chronic pain Family history: Mother with hypertension, father with hypertension Surgical history: Hysterectomy, rotator cuff repair, tonsillectomy, hip replacement Social history: Current smoker, lives in [place] / Pulmonary Flowsheets:   ACT:      No data to display          MMRC:     No data to display          Epworth:      No data to display          Tests:   FENO:  No results found for: "NITRICOXIDE"  PFT:     No data to display          WALK:      No data to  display          Imaging: Personally reviewed and as per EMR and discussion in this note No results found.  Lab Results: Personally reviewed CBC    Component Value Date/Time   WBC 9.2 09/15/2021 0628   RBC 4.78 09/15/2021 0628   HGB 12.5 09/15/2021 0628   HCT 40.4 09/15/2021 0628   PLT 390 09/15/2021 0628   MCV 84.5 09/15/2021 0628   MCH 26.2 09/15/2021 0628   MCHC 30.9 09/15/2021 0628   RDW 19.1 (H) 09/15/2021 0628   LYMPHSABS 1.8 05/11/2021 0140   MONOABS 1.1 (H) 05/11/2021 0140   EOSABS 0.2 05/11/2021 0140   BASOSABS 0.0 05/11/2021 0140    BMET    Component Value Date/Time   NA 141 10/04/2021 1504   K 5.0 10/04/2021 1504   CL 103 10/04/2021 1504   CO2 23 10/04/2021 1504   GLUCOSE 78 10/04/2021 1504   GLUCOSE 108 (H) 05/11/2021 0140   BUN 23 10/04/2021 1504   CREATININE 2.47 (H) 10/04/2021 1504   CALCIUM 9.9 10/04/2021 1504   GFRNONAA 29 (L) 05/11/2021 0140   GFRAA 46 (L) 02/14/2019 0412    BNP    Component Value Date/Time   BNP 373.9 (H) 05/07/2021 2240  ProBNP No results found for: "PROBNP"  Specialty Problems   None   Allergies  Allergen Reactions   Citalopram Hydrobromide     Unknown   Pregabalin     Unknown   Nsaids Hives and Itching    Burning of the tongue     Immunization History  Administered Date(s) Administered   Influenza, High Dose Seasonal PF 11/24/2017   Influenza,inj,Quad PF,6+ Mos 10/23/2015   PNEUMOCOCCAL CONJUGATE-20 05/09/2021   Pneumococcal Polysaccharide-23 10/23/2015    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   pt unaware   Anxiety    Arthritis    Avascular necrosis ([place])    Chronic left femoral head   Benign essential HTN 10/23/2015   Chondromalacia 02/25/2018   Noted on MRI: Mild   DDD (degenerative disc disease), lumbar 01/10/2011   Noted on MRI   Depression    DJD (degenerative joint disease)    GERD (gastroesophageal reflux disease)    History of  cataract    Bilateral   Hypercholesterolemia    Hypertension    Labial tear 02/25/2018   Noted on MRI: smal Left anterior    Lumbar spondylosis 01/10/2011   Noted on MRI   Migraines    Renal cyst 11/17/2015   Notedon [place] Abd: Simple, Right   Spinal stenosis 09/06/2016   Noted on MRI: Moderate-Severe   Vitreous floater, bilateral     Tobacco History: Social History   Tobacco Use  Smoking Status Every Day   Types: Cigars  Smokeless Tobacco Never  Tobacco Comments   off and on   Ready to quit: Not Answered Counseling given: Not Answered Tobacco comments: off and on   Continue to not smoke  Outpatient Encounter Medications as of 12/09/2021  Medication Sig   amLODipine (NORVASC) 10 MG tablet Take 5-10 mg by mouth See admin instructions. Take 5 mg on Mon, Wed, and Fri Take 10 mg on Sun, Tues, Thurs, and Sat   amLODipine (NORVASC) 5 MG tablet Take 1 tablet (5 mg total) by mouth daily. (Patient taking differently: Take 5 mg by mouth daily. TAKE '5MG'$  M-W-F)   buPROPion (WELLBUTRIN XL) 150 MG 24 hr tablet Take 150 mg by mouth every morning.   carvedilol (COREG) 12.5 MG tablet Take 1 tablet (12.5 mg total) by mouth 2 (two) times daily with a meal.   cloNIDine (CATAPRES - DOSED IN MG/24 HR) 0.2 mg/24hr patch Place 1 patch (0.2 mg total) onto the skin once a week.   DULoxetine (CYMBALTA) 30 MG capsule Take 90 mg by mouth daily.   levETIRAcetam (KEPPRA) 500 MG tablet Take 1 tablet (500 mg total) by mouth every 12 (twelve) hours.   LORazepam (ATIVAN) 0.5 MG tablet Take 0.5 mg by mouth daily.   Oxycodone HCl 20 MG TABS Take 20 mg by mouth 4 (four) times daily as needed (pain).   traZODone (DESYREL) 100 MG tablet Take 100 mg by mouth at bedtime.   No facility-administered encounter medications on file as of 12/09/2021.     Review of Systems  Review of Systems  No chest pain with exertion.  No orthopnea or PND.  Comprehensive review of systems otherwise negative. Physical  Exam  BP 130/70 (BP Location: Left Arm, Patient Position: Sitting, Cuff Size: Normal)   Pulse 72   Ht 5' 1.5" (1.562 m)   Wt 110 lb 3.2 oz (50 kg)   SpO2 100%   BMI 20.48 kg/m   Wt Readings from  Last 5 Encounters:  12/09/21 110 lb 3.2 oz (50 kg)  10/04/21 104 lb (47.2 kg)  09/15/21 106 lb (48.1 kg)  06/24/21 106 lb 6.4 oz (48.3 kg)  05/09/21 122 lb 9.2 oz (55.6 kg)    BMI Readings from Last 5 Encounters:  12/09/21 20.48 kg/m  10/04/21 19.65 kg/m  09/15/21 20.03 kg/m  06/24/21 19.78 kg/m  05/09/21 22.79 kg/m     Physical Exam General: Sitting in chair, no acute distress Eyes: EOMI, no icterus Neck: Supple, no JVP appreciated sitting upright Pulmonary: Clear, normal work of breathing Cardiovascular: Warm, no edema Abdomen: Nondistended, bowel sounds present MSK: No synovitis, no joint effusion Neuro: Normal gait, no weakness Psych: Normal mood, full affect  Assessment & Plan:   History of chronic hypoxemic respiratory failure.  In the setting of volume overload, CHF exacerbation.  During the day she never drops below 90%, usually high 90s at rest, mid 90s with exertion.  Oxygen saturation 100% today after ambulating into the exam room.  No need to continue during the day.  Overnight oximetry ordered to assess for nocturnal hypoxemia.  Test to be done on room air.  If results show need for nocturnal oxygen then will continue prescription.  If not, okay to return oxygen and follow-up as needed.  History of congestive heart failure: With exacerbations in the past including hospitalization 04/2021 prompting oxygen need.  Appears euvolemic today.  Encourage continue follow-up with cardiology.  Return if symptoms worsen or fail to improve.   Lanier Clam, MD 12/09/2021

## 2022-01-10 DIAGNOSIS — I161 Hypertensive emergency: Secondary | ICD-10-CM | POA: Diagnosis not present

## 2022-01-10 DIAGNOSIS — I272 Pulmonary hypertension, unspecified: Secondary | ICD-10-CM | POA: Diagnosis not present

## 2022-01-10 DIAGNOSIS — E43 Unspecified severe protein-calorie malnutrition: Secondary | ICD-10-CM | POA: Diagnosis not present

## 2022-01-12 DIAGNOSIS — E43 Unspecified severe protein-calorie malnutrition: Secondary | ICD-10-CM | POA: Diagnosis not present

## 2022-01-12 DIAGNOSIS — I272 Pulmonary hypertension, unspecified: Secondary | ICD-10-CM | POA: Diagnosis not present

## 2022-01-12 DIAGNOSIS — I161 Hypertensive emergency: Secondary | ICD-10-CM | POA: Diagnosis not present

## 2022-01-13 DIAGNOSIS — G4730 Sleep apnea, unspecified: Secondary | ICD-10-CM | POA: Diagnosis not present

## 2022-01-13 DIAGNOSIS — R0683 Snoring: Secondary | ICD-10-CM | POA: Diagnosis not present

## 2022-01-28 DIAGNOSIS — G894 Chronic pain syndrome: Secondary | ICD-10-CM | POA: Diagnosis not present

## 2022-01-28 DIAGNOSIS — N184 Chronic kidney disease, stage 4 (severe): Secondary | ICD-10-CM | POA: Diagnosis not present

## 2022-01-28 DIAGNOSIS — I5189 Other ill-defined heart diseases: Secondary | ICD-10-CM | POA: Diagnosis not present

## 2022-01-28 DIAGNOSIS — G4700 Insomnia, unspecified: Secondary | ICD-10-CM | POA: Diagnosis not present

## 2022-01-28 DIAGNOSIS — I10 Essential (primary) hypertension: Secondary | ICD-10-CM | POA: Diagnosis not present

## 2022-01-28 DIAGNOSIS — E041 Nontoxic single thyroid nodule: Secondary | ICD-10-CM | POA: Diagnosis not present

## 2022-02-02 DIAGNOSIS — I129 Hypertensive chronic kidney disease with stage 1 through stage 4 chronic kidney disease, or unspecified chronic kidney disease: Secondary | ICD-10-CM | POA: Diagnosis not present

## 2022-02-02 DIAGNOSIS — I6783 Posterior reversible encephalopathy syndrome: Secondary | ICD-10-CM | POA: Diagnosis not present

## 2022-02-02 DIAGNOSIS — E049 Nontoxic goiter, unspecified: Secondary | ICD-10-CM | POA: Diagnosis not present

## 2022-02-02 DIAGNOSIS — D3500 Benign neoplasm of unspecified adrenal gland: Secondary | ICD-10-CM | POA: Diagnosis not present

## 2022-02-02 DIAGNOSIS — N184 Chronic kidney disease, stage 4 (severe): Secondary | ICD-10-CM | POA: Diagnosis not present

## 2022-02-02 DIAGNOSIS — I5189 Other ill-defined heart diseases: Secondary | ICD-10-CM | POA: Diagnosis not present

## 2022-02-10 DIAGNOSIS — E43 Unspecified severe protein-calorie malnutrition: Secondary | ICD-10-CM | POA: Diagnosis not present

## 2022-02-10 DIAGNOSIS — I161 Hypertensive emergency: Secondary | ICD-10-CM | POA: Diagnosis not present

## 2022-02-10 DIAGNOSIS — I272 Pulmonary hypertension, unspecified: Secondary | ICD-10-CM | POA: Diagnosis not present

## 2022-02-12 DIAGNOSIS — E43 Unspecified severe protein-calorie malnutrition: Secondary | ICD-10-CM | POA: Diagnosis not present

## 2022-02-12 DIAGNOSIS — I272 Pulmonary hypertension, unspecified: Secondary | ICD-10-CM | POA: Diagnosis not present

## 2022-02-12 DIAGNOSIS — I161 Hypertensive emergency: Secondary | ICD-10-CM | POA: Diagnosis not present

## 2022-03-13 DIAGNOSIS — I272 Pulmonary hypertension, unspecified: Secondary | ICD-10-CM | POA: Diagnosis not present

## 2022-03-13 DIAGNOSIS — I161 Hypertensive emergency: Secondary | ICD-10-CM | POA: Diagnosis not present

## 2022-03-13 DIAGNOSIS — E43 Unspecified severe protein-calorie malnutrition: Secondary | ICD-10-CM | POA: Diagnosis not present

## 2022-03-15 DIAGNOSIS — E43 Unspecified severe protein-calorie malnutrition: Secondary | ICD-10-CM | POA: Diagnosis not present

## 2022-03-15 DIAGNOSIS — I272 Pulmonary hypertension, unspecified: Secondary | ICD-10-CM | POA: Diagnosis not present

## 2022-03-15 DIAGNOSIS — I161 Hypertensive emergency: Secondary | ICD-10-CM | POA: Diagnosis not present

## 2022-04-11 DIAGNOSIS — I272 Pulmonary hypertension, unspecified: Secondary | ICD-10-CM | POA: Diagnosis not present

## 2022-04-11 DIAGNOSIS — E43 Unspecified severe protein-calorie malnutrition: Secondary | ICD-10-CM | POA: Diagnosis not present

## 2022-04-11 DIAGNOSIS — I161 Hypertensive emergency: Secondary | ICD-10-CM | POA: Diagnosis not present

## 2022-04-13 DIAGNOSIS — I272 Pulmonary hypertension, unspecified: Secondary | ICD-10-CM | POA: Diagnosis not present

## 2022-04-13 DIAGNOSIS — I161 Hypertensive emergency: Secondary | ICD-10-CM | POA: Diagnosis not present

## 2022-04-13 DIAGNOSIS — E43 Unspecified severe protein-calorie malnutrition: Secondary | ICD-10-CM | POA: Diagnosis not present

## 2022-04-19 ENCOUNTER — Emergency Department (HOSPITAL_COMMUNITY): Payer: Medicare Other

## 2022-04-19 ENCOUNTER — Emergency Department (HOSPITAL_COMMUNITY)
Admission: EM | Admit: 2022-04-19 | Discharge: 2022-04-19 | Disposition: A | Discharge status: Home / Self Care | Payer: Medicare Other | Attending: Emergency Medicine | Admitting: Emergency Medicine

## 2022-04-19 ENCOUNTER — Other Ambulatory Visit: Payer: Self-pay

## 2022-04-19 ENCOUNTER — Encounter (HOSPITAL_COMMUNITY): Payer: Self-pay | Admitting: *Deleted

## 2022-04-19 ENCOUNTER — Ambulatory Visit (HOSPITAL_COMMUNITY)
Admission: EM | Admit: 2022-04-19 | Discharge: 2022-04-19 | Disposition: A | Discharge status: Home / Self Care | Payer: Medicare Other | Attending: Family Medicine | Admitting: Family Medicine

## 2022-04-19 DIAGNOSIS — S02601A Fracture of unspecified part of body of right mandible, initial encounter for closed fracture: Secondary | ICD-10-CM | POA: Diagnosis not present

## 2022-04-19 DIAGNOSIS — S02651A Fracture of angle of right mandible, initial encounter for closed fracture: Secondary | ICD-10-CM | POA: Diagnosis not present

## 2022-04-19 DIAGNOSIS — M542 Cervicalgia: Secondary | ICD-10-CM | POA: Diagnosis not present

## 2022-04-19 DIAGNOSIS — R519 Headache, unspecified: Secondary | ICD-10-CM | POA: Diagnosis not present

## 2022-04-19 DIAGNOSIS — R6884 Jaw pain: Secondary | ICD-10-CM | POA: Diagnosis not present

## 2022-04-19 DIAGNOSIS — S0990XA Unspecified injury of head, initial encounter: Secondary | ICD-10-CM

## 2022-04-19 DIAGNOSIS — S0993XA Unspecified injury of face, initial encounter: Secondary | ICD-10-CM | POA: Diagnosis present

## 2022-04-19 DIAGNOSIS — W19XXXA Unspecified fall, initial encounter: Secondary | ICD-10-CM | POA: Insufficient documentation

## 2022-04-19 MED ORDER — OXYCODONE HCL 5 MG PO TABS
20.0000 mg | ORAL_TABLET | ORAL | Status: AC
  Administered 2022-04-19: 20 mg via ORAL
  Filled 2022-04-19: qty 4

## 2022-04-19 NOTE — ED Provider Notes (Signed)
[place] EMERGENCY DEPARTMENT AT [facility] Provider Note   CSN: [id number] Arrival date & time: 04/19/22  1601     History  Chief Complaint  Patient presents with   Jaw Pain    Judy Chang is a 72 y.o. female.  72 year old female who presents to the emergency department with jaw pain after a fall.  Patient reports that she was getting up from bed and slipped and fell striking her jaw.  Says that since then she has had difficulty opening her mouth all the way and has had significant pain and swelling.  Has been trying ice as well as her home oxycodone but with the pain persisting she went to urgent care who referred her to the emergency department for imaging.  Also says she has had a mild headache and neck pain.  No numbness or weakness of her arms or legs.  Does think she cracked a tooth but does not have any loose teeth.  Is not on blood thinners.       Home Medications Prior to Admission medications   Medication Sig Start Date End Date Taking? Authorizing Provider  amLODipine (NORVASC) 10 MG tablet Take 5-10 mg by mouth See admin instructions. Take 5 mg on Mon, Wed, and Fri Take 10 mg on Sun, Tues, Thurs, and Sat    [provider]  amLODipine (NORVASC) 5 MG tablet Take 1 tablet (5 mg total) by mouth daily. Patient taking differently: Take 5 mg by mouth daily. TAKE 5MG  M-W-F 06/24/21   Belva Crome, MD  buPROPion (WELLBUTRIN XL) 150 MG 24 hr tablet Take 150 mg by mouth every morning. 03/12/21   [provider]  carvedilol (COREG) 12.5 MG tablet Take 1 tablet (12.5 mg total) by mouth 2 (two) times daily with a meal. 02/17/19   Oswald Hillock, MD  cloNIDine (CATAPRES - DOSED IN MG/24 HR) 0.2 mg/24hr patch Place 1 patch (0.2 mg total) onto the skin once a week. 02/23/19   Oswald Hillock, MD  DULoxetine (CYMBALTA) 30 MG capsule Take 90 mg by mouth daily. 03/12/21   [provider]  levETIRAcetam (KEPPRA) 500 MG tablet Take 1 tablet (500 mg  total) by mouth every 12 (twelve) hours. 02/17/19   Oswald Hillock, MD  LORazepam (ATIVAN) 0.5 MG tablet Take 0.5 mg by mouth daily. 03/12/21   [provider]  Oxycodone HCl 20 MG TABS Take 20 mg by mouth 4 (four) times daily as needed (pain). 10/29/14   [provider]  traZODone (DESYREL) 100 MG tablet Take 100 mg by mouth at bedtime. 03/12/21   [provider]      Allergies    Citalopram hydrobromide, Pregabalin, and Nsaids    Review of Systems   Review of Systems  Physical Exam Updated Vital Signs BP (!) 156/65 (BP Location: Right Arm)   Pulse 100   Temp 97.8 F (36.6 C) (Oral)   Resp 17   Wt 50 kg   SpO2 96%   BMI 20.49 kg/m  Physical Exam Vitals and nursing note reviewed.  Constitutional:      General: She is not in acute distress.    Appearance: She is well-developed.  HENT:     Head: Normocephalic.     Comments: Swelling to right side of jaw.  Trismus present.  Midline C-spine tenderness palpation at approximately C6.    Right Ear: External ear normal.     Left Ear: External ear normal.  Nose: Nose normal.  Eyes:     Extraocular Movements: Extraocular movements intact.     Conjunctiva/sclera: Conjunctivae normal.     Pupils: Pupils are equal, round, and reactive to light.  Pulmonary:     Effort: Pulmonary effort is normal. No respiratory distress.  Musculoskeletal:     Cervical back: Normal range of motion and neck supple.     Right lower leg: No edema.  Skin:    General: Skin is warm and dry.  Neurological:     Mental Status: She is alert and oriented to person, place, and time. Mental status is at baseline.  Psychiatric:        Mood and Affect: Mood normal.     ED Results / Procedures / Treatments   Labs (all labs ordered are listed, but only abnormal results are displayed) Labs Reviewed - No data to display  EKG None  Radiology CT Head Wo Contrast  Result Date: 04/19/2022 CLINICAL DATA:  head trauma; R jaw pain and  swelling after fall; neck pain sp fall EXAM: CT HEAD WITHOUT CONTRAST CT MAXILLOFACIAL WITHOUT CONTRAST CT CERVICAL SPINE WITHOUT CONTRAST TECHNIQUE: Multidetector CT imaging of the head, cervical spine, and maxillofacial structures were performed using the standard protocol without intravenous contrast. Multiplanar CT image reconstructions of the cervical spine and maxillofacial structures were also generated. RADIATION DOSE REDUCTION: This exam was performed according to the departmental dose-optimization program which includes automated exposure control, adjustment of the mA and/or kV according to patient size and/or use of iterative reconstruction technique. COMPARISON:  X-ray cervical spine 10/28/2009 FINDINGS: CT HEAD FINDINGS Brain: Cerebral ventricle sizes are concordant with the degree of cerebral volume loss. Patchy and confluent areas of decreased attenuation are noted throughout the deep and periventricular white matter of the cerebral hemispheres bilaterally, compatible with chronic microvascular ischemic disease. no evidence of large-territorial acute infarction. No parenchymal hemorrhage. No mass lesion. No extra-axial collection. No mass effect or midline shift. No hydrocephalus. Basilar cisterns are patent. Vascular: No hyperdense vessel. Skull: No acute fracture or focal lesion. Other: None. CT MAXILLOFACIAL FINDINGS Osseous: Acute comminuted and minimally displaced right mandibular body/angle fracture. No destructive process. The maxilla is edentulous. Sinuses/Orbits: Paranasal sinuses and mastoid air cells are clear. Bilateral lens replacement. Otherwise the orbits are unremarkable. Soft tissues: Negative. CT CERVICAL SPINE FINDINGS Alignment: Mild retrolisthesis of C3 on C4. Skull base and vertebrae: Multilevel degenerative changes spine most prominent at the C3-C4 level. No acute fracture. No aggressive appearing focal osseous lesion or focal pathologic process. Soft tissues and spinal canal:  No prevertebral fluid or swelling. No visible canal hematoma. Upper chest: Mild biapical paraseptal emphysematous changes. Other: Enlarged heterogeneous thyroid gland. This has been evaluated on previous imaging. (ref: J Am Coll Radiol. 2015 Feb;12(2): 143-50).Atherosclerotic plaque of the carotid arteries within the neck. IMPRESSION: 1. No acute intracranial abnormality. 2. Acute comminuted and minimally displaced right mandibular body/angle fracture. 3. No acute displaced fracture or traumatic listhesis of the cervical spine. 4.  Emphysema (ICD10-J43.9). Electronically Signed   By: Iven Finn M.D.   On: 04/19/2022 18:02   CT Cervical Spine Wo Contrast  Result Date: 04/19/2022 CLINICAL DATA:  head trauma; R jaw pain and swelling after fall; neck pain sp fall EXAM: CT HEAD WITHOUT CONTRAST CT MAXILLOFACIAL WITHOUT CONTRAST CT CERVICAL SPINE WITHOUT CONTRAST TECHNIQUE: Multidetector CT imaging of the head, cervical spine, and maxillofacial structures were performed using the standard protocol without intravenous contrast. Multiplanar CT image reconstructions of the cervical spine and maxillofacial  structures were also generated. RADIATION DOSE REDUCTION: This exam was performed according to the departmental dose-optimization program which includes automated exposure control, adjustment of the mA and/or kV according to patient size and/or use of iterative reconstruction technique. COMPARISON:  X-ray cervical spine 10/28/2009 FINDINGS: CT HEAD FINDINGS Brain: Cerebral ventricle sizes are concordant with the degree of cerebral volume loss. Patchy and confluent areas of decreased attenuation are noted throughout the deep and periventricular white matter of the cerebral hemispheres bilaterally, compatible with chronic microvascular ischemic disease. no evidence of large-territorial acute infarction. No parenchymal hemorrhage. No mass lesion. No extra-axial collection. No mass effect or midline shift. No  hydrocephalus. Basilar cisterns are patent. Vascular: No hyperdense vessel. Skull: No acute fracture or focal lesion. Other: None. CT MAXILLOFACIAL FINDINGS Osseous: Acute comminuted and minimally displaced right mandibular body/angle fracture. No destructive process. The maxilla is edentulous. Sinuses/Orbits: Paranasal sinuses and mastoid air cells are clear. Bilateral lens replacement. Otherwise the orbits are unremarkable. Soft tissues: Negative. CT CERVICAL SPINE FINDINGS Alignment: Mild retrolisthesis of C3 on C4. Skull base and vertebrae: Multilevel degenerative changes spine most prominent at the C3-C4 level. No acute fracture. No aggressive appearing focal osseous lesion or focal pathologic process. Soft tissues and spinal canal: No prevertebral fluid or swelling. No visible canal hematoma. Upper chest: Mild biapical paraseptal emphysematous changes. Other: Enlarged heterogeneous thyroid gland. This has been evaluated on previous imaging. (ref: J Am Coll Radiol. 2015 Feb;12(2): 143-50).Atherosclerotic plaque of the carotid arteries within the neck. IMPRESSION: 1. No acute intracranial abnormality. 2. Acute comminuted and minimally displaced right mandibular body/angle fracture. 3. No acute displaced fracture or traumatic listhesis of the cervical spine. 4.  Emphysema (ICD10-J43.9). Electronically Signed   By: Iven Finn M.D.   On: 04/19/2022 18:02   CT Maxillofacial Wo Contrast  Result Date: 04/19/2022 CLINICAL DATA:  head trauma; R jaw pain and swelling after fall; neck pain sp fall EXAM: CT HEAD WITHOUT CONTRAST CT MAXILLOFACIAL WITHOUT CONTRAST CT CERVICAL SPINE WITHOUT CONTRAST TECHNIQUE: Multidetector CT imaging of the head, cervical spine, and maxillofacial structures were performed using the standard protocol without intravenous contrast. Multiplanar CT image reconstructions of the cervical spine and maxillofacial structures were also generated. RADIATION DOSE REDUCTION: This exam was  performed according to the departmental dose-optimization program which includes automated exposure control, adjustment of the mA and/or kV according to patient size and/or use of iterative reconstruction technique. COMPARISON:  X-ray cervical spine 10/28/2009 FINDINGS: CT HEAD FINDINGS Brain: Cerebral ventricle sizes are concordant with the degree of cerebral volume loss. Patchy and confluent areas of decreased attenuation are noted throughout the deep and periventricular white matter of the cerebral hemispheres bilaterally, compatible with chronic microvascular ischemic disease. no evidence of large-territorial acute infarction. No parenchymal hemorrhage. No mass lesion. No extra-axial collection. No mass effect or midline shift. No hydrocephalus. Basilar cisterns are patent. Vascular: No hyperdense vessel. Skull: No acute fracture or focal lesion. Other: None. CT MAXILLOFACIAL FINDINGS Osseous: Acute comminuted and minimally displaced right mandibular body/angle fracture. No destructive process. The maxilla is edentulous. Sinuses/Orbits: Paranasal sinuses and mastoid air cells are clear. Bilateral lens replacement. Otherwise the orbits are unremarkable. Soft tissues: Negative. CT CERVICAL SPINE FINDINGS Alignment: Mild retrolisthesis of C3 on C4. Skull base and vertebrae: Multilevel degenerative changes spine most prominent at the C3-C4 level. No acute fracture. No aggressive appearing focal osseous lesion or focal pathologic process. Soft tissues and spinal canal: No prevertebral fluid or swelling. No visible canal hematoma. Upper chest: Mild biapical paraseptal emphysematous  changes. Other: Enlarged heterogeneous thyroid gland. This has been evaluated on previous imaging. (ref: J Am Coll Radiol. 2015 Feb;12(2): 143-50).Atherosclerotic plaque of the carotid arteries within the neck. IMPRESSION: 1. No acute intracranial abnormality. 2. Acute comminuted and minimally displaced right mandibular body/angle fracture.  3. No acute displaced fracture or traumatic listhesis of the cervical spine. 4.  Emphysema (ICD10-J43.9). Electronically Signed   By: Iven Finn M.D.   On: 04/19/2022 18:02    Procedures Procedures   Medications Ordered in ED Medications  oxyCODONE (Oxy IR/ROXICODONE) immediate release tablet 20 mg (20 mg Oral Given 04/19/22 1701)    ED Course/ Medical Decision Making/ A&P Clinical Course as of 04/19/22 Bosie Helper  Tue Apr 19, 2022  1806 CT Maxillofacial Wo Contrast Acute comminuted and minimally displaced right mandibular body/angle fracture. [RP]  1806 Spoke with on call dentist. Says that covering physician would either be ENT or plastic surgery. No OMFS available.  [RP]  [id number] Spoke with Dr Helen Hashimoto from ENT. Says that Mingo Amber MD is on call for trauma [phone].  [RP]  [id number] Dr Claudia Desanctis recommends soft diet and OP fu with him.  [RP]    Clinical Course User Index [RP] Fransico Meadow, MD                            Medical Decision Making Amount and/or Complexity of Data Reviewed Radiology: ordered. Decision-making details documented in ED Course.  Risk Prescription drug management.   Judy Chang is a 72 y.o. female who presents to the emergency department with jaw pain and swelling  Initial Ddx:  Jaw fracture, contusion, TBI, C-spine injury  MDM:  Concern the patient may have a jaw fracture given her trismus and significant swelling on exam.  May also represent a soft tissue contusion of her imaging is negative.  With her headache and neck pain we will obtain a CT of the head and C-spine especially with her age.  Plan:  Oxycodone CT head, C-spine, max face  ED Summary/Re-evaluation:  Patient underwent the above imaging which showed that the patient had a comminuted minimally displaced fracture of her right mandible.  Discussed with on-call facial surgeon Dr. Claudia Desanctis who recommended outpatient follow-up.  He will review the imaging and call the patient about an  appointment.  Also recommend soft diet for the patient.  She already is prescribed home 20 mg of oxycodone so we will continue this for her pain and tell her to ice as well.  This patient presents to the ED for concern of complaints listed in HPI, this involves an extensive number of treatment options, and is a complaint that carries with it a high risk of complications and morbidity. Disposition including potential need for admission considered.   Dispo: DC Home. Return precautions discussed including, but not limited to, those listed in the AVS. Allowed pt time to ask questions which were answered fully prior to dc.  Additional history obtained from spouse Records reviewed Outpatient Clinic Notes I independently reviewed the following imaging with scope of interpretation limited to determining acute life threatening conditions related to emergency care:  CT face  and agree with the radiologist interpretation with the following exceptions: none I have reviewed the patients home medications and made adjustments as needed Consults:  ENT Social Determinants of health:  Elderly   Final Clinical Impression(s) / ED Diagnoses Final diagnoses:  Closed fracture of right mandibular angle, initial encounter ([place])  Traumatic injury  of head, initial encounter  Neck pain  Fall, initial encounter    Rx / DC Orders ED Discharge Orders     None         Fransico Meadow, MD 04/19/22 Bosie Helper

## 2022-04-19 NOTE — ED Provider Notes (Signed)
[place]    CSN: [id number] Arrival date & time: 04/19/22  1420      History   Chief Complaint Chief Complaint  Patient presents with   Fall    HPI Judy Chang is a 72 y.o. female.    Fall   Here for pain in her right temple and right jaw.  On March 24 she fell forward and struck her face and right side of her head on the floor.  She has pain and swelling around her right jaw and some pain around her right temple.  No loss of consciousness.  She does not take any blood thinners.  She does already take oxycodone 20 mg 4 times a day as needed for pain.  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   pt unaware   Anxiety    Arthritis    Avascular necrosis ([place])    Chronic left femoral head   Benign essential HTN 10/23/2015   Chondromalacia 02/25/2018   Noted on MRI: Mild   DDD (degenerative disc disease), lumbar 01/10/2011   Noted on MRI   Depression    DJD (degenerative joint disease)    GERD (gastroesophageal reflux disease)    History of cataract    Bilateral   Hypercholesterolemia    Hypertension    Labial tear 02/25/2018   Noted on MRI: smal Left anterior    Lumbar spondylosis 01/10/2011   Noted on MRI   Migraines    Renal cyst 11/17/2015   Notedon [place] Abd: Simple, Right   Spinal stenosis 09/06/2016   Noted on MRI: Moderate-Severe   Vitreous floater, bilateral     Patient Active Problem List   Diagnosis Date Noted   Protein-calorie malnutrition, severe ([place]) 05/10/2021   Pulmonary hypertension ([place]) 05/10/2021   ARF (acute renal failure) ([place]) 05/08/2021   Bilateral lower extremity edema 05/08/2021   PRES (posterior reversible encephalopathy syndrome) 04/01/2019   Hypertensive emergency 04/01/2019   Status epilepticus ([place]) 02/07/2019   Primary osteoarthritis of left hip 06/22/2018   Benign essential HTN 10/23/2015   Adenoma of right adrenal gland  10/23/2015   Chest pain, neg MI, neg for pul. embolus, normal ETT 10/22/2015    Past Surgical History:  Procedure Laterality Date   COLONOSCOPY     PARTIAL HYSTERECTOMY     ROTATOR CUFF REPAIR Left    TONSILLECTOMY     TOTAL HIP ARTHROPLASTY Left 06/22/2018   Procedure: TOTAL HIP ARTHROPLASTY ANTERIOR APPROACH;  Surgeon: Dorna Leitz, MD;  Location: [facility] ORS;  Service: Orthopedics;  Laterality: Left;    OB History   No obstetric history on file.      Home Medications    Prior to Admission medications   Medication Sig Start Date End Date Taking? Authorizing Provider  amLODipine (NORVASC) 10 MG tablet Take 5-10 mg by mouth See admin instructions. Take 5 mg on Mon, Wed, and Fri Take 10 mg on Sun, Tues, Thurs, and Sat   Yes [provider]  amLODipine (NORVASC) 5 MG tablet Take 1 tablet (5 mg total) by mouth daily. Patient taking differently: Take 5 mg by mouth daily. TAKE 5MG  M-W-F 06/24/21  Yes Belva Crome, MD  buPROPion (WELLBUTRIN XL) 150 MG 24 hr tablet Take 150 mg by mouth every morning. 03/12/21  Yes [provider]  carvedilol (COREG) 12.5 MG tablet Take 1 tablet (12.5 mg total) by mouth 2 (two) times  daily with a meal. 02/17/19  Yes Darrick Meigs, Marge Duncans, MD  cloNIDine (CATAPRES - DOSED IN MG/24 HR) 0.2 mg/24hr patch Place 1 patch (0.2 mg total) onto the skin once a week. 02/23/19  Yes Oswald Hillock, MD  DULoxetine (CYMBALTA) 30 MG capsule Take 90 mg by mouth daily. 03/12/21  Yes [provider]  levETIRAcetam (KEPPRA) 500 MG tablet Take 1 tablet (500 mg total) by mouth every 12 (twelve) hours. 02/17/19  Yes Oswald Hillock, MD  LORazepam (ATIVAN) 0.5 MG tablet Take 0.5 mg by mouth daily. 03/12/21  Yes [provider]  Oxycodone HCl 20 MG TABS Take 20 mg by mouth 4 (four) times daily as needed (pain). 10/29/14  Yes [provider]  traZODone (DESYREL) 100 MG tablet Take 100 mg by mouth at bedtime. 03/12/21  Yes [provider]    Family  History Family History  Problem Relation Age of Onset   Hypertension Mother    Hypertension Father     Social History Social History   Tobacco Use   Smoking status: Every Day    Types: Cigars   Smokeless tobacco: Never   Tobacco comments:    off and on  Vaping Use   Vaping Use: Never used  Substance Use Topics   Alcohol use: Not Currently    Comment: every few days, beer   Drug use: Not Currently    Types: Marijuana    Comment: last time 06/13/2018     Allergies   Citalopram hydrobromide, Pregabalin, and Nsaids   Review of Systems Review of Systems   Physical Exam Triage Vital Signs ED Triage Vitals  Enc Vitals Group     BP 04/19/22 1506 139/71     Pulse Rate 04/19/22 1506 (!) 56     Resp 04/19/22 1506 18     Temp 04/19/22 1506 98 F (36.7 C)     Temp Source 04/19/22 1506 Oral     SpO2 04/19/22 1506 100 %     Weight --      Height --      Head Circumference --      Peak Flow --      Pain Score 04/19/22 1503 8     Pain Loc --      Pain Edu? --      Excl. in [place]? --    No data found.  Updated Vital Signs BP 139/71 (BP Location: Right Arm)   Pulse (!) 56   Temp 98 F (36.7 C) (Oral)   Resp 18   SpO2 100%   Visual Acuity Right Eye Distance:   Left Eye Distance:   Bilateral Distance:    Right Eye Near:   Left Eye Near:    Bilateral Near:     Physical Exam Vitals reviewed.  Constitutional:      General: She is not in acute distress.    Appearance: She is not ill-appearing, toxic-appearing or diaphoretic.  HENT:     Head:     Comments: There is swelling of her right jaw.  It is very tender.  She is unable to open her mouth appreciably here in the exam room.  She is alert and oriented Cardiovascular:     Rate and Rhythm: Normal rate and regular rhythm.  Skin:    Coloration: Skin is not pale.  Neurological:     Mental Status: She is alert and oriented to person, place, and time.  Psychiatric:        Behavior:  Behavior normal.      UC  Treatments / Results  Labs (all labs ordered are listed, but only abnormal results are displayed) Labs Reviewed - No data to display  EKG   Radiology No results found.  Procedures Procedures (including critical care time)  Medications Ordered in UC Medications - No data to display  Initial Impression / Assessment and Plan / UC Course  I have reviewed the triage vital signs and the nursing notes.  Pertinent labs & imaging results that were available during my care of the patient were reviewed by me and considered in my medical decision making (see chart for details).       I have asked her to proceed to the emergency room for higher level of evaluation and treatment then we provide here in urgent care setting.  We do not have [employer] capabilities here in urgent care.  Final Clinical Impressions(s) / UC Diagnoses   Final diagnoses:  Jaw pain     Discharge Instructions      Please proceed to the ER for evaluation and treatment    ED Prescriptions   None    I have reviewed the PDMP during this encounter.   Barrett Henle, MD 04/19/22 [phone]

## 2022-04-19 NOTE — Discharge Instructions (Signed)
Please proceed to the ER for evaluation and treatment

## 2022-04-19 NOTE — ED Notes (Signed)
Patient is being discharged from the Urgent Care and sent to the Emergency Department via POV . Per Sherene Sires MD, patient is in need of higher level of care due to fall, head injury. Patient is aware and verbalizes understanding of plan of care.  Vitals:   04/19/22 1506  BP: 139/71  Pulse: (!) 56  Resp: 18  Temp: 98 F (36.7 C)  SpO2: 100%

## 2022-04-19 NOTE — ED Triage Notes (Signed)
C/o mechanical fall x2 days ago with right sided jaw pain.  Pt reports forward fall and striking face and right side of head.  Swelling noted to right jaw. Tender on palpitation. Pt reports unable to open mouth completely.  Denies loc. Denies blood thinner usage.

## 2022-04-19 NOTE — Discharge Instructions (Signed)
You were seen for your broken jaw in the emergency department.   At home, please continue icing your pain medication.  Please start eating a soft diet since chewing may be difficult.    Follow-up with Dr. Claudia Desanctis (facial surgeon) in 2-3 days regarding your visit.    Return immediately to the emergency department if you experience any of the following: Difficulty breathing, or any other concerning symptoms.    Thank you for visiting our Emergency Department. It was a pleasure taking care of you today.

## 2022-04-19 NOTE — ED Triage Notes (Addendum)
Pt states she fell and hit the right side of her face on a table on Sunday she thought it would get better but she is having jaw pain still. She says her gums bleed. She has been taking oxycodone 20mg  without relief. Denies any LOC

## 2022-05-03 ENCOUNTER — Telehealth: Payer: Self-pay | Admitting: Pulmonary Disease

## 2022-05-03 NOTE — Telephone Encounter (Signed)
Pt's spouse came to the office today as he states that pt never heard about the results from ONO that pt had done.  When checking through pt's chart, I do not see where any results were ever scanned in. Called [employer] as that was the  DME that did pt's ONO and they stated that this was performed January 2024.   I have requested to have these results faxed over to our office so we can have Dr. Judeth Horn review.  Routing to April as well as Dr. Judeth Horn as an [place]ain Childes.

## 2022-05-04 NOTE — Telephone Encounter (Signed)
Have we received these results as of yet?

## 2022-05-05 NOTE — Telephone Encounter (Signed)
Attempted to call patient but no voicemail set up.   Per Dr Judeth Horn after reading Overnight Oxygen Test:  She is still needing to wear 2L of oxygen at night time  Will try again

## 2022-05-09 NOTE — Telephone Encounter (Signed)
Called and spoke with pt's spouse and went over the ONO results. Understanding was verbalized. Nothing further needed.

## 2022-05-09 NOTE — Telephone Encounter (Signed)
Attempted to call pt but unable to reach. Left message for her to return call. 

## 2022-05-09 NOTE — Telephone Encounter (Signed)
Mr. Venecia ret our calls. I read the note/results from April and he still would like a call back. He asked that we call the home # [phone].

## 2022-05-12 DIAGNOSIS — I272 Pulmonary hypertension, unspecified: Secondary | ICD-10-CM | POA: Diagnosis not present

## 2022-05-12 DIAGNOSIS — I161 Hypertensive emergency: Secondary | ICD-10-CM | POA: Diagnosis not present

## 2022-05-12 DIAGNOSIS — E43 Unspecified severe protein-calorie malnutrition: Secondary | ICD-10-CM | POA: Diagnosis not present

## 2022-05-14 DIAGNOSIS — I272 Pulmonary hypertension, unspecified: Secondary | ICD-10-CM | POA: Diagnosis not present

## 2022-05-14 DIAGNOSIS — E43 Unspecified severe protein-calorie malnutrition: Secondary | ICD-10-CM | POA: Diagnosis not present

## 2022-05-14 DIAGNOSIS — I161 Hypertensive emergency: Secondary | ICD-10-CM | POA: Diagnosis not present

## 2022-05-16 DIAGNOSIS — I5189 Other ill-defined heart diseases: Secondary | ICD-10-CM | POA: Diagnosis not present

## 2022-05-16 DIAGNOSIS — D3500 Benign neoplasm of unspecified adrenal gland: Secondary | ICD-10-CM | POA: Diagnosis not present

## 2022-05-16 DIAGNOSIS — N390 Urinary tract infection, site not specified: Secondary | ICD-10-CM | POA: Diagnosis not present

## 2022-05-16 DIAGNOSIS — N184 Chronic kidney disease, stage 4 (severe): Secondary | ICD-10-CM | POA: Diagnosis not present

## 2022-05-16 DIAGNOSIS — I129 Hypertensive chronic kidney disease with stage 1 through stage 4 chronic kidney disease, or unspecified chronic kidney disease: Secondary | ICD-10-CM | POA: Diagnosis not present

## 2022-05-16 DIAGNOSIS — D631 Anemia in chronic kidney disease: Secondary | ICD-10-CM | POA: Diagnosis not present

## 2022-05-16 DIAGNOSIS — E049 Nontoxic goiter, unspecified: Secondary | ICD-10-CM | POA: Diagnosis not present

## 2022-05-16 DIAGNOSIS — N189 Chronic kidney disease, unspecified: Secondary | ICD-10-CM | POA: Diagnosis not present

## 2022-05-16 DIAGNOSIS — N2581 Secondary hyperparathyroidism of renal origin: Secondary | ICD-10-CM | POA: Diagnosis not present

## 2022-05-18 ENCOUNTER — Encounter: Payer: Self-pay | Admitting: Pulmonary Disease

## 2022-06-11 DIAGNOSIS — E43 Unspecified severe protein-calorie malnutrition: Secondary | ICD-10-CM | POA: Diagnosis not present

## 2022-06-11 DIAGNOSIS — I161 Hypertensive emergency: Secondary | ICD-10-CM | POA: Diagnosis not present

## 2022-06-11 DIAGNOSIS — I272 Pulmonary hypertension, unspecified: Secondary | ICD-10-CM | POA: Diagnosis not present

## 2022-06-13 DIAGNOSIS — I272 Pulmonary hypertension, unspecified: Secondary | ICD-10-CM | POA: Diagnosis not present

## 2022-06-13 DIAGNOSIS — E43 Unspecified severe protein-calorie malnutrition: Secondary | ICD-10-CM | POA: Diagnosis not present

## 2022-06-13 DIAGNOSIS — I161 Hypertensive emergency: Secondary | ICD-10-CM | POA: Diagnosis not present

## 2022-07-12 DIAGNOSIS — E43 Unspecified severe protein-calorie malnutrition: Secondary | ICD-10-CM | POA: Diagnosis not present

## 2022-07-12 DIAGNOSIS — I272 Pulmonary hypertension, unspecified: Secondary | ICD-10-CM | POA: Diagnosis not present

## 2022-07-12 DIAGNOSIS — I161 Hypertensive emergency: Secondary | ICD-10-CM | POA: Diagnosis not present

## 2022-07-14 DIAGNOSIS — E43 Unspecified severe protein-calorie malnutrition: Secondary | ICD-10-CM | POA: Diagnosis not present

## 2022-07-14 DIAGNOSIS — I272 Pulmonary hypertension, unspecified: Secondary | ICD-10-CM | POA: Diagnosis not present

## 2022-07-14 DIAGNOSIS — I161 Hypertensive emergency: Secondary | ICD-10-CM | POA: Diagnosis not present

## 2022-07-21 DIAGNOSIS — D631 Anemia in chronic kidney disease: Secondary | ICD-10-CM | POA: Diagnosis not present

## 2022-07-21 DIAGNOSIS — N2581 Secondary hyperparathyroidism of renal origin: Secondary | ICD-10-CM | POA: Diagnosis not present

## 2022-07-21 DIAGNOSIS — I120 Hypertensive chronic kidney disease with stage 5 chronic kidney disease or end stage renal disease: Secondary | ICD-10-CM | POA: Diagnosis not present

## 2022-07-21 DIAGNOSIS — N185 Chronic kidney disease, stage 5: Secondary | ICD-10-CM | POA: Diagnosis not present

## 2022-08-10 DIAGNOSIS — I5189 Other ill-defined heart diseases: Secondary | ICD-10-CM | POA: Diagnosis not present

## 2022-08-10 DIAGNOSIS — N189 Chronic kidney disease, unspecified: Secondary | ICD-10-CM | POA: Diagnosis not present

## 2022-08-10 DIAGNOSIS — D3500 Benign neoplasm of unspecified adrenal gland: Secondary | ICD-10-CM | POA: Diagnosis not present

## 2022-08-10 DIAGNOSIS — N185 Chronic kidney disease, stage 5: Secondary | ICD-10-CM | POA: Diagnosis not present

## 2022-08-10 DIAGNOSIS — N2581 Secondary hyperparathyroidism of renal origin: Secondary | ICD-10-CM | POA: Diagnosis not present

## 2022-08-10 DIAGNOSIS — D631 Anemia in chronic kidney disease: Secondary | ICD-10-CM | POA: Diagnosis not present

## 2022-08-10 DIAGNOSIS — I120 Hypertensive chronic kidney disease with stage 5 chronic kidney disease or end stage renal disease: Secondary | ICD-10-CM | POA: Diagnosis not present

## 2022-08-11 DIAGNOSIS — I272 Pulmonary hypertension, unspecified: Secondary | ICD-10-CM | POA: Diagnosis not present

## 2022-08-11 DIAGNOSIS — E43 Unspecified severe protein-calorie malnutrition: Secondary | ICD-10-CM | POA: Diagnosis not present

## 2022-08-11 DIAGNOSIS — I161 Hypertensive emergency: Secondary | ICD-10-CM | POA: Diagnosis not present

## 2022-08-13 DIAGNOSIS — I272 Pulmonary hypertension, unspecified: Secondary | ICD-10-CM | POA: Diagnosis not present

## 2022-08-13 DIAGNOSIS — I161 Hypertensive emergency: Secondary | ICD-10-CM | POA: Diagnosis not present

## 2022-08-13 DIAGNOSIS — E43 Unspecified severe protein-calorie malnutrition: Secondary | ICD-10-CM | POA: Diagnosis not present

## 2022-09-11 DIAGNOSIS — I161 Hypertensive emergency: Secondary | ICD-10-CM | POA: Diagnosis not present

## 2022-09-11 DIAGNOSIS — E43 Unspecified severe protein-calorie malnutrition: Secondary | ICD-10-CM | POA: Diagnosis not present

## 2022-09-11 DIAGNOSIS — I272 Pulmonary hypertension, unspecified: Secondary | ICD-10-CM | POA: Diagnosis not present

## 2022-09-13 DIAGNOSIS — I161 Hypertensive emergency: Secondary | ICD-10-CM | POA: Diagnosis not present

## 2022-09-13 DIAGNOSIS — I272 Pulmonary hypertension, unspecified: Secondary | ICD-10-CM | POA: Diagnosis not present

## 2022-09-13 DIAGNOSIS — E43 Unspecified severe protein-calorie malnutrition: Secondary | ICD-10-CM | POA: Diagnosis not present

## 2022-09-16 ENCOUNTER — Emergency Department (HOSPITAL_COMMUNITY): Payer: Medicare Other

## 2022-09-16 ENCOUNTER — Observation Stay (HOSPITAL_COMMUNITY)
Admission: EM | Admit: 2022-09-16 | Discharge: 2022-09-17 | Disposition: A | Discharge status: Home / Self Care | Payer: Medicare Other | Attending: Internal Medicine | Admitting: Internal Medicine

## 2022-09-16 ENCOUNTER — Encounter (HOSPITAL_COMMUNITY): Payer: Self-pay

## 2022-09-16 ENCOUNTER — Other Ambulatory Visit: Payer: Self-pay

## 2022-09-16 DIAGNOSIS — F0390 Unspecified dementia without behavioral disturbance: Secondary | ICD-10-CM | POA: Insufficient documentation

## 2022-09-16 DIAGNOSIS — I10 Essential (primary) hypertension: Secondary | ICD-10-CM | POA: Diagnosis present

## 2022-09-16 DIAGNOSIS — W19XXXA Unspecified fall, initial encounter: Secondary | ICD-10-CM | POA: Diagnosis not present

## 2022-09-16 DIAGNOSIS — D62 Acute posthemorrhagic anemia: Secondary | ICD-10-CM | POA: Insufficient documentation

## 2022-09-16 DIAGNOSIS — I6529 Occlusion and stenosis of unspecified carotid artery: Secondary | ICD-10-CM | POA: Diagnosis not present

## 2022-09-16 DIAGNOSIS — D649 Anemia, unspecified: Secondary | ICD-10-CM | POA: Diagnosis present

## 2022-09-16 DIAGNOSIS — N179 Acute kidney failure, unspecified: Secondary | ICD-10-CM | POA: Diagnosis present

## 2022-09-16 DIAGNOSIS — Z043 Encounter for examination and observation following other accident: Secondary | ICD-10-CM | POA: Diagnosis not present

## 2022-09-16 DIAGNOSIS — Z96642 Presence of left artificial hip joint: Secondary | ICD-10-CM | POA: Diagnosis not present

## 2022-09-16 DIAGNOSIS — S066X9A Traumatic subarachnoid hemorrhage with loss of consciousness of unspecified duration, initial encounter: Principal | ICD-10-CM | POA: Insufficient documentation

## 2022-09-16 DIAGNOSIS — Y92009 Unspecified place in unspecified non-institutional (private) residence as the place of occurrence of the external cause: Secondary | ICD-10-CM

## 2022-09-16 DIAGNOSIS — F17290 Nicotine dependence, other tobacco product, uncomplicated: Secondary | ICD-10-CM | POA: Diagnosis not present

## 2022-09-16 DIAGNOSIS — I609 Nontraumatic subarachnoid hemorrhage, unspecified: Secondary | ICD-10-CM

## 2022-09-16 DIAGNOSIS — S199XXA Unspecified injury of neck, initial encounter: Secondary | ICD-10-CM | POA: Diagnosis not present

## 2022-09-16 DIAGNOSIS — Z87898 Personal history of other specified conditions: Secondary | ICD-10-CM

## 2022-09-16 DIAGNOSIS — I6782 Cerebral ischemia: Secondary | ICD-10-CM | POA: Diagnosis not present

## 2022-09-16 DIAGNOSIS — S0291XA Unspecified fracture of skull, initial encounter for closed fracture: Secondary | ICD-10-CM | POA: Diagnosis not present

## 2022-09-16 DIAGNOSIS — Z23 Encounter for immunization: Secondary | ICD-10-CM | POA: Diagnosis not present

## 2022-09-16 DIAGNOSIS — I672 Cerebral atherosclerosis: Secondary | ICD-10-CM | POA: Diagnosis not present

## 2022-09-16 DIAGNOSIS — R410 Disorientation, unspecified: Secondary | ICD-10-CM | POA: Diagnosis not present

## 2022-09-16 DIAGNOSIS — W010XXA Fall on same level from slipping, tripping and stumbling without subsequent striking against object, initial encounter: Secondary | ICD-10-CM | POA: Diagnosis not present

## 2022-09-16 DIAGNOSIS — F411 Generalized anxiety disorder: Secondary | ICD-10-CM | POA: Diagnosis present

## 2022-09-16 DIAGNOSIS — N184 Chronic kidney disease, stage 4 (severe): Secondary | ICD-10-CM | POA: Diagnosis not present

## 2022-09-16 DIAGNOSIS — I129 Hypertensive chronic kidney disease with stage 1 through stage 4 chronic kidney disease, or unspecified chronic kidney disease: Secondary | ICD-10-CM | POA: Diagnosis not present

## 2022-09-16 DIAGNOSIS — G44309 Post-traumatic headache, unspecified, not intractable: Secondary | ICD-10-CM | POA: Diagnosis not present

## 2022-09-16 DIAGNOSIS — R6889 Other general symptoms and signs: Secondary | ICD-10-CM | POA: Diagnosis not present

## 2022-09-16 HISTORY — DX: Unspecified dementia, unspecified severity, without behavioral disturbance, psychotic disturbance, mood disturbance, and anxiety: F03.90

## 2022-09-16 HISTORY — DX: Unspecified convulsions: R56.9

## 2022-09-16 MED ORDER — TETANUS-DIPHTH-ACELL PERTUSSIS 5-2.5-18.5 LF-MCG/0.5 IM SUSY
0.5000 mL | PREFILLED_SYRINGE | Freq: Once | INTRAMUSCULAR | Status: AC
  Administered 2022-09-16: 0.5 mL via INTRAMUSCULAR
  Filled 2022-09-16: qty 0.5

## 2022-09-16 MED ORDER — HYDRALAZINE HCL 25 MG PO TABS
25.0000 mg | ORAL_TABLET | Freq: Once | ORAL | Status: AC
  Administered 2022-09-16: 25 mg via ORAL
  Filled 2022-09-16: qty 1

## 2022-09-16 MED ORDER — HYDRALAZINE HCL 20 MG/ML IJ SOLN: 10.0000 mg | Freq: Once | INTRAMUSCULAR | Status: DC

## 2022-09-16 NOTE — ED Notes (Signed)
EMS bandaging removed, revealing approximate 1 inch laceration to left of left eye.

## 2022-09-16 NOTE — ED Notes (Signed)
Patients husband was rude to staff, yelling and cursing at phlebotomy staff. He was escorted out by [facility] and security. Patient now refusing blood samples.

## 2022-09-16 NOTE — ED Triage Notes (Signed)
Patient BIB [facility] from home for mechanical fall after getting tripped up in the drive way. Patient is A&Ox1 at baseline, no thinners but doesn't remember the fall. Patient also has history of hypertension, BP was 184/78, HR 80, 97% on RA

## 2022-09-16 NOTE — ED Provider Notes (Signed)
[place] EMERGENCY DEPARTMENT AT [facility] Provider Note   CSN: [id number] Arrival date & time: 09/16/22  2035     History  Chief Complaint  Patient presents with   Marletta Lor    DAKIYA WINICK is a 72 y.o. female.  HPI 72 year old female with a history of hypercholesteremia, hypertension, migraines, press, avascular necrosis, degenerative disc disease, GERD, depression, anxiety presents to the ER after a fall.  Patient is a unreliable historian, and her husband and son at bedside were not present for the fall.  Patient's granddaughter was there but they were unable to obtain accurate history.  Unclear if this was a mechanical fall or not.  Initially it was reported that the patient got tripped up on the driveway.  Patient cannot tell me the event surrounding the fall.  She is alert to person and place but not time.  She has a small abrasion just above her left eye.  She denies any chest pain, shortness of breath.  States she has been in her normal state of health.  Denies any numbness or tingling her extremities.    Home Medications Prior to Admission medications   Medication Sig Start Date End Date Taking? Authorizing Provider  amLODipine (NORVASC) 10 MG tablet Take 5-10 mg by mouth See admin instructions. Take 5 mg on Mon, Wed, and Fri Take 10 mg on Sun, Tues, Thurs, and Sat    [provider]  amLODipine (NORVASC) 5 MG tablet Take 1 tablet (5 mg total) by mouth daily. Patient taking differently: Take 5 mg by mouth daily. TAKE 5MG  M-W-F 06/24/21   Lyn Records, MD  buPROPion (WELLBUTRIN XL) 150 MG 24 hr tablet Take 150 mg by mouth every morning. 03/12/21   [provider]  carvedilol (COREG) 12.5 MG tablet Take 1 tablet (12.5 mg total) by mouth 2 (two) times daily with a meal. 02/17/19   Meredeth Ide, MD  cloNIDine (CATAPRES - DOSED IN MG/24 HR) 0.2 mg/24hr patch Place 1 patch (0.2 mg total) onto the skin once a week. 02/23/19   Meredeth Ide, MD   DULoxetine (CYMBALTA) 30 MG capsule Take 90 mg by mouth daily. 03/12/21   [provider]  levETIRAcetam (KEPPRA) 500 MG tablet Take 1 tablet (500 mg total) by mouth every 12 (twelve) hours. 02/17/19   Meredeth Ide, MD  LORazepam (ATIVAN) 0.5 MG tablet Take 0.5 mg by mouth daily. 03/12/21   [provider]  Oxycodone HCl 20 MG TABS Take 20 mg by mouth 4 (four) times daily as needed (pain). 10/29/14   [provider]  traZODone (DESYREL) 100 MG tablet Take 100 mg by mouth at bedtime. 03/12/21   [provider]      Allergies    Citalopram hydrobromide, Pregabalin, and Nsaids    Review of Systems   Review of Systems Ten systems reviewed and are negative for acute change, except as noted in the HPI.   Physical Exam Updated Vital Signs BP (!) 142/70   Pulse 67   Temp 97.9 F (36.6 C)   Resp 11   SpO2 98%  Physical Exam Vitals and nursing note reviewed.  Constitutional:      General: She is not in acute distress.    Appearance: She is well-developed.  HENT:     Head: Normocephalic and atraumatic.     Comments: No of hemotympanum, raccoon eyes, battle sign.  No mastoid tenderness.  No malocclusion.  No cranial deformities. Full range of  motion of head and neck    Eyes:     Conjunctiva/sclera: Conjunctivae normal.  Cardiovascular:     Rate and Rhythm: Normal rate and regular rhythm.     Heart sounds: No murmur heard. Pulmonary:     Effort: Pulmonary effort is normal. No respiratory distress.     Breath sounds: Normal breath sounds.  Abdominal:     Palpations: Abdomen is soft.     Tenderness: There is no abdominal tenderness.  Musculoskeletal:        General: No swelling.     Cervical back: Neck supple.     Comments: No midline tenderness of the C, T, L-spine.  No internal/external rotation of the lower extremities.  Skin:    General: Skin is warm and dry.     Capillary Refill: Capillary refill takes less than 2 seconds.     Findings:  Lesion present.     Comments: Small laceration above the left eyebrow, bleeding controlled.  Neurological:     Mental Status: She is alert.     Comments: A&O x 2.  Equal strength in upper lower extremities bilaterally.  Sensations intact.  Cranial nerves intact.  Psychiatric:        Mood and Affect: Mood normal.     ED Results / Procedures / Treatments   Labs (all labs ordered are listed, but only abnormal results are displayed) Labs Reviewed  CBC WITH DIFFERENTIAL/PLATELET  BASIC METABOLIC PANEL  URINALYSIS, ROUTINE W REFLEX MICROSCOPIC    EKG None  Radiology DG Chest 2 View  Result Date: 09/16/2022 CLINICAL DATA:  Fall. EXAM: CHEST - 2 VIEW COMPARISON:  05/08/2021 FINDINGS: Rotated exam. Normal heart size and mediastinal contours allowing for rotation. No pneumothorax, pleural effusion or focal airspace disease. On limited assessment, no acute osseous findings. IMPRESSION: Rotated exam without acute finding. Electronically Signed   By: Narda Rutherford M.D.   On: 09/16/2022 22:58   CT Cervical Spine Wo Contrast  Result Date: 09/16/2022 CLINICAL DATA:  Neck trauma (Age >= 65y) Fall with laceration above left eyebrow. EXAM: CT CERVICAL SPINE WITHOUT CONTRAST TECHNIQUE: Multidetector CT imaging of the cervical spine was performed without intravenous contrast. Multiplanar CT image reconstructions were also generated. RADIATION DOSE REDUCTION: This exam was performed according to the departmental dose-optimization program which includes automated exposure control, adjustment of the mA and/or kV according to patient size and/or use of iterative reconstruction technique. COMPARISON:  04/19/2022 FINDINGS: Alignment: Slight levo scoliotic curvature. No traumatic subluxation. Skull base and vertebrae: No acute fracture. Vertebral body heights are maintained. Schmorl's node within the inferior endplate of C3, chronic. The dens and skull base are intact. Soft tissues and spinal canal: No  prevertebral fluid or swelling. No visible canal hematoma. Disc levels:  Degenerative disc disease is most prominent at C3-C4. Upper chest: Heterogeneously enlarged thyroid gland. This has been evaluated on previous imaging. (ref: J Am Coll Radiol. 2015 Feb;12(2): 143-50).Thyroid ultrasound 05/08/2021 Other: Carotid calcifications. IMPRESSION: Degenerative change in the cervical spine without acute fracture or subluxation. Electronically Signed   By: Narda Rutherford M.D.   On: 09/16/2022 22:38   CT Head Wo Contrast  Result Date: 09/16/2022 CLINICAL DATA:  Headache, post traumatic 72 year old post fall.  Laceration above left eye. EXAM: CT HEAD WITHOUT CONTRAST TECHNIQUE: Contiguous axial images were obtained from the base of the skull through the vertex without intravenous contrast. RADIATION DOSE REDUCTION: This exam was performed according to the departmental dose-optimization program which includes automated exposure control, adjustment of the  mA and/or kV according to patient size and/or use of iterative reconstruction technique. COMPARISON:  Head CT 04/19/2022 FINDINGS: Brain: Trace subarachnoid hemorrhage dependently in the occipital horn of the left lateral ventricle, series 2, image 13. No other evidence of hemorrhage. No subdural collection. Generalized atrophy and chronic small vessel ischemic change. No acute ischemia. No hydrocephalus or midline shift. Vascular: Atherosclerosis of skullbase vasculature without hyperdense vessel or abnormal calcification. Skull: No fracture or focal lesion. Sinuses/Orbits: No acute fracture. No mastoid effusion. Bilateral cataract resection. Other: No large scalp hematoma. IMPRESSION: 1. Trace subarachnoid hemorrhage dependently in the occipital horn of the left lateral ventricle. 2. Generalized atrophy and chronic small vessel ischemic change. Critical Value/emergent results were called by telephone at the time of interpretation on 09/16/2022 at 10:34 pm to provider  [facility] , who verbally acknowledged these results. Electronically Signed   By: Narda Rutherford M.D.   On: 09/16/2022 22:34   DG Pelvis 1-2 Views  Result Date: 09/16/2022 CLINICAL DATA:  Recent fall EXAM: PELVIS - 1-VIEW COMPARISON:  None Available. FINDINGS: Pelvic ring is intact. Left hip replacement is noted. No soft tissue changes are seen. No acute fracture is noted. IMPRESSION: No acute abnormality noted. Electronically Signed   By: Alcide Clever M.D.   On: 09/16/2022 22:33    Procedures Procedures    Medications Ordered in ED Medications  hydrALAZINE (APRESOLINE) injection 10 mg (has no administration in time range)  Tdap (BOOSTRIX) injection 0.5 mL (0.5 mLs Intramuscular Given 09/16/22 2120)  hydrALAZINE (APRESOLINE) tablet 25 mg (25 mg Oral Given 09/16/22 2241)    ED Course/ Medical Decision Making/ A&P                                 Medical Decision Making Amount and/or Complexity of Data Reviewed Labs: ordered. Radiology: ordered.  Risk Prescription drug management.  72 year old female presents to the ER after a fall.  On arrival, she is alert and oriented x 2, not alert to time, thinks it is 2004.  She was hypertensive with a blood pressure of 192/70 on arrival.  Does have a small abrasion to her left upper eyebrow, otherwise neuroexam is intact.  No focal neurologic deficits.  No evidence of leg shortening.  Abdomen is soft and nontender.  Attempted to obtain lab work however the patient is a difficult stick and her husband became increasingly verbally aggressive with our phlebotomist.  He was dismissed from the facility, and the patient refused any further lab work.  Chest x-ray ordered, reviewed and negative for acute findings.  CT of the head reviewed, received a call from radiology with a trace subarachnoid in the subdural hemorrhage left lateral ventricle.  CT of the cervical spine reviewed, with radiology read, negative for acute fractures.  Pelvic x-ray  negative for fractures or dislocations.  I discussed the patient's case with neurosurgery who stated there is no acute intervention indicated and no indication for admission from their perspective.  They do recommend admission if her blood pressure cannot be controlled.  The patient refused an IV, she was then 25 mg of p.o. hydralazine with some improvement in her blood pressure.  However in the setting of history of poorly controlled blood pressure and PRES in the setting of new trace subarachnoid, I believe the patient would benefit from admission for close blood pressure monitoring/control.  Consulted hospitalist for admission. Final Clinical Impression(s) / ED Diagnoses Final diagnoses:  None    Rx / DC Orders ED Discharge Orders     None         Leone Brand 09/16/22 2350    Melene Plan, DO 09/17/22 0900

## 2022-09-16 NOTE — ED Notes (Signed)
Posey alarm placed due to patient's baseline AMS.

## 2022-09-17 ENCOUNTER — Encounter (HOSPITAL_COMMUNITY): Payer: Self-pay | Admitting: Internal Medicine

## 2022-09-17 ENCOUNTER — Observation Stay (HOSPITAL_COMMUNITY): Payer: Medicare Other

## 2022-09-17 DIAGNOSIS — I609 Nontraumatic subarachnoid hemorrhage, unspecified: Principal | ICD-10-CM

## 2022-09-17 DIAGNOSIS — N184 Chronic kidney disease, stage 4 (severe): Secondary | ICD-10-CM | POA: Diagnosis not present

## 2022-09-17 DIAGNOSIS — D649 Anemia, unspecified: Secondary | ICD-10-CM | POA: Diagnosis not present

## 2022-09-17 DIAGNOSIS — W19XXXA Unspecified fall, initial encounter: Secondary | ICD-10-CM | POA: Diagnosis not present

## 2022-09-17 DIAGNOSIS — N179 Acute kidney failure, unspecified: Secondary | ICD-10-CM

## 2022-09-17 DIAGNOSIS — Z87898 Personal history of other specified conditions: Secondary | ICD-10-CM | POA: Diagnosis not present

## 2022-09-17 DIAGNOSIS — Y92009 Unspecified place in unspecified non-institutional (private) residence as the place of occurrence of the external cause: Secondary | ICD-10-CM

## 2022-09-17 DIAGNOSIS — I10 Essential (primary) hypertension: Secondary | ICD-10-CM

## 2022-09-17 DIAGNOSIS — N281 Cyst of kidney, acquired: Secondary | ICD-10-CM | POA: Diagnosis not present

## 2022-09-17 DIAGNOSIS — F411 Generalized anxiety disorder: Secondary | ICD-10-CM | POA: Diagnosis present

## 2022-09-17 LAB — PROTIME-INR
INR: 1 (ref 0.8–1.2)
Prothrombin Time: 13.1 seconds (ref 11.4–15.2)

## 2022-09-17 LAB — CBC WITH DIFFERENTIAL/PLATELET
Abs Immature Granulocytes: 0.02 10*3/uL (ref 0.00–0.07)
Abs Immature Granulocytes: 0.02 10*3/uL (ref 0.00–0.07)
Basophils Absolute: 0.1 10*3/uL (ref 0.0–0.1)
Basophils Absolute: 0.1 10*3/uL (ref 0.0–0.1)
Basophils Relative: 1 %
Basophils Relative: 1 %
Eosinophils Absolute: 0.2 10*3/uL (ref 0.0–0.5)
Eosinophils Absolute: 0.2 10*3/uL (ref 0.0–0.5)
Eosinophils Relative: 2 %
Eosinophils Relative: 3 %
HCT: 27.2 % — ABNORMAL LOW (ref 36.0–46.0)
HCT: 30 % — ABNORMAL LOW (ref 36.0–46.0)
Hemoglobin: 8.6 g/dL — ABNORMAL LOW (ref 12.0–15.0)
Hemoglobin: 9.1 g/dL — ABNORMAL LOW (ref 12.0–15.0)
Immature Granulocytes: 0 %
Immature Granulocytes: 0 %
Lymphocytes Relative: 15 %
Lymphocytes Relative: 18 %
Lymphs Abs: 1.3 10*3/uL (ref 0.7–4.0)
Lymphs Abs: 1.4 10*3/uL (ref 0.7–4.0)
MCH: 25.4 pg — ABNORMAL LOW (ref 26.0–34.0)
MCH: 26.9 pg (ref 26.0–34.0)
MCHC: 30.3 g/dL (ref 30.0–36.0)
MCHC: 31.6 g/dL (ref 30.0–36.0)
MCV: 83.8 fL (ref 80.0–100.0)
MCV: 85 fL (ref 80.0–100.0)
Monocytes Absolute: 0.6 10*3/uL (ref 0.1–1.0)
Monocytes Absolute: 0.7 10*3/uL (ref 0.1–1.0)
Monocytes Relative: 7 %
Monocytes Relative: 9 %
Neutro Abs: 5.6 10*3/uL (ref 1.7–7.7)
Neutro Abs: 6.5 10*3/uL (ref 1.7–7.7)
Neutrophils Relative %: 70 %
Neutrophils Relative %: 74 %
Platelets: 307 10*3/uL (ref 150–400)
Platelets: 343 10*3/uL (ref 150–400)
RBC: 3.2 MIL/uL — ABNORMAL LOW (ref 3.87–5.11)
RBC: 3.58 MIL/uL — ABNORMAL LOW (ref 3.87–5.11)
RDW: 19.9 % — ABNORMAL HIGH (ref 11.5–15.5)
RDW: 19.9 % — ABNORMAL HIGH (ref 11.5–15.5)
WBC: 8 10*3/uL (ref 4.0–10.5)
WBC: 8.6 10*3/uL (ref 4.0–10.5)
nRBC: 0 % (ref 0.0–0.2)
nRBC: 0 % (ref 0.0–0.2)

## 2022-09-17 LAB — TYPE AND SCREEN
ABO/RH(D): B NEG
Antibody Screen: NEGATIVE

## 2022-09-17 LAB — URINALYSIS, ROUTINE W REFLEX MICROSCOPIC
Bacteria, UA: NONE SEEN
Bilirubin Urine: NEGATIVE
Glucose, UA: NEGATIVE mg/dL
Ketones, ur: NEGATIVE mg/dL
Leukocytes,Ua: NEGATIVE
Nitrite: NEGATIVE
Protein, ur: >=300 mg/dL — AB
Specific Gravity, Urine: 1.01 (ref 1.005–1.030)
pH: 5 (ref 5.0–8.0)

## 2022-09-17 LAB — BASIC METABOLIC PANEL
Anion gap: 8 (ref 5–15)
BUN: 27 mg/dL — ABNORMAL HIGH (ref 8–23)
CO2: 23 mmol/L (ref 22–32)
Calcium: 9.3 mg/dL (ref 8.9–10.3)
Chloride: 102 mmol/L (ref 98–111)
Creatinine, Ser: 4.72 mg/dL — ABNORMAL HIGH (ref 0.44–1.00)
GFR, Estimated: 9 mL/min — ABNORMAL LOW (ref >60)
Glucose, Bld: 87 mg/dL (ref 70–99)
Potassium: 4.8 mmol/L (ref 3.5–5.1)
Sodium: 133 mmol/L — ABNORMAL LOW (ref 135–145)

## 2022-09-17 LAB — COMPREHENSIVE METABOLIC PANEL
ALT: 10 U/L (ref 0–44)
AST: 17 U/L (ref 15–41)
Albumin: 3.1 g/dL — ABNORMAL LOW (ref 3.5–5.0)
Alkaline Phosphatase: 85 U/L (ref 38–126)
Anion gap: 14 (ref 5–15)
BUN: 28 mg/dL — ABNORMAL HIGH (ref 8–23)
CO2: 21 mmol/L — ABNORMAL LOW (ref 22–32)
Calcium: 9.3 mg/dL (ref 8.9–10.3)
Chloride: 99 mmol/L (ref 98–111)
Creatinine, Ser: 4.45 mg/dL — ABNORMAL HIGH (ref 0.44–1.00)
GFR, Estimated: 10 mL/min — ABNORMAL LOW (ref >60)
Glucose, Bld: 79 mg/dL (ref 70–99)
Potassium: 4.8 mmol/L (ref 3.5–5.1)
Sodium: 134 mmol/L — ABNORMAL LOW (ref 135–145)
Total Bilirubin: 0.7 mg/dL (ref 0.3–1.2)
Total Protein: 7.4 g/dL (ref 6.5–8.1)

## 2022-09-17 LAB — IRON AND TIBC
Iron: 39 ug/dL (ref 28–170)
Saturation Ratios: 16 % (ref 10.4–31.8)
TIBC: 245 ug/dL — ABNORMAL LOW (ref 250–450)
UIBC: 206 ug/dL

## 2022-09-17 LAB — FOLATE: Folate: 4.5 ng/mL — ABNORMAL LOW (ref >5.9)

## 2022-09-17 LAB — RETICULOCYTES
Immature Retic Fract: 9.3 % (ref 2.3–15.9)
RBC.: 3.58 MIL/uL — ABNORMAL LOW (ref 3.87–5.11)
Retic Count, Absolute: 53.7 10*3/uL (ref 19.0–186.0)
Retic Ct Pct: 1.5 % (ref 0.4–3.1)

## 2022-09-17 LAB — FERRITIN: Ferritin: 107 ng/mL (ref 11–307)

## 2022-09-17 LAB — CREATININE, URINE, RANDOM: Creatinine, Urine: 64 mg/dL

## 2022-09-17 LAB — PROTEIN / CREATININE RATIO, URINE
Creatinine, Urine: 63 mg/dL
Protein Creatinine Ratio: 4.17 mg/mg{Cre} — ABNORMAL HIGH (ref 0.00–0.15)
Total Protein, Urine: 263 mg/dL

## 2022-09-17 LAB — SODIUM, URINE, RANDOM: Sodium, Ur: 91 mmol/L

## 2022-09-17 LAB — CK: Total CK: 129 U/L (ref 38–234)

## 2022-09-17 LAB — HEMOGLOBIN AND HEMATOCRIT, BLOOD
HCT: 27.7 % — ABNORMAL LOW (ref 36.0–46.0)
Hemoglobin: 8.5 g/dL — ABNORMAL LOW (ref 12.0–15.0)

## 2022-09-17 LAB — MAGNESIUM
Magnesium: 1.6 mg/dL — ABNORMAL LOW (ref 1.7–2.4)
Magnesium: 2.6 mg/dL — ABNORMAL HIGH (ref 1.7–2.4)

## 2022-09-17 LAB — APTT: aPTT: 29 seconds (ref 24–36)

## 2022-09-17 LAB — VITAMIN B12: Vitamin B-12: 233 pg/mL (ref 180–914)

## 2022-09-17 IMAGING — NM NM PULMONARY PERF PARTICULATE
8 series · 8 of 8 positions shown · non-contrast
Comparison: None.

CLINICAL DATA: Lower extremity edema and pain.

EXAM:
NUCLEAR MEDICINE PERFUSION LUNG SCAN
TECHNIQUE: Perfusion images were obtained in multiple projections after
intravenous injection of radiopharmaceutical.
Ventilation scans intentionally deferred if perfusion scan and chest
x-ray adequate for interpretation during COVID 19 epidemic.
RADIOPHARMACEUTICALS:  3.8 mCi Nc-IIm MAA IV

[Series 1: ant/post perf · 4.14mm/px · 1 of 1 slices shown (1 of 2)]
[im 1/1]
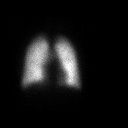

[Series 1: ant/post perf · 4.14mm/px · 1 of 1 slices shown (2 of 2)]
[im 1/1]
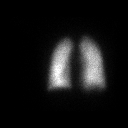

[Series 2: lao/rpo perf · 4.14mm/px · 1 of 1 slices shown (1 of 2)]
[im 1/1]
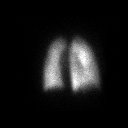

[Series 2: lao/rpo perf · 4.14mm/px · 1 of 1 slices shown (2 of 2)]
[im 1/1]
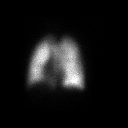

[Series 3: lpo/rao perf · 4.14mm/px · 1 of 1 slices shown (1 of 2)]
[im 1/1]
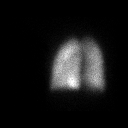

[Series 3: lpo/rao perf · 4.14mm/px · 1 of 1 slices shown (2 of 2)]
[im 1/1]
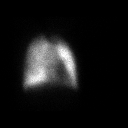

[Series 4: lt lat/rt lat perf · 4.14mm/px · 1 of 1 slices shown (1 of 2)]
[im 1/1]
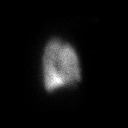

[Series 4: lt lat/rt lat perf · 4.14mm/px · 1 of 1 slices shown (2 of 2)]
[im 1/1]
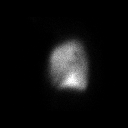

[8 of 8 positions shown; findings below may reference images not displayed]

FINDINGS: Normal, homogeneous distribution of tracer activity is seen
throughout both lungs.

No segmental or subsegmental perfusion defects are identified.
IMPRESSION: Normal nuclear medicine perfusion lung scan.

## 2022-09-17 MED ORDER — DULOXETINE HCL 30 MG PO CPEP
90.0000 mg | ORAL_CAPSULE | Freq: Every day | ORAL | Status: DC
  Administered 2022-09-17: 90 mg via ORAL
  Filled 2022-09-17: qty 3

## 2022-09-17 MED ORDER — MELATONIN 3 MG PO TABS: 3.0000 mg | ORAL_TABLET | Freq: Every evening | ORAL | Status: DC | PRN

## 2022-09-17 MED ORDER — ENSURE ENLIVE PO LIQD
237.0000 mL | Freq: Two times a day (BID) | ORAL | Status: DC
  Administered 2022-09-17: 237 mL via ORAL

## 2022-09-17 MED ORDER — AMLODIPINE BESYLATE 5 MG PO TABS
10.0000 mg | ORAL_TABLET | Freq: Once | ORAL | Status: AC
  Administered 2022-09-17: 10 mg via ORAL
  Filled 2022-09-17: qty 2

## 2022-09-17 MED ORDER — CLONIDINE HCL 0.2 MG/24HR TD PTWK
0.2000 mg | MEDICATED_PATCH | TRANSDERMAL | Status: DC
  Administered 2022-09-17: 0.2 mg via TRANSDERMAL
  Filled 2022-09-17: qty 1

## 2022-09-17 MED ORDER — BUPROPION HCL ER (XL) 150 MG PO TB24: 150.0000 mg | ORAL_TABLET | Freq: Every morning | ORAL | Status: DC

## 2022-09-17 MED ORDER — AMLODIPINE BESYLATE 10 MG PO TABS: 10.0000 mg | ORAL_TABLET | Freq: Every day | ORAL | Status: DC

## 2022-09-17 MED ORDER — AMLODIPINE BESYLATE 5 MG PO TABS
5.0000 mg | ORAL_TABLET | ORAL | Status: DC
  Administered 2022-09-17: 5 mg via ORAL
  Filled 2022-09-17: qty 1

## 2022-09-17 MED ORDER — AMLODIPINE BESYLATE 10 MG PO TABS: 10.0000 mg | ORAL_TABLET | Freq: Every day | ORAL | 1 refills | Status: DC

## 2022-09-17 MED ORDER — ONDANSETRON HCL 4 MG/2ML IJ SOLN: 4.0000 mg | Freq: Four times a day (QID) | INTRAMUSCULAR | Status: DC | PRN

## 2022-09-17 MED ORDER — CARVEDILOL 12.5 MG PO TABS
12.5000 mg | ORAL_TABLET | Freq: Two times a day (BID) | ORAL | Status: DC
  Administered 2022-09-17: 12.5 mg via ORAL
  Filled 2022-09-17: qty 1

## 2022-09-17 MED ORDER — HYDRALAZINE HCL 20 MG/ML IJ SOLN
10.0000 mg | INTRAMUSCULAR | Status: DC | PRN
  Filled 2022-09-17: qty 1

## 2022-09-17 MED ORDER — ACETAMINOPHEN 650 MG RE SUPP: 650.0000 mg | Freq: Four times a day (QID) | RECTAL | Status: DC | PRN

## 2022-09-17 MED ORDER — LACTATED RINGERS IV SOLN: INTRAVENOUS | Status: DC

## 2022-09-17 MED ORDER — LEVETIRACETAM 500 MG PO TABS
500.0000 mg | ORAL_TABLET | Freq: Two times a day (BID) | ORAL | Status: DC
  Administered 2022-09-17: 500 mg via ORAL
  Filled 2022-09-17: qty 1

## 2022-09-17 MED ORDER — LORAZEPAM 0.5 MG PO TABS: 0.5000 mg | ORAL_TABLET | Freq: Every day | ORAL | Status: DC | PRN

## 2022-09-17 MED ORDER — ACETAMINOPHEN 325 MG PO TABS
650.0000 mg | ORAL_TABLET | Freq: Four times a day (QID) | ORAL | Status: DC | PRN
  Administered 2022-09-17: 650 mg via ORAL
  Filled 2022-09-17: qty 2

## 2022-09-17 MED ORDER — OXYCODONE HCL 5 MG PO TABS
20.0000 mg | ORAL_TABLET | Freq: Four times a day (QID) | ORAL | Status: DC | PRN
  Administered 2022-09-17: 20 mg via ORAL
  Filled 2022-09-17: qty 4

## 2022-09-17 MED ORDER — MAGNESIUM SULFATE 2 GM/50ML IV SOLN
2.0000 g | Freq: Once | INTRAVENOUS | Status: AC
  Administered 2022-09-17: 2 g via INTRAVENOUS
  Filled 2022-09-17: qty 50

## 2022-09-17 NOTE — Evaluation (Signed)
Occupational Therapy Evaluation Patient Details Name: Judy Chang MRN: [id number] DOB: 02-Sep-1950 Today's Date: 09/17/2022   History of Present Illness Pt is 72 yo female who presents on 09/16/22 after falling in her driveway and sustaining acute SAH.  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 She lives with her husband and is mod I at baseline. Upon evaluation the pt was limited by generalized weakness and decreased activity tolerance . Overall she mobilized with CGA and HHA but would benefit from AD to decrease fall risk. Due to the deficits listed below the pt also needs up to CGA for LB ADLs for safety only. Family present and reports her cognition is at baseline. Pt will benefit from continued acute OT services and assist/supervision from family as needed        If plan is discharge home, recommend the following: A little help with walking and/or transfers;A little help with bathing/dressing/bathroom;Assistance with cooking/housework;Direct supervision/assist for financial management;Direct supervision/assist for medications management;Assist for transportation;Help with stairs or ramp for entrance    Functional Status Assessment  Patient has had a recent decline in their functional status and demonstrates the ability to make significant improvements in function in a reasonable and predictable amount of time.  Equipment Recommendations  None recommended by OT       Precautions / Restrictions Precautions Precautions: Fall Restrictions Weight Bearing Restrictions: No      Mobility Bed Mobility Overal bed mobility: Needs Assistance Bed Mobility: Supine to Sit     Supine to sit: Supervision          Transfers Overall transfer level: Needs assistance Equipment used: 1 person hand held assist Transfers: Sit to/from Stand Sit to Stand: Contact  guard assist                  Balance Overall balance assessment: Needs assistance Sitting-balance support: Feet supported, No upper extremity supported Sitting balance-Leahy Scale: Fair     Standing balance support: Single extremity supported, During functional activity Standing balance-Leahy Scale: Fair                             ADL either performed or assessed with clinical judgement   ADL Overall ADL's : Needs assistance/impaired Eating/Feeding: Independent;Sitting   Grooming: Supervision/safety;Standing   Upper Body Bathing: Set up;Sitting   Lower Body Bathing: Sit to/from stand;Contact guard assist   Upper Body Dressing : Set up;Sitting   Lower Body Dressing: Contact guard assist;Sit to/from stand   Toilet Transfer: Contact guard assist;Ambulation   Toileting- Clothing Manipulation and Hygiene: Supervision/safety;Sitting/lateral lean       Functional mobility during ADLs: Contact guard assist General ADL Comments: HHA used due to no AD in the room. pt uses SPC at baseline. Pt likely near her functional baseline     Vision Baseline Vision/History: 0 No visual deficits Vision Assessment?: No apparent visual deficits     Perception Perception: Not tested       Praxis         Pertinent Vitals/Pain Pain Assessment Pain Assessment: Faces Faces Pain Scale: No hurt Pain Intervention(s): Monitored during session     Extremity/Trunk Assessment Upper Extremity Assessment Upper Extremity Assessment: Generalized weakness   Lower Extremity Assessment Lower Extremity Assessment: Generalized weakness   Cervical / Trunk Assessment Cervical / Trunk Assessment: Kyphotic   Communication Communication Communication: No apparent  difficulties Cueing Techniques: Verbal cues   Cognition Arousal: Alert Behavior During Therapy: WFL for tasks assessed/performed Overall Cognitive Status: Within Functional Limits for tasks assessed                                  General Comments: baseline cognition. pt reports a significant improvement in pain. family present and confirms her current cognition seems to be baseline. Pt follows all simple commands adn is pleasant     General Comments  VSS on RA, family present    Exercises     Shoulder Instructions      Home Living Family/patient expects to be discharged to:: Private residence Living Arrangements: Spouse/significant other Available Help at Discharge: Family;Available 24 hours/day Type of Home: Apartment Home Access: Stairs to enter Entrance Stairs-Number of Steps: 14 Entrance Stairs-Rails: Right Home Layout: One level     Bathroom Shower/Tub: Producer, television/film/video: Standard     Home Equipment: Medical laboratory scientific officer - single point   Additional Comments: lives with husband      Prior Functioning/Environment Prior Level of Function : Needs assist             Mobility Comments: ambulates with SPC ADLs Comments: mod I for BADLs, family assists with IADLs, pt states she drives short distances        OT Problem List: Decreased strength;Decreased range of motion;Decreased activity tolerance;Impaired balance (sitting and/or standing);Decreased safety awareness;Decreased knowledge of use of DME or AE;Decreased knowledge of precautions      OT Treatment/Interventions: Therapeutic exercise;Self-care/ADL training;DME and/or AE instruction;Therapeutic activities;Patient/family education;Balance training    OT Goals(Current goals can be found in the care plan section) Acute Rehab OT Goals Patient Stated Goal: home today OT Goal Formulation: With patient Time For Goal Achievement: 10/01/22 Potential to Achieve Goals: Good  OT Frequency: Min 1X/week       AM-PAC OT "6 Clicks" Daily Activity     Outcome Measure Help from another person eating meals?: None Help from another person taking care of personal grooming?: A Little Help from another person  toileting, which includes using toliet, bedpan, or urinal?: A Little Help from another person bathing (including washing, rinsing, drying)?: A Little Help from another person to put on and taking off regular upper body clothing?: None Help from another person to put on and taking off regular lower body clothing?: A Little 6 Click Score: 20   End of Session Equipment Utilized During Treatment: Gait belt Nurse Communication: Mobility status  Activity Tolerance: Patient tolerated treatment well Patient left: in bed;with call bell/phone within reach;with family/visitor present  OT Visit Diagnosis: Unsteadiness on feet (R26.81);Other abnormalities of gait and mobility (R26.89);Muscle weakness (generalized) (M62.81);History of falling (Z91.81)                Time: 1308-6578 OT Time Calculation (min): 17 min Charges:  OT General Charges $OT Visit: 1 Visit OT Evaluation $OT Eval Moderate Complexity: 1 Mod  Derenda Mis, OTR/L Acute Rehabilitation Services Office [phone] Secure Chat Communication Preferred   Donia Pounds 09/17/2022, 1:02 PM

## 2022-09-17 NOTE — Discharge Summary (Signed)
Physician Discharge Summary  Judy Chang WUX:[id number] DOB: May 16, 1950 DOA: 09/16/2022  PCP: Noberto Retort, MD  Admit date: 09/16/2022 Discharge date: 09/17/2022  Admitted From: home Discharge disposition: home   Recommendations for Outpatient Follow-Up:   Close renal follow up BMP  BP check    Discharge Diagnosis:   Principal Problem:   SAH (subarachnoid hemorrhage) (HCC) Active Problems:   Essential hypertension   Acute renal failure superimposed on stage 4 chronic kidney disease (HCC)   Fall at home, initial encounter   Hypomagnesemia   Acute anemia   GAD (generalized anxiety disorder)   History of seizures    Discharge Condition: Improved.  Diet recommendation: Low sodium, heart healthy  Wound care: None.  Code status: Full.   History of Present Illness:   Judy Chang is a 72 y.o. female with medical history significant for essential hypertension, seizures, CKD 4 associated baseline creatinine 1.8-2.5, dementia, GAD, who is admitted to [facility] on 09/16/2022 with acute subarachnoid hemorrhage after presenting from home to [facility] ED complaining of fall.    The patient conveys a ground-level fall in her driveway earlier today in which she struck the left portion of her face on the associated asphalt.  She believes she tripped resulting in this will, she is unsure as to the specific mechanism, but denies any associated loss of consciousness.  Was unwitnessed.  She reports mild headache on the left portion of her head, but otherwise denies any acute arthralgias or myalgias.  No neck pain, and or any acute hip discomfort.  She is not on any blood thinners as an outpatient, including no aspirin.   Denies any chest pain, shortness of breath, palpitations, diaphoresis, dizziness.  Denies any acute focal weakness or any acute focal numbness, paresthesias.  No acute change in vision, dysarthria, facial droop, vertigo.   No recent subjective  fever, chills or rigors, generalized myalgias.  No recent dysuria or gross hematuria.   Hospital Course by Problem:   Acute subarachnoid hemorrhage: In the setting of presenting ground-level fall -absence of any blood thinners as an outpatient, -CT head showed trace subarachnoid hemorrhage in the occipital horn of the left lateral ventricle without evidence of midline shift or mass effect.  No evidence of associated acute focal neurologic deficits.  -EDP discussed patient's case with on-call neurosurgery, Dr. Yetta Barre, who conveyed no indication for neurosurgical intervention, and did not recommend repeat imaging of the head at this time but did recommend controlling BP     Ground-level fall -PT/OT      Acute Kidney Injury superimposed on stage IV CKD: In the setting of a history of stage IV CKD with baseline creatinine range noted 4.7 -follows with Dr. Signe Colt-- was due to start HD outpatient-- currently do not see any reason to start here  -urinalysis  showed small hemoglobin in the absence of any RBCs- CK normal   -avoid nephrotoxic agents.      Hypomagnesemia:  -replete    Acute normocytic anemia: Presenting hemoglobin found to be 9.1 relative to most recent prior hemoglobin value of 12.5 and August 2023.  T -suspicion that the patient's decline in hemoglobin may be consistent with a gradual decline in the context of gradually worsening renal function over the course the last year.  Aside from the patient's trivial subarachnoid hemorrhage, no overt evidence of acute blood loss at this time.        Generalized anxiety disorder: documented h/o such. On Wellbutrin,  Cymbalta, and scheduled Ativan as outpatient.  In the setting of her presenting acute subarachnoid hemorrhage and increased risk for seizure, will hold outpatient Wellbutrin for now given associated decline in seizure threshold with this medication.      Essential Hypertension:  -outpatient antihypertensive regimen including  Norvasc, Coregand patch -reodered      History of seizures: Documented history of such, without clinical evidence to suggest active seizures at this time.  However, she is at a relative increased risk for seizures in the context of consequently, we will hold outpatient Wellbutrin for now, which decreases seizure threshold.  Her presenting acute subarachnoid hemorrhage.  outpatient antiepileptic regimen consists of: Keppra.     Medical Consultants:    NS  Discharge Exam:   Vitals:   09/17/22 1145 09/17/22 1249  BP: (!) 160/67 132/66  Pulse: 64   Resp: 12   Temp: 97.9 F (36.6 C)   SpO2: 94%    Vitals:   09/17/22 0300 09/17/22 0746 09/17/22 1145 09/17/22 1249  BP: (!) 146/82 (!) 157/72 (!) 160/67 132/66  Pulse: 69 77 64   Resp: 15 18 12    Temp: 98.6 F (37 C) 98.7 F (37.1 C) 97.9 F (36.6 C)   TempSrc: Oral Oral Oral   SpO2: 93% 93% 94%   Weight:      Height:        General exam: Appears calm and comfortable.    The results of significant diagnostics from this hospitalization (including imaging, microbiology, ancillary and laboratory) are listed below for reference.     Procedures and Diagnostic Studies:   US RENAL  Result Date: 09/17/2022 CLINICAL DATA:  Acute renal failure. EXAM: RENAL / URINARY TRACT ULTRASOUND COMPLETE COMPARISON:  Renal ultrasound 09/01/2021 FINDINGS: Right Kidney: Renal measurements: 11.0 x 5.1 x 6.8 cm = volume: 200 mL. A simple cyst measures 7.5 x 5.2 x 6.6 cm. The parenchyma is diffusely echogenic. No other lesions are present. No obstruction is present. Left Kidney: Renal measurements: 7.3 x 4.8 x 4.0 cm = volume: 77 mL. The kidney is diffusely echogenic. A simple cyst measures 10 x 8 x 10 mm. No other focal lesions are present. No obstruction is present. Bladder: Appears normal for degree of bladder distention. Other: None. IMPRESSION: 1. Echogenic renal parenchyma bilaterally is consistent with medical renal disease. 2. Bilateral simple  renal cysts. No follow-up imaging is recommended. 3. No obstruction or mass lesion. Electronically Signed   By: Marin Roberts M.D.   On: 09/17/2022 12:39   DG Chest 2 View  Result Date: 09/16/2022 CLINICAL DATA:  Fall. EXAM: CHEST - 2 VIEW COMPARISON:  05/08/2021 FINDINGS: Rotated exam. Normal heart size and mediastinal contours allowing for rotation. No pneumothorax, pleural effusion or focal airspace disease. On limited assessment, no acute osseous findings. IMPRESSION: Rotated exam without acute finding. Electronically Signed   By: Narda Rutherford M.D.   On: 09/16/2022 22:58   CT Cervical Spine Wo Contrast  Result Date: 09/16/2022 CLINICAL DATA:  Neck trauma (Age >= 65y) Fall with laceration above left eyebrow. EXAM: CT CERVICAL SPINE WITHOUT CONTRAST TECHNIQUE: Multidetector CT imaging of the cervical spine was performed without intravenous contrast. Multiplanar CT image reconstructions were also generated. RADIATION DOSE REDUCTION: This exam was performed according to the departmental dose-optimization program which includes automated exposure control, adjustment of the mA and/or kV according to patient size and/or use of iterative reconstruction technique. COMPARISON:  04/19/2022 FINDINGS: Alignment: Slight levo scoliotic curvature. No traumatic subluxation. Skull base and vertebrae:  No acute fracture. Vertebral body heights are maintained. Schmorl's node within the inferior endplate of C3, chronic. The dens and skull base are intact. Soft tissues and spinal canal: No prevertebral fluid or swelling. No visible canal hematoma. Disc levels:  Degenerative disc disease is most prominent at C3-C4. Upper chest: Heterogeneously enlarged thyroid gland. This has been evaluated on previous imaging. (ref: J Am Coll Radiol. 2015 Feb;12(2): 143-50).Thyroid ultrasound 05/08/2021 Other: Carotid calcifications. IMPRESSION: Degenerative change in the cervical spine without acute fracture or subluxation.  Electronically Signed   By: Narda Rutherford M.D.   On: 09/16/2022 22:38   CT Head Wo Contrast  Result Date: 09/16/2022 CLINICAL DATA:  Headache, post traumatic 72 year old post fall.  Laceration above left eye. EXAM: CT HEAD WITHOUT CONTRAST TECHNIQUE: Contiguous axial images were obtained from the base of the skull through the vertex without intravenous contrast. RADIATION DOSE REDUCTION: This exam was performed according to the departmental dose-optimization program which includes automated exposure control, adjustment of the mA and/or kV according to patient size and/or use of iterative reconstruction technique. COMPARISON:  Head CT 04/19/2022 FINDINGS: Brain: Trace subarachnoid hemorrhage dependently in the occipital horn of the left lateral ventricle, series 2, image 13. No other evidence of hemorrhage. No subdural collection. Generalized atrophy and chronic small vessel ischemic change. No acute ischemia. No hydrocephalus or midline shift. Vascular: Atherosclerosis of skullbase vasculature without hyperdense vessel or abnormal calcification. Skull: No fracture or focal lesion. Sinuses/Orbits: No acute fracture. No mastoid effusion. Bilateral cataract resection. Other: No large scalp hematoma. IMPRESSION: 1. Trace subarachnoid hemorrhage dependently in the occipital horn of the left lateral ventricle. 2. Generalized atrophy and chronic small vessel ischemic change. Critical Value/emergent results were called by telephone at the time of interpretation on 09/16/2022 at 10:34 pm to provider [facility] , who verbally acknowledged these results. Electronically Signed   By: Narda Rutherford M.D.   On: 09/16/2022 22:34   DG Pelvis 1-2 Views  Result Date: 09/16/2022 CLINICAL DATA:  Recent fall EXAM: PELVIS - 1-VIEW COMPARISON:  None Available. FINDINGS: Pelvic ring is intact. Left hip replacement is noted. No soft tissue changes are seen. No acute fracture is noted. IMPRESSION: No acute abnormality noted.  Electronically Signed   By: Alcide Clever M.D.   On: 09/16/2022 22:33     Labs:   Basic Metabolic Panel: Recent Labs  Lab 09/17/22 0012 09/17/22 0628  NA 133* 134*  K 4.8 4.8  CL 102 99  CO2 23 21*  GLUCOSE 87 79  BUN 27* 28*  CREATININE 4.72* 4.45*  CALCIUM 9.3 9.3  MG 1.6* 2.6*   GFR Estimated Creatinine Clearance: 8.1 mL/min (A) (by C-G formula based on SCr of 4.45 mg/dL (H)). Liver Function Tests: Recent Labs  Lab 09/17/22 0628  AST 17  ALT 10  ALKPHOS 85  BILITOT 0.7  PROT 7.4  ALBUMIN 3.1*   No results for input(s): "LIPASE", "AMYLASE" in the last 168 hours. No results for input(s): "AMMONIA" in the last 168 hours. Coagulation profile Recent Labs  Lab 09/17/22 0628  INR 1.0    CBC: Recent Labs  Lab 09/17/22 0012 09/17/22 0628 09/17/22 1248  WBC 8.6 8.0  --   NEUTROABS 6.5 5.6  --   HGB 9.1* 8.6* 8.5*  HCT 30.0* 27.2* 27.7*  MCV 83.8 85.0  --   PLT 307 343  --    Cardiac Enzymes: Recent Labs  Lab 09/17/22 0012  CKTOTAL 129   BNP: Invalid input(s): "POCBNP" CBG: No results  for input(s): "GLUCAP" in the last 168 hours. D-Dimer No results for input(s): "DDIMER" in the last 72 hours. Hgb A1c No results for input(s): "HGBA1C" in the last 72 hours. Lipid Profile No results for input(s): "CHOL", "HDL", "LDLCALC", "TRIG", "CHOLHDL", "LDLDIRECT" in the last 72 hours. Thyroid function studies No results for input(s): "TSH", "T4TOTAL", "T3FREE", "THYROIDAB" in the last 72 hours.  Invalid input(s): "FREET3" Anemia work up Recent Labs    09/17/22 0012 09/17/22 0628  VITAMINB12  --  233  FOLATE 4.5*  --   FERRITIN 107  --   TIBC 245*  --   IRON 39  --   RETICCTPCT 1.5  --    Microbiology No results found for this or any previous visit (from the past 240 hour(s)).   Discharge Instructions:   Discharge Instructions     Diet - low sodium heart healthy   Complete by: As directed    Discharge instructions   Complete by: As directed     Monitor BP and bring to PCP/nephrology appointment Home health   Increase activity slowly   Complete by: As directed       Allergies as of 09/17/2022       Reactions   Citalopram Hydrobromide    Unknown   Pregabalin    Unknown   Nsaids Hives, Itching   Burning of the tongue         Medication List     TAKE these medications    amLODipine 10 MG tablet Commonly known as: NORVASC Take 1 tablet (10 mg total) by mouth daily. Start taking on: September 18, 2022 What changed:  medication strength how much to take Another medication with the same name was removed. Continue taking this medication, and follow the directions you see here.   buPROPion 150 MG 24 hr tablet Commonly known as: WELLBUTRIN XL Take 1 tablet (150 mg total) by mouth every morning. Start taking on: September 28, 2022 What changed: These instructions start on September 28, 2022. If you are unsure what to do until then, ask your doctor or other care provider.   carvedilol 12.5 MG tablet Commonly known as: COREG Take 1 tablet (12.5 mg total) by mouth 2 (two) times daily with a meal.   cloNIDine 0.2 mg/24hr patch Commonly known as: CATAPRES - Dosed in mg/24 hr Place 1 patch (0.2 mg total) onto the skin once a week.   DULoxetine 30 MG capsule Commonly known as: CYMBALTA Take 90 mg by mouth daily.   levETIRAcetam 500 MG tablet Commonly known as: KEPPRA Take 1 tablet (500 mg total) by mouth every 12 (twelve) hours.   LORazepam 0.5 MG tablet Commonly known as: ATIVAN Take 0.5 mg by mouth daily.   Oxycodone HCl 20 MG Tabs Take 20 mg by mouth 4 (four) times daily as needed (pain).   traZODone 100 MG tablet Commonly known as: DESYREL Take 100 mg by mouth at bedtime.               Durable Medical Equipment  (From admission, onward)           Start     Ordered   09/17/22 1356  For home use only DME Walker  Once       Question:  Patient needs a walker to treat with the following condition   Answer:  Weakness   09/17/22 1356            Follow-up Information     Noberto Retort, MD  Follow up in 1 week(s).   Specialty: Family Medicine Contact information: [phone] [address] [place] [address] [phone]                  Time coordinating discharge: 35 min  Signed:  Joseph Art DO  [facility] 09/17/2022, 1:58 PM

## 2022-09-17 NOTE — Progress Notes (Signed)
RNCM received HHPT/OT orders for patient.  Patient with no preference for home health services.  Cory at [facility] contacted with order and confirmation of services received.  Patient in agreement.  Patient's insurance will not pay for RW as patient received one on 2020 and cannot receive another until 2025-patient aware.

## 2022-09-17 NOTE — Progress Notes (Addendum)
Patient admitted after midnight, please see H&P.  Here with SAH after a fall    Acute subarachnoid hemorrhage: In the setting of presenting ground-level fall -absence of any blood thinners as an outpatient, -CT head showed trace subarachnoid hemorrhage in the occipital horn of the left lateral ventricle without evidence of midline shift or mass effect.  No evidence of associated acute focal neurologic deficits.  -EDP discussed patient's case with on-call neurosurgery, Dr. Yetta Barre, who conveyed no indication for neurosurgical intervention, and did not recommend repeat imaging of the head at this time but did recommend controlling BP     Ground-level fall -PT/OT      Acute Kidney Injury superimposed on stage IV CKD: In the setting of a history of stage IV CKD with baseline creatinine range noted 4.7 -follows with Dr. Signe Colt-- was due to start HD outpatient-- currently do not see any reason to start here  -urinalysis  showed small hemoglobin in the absence of any RBCs- CK normal   -avoid nephrotoxic agents.      Hypomagnesemia:  -replete    Acute normocytic anemia: Presenting hemoglobin found to be 9.1 relative to most recent prior hemoglobin value of 12.5 and August 2023.  T -suspicion that the patient's decline in hemoglobin may be consistent with a gradual decline in the context of gradually worsening renal function over the course the last year.  Aside from the patient's trivial subarachnoid hemorrhage, no overt evidence of acute blood loss at this time.        Generalized anxiety disorder: documented h/o such. On Wellbutrin, Cymbalta, and scheduled Ativan as outpatient.  In the setting of her presenting acute subarachnoid hemorrhage and increased risk for seizure, will hold outpatient Wellbutrin for now given associated decline in seizure threshold with this medication.    Essential Hypertension:  -outpatient antihypertensive regimen including Norvasc, Coregand patch -reodered       History of seizures: Documented history of such, without clinical evidence to suggest active seizures at this time.  However, she is at a relative increased risk for seizures in the context of consequently, we will hold outpatient Wellbutrin for now, which decreases seizure threshold.  Her presenting acute subarachnoid hemorrhage.  outpatient antiepileptic regimen consists of: Keppra.    Marlin Canary DO

## 2022-09-17 NOTE — Plan of Care (Signed)
?  Problem: Clinical Measurements: ?Goal: Will remain free from infection ?Outcome: Progressing ?  ?

## 2022-09-17 NOTE — ED Notes (Signed)
ED TO INPATIENT HANDOFF REPORT  ED Nurse Name and Phone #: Murlean Iba PM [phone]  S Name/Age/Gender Judy Chang 72 y.o. female Room/Bed: 034C/034C  Code Status   Code Status: Full Code  Home/SNF/Other Home Patient oriented to: self and place Is this baseline? Yes   Triage Complete: Triage complete  Chief Complaint SAH (subarachnoid hemorrhage) (HCC) [I60.9]  Triage Note Patient BIB [facility] from home for mechanical fall after getting tripped up in the drive way. Patient is A&Ox1 at baseline, no thinners but doesn't remember the fall. Patient also has history of hypertension, BP was 184/78, HR 80, 97% on RA   Allergies Allergies  Allergen Reactions   Citalopram Hydrobromide     Unknown   Pregabalin     Unknown   Nsaids Hives and Itching    Burning of the tongue     Level of Care/Admitting Diagnosis ED Disposition     ED Disposition  Admit   Condition  --   Comment  Hospital Area: [facility] [[id number]]  Level of Care: Progressive [102]  Admit to Progressive based on following criteria: MULTISYSTEM THREATS such as stable sepsis, metabolic/electrolyte imbalance with or without encephalopathy that is responding to early treatment.  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   Benign essential HTN 10/23/2015   Chondromalacia 02/25/2018   Noted on MRI: Mild   DDD (degenerative disc disease), lumbar 01/10/2011   Noted on MRI    Depression    DJD (degenerative joint disease)    GERD (gastroesophageal reflux disease)    History of cataract    Bilateral   Hypercholesterolemia    Hypertension    Labial tear 02/25/2018   Noted on MRI: smal Left anterior    Lumbar spondylosis 01/10/2011   Noted on MRI   Migraines    Renal cyst 11/17/2015   Notedon [place] Abd: Simple, Right   Spinal stenosis 09/06/2016   Noted on MRI: Moderate-Severe   Vitreous floater, bilateral    Past Surgical History:  Procedure Laterality Date   COLONOSCOPY     PARTIAL HYSTERECTOMY     ROTATOR CUFF REPAIR Left    TONSILLECTOMY     TOTAL HIP ARTHROPLASTY Left 06/22/2018   Procedure: TOTAL HIP ARTHROPLASTY ANTERIOR APPROACH;  Surgeon: Jodi Geralds, MD;  Location: [facility];  Service: Orthopedics;  Laterality: Left;     A IV Location/Drains/Wounds Patient Lines/Drains/Airways Status     Active Line/Drains/Airways     Name Placement date Placement time Site Days   Peripheral IV 09/17/22 22 G 1.75" Anterior;Left Forearm 09/17/22  0012  Forearm  less than 1            Intake/Output Last 24 hours No intake or output data in the 24 hours ending 09/17/22 0018  Labs/Imaging Results for orders placed or performed during the hospital encounter of 09/16/22 (from the past 48 hour(s))  Urinalysis, Routine w reflex microscopic -Urine, Clean  Catch     Status: Abnormal   Collection Time: 09/16/22 11:15 PM  Result Value Ref Range   Color, Urine YELLOW YELLOW   APPearance CLEAR CLEAR   Specific Gravity, Urine 1.010 1.005 - 1.030   pH 5.0 5.0 - 8.0   Glucose, UA NEGATIVE NEGATIVE mg/dL   Hgb urine dipstick SMALL (A) NEGATIVE   Bilirubin Urine NEGATIVE NEGATIVE   Ketones, ur NEGATIVE NEGATIVE mg/dL   Protein, ur >=161 (A) NEGATIVE mg/dL   Nitrite NEGATIVE NEGATIVE   Leukocytes,Ua NEGATIVE NEGATIVE   RBC / HPF 0-5 0 - 5 RBC/hpf   WBC, UA 0-5 0 - 5 WBC/hpf   Bacteria, UA NONE SEEN NONE SEEN   Squamous Epithelial / HPF 0-5 0 - 5 /HPF     Comment: Performed at [facility], [address], [place], [place] [address]   DG Chest 2 View  Result Date: 09/16/2022 CLINICAL DATA:  Fall. EXAM: CHEST - 2 VIEW COMPARISON:  05/08/2021 FINDINGS: Rotated exam. Normal heart size and mediastinal contours allowing for rotation. No pneumothorax, pleural effusion or focal airspace disease. On limited assessment, no acute osseous findings. IMPRESSION: Rotated exam without acute finding. Electronically Signed   By: Narda Rutherford M.D.   On: 09/16/2022 22:58   CT Cervical Spine Wo Contrast  Result Date: 09/16/2022 CLINICAL DATA:  Neck trauma (Age >= 65y) Fall with laceration above left eyebrow. EXAM: CT CERVICAL SPINE WITHOUT CONTRAST TECHNIQUE: Multidetector CT imaging of the cervical spine was performed without intravenous contrast. Multiplanar CT image reconstructions were also generated. RADIATION DOSE REDUCTION: This exam was performed according to the departmental dose-optimization program which includes automated exposure control, adjustment of the mA and/or kV according to patient size and/or use of iterative reconstruction technique. COMPARISON:  04/19/2022 FINDINGS: Alignment: Slight levo scoliotic curvature. No traumatic subluxation. Skull base and vertebrae: No acute fracture. Vertebral body heights are maintained. Schmorl's node within the inferior endplate of C3, chronic. The dens and skull base are intact. Soft tissues and spinal canal: No prevertebral fluid or swelling. No visible canal hematoma. Disc levels:  Degenerative disc disease is most prominent at C3-C4. Upper chest: Heterogeneously enlarged thyroid gland. This has been evaluated on previous imaging. (ref: J Am Coll Radiol. 2015 Feb;12(2): 143-50).Thyroid ultrasound 05/08/2021 Other: Carotid calcifications. IMPRESSION: Degenerative change in the cervical spine without acute fracture or subluxation. Electronically Signed   By: Narda Rutherford M.D.   On: 09/16/2022 22:38   CT  Head Wo Contrast  Result Date: 09/16/2022 CLINICAL DATA:  Headache, post traumatic 72 year old post fall.  Laceration above left eye. EXAM: CT HEAD WITHOUT CONTRAST TECHNIQUE: Contiguous axial images were obtained from the base of the skull through the vertex without intravenous contrast. RADIATION DOSE REDUCTION: This exam was performed according to the departmental dose-optimization program which includes automated exposure control, adjustment of the mA and/or kV according to patient size and/or use of iterative reconstruction technique. COMPARISON:  Head CT 04/19/2022 FINDINGS: Brain: Trace subarachnoid hemorrhage dependently in the occipital horn of the left lateral ventricle, series 2, image 13. No other evidence of hemorrhage. No subdural collection. Generalized atrophy and chronic small vessel ischemic change. No acute ischemia. No hydrocephalus or midline shift. Vascular: Atherosclerosis of skullbase vasculature without hyperdense vessel or abnormal calcification. Skull: No fracture or focal lesion. Sinuses/Orbits: No acute fracture. No mastoid effusion. Bilateral cataract resection. Other: No large scalp hematoma. IMPRESSION: 1. Trace subarachnoid hemorrhage dependently in the occipital horn of the left lateral ventricle. 2. Generalized atrophy and  chronic small vessel ischemic change. Critical Value/emergent results were called by telephone at the time of interpretation on 09/16/2022 at 10:34 pm to provider [facility] , who verbally acknowledged these results. Electronically Signed   By: Narda Rutherford M.D.   On: 09/16/2022 22:34   DG Pelvis 1-2 Views  Result Date: 09/16/2022 CLINICAL DATA:  Recent fall EXAM: PELVIS - 1-VIEW COMPARISON:  None Available. FINDINGS: Pelvic ring is intact. Left hip replacement is noted. No soft tissue changes are seen. No acute fracture is noted. IMPRESSION: No acute abnormality noted. Electronically Signed   By: Alcide Clever M.D.   On: 09/16/2022 22:33    Pending  Labs Unresulted Labs (From admission, onward)     Start     Ordered   09/17/22 0500  CBC with Differential/Platelet  Tomorrow morning,   R        09/17/22 0010   09/17/22 0500  Comprehensive metabolic panel  Tomorrow morning,   R        09/17/22 0010   09/17/22 0500  Magnesium  Tomorrow morning,   R        09/17/22 0010   09/17/22 0012  APTT  Add-on,   AD        09/17/22 0011   09/17/22 0012  Protime-INR  Add-on,   AD        09/17/22 0011   09/17/22 0011  Magnesium  Add-on,   AD        09/17/22 0010   09/16/22 2104  CBC with Differential  Once,   STAT        09/16/22 2103   09/16/22 2104  Basic metabolic panel  Once,   STAT        09/16/22 2103            Vitals/Pain Today's Vitals   09/16/22 2036 09/16/22 2130 09/16/22 2330 09/17/22 0000  BP: (!) 192/70 (!) 180/82 (!) 142/70 (!) 146/75  Pulse: 67 67  72  Resp: 17 12 11 12   Temp: 97.9 F (36.6 C)     SpO2: 97% 98%  97%    Isolation Precautions No active isolations  Medications Medications  acetaminophen (TYLENOL) tablet 650 mg (has no administration in time range)    Or  acetaminophen (TYLENOL) suppository 650 mg (has no administration in time range)  melatonin tablet 3 mg (has no administration in time range)  ondansetron (ZOFRAN) injection 4 mg (has no administration in time range)  hydrALAZINE (APRESOLINE) injection 10 mg (has no administration in time range)  Tdap (BOOSTRIX) injection 0.5 mL (0.5 mLs Intramuscular Given 09/16/22 2120)  hydrALAZINE (APRESOLINE) tablet 25 mg (25 mg Oral Given 09/16/22 2241)    Mobility walks     Focused Assessments Neuro Assessment Handoff:  Swallow screen pass?  Yes          Neuro Assessment:   Neuro Checks:      Has TPA been given? No If patient is a Neuro Trauma and patient is going to OR before floor call report to 4N Charge nurse: [phone] or [phone]   R Recommendations: See Admitting Provider Note  Report given to:   Additional Notes:

## 2022-09-17 NOTE — Progress Notes (Deleted)
OT Cancellation Note  Patient Details Name: MUSLIMA STROPES MRN: [id number] DOB: 28-Jan-1950   Cancelled Treatment:    Reason Eval/Treat Not Completed: Active bedrest order (OT to follow up when activity orders progress.)  Donia Pounds 09/17/2022, 8:30 AM

## 2022-09-17 NOTE — Evaluation (Signed)
Physical Therapy Evaluation Patient Details Name: Judy Chang MRN: [id number] DOB: Apr 24, 1950 Today's Date: 09/17/2022  History of Present Illness  Pt is 72 yo female who presents on 09/16/22 after falling in her driveway and sustaining acute SAH.  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 Pt very emotional on eval, crying throughout session in part due to back pain and in part because she says she has had so much suffering lately and has to start dialysis soon. Pt mobilizing at min A level, ambulated 20' in room with HHA. Recommend HHPT for balance program and RW.  Pt currently with functional limitations due to the deficits listed below (see PT Problem List). Pt will benefit from acute skilled PT to increase their independence and safety with mobility to allow discharge.           If plan is discharge home, recommend the following: A little help with walking and/or transfers;A little help with bathing/dressing/bathroom;Assistance with cooking/housework;Direct supervision/assist for medications management;Direct supervision/assist for financial management;Supervision due to cognitive status;Help with stairs or ramp for entrance;Assist for transportation   Can travel by private vehicle        Equipment Recommendations Rolling walker (2 wheels)  Recommendations for Other Services       Functional Status Assessment Patient has had a recent decline in their functional status and demonstrates the ability to make significant improvements in function in a reasonable and predictable amount of time.     Precautions / Restrictions Precautions Precautions: Fall Restrictions Weight Bearing Restrictions: No      Mobility  Bed Mobility Overal bed mobility: Needs Assistance Bed Mobility: Supine to Sit     Supine to sit: Min assist     General bed mobility comments: min A to help pt  keep back prec for pain control    Transfers Overall transfer level: Needs assistance Equipment used: 1 person hand held assist Transfers: Sit to/from Stand Sit to Stand: Min assist           General transfer comment: min A for power up    Ambulation/Gait Ambulation/Gait assistance: Min assist Gait Distance (Feet): 20 Feet Assistive device: 1 person hand held assist, IV Pole Gait Pattern/deviations: Step-through pattern, Trunk flexed, Shuffle Gait velocity: decreased Gait velocity interpretation: <1.31 ft/sec, indicative of household ambulator   General Gait Details: pt with shuffling steps, tried to increase step length but she reported increased back pain with this.  Stairs            Wheelchair Mobility     Tilt Bed    Modified Rankin (Stroke Patients Only)       Balance Overall balance assessment: Needs assistance Sitting-balance support: Feet supported, No upper extremity supported Sitting balance-Leahy Scale: Fair     Standing balance support: Bilateral upper extremity supported Standing balance-Leahy Scale: Poor Standing balance comment: reliant on UE support                             Pertinent Vitals/Pain Pain Assessment Pain Assessment: Faces Faces Pain Scale: Hurts even more Pain Location: back, R hand, generalized Pain Descriptors / Indicators: Crying Pain Intervention(s): Limited activity within patient's tolerance, Monitored during session, Patient requesting pain meds-RN notified    Home Living Family/patient expects to be discharged to:: Private residence Living Arrangements: Spouse/significant other Available Help at Discharge: Family;Available 24 hours/day Type of Home: Apartment Home  Access: Stairs to enter Entrance Stairs-Rails: Right Entrance Stairs-Number of Steps: 14   Home Layout: One level Home Equipment: Cane - single point Additional Comments: lives with husband    Prior Function Prior Level of Function  : Needs assist             Mobility Comments: pt reports she ambulates with her cane, family not present to verify info. Home info above from previous admission       Extremity/Trunk Assessment   Upper Extremity Assessment Upper Extremity Assessment: Generalized weakness    Lower Extremity Assessment Lower Extremity Assessment: Generalized weakness    Cervical / Trunk Assessment Cervical / Trunk Assessment: Kyphotic  Communication   Communication Communication: No apparent difficulties Cueing Techniques: Verbal cues  Cognition Arousal: Alert Behavior During Therapy: Lability Overall Cognitive Status: History of cognitive impairments - at baseline                                 General Comments: crying throughout session, reports is is because of pain as well as all the suffering she has had recently and because she has to start HD soon. She also reports that she takes oxy at home and it is held currently        General Comments General comments (skin integrity, edema, etc.): BP supine 137/64, sitting 153/54. Pt was able to recall husband's phone number and call him from room phone end of session.    Exercises     Assessment/Plan    PT Assessment Patient needs continued PT services  PT Problem List Decreased strength;Decreased balance;Decreased activity tolerance;Decreased mobility;Decreased cognition;Decreased safety awareness;Decreased knowledge of use of DME;Decreased knowledge of precautions;Pain       PT Treatment Interventions DME instruction;Gait training;Stair training;Functional mobility training;Therapeutic activities;Therapeutic exercise;Balance training;Patient/family education    PT Goals (Current goals can be found in the Care Plan section)  Acute Rehab PT Goals Patient Stated Goal: return home PT Goal Formulation: With patient Time For Goal Achievement: 10/01/22 Potential to Achieve Goals: Fair    Frequency Min 1X/week      Co-evaluation               AM-PAC PT "6 Clicks" Mobility  Outcome Measure Help needed turning from your back to your side while in a flat bed without using bedrails?: A Little Help needed moving from lying on your back to sitting on the side of a flat bed without using bedrails?: A Little Help needed moving to and from a bed to a chair (including a wheelchair)?: A Little Help needed standing up from a chair using your arms (e.g., wheelchair or bedside chair)?: A Little Help needed to walk in hospital room?: A Little Help needed climbing 3-5 steps with a railing? : A Little 6 Click Score: 18    End of Session Equipment Utilized During Treatment: Gait belt Activity Tolerance: Patient limited by pain Patient left: in chair;with call bell/phone within reach;with chair alarm set Nurse Communication: Mobility status PT Visit Diagnosis: Muscle weakness (generalized) (M62.81);Pain;Difficulty in walking, not elsewhere classified (R26.2) Pain - part of body:  (back)    Time: 4098-1191 PT Time Calculation (min) (ACUTE ONLY): 23 min   Charges:   PT Evaluation $PT Eval Moderate Complexity: 1 Mod PT Treatments $Gait Training: 8-22 mins PT General Charges $$ ACUTE PT VISIT: 1 Visit         Lyanne Co, PT  Acute Rehab Services Secure  chat preferred Office [phone]   Lawana Chambers Loistine Eberlin 09/17/2022, 11:56 AM

## 2022-09-17 NOTE — H&P (Signed)
History and Physical      Judy Chang EAV:[id number] DOB: [birth year] DOA: 09/16/2022; DOS: 09/17/2022  PCP: Noberto Retort, MD  Patient coming from: home   I have personally briefly reviewed patient's old medical records in [facility]  Chief Complaint: Fall  HPI: Judy Chang is a 72 y.o. female with medical history significant for essential hypertension, seizures, CKD 4 associated baseline creatinine 1.8-2.5, dementia, GAD, who is admitted to [facility] on 09/16/2022 with acute subarachnoid hemorrhage after presenting from home to [facility] ED complaining of fall.   The patient conveys a ground-level fall in her driveway earlier today in which she struck the left portion of her face on the associated asphalt.  She believes she tripped resulting in this will, she is unsure as to the specific mechanism, but denies any associated loss of consciousness.  Was unwitnessed.  She reports mild headache on the left portion of her head, but otherwise denies any acute arthralgias or myalgias.  No neck pain, and or any acute hip discomfort.  She is not on any blood thinners as an outpatient, including no aspirin.  Denies any chest pain, shortness of breath, palpitations, diaphoresis, dizziness.  Denies any acute focal weakness or any acute focal numbness, paresthesias.  No acute change in vision, dysarthria, facial droop, vertigo.  No recent subjective fever, chills or rigors, generalized myalgias.  No recent dysuria or gross hematuria.     ED Course:  Vital signs in the ED were notable for the following: Afebrile; rates in the 60s to 70s; stop blood pressures initially in the 180s consistently decreasing into the 140s following dose of oral hydralazine, as further outlined below; respiratory rate 17, oxygen saturation 97 to 98% on room air.  Labs were notable for the following: BMP notable for the following: Potassium 4.8, bicarbonate 23, anion gap 8, creatinine 4.72 compared to  most recent prior value of 2.47 on 10/04/2021.  Serum magnesium level 1.6.  CBC notable for white blood cell count 8600, hemoglobin 9.1 associated normocytic/normochromic properties and relative to most recent prior hemoglobin value of 12.5 on 09/15/2021, platelet count 307.  Urinalysis notable for no white blood cells, leukocyte esterase/nitrate negative, will demonstrating small hemoglobin in the absence of any RBCs and showed greater than 300 protein.  Per my interpretation, EKG in ED demonstrated the following: Sinus rhythm with heart rate 68, normal intervals, no evidence of T wave or ST changes, including no evidence of ST elevation.  Imaging in the ED, per corresponding formal radiology read, was notable for the following: CT head showed trace subarachnoid hemorrhage dependently noted in the occipital horn of the left lateral ventricle, will also showing generalized atrophy and chronic small vessel ischemic change.  No evidence of acute infarct.  CT cervical spine showed no evidence of acute cervical spine fracture or subluxation injury.  Plain films of the pelvis showed no evidence of acute fracture.  2 view chest x-ray showed no evidence of acute cardiopulmonary process, including no evidence of infiltrate, edema, effusion, or pneumothorax.  EDP discussed patient's case with on-call neurosurgery, Dr. Yetta Barre, who conveyed no indication for neurosurgical intervention, and did not recommend repeat imaging of the head at this time. Additional neurosurgical consultation available as needed.   While in the ED, the following were administered: Hydralazine 25 mg p.o. x 1 dose.  Subsequently, the patient was admitted for further evaluation and management of his presenting subarachnoid hemorrhage in the setting of ground-level fall, with presenting labs  notable for acute kidney injury superimposed on CKD stage IV, hypomagnesemia, and acute versus subacute anemia.     Review of Systems: As per HPI  otherwise 10 point review of systems negative.   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   pt unaware   Anxiety    Arthritis    Avascular necrosis (HCC)    Chronic left femoral head   Benign essential HTN 10/23/2015   Chondromalacia 02/25/2018   Noted on MRI: Mild   DDD (degenerative disc disease), lumbar 01/10/2011   Noted on MRI   Depression    DJD (degenerative joint disease)    GERD (gastroesophageal reflux disease)    History of cataract    Bilateral   Hypercholesterolemia    Hypertension    Labial tear 02/25/2018   Noted on MRI: smal Left anterior    Lumbar spondylosis 01/10/2011   Noted on MRI   Migraines    Renal cyst 11/17/2015   Notedon [place] Abd: Simple, Right   Spinal stenosis 09/06/2016   Noted on MRI: Moderate-Severe   Vitreous floater, bilateral     Past Surgical History:  Procedure Laterality Date   COLONOSCOPY     PARTIAL HYSTERECTOMY     ROTATOR CUFF REPAIR Left    TONSILLECTOMY     TOTAL HIP ARTHROPLASTY Left 06/22/2018   Procedure: TOTAL HIP ARTHROPLASTY ANTERIOR APPROACH;  Surgeon: Jodi Geralds, MD;  Location: [facility]S;  Service: Orthopedics;  Laterality: Left;    Social History:  reports that she has been smoking cigars. She has never used smokeless tobacco. She reports that she does not currently use alcohol. She reports that she does not currently use drugs after having used the following drugs: Marijuana.   Allergies  Allergen Reactions   Citalopram Hydrobromide     Unknown   Pregabalin     Unknown   Nsaids Hives and Itching    Burning of the tongue     Family History  Problem Relation Age of Onset   Hypertension Mother    Hypertension Father     Family history reviewed and not pertinent    Prior to Admission medications   Medication Sig Start Date End Date Taking? Authorizing Provider  amLODipine (NORVASC) 10 MG tablet Take 5-10 mg by mouth See admin instructions. Take 5 mg on Mon, Wed,  and Fri Take 10 mg on Sun, Tues, Thurs, and Sat    [provider]  amLODipine (NORVASC) 5 MG tablet Take 1 tablet (5 mg total) by mouth daily. Patient taking differently: Take 5 mg by mouth daily. TAKE 5MG  M-W-F 06/24/21   Lyn Records, MD  buPROPion (WELLBUTRIN XL) 150 MG 24 hr tablet Take 150 mg by mouth every morning. 03/12/21   [provider]  carvedilol (COREG) 12.5 MG tablet Take 1 tablet (12.5 mg total) by mouth 2 (two) times daily with a meal. 02/17/19   Meredeth Ide, MD  cloNIDine (CATAPRES - DOSED IN MG/24 HR) 0.2 mg/24hr patch Place 1 patch (0.2 mg total) onto the skin once a week. 02/23/19   Meredeth Ide, MD  DULoxetine (CYMBALTA) 30 MG capsule Take 90 mg by mouth daily. 03/12/21   [provider]  levETIRAcetam (KEPPRA) 500 MG tablet Take 1 tablet (500 mg total) by mouth every 12 (twelve) hours. 02/17/19   Meredeth Ide, MD  LORazepam (ATIVAN) 0.5 MG tablet Take 0.5 mg by mouth daily. 03/12/21   [provider]  Oxycodone HCl 20 MG TABS Take 20 mg by mouth 4 (four) times daily as needed (pain). 10/29/14   [provider]  traZODone (DESYREL) 100 MG tablet Take 100 mg by mouth at bedtime. 03/12/21   [provider]     Objective    Physical Exam: Vitals:   09/16/22 2036 09/16/22 2130 09/16/22 2330 09/17/22 0000  BP: (!) 192/70 (!) 180/82 (!) 142/70 (!) 146/75  Pulse: 67 67    Resp: 17 12 11 12   Temp: 97.9 F (36.6 C)     SpO2: 97% 98%      General: appears to be stated age; alert Skin: warm, dry, no rash Head: Small abrasion noted above the left eye  mouth:  Oral mucosa membranes appear dry, normal dentition Neck: supple; trachea midline Heart:  RRR; did not appreciate any M/R/G Lungs: CTAB, did not appreciate any wheezes, rales, or rhonchi Abdomen: + BS; soft, ND, NT Vascular: 2+ pedal pulses b/l; 2+ radial pulses b/l Extremities: no peripheral edema, no muscle wasting Neuro: strength and sensation intact in upper  and lower extremities b/l     Labs on Admission: I have personally reviewed following labs and imaging studies  CBC: No results for input(s): "WBC", "NEUTROABS", "HGB", "HCT", "MCV", "PLT" in the last 168 hours. Basic Metabolic Panel: No results for input(s): "NA", "K", "CL", "CO2", "GLUCOSE", "BUN", "CREATININE", "CALCIUM", "MG", "PHOS" in the last 168 hours. GFR: CrCl cannot be calculated (Patient's most recent lab result is older than the maximum 21 days allowed.). Liver Function Tests: No results for input(s): "AST", "ALT", "ALKPHOS", "BILITOT", "PROT", "ALBUMIN" in the last 168 hours. No results for input(s): "LIPASE", "AMYLASE" in the last 168 hours. No results for input(s): "AMMONIA" in the last 168 hours. Coagulation Profile: No results for input(s): "INR", "PROTIME" in the last 168 hours. Cardiac Enzymes: No results for input(s): "CKTOTAL", "CKMB", "CKMBINDEX", "TROPONINI" in the last 168 hours. BNP (last 3 results) No results for input(s): "PROBNP" in the last 8760 hours. HbA1C: No results for input(s): "HGBA1C" in the last 72 hours. CBG: No results for input(s): "GLUCAP" in the last 168 hours. Lipid Profile: No results for input(s): "CHOL", "HDL", "LDLCALC", "TRIG", "CHOLHDL", "LDLDIRECT" in the last 72 hours. Thyroid Function Tests: No results for input(s): "TSH", "T4TOTAL", "FREET4", "T3FREE", "THYROIDAB" in the last 72 hours. Anemia Panel: No results for input(s): "VITAMINB12", "FOLATE", "FERRITIN", "TIBC", "IRON", "RETICCTPCT" in the last 72 hours. Urine analysis:    Component Value Date/Time   COLORURINE YELLOW 09/16/2022 2315   APPEARANCEUR CLEAR 09/16/2022 2315   LABSPEC 1.010 09/16/2022 2315   PHURINE 5.0 09/16/2022 2315   GLUCOSEU NEGATIVE 09/16/2022 2315   HGBUR SMALL (A) 09/16/2022 2315   BILIRUBINUR NEGATIVE 09/16/2022 2315   KETONESUR NEGATIVE 09/16/2022 2315   PROTEINUR >=300 (A) 09/16/2022 2315   UROBILINOGEN 0.2 11/25/2014 1651   NITRITE  NEGATIVE 09/16/2022 2315   LEUKOCYTESUR NEGATIVE 09/16/2022 2315    Radiological Exams on Admission: DG Chest 2 View  Result Date: 09/16/2022 CLINICAL DATA:  Fall. EXAM: CHEST - 2 VIEW COMPARISON:  05/08/2021 FINDINGS: Rotated exam. Normal heart size and mediastinal contours allowing for rotation. No pneumothorax, pleural effusion or focal airspace disease. On limited assessment, no acute osseous findings. IMPRESSION: Rotated exam without acute finding. Electronically Signed   By: Narda Rutherford M.D.   On: 09/16/2022 22:58   CT Cervical Spine Wo Contrast  Result Date: 09/16/2022 CLINICAL DATA:  Neck trauma (Age >= 65y) Fall with laceration above left eyebrow. EXAM: CT  CERVICAL SPINE WITHOUT CONTRAST TECHNIQUE: Multidetector CT imaging of the cervical spine was performed without intravenous contrast. Multiplanar CT image reconstructions were also generated. RADIATION DOSE REDUCTION: This exam was performed according to the departmental dose-optimization program which includes automated exposure control, adjustment of the mA and/or kV according to patient size and/or use of iterative reconstruction technique. COMPARISON:  04/19/2022 FINDINGS: Alignment: Slight levo scoliotic curvature. No traumatic subluxation. Skull base and vertebrae: No acute fracture. Vertebral body heights are maintained. Schmorl's node within the inferior endplate of C3, chronic. The dens and skull base are intact. Soft tissues and spinal canal: No prevertebral fluid or swelling. No visible canal hematoma. Disc levels:  Degenerative disc disease is most prominent at C3-C4. Upper chest: Heterogeneously enlarged thyroid gland. This has been evaluated on previous imaging. (ref: J Am Coll Radiol. 2015 Feb;12(2): 143-50).Thyroid ultrasound 05/08/2021 Other: Carotid calcifications. IMPRESSION: Degenerative change in the cervical spine without acute fracture or subluxation. Electronically Signed   By: Narda Rutherford M.D.   On: 09/16/2022  22:38   CT Head Wo Contrast  Result Date: 09/16/2022 CLINICAL DATA:  Headache, post traumatic 72 year old post fall.  Laceration above left eye. EXAM: CT HEAD WITHOUT CONTRAST TECHNIQUE: Contiguous axial images were obtained from the base of the skull through the vertex without intravenous contrast. RADIATION DOSE REDUCTION: This exam was performed according to the departmental dose-optimization program which includes automated exposure control, adjustment of the mA and/or kV according to patient size and/or use of iterative reconstruction technique. COMPARISON:  Head CT 04/19/2022 FINDINGS: Brain: Trace subarachnoid hemorrhage dependently in the occipital horn of the left lateral ventricle, series 2, image 13. No other evidence of hemorrhage. No subdural collection. Generalized atrophy and chronic small vessel ischemic change. No acute ischemia. No hydrocephalus or midline shift. Vascular: Atherosclerosis of skullbase vasculature without hyperdense vessel or abnormal calcification. Skull: No fracture or focal lesion. Sinuses/Orbits: No acute fracture. No mastoid effusion. Bilateral cataract resection. Other: No large scalp hematoma. IMPRESSION: 1. Trace subarachnoid hemorrhage dependently in the occipital horn of the left lateral ventricle. 2. Generalized atrophy and chronic small vessel ischemic change. Critical Value/emergent results were called by telephone at the time of interpretation on 09/16/2022 at 10:34 pm to provider [facility] , who verbally acknowledged these results. Electronically Signed   By: Narda Rutherford M.D.   On: 09/16/2022 22:34   DG Pelvis 1-2 Views  Result Date: 09/16/2022 CLINICAL DATA:  Recent fall EXAM: PELVIS - 1-VIEW COMPARISON:  None Available. FINDINGS: Pelvic ring is intact. Left hip replacement is noted. No soft tissue changes are seen. No acute fracture is noted. IMPRESSION: No acute abnormality noted. Electronically Signed   By: Alcide Clever M.D.   On: 09/16/2022 22:33       Assessment/Plan   Principal Problem:   SAH (subarachnoid hemorrhage) (HCC) Active Problems:   Essential hypertension   Acute renal failure superimposed on stage 4 chronic kidney disease (HCC)   Fall at home, initial encounter   Hypomagnesemia   Acute anemia   GAD (generalized anxiety disorder)   History of seizures     #) Acute subarachnoid hemorrhage: In the setting of presenting ground-level fall, in the absence of any blood thinners as an outpatient, including no aspirin, today's CT head showed trace subarachnoid hemorrhage in the occipital horn of the left lateral ventricle without evidence of midline shift or mass effect.  No evidence of associated acute focal neurologic deficits.  EDP discussed patient's case with on-call neurosurgery, Dr. Yetta Barre, who conveyed no  indication for neurosurgical intervention, and did not recommend repeat imaging of the head at this time. Additional neurosurgical consultation available as needed.   Will closely monitor ensuing blood pressure, and will hold home Coreg for now given increased risk for bradycardia as a result of Cushing's triad.   Plan: Every 4 hours neurochecks.  Check INR, PTT.  Monitor on symmetry.  Prn IV hydralazine ordered for systolic blood pressure greater than 140 mmHg. resume home Norvasc.  Refraining from pharmacologic DVT prophylaxis.  SCDs.               #) Ground-level fall: Incurred earlier today, with the patient striking the left portion of her head, without any reported associated loss of consciousness, although the fall was noted to be unwitnessed, with the patient conveying that she suspects that she tripped resulting in this fall, although the specific mechanism is not entirely clear, as above.  No overt evidence of underlying infectious process to predispose the patient to fall, including urinalysis that was not consistent with UTI while chest x-ray shows no evidence of pneumonia.  She is on multiple  centrally acting medications at home, which may have her predisposing factor to falls, including current outpatient medication list that includes Wellbutrin, Cymbalta, scheduled Ativan, and prn oxycodone.   Plan: Fall precautions ordered.  PT/OT consults placed.  For now, will change scheduled Ativan to as needed only and hold prn oxycodone for now, particularly in the setting of concomitant acute kidney injury.  CMP, CBC in the morning.                  #) Acute Kidney Injury superimposed on stage IV CKD: In the setting of a history of stage IV CKD with baseline creatinine range noted to be 1.8-2.5, with most recent prior serum creatinine data point noted to be 2.47 on 10/04/2021, presenting serum creatinine is found to be 4.72.  Given the nearly 1 year gap in creatinine data points, it is unclear if today's elevated creatinine represents progression of her known CKD stage IV versus a more acute deterioration of her renal function.  In the setting of her fall as well as urinalysis that showed small hemoglobin in the absence of any RBCs, will also check CPK level.  Additionally, as her urinalysis with microscopy showed greater than 300 protein, will also check random urine protein to creatinine ratio.  Increased risk for nephrotoxic implications from home medications in the form of her outpatient use of oxycodone, with increased risk for toxic metabolites, particularly in the setting of worsening renal metabolism/clearance thereof.   Plan: monitor strict I's & O's and daily weights. Attempt to avoid nephrotoxic agents.  Hold outpatient oxycodone for now.  Refrain from NSAIDs. Repeat CMP in the morning. Add-on random urine sodium and random urine creatinine.  add-on random urine protein/cr ratio.  Check CPK level.  Lactated Ringer's at 75 cc/h x 10 hours.  Renal ultrasound ordered.                  #) Hypomagnesemia: presenting serum mag level noted to be 1.6.    Plan:  magnesium sulfate 2 g IV over 2 hours. Monitor on tele. Repeat serum mag level in the AM.                   #) Acute normocytic anemia: Presenting hemoglobin found to be 9.1 relative to most recent prior hemoglobin value of 12.5 and August 2023.  This is associated with normocytic/recurrent  properties.  Given the patient's hemodynamically stable appearance and relative asymptomatic nature, there is suspicion that the patient's decline in hemoglobin may be consistent with a gradual decline in the context of gradually worsening renal function over the course the last year.  Aside from the patient's trivial subarachnoid hemorrhage, no overt evidence of acute blood loss at this time.  Will pursue further diagnostic evaluation and close trending of ensuing hemoglobin level as further outlined below.  As noted above, not on a blood thinners as an outpatient.  Plan: Type and screen.  Check PTT, INR.  Add on iron studies, B12, folic acid level, reticulocyte count.  Every 4 hour H&H's ordered through 9 AM on 09/17/2022.  Further evaluation management of presenting subarachnoid hemorrhage, as above.  Further evaluation management of acute kidney injury superimposed on CKD stage IV, as above.                 #) Generalized anxiety disorder: documented h/o such. On Wellbutrin, Cymbalta, and scheduled Ativan as outpatient.  In the setting of her presenting acute subarachnoid hemorrhage and increased risk for seizure, will hold outpatient Wellbutrin for now given associated decline in seizure threshold with this medication.  Plan: Hold Wellbutrin.  Will change scheduled outpatient Ativan to prn for now.  Continue Cymbalta.                   #) Essential Hypertension: documented h/o such, with outpatient antihypertensive regimen including Norvasc, Coreg, weekly the Konidena patch.  SBP's in the ED today: Initially in the 180s mmHg, subsequent improving into the 140s  following a single dose of oral hydralazine.  Particular in the context of her presenting acute subarachnoid hemorrhage, closely monitor ensuing blood pressure, as further detailed below.  Of note, will hold home Coreg for now in the setting of increased risk for bradycardia given her presenting acute intracranial hemorrhage.  Plan: Close monitoring of subsequent BP via routine VS. hold home Coreg for now, as above.  Monitor on telemetry.  Continue Norvasc.  Prn IV hydralazine ordered for systolic blood pressure greater than 140 mmHg.                   #) History of seizures: Documented history of such, without clinical evidence to suggest active seizures at this time.  However, she is at a relative increased risk for seizures in the context of consequently, we will hold outpatient Wellbutrin for now, which decreases seizure threshold.  Her presenting acute subarachnoid hemorrhage.  outpatient antiepileptic regimen consists of: Keppra.   Plan: Continue outpatient antiepileptic regimen.  Hold outpatient Wellbutrin for now, as above.  Further evaluation management of presenting acute subarachnoid hemorrhage, as above.  Seizure precautions.       DVT prophylaxis: SCD's   Code Status: Full code Family Communication: none Disposition Plan: Per Rounding Team Consults called: EDP d/w case/imaging with on-call neurosurgeon, as further detailed above;  Admission status: Observation     I SPENT GREATER THAN 75  MINUTES IN CLINICAL CARE TIME/MEDICAL DECISION-MAKING IN COMPLETING THIS ADMISSION.      Chaney Born Rubena Roseman DO [facility]  From 7PM - 7AM   09/17/2022, 12:16 AM

## 2022-09-27 DIAGNOSIS — S066X0D Traumatic subarachnoid hemorrhage without loss of consciousness, subsequent encounter: Secondary | ICD-10-CM | POA: Diagnosis not present

## 2022-09-27 DIAGNOSIS — I129 Hypertensive chronic kidney disease with stage 1 through stage 4 chronic kidney disease, or unspecified chronic kidney disease: Secondary | ICD-10-CM | POA: Diagnosis not present

## 2022-09-27 DIAGNOSIS — N184 Chronic kidney disease, stage 4 (severe): Secondary | ICD-10-CM | POA: Diagnosis not present

## 2022-09-27 DIAGNOSIS — D631 Anemia in chronic kidney disease: Secondary | ICD-10-CM | POA: Diagnosis not present

## 2022-09-27 DIAGNOSIS — N179 Acute kidney failure, unspecified: Secondary | ICD-10-CM | POA: Diagnosis not present

## 2022-09-29 DIAGNOSIS — S066X0D Traumatic subarachnoid hemorrhage without loss of consciousness, subsequent encounter: Secondary | ICD-10-CM | POA: Diagnosis not present

## 2022-09-29 DIAGNOSIS — N179 Acute kidney failure, unspecified: Secondary | ICD-10-CM | POA: Diagnosis not present

## 2022-09-29 DIAGNOSIS — D631 Anemia in chronic kidney disease: Secondary | ICD-10-CM | POA: Diagnosis not present

## 2022-09-29 DIAGNOSIS — N184 Chronic kidney disease, stage 4 (severe): Secondary | ICD-10-CM | POA: Diagnosis not present

## 2022-09-29 DIAGNOSIS — I129 Hypertensive chronic kidney disease with stage 1 through stage 4 chronic kidney disease, or unspecified chronic kidney disease: Secondary | ICD-10-CM | POA: Diagnosis not present

## 2022-10-03 NOTE — H&P (View-Only) (Signed)
[facility] [place]  ASSESSMENT / PLAN: Judy Chang is a 72 y.o. right handed female in need of permanent dialysis access. I reviewed options for dialysis in detail with the patient, including hemodialysis and peritoneal dialysis. I counseled the patient to ask their nephrologist about their candidacy for renal transplant. I counseled the patient that dialysis access requires surveillance and periodic maintenance. Plan to proceed with laparoscopic peritoneal dialysis catheter placement.   CHIEF COMPLAINT: in need of dialysis access  HISTORY OF PRESENT ILLNESS: Judy Chang is a 72 y.o. female referred to clinic for advanced chronic disease.  The patient dialysis.  The patient has never had dialysis access before.  The patient desires peritoneal dialysis.  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   pt unaware   Anxiety    Arthritis    Avascular necrosis (HCC)    Chronic left femoral head   Benign essential HTN 10/23/2015   Chondromalacia 02/25/2018   Noted on MRI: Mild   DDD (degenerative disc disease), lumbar 01/10/2011   Noted on MRI   Dementia (HCC)    Depression    DJD (degenerative joint disease)    GERD (gastroesophageal reflux disease)    History of cataract    Bilateral   Hypercholesterolemia    Hypertension    Labial tear 02/25/2018   Noted on MRI: smal Left anterior    Lumbar spondylosis 01/10/2011   Noted on MRI   Migraines    Renal cyst 11/17/2015   Notedon [place] Abd: Simple, Right   Seizures (HCC)    Spinal stenosis 09/06/2016   Noted on MRI: Moderate-Severe   Vitreous floater, bilateral     Past Surgical History:  Procedure Laterality Date   COLONOSCOPY     PARTIAL HYSTERECTOMY     ROTATOR CUFF REPAIR Left    TONSILLECTOMY     TOTAL HIP ARTHROPLASTY Left 06/22/2018   Procedure: TOTAL HIP ARTHROPLASTY ANTERIOR APPROACH;   Surgeon: Jodi Geralds, MD;  Location: [facility];  Service: Orthopedics;  Laterality: Left;    Family History  Problem Relation Age of Onset   Hypertension Mother    Hypertension Father     Social History   Socioeconomic History   Marital status: Married    Spouse name: Not on file   Number of children: 0   Years of education: Not on file   Highest education level: High school graduate  Occupational History   Not on file  Tobacco Use   Smoking status: Every Day    Types: Cigars   Smokeless tobacco: Never   Tobacco comments:    off and on  Vaping Use   Vaping status: Never Used  Substance and Sexual Activity   Alcohol use: Not Currently    Comment: every few days, beer   Drug use: Not Currently    Types: Marijuana    Comment: last time 06/13/2018   Sexual activity: Not on file  Other Topics Concern   Not on file  Social History Narrative   Lives with husband   Right handed   Caffeine: decaf coffee everyday, 2-3 cups   Social Determinants of Health   Financial Resource Strain: Not on file  Food Insecurity: No Food Insecurity (09/17/2022)   Hunger Vital Sign    Worried About Running Out of Food in the Last Year: Never true    Ran Out  of Food in the Last Year: Never true  Transportation Needs: No Transportation Needs (09/17/2022)   PRAPARE - Administrator, Civil Service (Medical): No    Lack of Transportation (Non-Medical): No  Physical Activity: Not on file  Stress: Not on file  Social Connections: Not on file  Intimate Partner Violence: Not At Risk (09/17/2022)   Humiliation, Afraid, Rape, and Kick questionnaire    Fear of Current or Ex-Partner: No    Emotionally Abused: No    Physically Abused: No    Sexually Abused: No    Allergies  Allergen Reactions   Citalopram Hydrobromide     Unknown   Pregabalin     Unknown   Nsaids Hives and Itching    Burning of the tongue     Current Outpatient Medications  Medication Sig Dispense Refill    amLODipine (NORVASC) 10 MG tablet Take 1 tablet (10 mg total) by mouth daily. 30 tablet 1   buPROPion (WELLBUTRIN XL) 150 MG 24 hr tablet Take 1 tablet (150 mg total) by mouth every morning.     carvedilol (COREG) 12.5 MG tablet Take 1 tablet (12.5 mg total) by mouth 2 (two) times daily with a meal. 60 tablet 2   cloNIDine (CATAPRES - DOSED IN MG/24 HR) 0.2 mg/24hr patch Place 1 patch (0.2 mg total) onto the skin once a week. 4 patch 12   DULoxetine (CYMBALTA) 30 MG capsule Take 90 mg by mouth daily.     levETIRAcetam (KEPPRA) 500 MG tablet Take 1 tablet (500 mg total) by mouth every 12 (twelve) hours. 60 tablet 2   LORazepam (ATIVAN) 0.5 MG tablet Take 0.5 mg by mouth daily.     Oxycodone HCl 20 MG TABS Take 20 mg by mouth 4 (four) times daily as needed (pain).  0   traZODone (DESYREL) 100 MG tablet Take 100 mg by mouth at bedtime.     No current facility-administered medications for this visit.    PHYSICAL EXAM There were no vitals filed for this visit.  Elderly thin woman in no distress Regular rate and rhythm Unlabored breathing Nondistended abdomen  PERTINENT LABORATORY AND RADIOLOGIC DATA  Most recent CBC    Latest Ref Rng & Units 09/17/2022   12:48 PM 09/17/2022    6:28 AM 09/17/2022   12:12 AM  CBC  WBC 4.0 - 10.5 K/uL  8.0  8.6   Hemoglobin 12.0 - 15.0 g/dL 8.5  8.6  9.1   Hematocrit 36.0 - 46.0 % 27.7  27.2  30.0   Platelets 150 - 400 K/uL  343  307      Most recent CMP    Latest Ref Rng & Units 09/17/2022    6:28 AM 09/17/2022   12:12 AM 10/04/2021    3:04 PM  CMP  Glucose 70 - 99 mg/dL 79  87  78   BUN 8 - 23 mg/dL 28  27  23    Creatinine 0.44 - 1.00 mg/dL 6.57  8.46  9.62   Sodium 135 - 145 mmol/L 134  133  141   Potassium 3.5 - 5.1 mmol/L 4.8  4.8  5.0   Chloride 98 - 111 mmol/L 99  102  103   CO2 22 - 32 mmol/L 21  23  23    Calcium 8.9 - 10.3 mg/dL 9.3  9.3  9.9   Total Protein 6.5 - 8.1 g/dL 7.4     Total Bilirubin 0.3 - 1.2 mg/dL 0.7  Alkaline Phos  38 - 126 U/L 85     AST 15 - 41 U/L 17     ALT 0 - 44 U/L 10       Renal function CrCl cannot be calculated (Unknown ideal weight.).  Hgb A1c MFr Bld (%)  Date Value  02/08/2019 5.2    LDL Cholesterol  Date Value Ref Range Status  10/22/2015 93 0 - 99 mg/dL Final    Comment:           Total Cholesterol/HDL:CHD Risk Coronary Heart Disease Risk Table                     Men   Women  1/2 Average Risk   3.4   3.3  Average Risk       5.0   4.4  2 X Average Risk   9.6   7.1  3 X Average Risk  23.4   11.0        Use the calculated Patient Ratio above and the CHD Risk Table to determine the patient's CHD Risk.        ATP III CLASSIFICATION (LDL):  <100     mg/dL   Optimal  161-096  mg/dL   Near or Above                    Optimal  130-159  mg/dL   Borderline  045-409  mg/dL   High  >811     mg/dL   Very High     Madline Oesterling N. Lenell Antu, MD FACS [facility] Phone Number: [phone] 10/03/2022 8:28 PM   Total time spent on preparing this encounter including chart review, data review, collecting history, examining the patient, coordinating care for this new patient, 60 minutes.  Portions of this report may have been transcribed using voice recognition software.  Every effort has been made to ensure accuracy; however, inadvertent computerized transcription errors may still be present.

## 2022-10-03 NOTE — Progress Notes (Unsigned)
[facility] [place]  ASSESSMENT / PLAN: Judy Chang is a 72 y.o. right handed female in need of permanent dialysis access. I reviewed options for dialysis in detail with the patient, including hemodialysis and peritoneal dialysis. I counseled the patient to ask their nephrologist about their candidacy for renal transplant. I counseled the patient that dialysis access requires surveillance and periodic maintenance. Plan to proceed with laparoscopic peritoneal dialysis catheter placement.   CHIEF COMPLAINT: in need of dialysis access  HISTORY OF PRESENT ILLNESS: Judy Chang is a 72 y.o. female referred to clinic for advanced chronic disease.  The patient dialysis.  The patient has never had dialysis access before.  The patient desires peritoneal dialysis.  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   pt unaware   Anxiety    Arthritis    Avascular necrosis (HCC)    Chronic left femoral head   Benign essential HTN 10/23/2015   Chondromalacia 02/25/2018   Noted on MRI: Mild   DDD (degenerative disc disease), lumbar 01/10/2011   Noted on MRI   Dementia (HCC)    Depression    DJD (degenerative joint disease)    GERD (gastroesophageal reflux disease)    History of cataract    Bilateral   Hypercholesterolemia    Hypertension    Labial tear 02/25/2018   Noted on MRI: smal Left anterior    Lumbar spondylosis 01/10/2011   Noted on MRI   Migraines    Renal cyst 11/17/2015   Notedon [place] Abd: Simple, Right   Seizures (HCC)    Spinal stenosis 09/06/2016   Noted on MRI: Moderate-Severe   Vitreous floater, bilateral     Past Surgical History:  Procedure Laterality Date   COLONOSCOPY     PARTIAL HYSTERECTOMY     ROTATOR CUFF REPAIR Left    TONSILLECTOMY     TOTAL HIP ARTHROPLASTY Left 06/22/2018   Procedure: TOTAL HIP ARTHROPLASTY ANTERIOR APPROACH;   Surgeon: Jodi Geralds, MD;  Location: [facility];  Service: Orthopedics;  Laterality: Left;    Family History  Problem Relation Age of Onset   Hypertension Mother    Hypertension Father     Social History   Socioeconomic History   Marital status: Married    Spouse name: Not on file   Number of children: 0   Years of education: Not on file   Highest education level: High school graduate  Occupational History   Not on file  Tobacco Use   Smoking status: Every Day    Types: Cigars   Smokeless tobacco: Never   Tobacco comments:    off and on  Vaping Use   Vaping status: Never Used  Substance and Sexual Activity   Alcohol use: Not Currently    Comment: every few days, beer   Drug use: Not Currently    Types: Marijuana    Comment: last time 06/13/2018   Sexual activity: Not on file  Other Topics Concern   Not on file  Social History Narrative   Lives with husband   Right handed   Caffeine: decaf coffee everyday, 2-3 cups   Social Determinants of Health   Financial Resource Strain: Not on file  Food Insecurity: No Food Insecurity (09/17/2022)   Hunger Vital Sign    Worried About Running Out of Food in the Last Year: Never true    Ran Out  of Food in the Last Year: Never true  Transportation Needs: No Transportation Needs (09/17/2022)   PRAPARE - Administrator, Civil Service (Medical): No    Lack of Transportation (Non-Medical): No  Physical Activity: Not on file  Stress: Not on file  Social Connections: Not on file  Intimate Partner Violence: Not At Risk (09/17/2022)   Humiliation, Afraid, Rape, and Kick questionnaire    Fear of Current or Ex-Partner: No    Emotionally Abused: No    Physically Abused: No    Sexually Abused: No    Allergies  Allergen Reactions   Citalopram Hydrobromide     Unknown   Pregabalin     Unknown   Nsaids Hives and Itching    Burning of the tongue     Current Outpatient Medications  Medication Sig Dispense Refill    amLODipine (NORVASC) 10 MG tablet Take 1 tablet (10 mg total) by mouth daily. 30 tablet 1   buPROPion (WELLBUTRIN XL) 150 MG 24 hr tablet Take 1 tablet (150 mg total) by mouth every morning.     carvedilol (COREG) 12.5 MG tablet Take 1 tablet (12.5 mg total) by mouth 2 (two) times daily with a meal. 60 tablet 2   cloNIDine (CATAPRES - DOSED IN MG/24 HR) 0.2 mg/24hr patch Place 1 patch (0.2 mg total) onto the skin once a week. 4 patch 12   DULoxetine (CYMBALTA) 30 MG capsule Take 90 mg by mouth daily.     levETIRAcetam (KEPPRA) 500 MG tablet Take 1 tablet (500 mg total) by mouth every 12 (twelve) hours. 60 tablet 2   LORazepam (ATIVAN) 0.5 MG tablet Take 0.5 mg by mouth daily.     Oxycodone HCl 20 MG TABS Take 20 mg by mouth 4 (four) times daily as needed (pain).  0   traZODone (DESYREL) 100 MG tablet Take 100 mg by mouth at bedtime.     No current facility-administered medications for this visit.    PHYSICAL EXAM There were no vitals filed for this visit.  Elderly thin woman in no distress Regular rate and rhythm Unlabored breathing Nondistended abdomen  PERTINENT LABORATORY AND RADIOLOGIC DATA  Most recent CBC    Latest Ref Rng & Units 09/17/2022   12:48 PM 09/17/2022    6:28 AM 09/17/2022   12:12 AM  CBC  WBC 4.0 - 10.5 K/uL  8.0  8.6   Hemoglobin 12.0 - 15.0 g/dL 8.5  8.6  9.1   Hematocrit 36.0 - 46.0 % 27.7  27.2  30.0   Platelets 150 - 400 K/uL  343  307      Most recent CMP    Latest Ref Rng & Units 09/17/2022    6:28 AM 09/17/2022   12:12 AM 10/04/2021    3:04 PM  CMP  Glucose 70 - 99 mg/dL 79  87  78   BUN 8 - 23 mg/dL 28  27  23    Creatinine 0.44 - 1.00 mg/dL 1.61  0.96  0.45   Sodium 135 - 145 mmol/L 134  133  141   Potassium 3.5 - 5.1 mmol/L 4.8  4.8  5.0   Chloride 98 - 111 mmol/L 99  102  103   CO2 22 - 32 mmol/L 21  23  23    Calcium 8.9 - 10.3 mg/dL 9.3  9.3  9.9   Total Protein 6.5 - 8.1 g/dL 7.4     Total Bilirubin 0.3 - 1.2 mg/dL 0.7  Alkaline Phos  38 - 126 U/L 85     AST 15 - 41 U/L 17     ALT 0 - 44 U/L 10       Renal function CrCl cannot be calculated (Unknown ideal weight.).  Hgb A1c MFr Bld (%)  Date Value  02/08/2019 5.2    LDL Cholesterol  Date Value Ref Range Status  10/22/2015 93 0 - 99 mg/dL Final    Comment:           Total Cholesterol/HDL:CHD Risk Coronary Heart Disease Risk Table                     Men   Women  1/2 Average Risk   3.4   3.3  Average Risk       5.0   4.4  2 X Average Risk   9.6   7.1  3 X Average Risk  23.4   11.0        Use the calculated Patient Ratio above and the CHD Risk Table to determine the patient's CHD Risk.        ATP III CLASSIFICATION (LDL):  <100     mg/dL   Optimal  161-096  mg/dL   Near or Above                    Optimal  130-159  mg/dL   Borderline  045-409  mg/dL   High  >811     mg/dL   Very High     Kaylah Chiasson N. Lenell Antu, MD FACS [facility] Phone Number: [phone] 10/03/2022 8:28 PM   Total time spent on preparing this encounter including chart review, data review, collecting history, examining the patient, coordinating care for this new patient, 60 minutes.  Portions of this report may have been transcribed using voice recognition software.  Every effort has been made to ensure accuracy; however, inadvertent computerized transcription errors may still be present.

## 2022-10-04 ENCOUNTER — Ambulatory Visit: Payer: Medicare Other | Admitting: Vascular Surgery

## 2022-10-04 ENCOUNTER — Encounter: Payer: Self-pay | Admitting: Vascular Surgery

## 2022-10-04 VITALS — BP 178/75 | HR 67 | Temp 98.2°F | Resp 20 | Ht 61.0 in | Wt 105.0 lb

## 2022-10-04 DIAGNOSIS — N184 Chronic kidney disease, stage 4 (severe): Secondary | ICD-10-CM | POA: Diagnosis not present

## 2022-10-04 DIAGNOSIS — I129 Hypertensive chronic kidney disease with stage 1 through stage 4 chronic kidney disease, or unspecified chronic kidney disease: Secondary | ICD-10-CM | POA: Diagnosis not present

## 2022-10-04 DIAGNOSIS — S066X0D Traumatic subarachnoid hemorrhage without loss of consciousness, subsequent encounter: Secondary | ICD-10-CM | POA: Diagnosis not present

## 2022-10-04 DIAGNOSIS — N185 Chronic kidney disease, stage 5: Secondary | ICD-10-CM | POA: Diagnosis not present

## 2022-10-04 DIAGNOSIS — D631 Anemia in chronic kidney disease: Secondary | ICD-10-CM | POA: Diagnosis not present

## 2022-10-04 DIAGNOSIS — N179 Acute kidney failure, unspecified: Secondary | ICD-10-CM | POA: Diagnosis not present

## 2022-10-05 DIAGNOSIS — I120 Hypertensive chronic kidney disease with stage 5 chronic kidney disease or end stage renal disease: Secondary | ICD-10-CM | POA: Diagnosis not present

## 2022-10-05 DIAGNOSIS — G4700 Insomnia, unspecified: Secondary | ICD-10-CM | POA: Diagnosis not present

## 2022-10-05 DIAGNOSIS — I5189 Other ill-defined heart diseases: Secondary | ICD-10-CM | POA: Diagnosis not present

## 2022-10-05 DIAGNOSIS — N185 Chronic kidney disease, stage 5: Secondary | ICD-10-CM | POA: Diagnosis not present

## 2022-10-05 DIAGNOSIS — G894 Chronic pain syndrome: Secondary | ICD-10-CM | POA: Diagnosis not present

## 2022-10-05 DIAGNOSIS — E041 Nontoxic single thyroid nodule: Secondary | ICD-10-CM | POA: Diagnosis not present

## 2022-10-05 DIAGNOSIS — N2581 Secondary hyperparathyroidism of renal origin: Secondary | ICD-10-CM | POA: Diagnosis not present

## 2022-10-05 DIAGNOSIS — E46 Unspecified protein-calorie malnutrition: Secondary | ICD-10-CM | POA: Diagnosis not present

## 2022-10-05 DIAGNOSIS — J9611 Chronic respiratory failure with hypoxia: Secondary | ICD-10-CM | POA: Diagnosis not present

## 2022-10-06 ENCOUNTER — Other Ambulatory Visit: Payer: Self-pay

## 2022-10-06 DIAGNOSIS — N184 Chronic kidney disease, stage 4 (severe): Secondary | ICD-10-CM | POA: Diagnosis not present

## 2022-10-06 DIAGNOSIS — N185 Chronic kidney disease, stage 5: Secondary | ICD-10-CM

## 2022-10-06 DIAGNOSIS — S066X0D Traumatic subarachnoid hemorrhage without loss of consciousness, subsequent encounter: Secondary | ICD-10-CM | POA: Diagnosis not present

## 2022-10-06 DIAGNOSIS — N179 Acute kidney failure, unspecified: Secondary | ICD-10-CM | POA: Diagnosis not present

## 2022-10-06 DIAGNOSIS — D631 Anemia in chronic kidney disease: Secondary | ICD-10-CM | POA: Diagnosis not present

## 2022-10-06 DIAGNOSIS — I129 Hypertensive chronic kidney disease with stage 1 through stage 4 chronic kidney disease, or unspecified chronic kidney disease: Secondary | ICD-10-CM | POA: Diagnosis not present

## 2022-10-11 ENCOUNTER — Encounter (HOSPITAL_COMMUNITY): Payer: Self-pay | Admitting: Vascular Surgery

## 2022-10-11 NOTE — Progress Notes (Signed)
SDW call  Patient's husband, Judy Chang was given pre-op instructions over the phone. He verbalized understanding of instructions provided.     PCP - Dr. Johny Blamer Cardiologist - Dr. Verdis Prime Pulmonary:    PPM/ICD - denies Device Orders - n/a Rep Notified - n/a   Chest x-ray - 09/16/2022 EKG -  09/16/2022 Stress Test - 10/23/2015 ECHO - 05/10/2021 Cardiac Cath -   Sleep Study/sleep apnea/CPAP: denies  Non-diabetic  Blood Thinner Instructions: denies Aspirin Instructions:denies   ERAS Protcol - NPO   COVID TEST- n/a    Anesthesia review: Yes. HTN, seizures, high cholesterol   Your procedure is scheduled on Thursday October 13, 2022  Report to [facility] Main Entrance "A" at 0530   A.M., then check in with the Admitting office.  Call this number if you have problems the morning of surgery:  [phone]   If you have any questions prior to your surgery date call [phone]: Open Monday-Friday 8am-4pm If you experience any cold or flu symptoms such as cough, fever, chills, shortness of breath, etc. between now and your scheduled surgery, please notify us at the above number     Remember:  Do not eat or drink after midnight the night before your surgery  Take these medicines the morning of surgery with A SIP OF WATER:  Amlodipine, wellbutrin, carvedilol, duloxetine, keppra  As needed: oxycodone  As of today, STOP taking any Aspirin (unless otherwise instructed by your surgeon) Aleve, Naproxen, Ibuprofen, Motrin, Advil, Goody's, BC's, all herbal medications, fish oil, and all vitamins.

## 2022-10-12 NOTE — Progress Notes (Signed)
Anesthesia Chart Review: Maury Dus  Case: [id number] Date/Time: 10/13/22 0715   Procedures:      LAPAROSCOPIC INSERTION CONTINUOUS AMBULATORY PERITONEAL DIALYSIS  (CAPD) CATHETER     POSSIBLE LAPAROSCOPIC OMENTOPEXY   Anesthesia type: General   Pre-op diagnosis: Chronic kidney disease, stage V   [facility] ROOM 02 / [facility]   Surgeons: Leonie Douglas, MD       DISCUSSION: Patient is a 72 year old female scheduled for the above procedure.  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  She was admitted to [facility] 09/16/22 -09/17/22 for ground level fall with trace SAH dependently in the occipital horn of the left lateral ventricle. Per Discharge Summary, "-o evidence of associated acute focal neurologic deficits.  -EDP discussed patient's case with on-call neurosurgery, Dr. Yetta Barre, who conveyed no indication for neurosurgical intervention, and did not recommend repeat imaging of the head at this time but did recommend controlling BP".    Reviewed events and neurosurgery input from August's overnight stay with anesthesiologist Leslye Peer, MD. Neurosurgery did not recommend repeat imaging. Evaluated by Dr. Lenell Antu since then. Anesthesia team to evaluate on the day of surgery.   VS:  BP Readings from Last 3 Encounters:  10/04/22 (!) 178/75  09/17/22 132/66  04/19/22 (!) 140/64   Pulse Readings from Last 3 Encounters:  10/04/22 67  09/17/22 64  04/19/22 (!) 55     PROVIDERS: Noberto Retort, MD is PCP  - She had cardiology evaluation by now retired Verdis Prime, MD in 2023 for stage 1 diastolic dysfunction with LE edema. No chest pain. No evidence of volume overload on Lasix at that time. No evidence or RH failure. Monitoring renal  function. As needed follow-up recommended at 10/04/21 visit.   LABS: For day of surgery. Last results in [facility] include: Lab Results  Component Value Date   WBC 8.0 09/17/2022   HGB 8.5 (L) 09/17/2022   HCT 27.7 (L) 09/17/2022   PLT 343 09/17/2022   GLUCOSE 79 09/17/2022   ALT 10 09/17/2022   AST 17 09/17/2022   NA 134 (L) 09/17/2022   K 4.8 09/17/2022   CL 99 09/17/2022   CREATININE 4.45 (H) 09/17/2022   BUN 28 (H) 09/17/2022   CO2 21 (L) 09/17/2022   TSH 0.378 05/09/2021   INR 1.0 09/17/2022    IMAGES: US Renal 09/17/22: IMPRESSION: 1. Echogenic renal parenchyma bilaterally is consistent with medical renal disease. 2. Bilateral simple renal cysts. No follow-up imaging is recommended. 3. No obstruction or mass lesion.   CXR 09/17/22: FIINDINGS: Rotated exam. Normal heart size and mediastinal contours allowing for rotation. No pneumothorax, pleural effusion or focal airspace disease. On limited assessment, no acute osseous findings.  IMPRESSION: Rotated exam without acute finding.  CT Head 09/16/22: IMPRESSION: 1. Trace subarachnoid hemorrhage dependently in the occipital horn of the left lateral ventricle. 2. Generalized atrophy and chronic small vessel ischemic change.  CT C-spine 09/16/22: IMPRESSION: Degenerative change in the cervical spine without acute fracture or subluxation.    EKG: 09/16/22: NSR   CV: Echo 05/10/21: IMPRESSIONS   1. Left ventricular ejection fraction, by estimation, is 60 to 65%. The  left ventricle has normal function. The left ventricle has no regional  wall motion abnormalities. Left ventricular diastolic parameters are  consistent with Grade I diastolic  dysfunction (impaired relaxation).   2. Right ventricular systolic function is normal. The right ventricular  size is normal. There is moderately elevated pulmonary artery systolic  pressure. The estimated right ventricular systolic pressure is 56.2 mmHg.   3. The mitral valve is  normal in structure. No evidence of mitral valve  regurgitation. No evidence of mitral stenosis.   4. The aortic valve is tricuspid. Aortic valve regurgitation is not  visualized. No aortic stenosis is present.   5. The inferior vena cava is normal in size with <50% respiratory  variability, suggesting right atrial pressure of 8 mmHg.   Stress Test 10/23/15: Blood pressure demonstrated a normal response to exercise. There was no ST segment deviation noted during stress. No T wave inversion was noted during stress.   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   pt unaware   Anxiety    Arthritis    Avascular necrosis (HCC)    Chronic left femoral head   Benign essential HTN 10/23/2015   Chondromalacia 02/25/2018   Noted on MRI: Mild   DDD (degenerative disc disease), lumbar 01/10/2011   Noted on MRI   Dementia (HCC)    Depression    DJD (degenerative joint disease)    GERD (gastroesophageal reflux disease)    History of cataract    Bilateral   Hypercholesterolemia    Hypertension    Labial tear 02/25/2018   Noted on MRI: smal Left anterior    Lumbar spondylosis 01/10/2011   Noted on MRI   Migraines    Renal cyst 11/17/2015   Notedon [place] Abd: Simple, Right   Seizures (HCC)    Spinal stenosis 09/06/2016   Noted on MRI: Moderate-Severe   Vitreous floater, bilateral     Past Surgical History:  Procedure Laterality Date   COLONOSCOPY     PARTIAL HYSTERECTOMY     ROTATOR CUFF REPAIR Left    TONSILLECTOMY     TOTAL HIP ARTHROPLASTY Left 06/22/2018   Procedure: TOTAL HIP ARTHROPLASTY ANTERIOR APPROACH;  Surgeon: Jodi Geralds, MD;  Location: [facility];  Service: Orthopedics;  Laterality: Left;    MEDICATIONS: No current facility-administered medications for this encounter.    amLODipine (NORVASC) 10 MG tablet   buPROPion (WELLBUTRIN XL) 150 MG 24 hr tablet   carvedilol (COREG) 12.5 MG tablet   cloNIDine (CATAPRES - DOSED IN MG/24  HR) 0.2 mg/24hr patch   DULoxetine (CYMBALTA) 30 MG capsule   levETIRAcetam (KEPPRA) 500 MG tablet   oxycodone (ROXICODONE) 30 MG immediate release tablet   spironolactone (ALDACTONE) 25 MG tablet   traZODone (DESYREL) 100 MG tablet    Shonna Chock, PA-C Surgical Short Stay/Anesthesiology [facility] Phone [phone] [facility] Phone [phone] 10/12/2022 12:17 PM

## 2022-10-12 NOTE — Anesthesia Preprocedure Evaluation (Signed)
Anesthesia Evaluation  Patient identified by MRN, date of birth, ID band Patient awake    Reviewed: Allergy & Precautions, NPO status , Patient's Chart, lab work & pertinent test results, reviewed documented beta blocker date and time   Airway Mallampati: III  TM Distance: >3 FB Neck ROM: Full    Dental  (+) Edentulous Upper, Dental Advisory Given, Chipped, Missing, Poor Dentition   Pulmonary neg pulmonary ROS   Pulmonary exam normal breath sounds clear to auscultation       Cardiovascular hypertension, Pt. on medications and Pt. on home beta blockers Normal cardiovascular exam Rhythm:Regular Rate:Normal  TTE 2023 1. Left ventricular ejection fraction, by estimation, is 60 to 65%. The  left ventricle has normal function. The left ventricle has no regional  wall motion abnormalities. Left ventricular diastolic parameters are  consistent with Grade I diastolic  dysfunction (impaired relaxation).   2. Right ventricular systolic function is normal. The right ventricular  size is normal. There is moderately elevated pulmonary artery systolic  pressure. The estimated right ventricular systolic pressure is 56.2 mmHg.   3. The mitral valve is normal in structure. No evidence of mitral valve  regurgitation. No evidence of mitral stenosis.   4. The aortic valve is tricuspid. Aortic valve regurgitation is not  visualized. No aortic stenosis is present.   5. The inferior vena cava is normal in size with <50% respiratory  variability, suggesting right atrial pressure of 8 mmHg.     Neuro/Psych  Headaches, Seizures -,  PSYCHIATRIC DISORDERS Anxiety Depression   Dementia    GI/Hepatic Neg liver ROS,GERD  ,,  Endo/Other  negative endocrine ROS    Renal/GU CRFRenal diseaseCKD stage V  negative genitourinary   Musculoskeletal  (+) Arthritis ,    Abdominal   Peds  Hematology  (+) Blood dyscrasia, anemia   Anesthesia Other  Findings   Reproductive/Obstetrics                             Anesthesia Physical Anesthesia Plan  ASA: 3  Anesthesia Plan: General   Post-op Pain Management: Tylenol PO (pre-op)*   Induction: Intravenous  PONV Risk Score and Plan: 3 and Dexamethasone, Ondansetron and Treatment may vary due to age or medical condition  Airway Management Planned: Oral ETT  Additional Equipment:   Intra-op Plan:   Post-operative Plan: Extubation in OR  Informed Consent: I have reviewed the patients History and Physical, chart, labs and discussed the procedure including the risks, benefits and alternatives for the proposed anesthesia with the patient or authorized representative who has indicated his/her understanding and acceptance.     Dental advisory given  Plan Discussed with: CRNA  Anesthesia Plan Comments: (PAT note written 10/12/2022 by Shonna Chock, PA-C.  )       Anesthesia Quick Evaluation

## 2022-10-13 ENCOUNTER — Ambulatory Visit (HOSPITAL_BASED_OUTPATIENT_CLINIC_OR_DEPARTMENT_OTHER): Payer: Medicare Other | Admitting: Vascular Surgery

## 2022-10-13 ENCOUNTER — Ambulatory Visit (HOSPITAL_COMMUNITY)
Admission: RE | Admit: 2022-10-13 | Discharge: 2022-10-13 | Disposition: A | Discharge status: Home / Self Care | Payer: Medicare Other | Attending: Vascular Surgery | Admitting: Vascular Surgery

## 2022-10-13 ENCOUNTER — Other Ambulatory Visit: Payer: Self-pay

## 2022-10-13 ENCOUNTER — Ambulatory Visit (HOSPITAL_COMMUNITY): Payer: Medicare Other | Admitting: Vascular Surgery

## 2022-10-13 ENCOUNTER — Encounter (HOSPITAL_COMMUNITY)
Admission: RE | Disposition: A | Discharge status: Home / Self Care | Payer: Self-pay | Source: Home / Self Care | Attending: Vascular Surgery

## 2022-10-13 ENCOUNTER — Encounter (HOSPITAL_COMMUNITY): Payer: Self-pay | Admitting: Vascular Surgery

## 2022-10-13 DIAGNOSIS — N184 Chronic kidney disease, stage 4 (severe): Secondary | ICD-10-CM | POA: Diagnosis not present

## 2022-10-13 DIAGNOSIS — I129 Hypertensive chronic kidney disease with stage 1 through stage 4 chronic kidney disease, or unspecified chronic kidney disease: Secondary | ICD-10-CM

## 2022-10-13 DIAGNOSIS — F17290 Nicotine dependence, other tobacco product, uncomplicated: Secondary | ICD-10-CM | POA: Insufficient documentation

## 2022-10-13 DIAGNOSIS — F418 Other specified anxiety disorders: Secondary | ICD-10-CM | POA: Diagnosis not present

## 2022-10-13 DIAGNOSIS — I120 Hypertensive chronic kidney disease with stage 5 chronic kidney disease or end stage renal disease: Secondary | ICD-10-CM | POA: Insufficient documentation

## 2022-10-13 DIAGNOSIS — K219 Gastro-esophageal reflux disease without esophagitis: Secondary | ICD-10-CM | POA: Diagnosis not present

## 2022-10-13 DIAGNOSIS — N185 Chronic kidney disease, stage 5: Secondary | ICD-10-CM | POA: Diagnosis not present

## 2022-10-13 DIAGNOSIS — F0394 Unspecified dementia, unspecified severity, with anxiety: Secondary | ICD-10-CM | POA: Diagnosis not present

## 2022-10-13 HISTORY — PX: CAPD INSERTION: SHX5233

## 2022-10-13 LAB — POCT I-STAT, CHEM 8
BUN: 35 mg/dL — ABNORMAL HIGH (ref 8–23)
Calcium, Ion: 1.23 mmol/L (ref 1.15–1.40)
Chloride: 114 mmol/L — ABNORMAL HIGH (ref 98–111)
Creatinine, Ser: 5.8 mg/dL — ABNORMAL HIGH (ref 0.44–1.00)
Glucose, Bld: 76 mg/dL (ref 70–99)
HCT: 28 % — ABNORMAL LOW (ref 36.0–46.0)
Hemoglobin: 9.5 g/dL — ABNORMAL LOW (ref 12.0–15.0)
Potassium: 5.4 mmol/L — ABNORMAL HIGH (ref 3.5–5.1)
Sodium: 140 mmol/L (ref 135–145)
TCO2: 19 mmol/L — ABNORMAL LOW (ref 22–32)

## 2022-10-13 SURGERY — LAPAROSCOPIC INSERTION CONTINUOUS AMBULATORY PERITONEAL DIALYSIS  (CAPD) CATHETER
Anesthesia: General | Site: Abdomen

## 2022-10-13 MED ORDER — CEFAZOLIN SODIUM-DEXTROSE 2-4 GM/100ML-% IV SOLN
2.0000 g | INTRAVENOUS | Status: AC
  Administered 2022-10-13: 2 g via INTRAVENOUS
  Filled 2022-10-13: qty 100

## 2022-10-13 MED ORDER — MIDAZOLAM HCL 2 MG/2ML IJ SOLN
INTRAMUSCULAR | Status: AC
  Filled 2022-10-13: qty 2

## 2022-10-13 MED ORDER — CHLORHEXIDINE GLUCONATE 4 % EX SOLN: 60.0000 mL | Freq: Once | CUTANEOUS | Status: DC

## 2022-10-13 MED ORDER — IPRATROPIUM-ALBUTEROL 0.5-2.5 (3) MG/3ML IN SOLN
3.0000 mL | Freq: Once | RESPIRATORY_TRACT | Status: AC
  Administered 2022-10-13: 3 mL via RESPIRATORY_TRACT

## 2022-10-13 MED ORDER — IPRATROPIUM-ALBUTEROL 0.5-2.5 (3) MG/3ML IN SOLN
RESPIRATORY_TRACT | Status: AC
  Filled 2022-10-13: qty 3

## 2022-10-13 MED ORDER — SODIUM CHLORIDE 0.9 % IR SOLN
Status: DC | PRN
  Administered 2022-10-13: 1000 mL

## 2022-10-13 MED ORDER — LIDOCAINE 2% (20 MG/ML) 5 ML SYRINGE
INTRAMUSCULAR | Status: AC
  Filled 2022-10-13: qty 5

## 2022-10-13 MED ORDER — FENTANYL CITRATE (PF) 100 MCG/2ML IJ SOLN
25.0000 ug | INTRAMUSCULAR | Status: DC | PRN
  Administered 2022-10-13: 50 ug via INTRAVENOUS

## 2022-10-13 MED ORDER — SODIUM CHLORIDE 0.9 % IV SOLN: INTRAVENOUS | Status: DC

## 2022-10-13 MED ORDER — HEPARIN 6000 UNIT IRRIGATION SOLUTION
Status: AC
  Filled 2022-10-13: qty 500

## 2022-10-13 MED ORDER — FENTANYL CITRATE (PF) 250 MCG/5ML IJ SOLN
INTRAMUSCULAR | Status: DC | PRN
  Administered 2022-10-13: 50 ug via INTRAVENOUS
  Administered 2022-10-13: 100 ug via INTRAVENOUS

## 2022-10-13 MED ORDER — MIDAZOLAM HCL 2 MG/2ML IJ SOLN
INTRAMUSCULAR | Status: DC | PRN
  Administered 2022-10-13: 2 mg via INTRAVENOUS

## 2022-10-13 MED ORDER — ACETAMINOPHEN 500 MG PO TABS
1000.0000 mg | ORAL_TABLET | Freq: Once | ORAL | Status: DC
  Filled 2022-10-13: qty 2

## 2022-10-13 MED ORDER — LABETALOL HCL 5 MG/ML IV SOLN
INTRAVENOUS | Status: DC | PRN
  Administered 2022-10-13 (×2): 5 mg via INTRAVENOUS

## 2022-10-13 MED ORDER — LACTATED RINGERS IV SOLN: INTRAVENOUS | Status: DC

## 2022-10-13 MED ORDER — CARVEDILOL 12.5 MG PO TABS: 12.5000 mg | ORAL_TABLET | Freq: Once | ORAL | Status: DC

## 2022-10-13 MED ORDER — ROCURONIUM BROMIDE 10 MG/ML (PF) SYRINGE
PREFILLED_SYRINGE | INTRAVENOUS | Status: AC
  Filled 2022-10-13: qty 10

## 2022-10-13 MED ORDER — 0.9 % SODIUM CHLORIDE (POUR BTL) OPTIME
TOPICAL | Status: DC | PRN
  Administered 2022-10-13: 1000 mL

## 2022-10-13 MED ORDER — CARVEDILOL 12.5 MG PO TABS
ORAL_TABLET | ORAL | Status: AC
  Filled 2022-10-13: qty 1

## 2022-10-13 MED ORDER — HEPARIN 6000 UNIT IRRIGATION SOLUTION
Status: DC | PRN
  Administered 2022-10-13: 1

## 2022-10-13 MED ORDER — FENTANYL CITRATE (PF) 100 MCG/2ML IJ SOLN
INTRAMUSCULAR | Status: AC
  Filled 2022-10-13: qty 2

## 2022-10-13 MED ORDER — ROCURONIUM BROMIDE 10 MG/ML (PF) SYRINGE
PREFILLED_SYRINGE | INTRAVENOUS | Status: DC | PRN
  Administered 2022-10-13: 10 mg via INTRAVENOUS
  Administered 2022-10-13: 40 mg via INTRAVENOUS

## 2022-10-13 MED ORDER — PHENYLEPHRINE 80 MCG/ML (10ML) SYRINGE FOR IV PUSH (FOR BLOOD PRESSURE SUPPORT)
PREFILLED_SYRINGE | INTRAVENOUS | Status: DC | PRN
  Administered 2022-10-13: 80 ug via INTRAVENOUS

## 2022-10-13 MED ORDER — LIDOCAINE 2% (20 MG/ML) 5 ML SYRINGE
INTRAMUSCULAR | Status: DC | PRN
  Administered 2022-10-13: 20 mg via INTRAVENOUS

## 2022-10-13 MED ORDER — BUPIVACAINE-EPINEPHRINE (PF) 0.25% -1:200000 IJ SOLN
INTRAMUSCULAR | Status: AC
  Filled 2022-10-13: qty 30

## 2022-10-13 MED ORDER — CHLORHEXIDINE GLUCONATE 0.12 % MT SOLN
15.0000 mL | Freq: Once | OROMUCOSAL | Status: AC
  Administered 2022-10-13: 15 mL via OROMUCOSAL
  Filled 2022-10-13: qty 15

## 2022-10-13 MED ORDER — PROPOFOL 10 MG/ML IV BOLUS
INTRAVENOUS | Status: DC | PRN
  Administered 2022-10-13: 50 mg via INTRAVENOUS

## 2022-10-13 MED ORDER — SUGAMMADEX SODIUM 200 MG/2ML IV SOLN
INTRAVENOUS | Status: DC | PRN
  Administered 2022-10-13: 100 mg via INTRAVENOUS

## 2022-10-13 MED ORDER — ONDANSETRON HCL 4 MG/2ML IJ SOLN
INTRAMUSCULAR | Status: DC | PRN
  Administered 2022-10-13: 4 mg via INTRAVENOUS

## 2022-10-13 MED ORDER — BUPIVACAINE-EPINEPHRINE 0.25% -1:200000 IJ SOLN
INTRAMUSCULAR | Status: DC | PRN
  Administered 2022-10-13: 10 mL

## 2022-10-13 MED ORDER — PROPOFOL 10 MG/ML IV BOLUS
INTRAVENOUS | Status: AC
  Filled 2022-10-13: qty 20

## 2022-10-13 MED ORDER — DEXAMETHASONE SODIUM PHOSPHATE 10 MG/ML IJ SOLN
INTRAMUSCULAR | Status: AC
  Filled 2022-10-13: qty 1

## 2022-10-13 MED ORDER — ORAL CARE MOUTH RINSE: 15.0000 mL | Freq: Once | OROMUCOSAL | Status: AC

## 2022-10-13 MED ORDER — FENTANYL CITRATE (PF) 250 MCG/5ML IJ SOLN
INTRAMUSCULAR | Status: AC
  Filled 2022-10-13: qty 5

## 2022-10-13 SURGICAL SUPPLY — 55 items
ADAPTER TITANIUM MEDIONICS (MISCELLANEOUS) ×1 IMPLANT
ADH SKN CLS APL DERMABOND .7 (GAUZE/BANDAGES/DRESSINGS) ×1
ADPR DLYS CATH STRL LF DISP (MISCELLANEOUS) ×1
APL PRP STRL LF DISP 70% ISPRP (MISCELLANEOUS) ×1
APPLIER CLIP 5 13 M/L LIGAMAX5 (MISCELLANEOUS)
APR CLP MED LRG 5 ANG JAW (MISCELLANEOUS)
BAG DECANTER FOR FLEXI CONT (MISCELLANEOUS) ×1 IMPLANT
BIOPATCH RED 1 DISK 7.0 (GAUZE/BANDAGES/DRESSINGS) ×1 IMPLANT
BLADE CLIPPER SURG (BLADE) IMPLANT
BLADE SURG 11 STRL SS (BLADE) ×1 IMPLANT
CATH EXTENDED DIALYSIS (CATHETERS) IMPLANT
CHLORAPREP W/TINT 26 (MISCELLANEOUS) ×1 IMPLANT
CLIP APPLIE 5 13 M/L LIGAMAX5 (MISCELLANEOUS) IMPLANT
COVER SURGICAL LIGHT HANDLE (MISCELLANEOUS) ×1 IMPLANT
DERMABOND ADVANCED .7 DNX12 (GAUZE/BANDAGES/DRESSINGS) ×1 IMPLANT
DEVICE TROCAR PUNCTURE CLOSURE (ENDOMECHANICALS) ×1 IMPLANT
DRSG TEGADERM 4X4.75 (GAUZE/BANDAGES/DRESSINGS) ×3 IMPLANT
ELECT REM PT RETURN 9FT ADLT (ELECTROSURGICAL) ×1
ELECTRODE REM PT RTRN 9FT ADLT (ELECTROSURGICAL) ×1 IMPLANT
GAUZE SPONGE 4X4 12PLY STRL (GAUZE/BANDAGES/DRESSINGS) ×1 IMPLANT
GOWN STRL REUS W/ TWL LRG LVL3 (GOWN DISPOSABLE) ×2 IMPLANT
GOWN STRL REUS W/ TWL XL LVL3 (GOWN DISPOSABLE) ×1 IMPLANT
GOWN STRL REUS W/TWL LRG LVL3 (GOWN DISPOSABLE) ×2
GOWN STRL REUS W/TWL XL LVL3 (GOWN DISPOSABLE) ×1
GRASPER SUT TROCAR 14GX15 (MISCELLANEOUS) ×1 IMPLANT
IRRIG SUCT STRYKERFLOW 2 WTIP (MISCELLANEOUS)
IRRIGATION SUCT STRKRFLW 2 WTP (MISCELLANEOUS) IMPLANT
IV NS 1000ML (IV SOLUTION) ×1
IV NS 1000ML BAXH (IV SOLUTION) ×1 IMPLANT
KIT BASIN OR (CUSTOM PROCEDURE TRAY) ×1 IMPLANT
KIT TURNOVER KIT B (KITS) ×1 IMPLANT
NDL INSUFFLATION 14GA 120MM (NEEDLE) ×1 IMPLANT
NEEDLE INSUFFLATION 14GA 120MM (NEEDLE) ×1 IMPLANT
NS IRRIG 1000ML POUR BTL (IV SOLUTION) ×1 IMPLANT
PAD ARMBOARD 7.5X6 YLW CONV (MISCELLANEOUS) ×2 IMPLANT
POWDER SURGICEL 3.0 GRAM (HEMOSTASIS) IMPLANT
SCISSORS LAP 5X35 DISP (ENDOMECHANICALS) IMPLANT
SET CYSTO W/LG BORE CLAMP LF (SET/KITS/TRAYS/PACK) ×1 IMPLANT
SET EXT 12IN DIALYSIS STAY-SAF (MISCELLANEOUS) ×1 IMPLANT
SET TUBE SMOKE EVAC HIGH FLOW (TUBING) ×1 IMPLANT
SLEEVE Z-THREAD 5X100MM (TROCAR) ×2 IMPLANT
SPIKE FLUID TRANSFER (MISCELLANEOUS) ×1 IMPLANT
STYLET FALLER (MISCELLANEOUS) ×1 IMPLANT
STYLET FALLER MEDIONICS (MISCELLANEOUS) ×1 IMPLANT
SUT MNCRL AB 4-0 PS2 18 (SUTURE) ×1 IMPLANT
SUT PROLENE 0 CT (SUTURE) IMPLANT
SUT SILK 0 TIES 10X30 (SUTURE) ×1 IMPLANT
TOWEL GREEN STERILE (TOWEL DISPOSABLE) ×1 IMPLANT
TOWEL GREEN STERILE FF (TOWEL DISPOSABLE) ×1 IMPLANT
TRAY LAPAROSCOPIC MC (CUSTOM PROCEDURE TRAY) ×1 IMPLANT
TROCAR 11X100 Z THREAD (TROCAR) IMPLANT
TROCAR 5MMX150MM (TROCAR) ×1 IMPLANT
TROCAR XCEL NON-BLD 5MMX100MML (ENDOMECHANICALS) ×1 IMPLANT
TROCAR Z-THREAD OPTICAL 5X100M (TROCAR) ×1 IMPLANT
WATER STERILE IRR 1000ML POUR (IV SOLUTION) ×1 IMPLANT

## 2022-10-13 NOTE — Op Note (Signed)
DATE OF SERVICE: 10/13/2022  PATIENT:  Judy Chang  72 y.o. female  PRE-OPERATIVE DIAGNOSIS:  CKD V  POST-OPERATIVE DIAGNOSIS:  Same  PROCEDURE:   1) laparoscopic omentopexy 2) laparoscopic peritoneal dialysis catheter placement  SURGEON:  Surgeons and Role:    * Leonie Douglas, MD - Primary  ASSISTANT: none  ANESTHESIA:   general  EBL: minimal  BLOOD ADMINISTERED:none  DRAINS: none   LOCAL MEDICATIONS USED:  LIDOCAINE   SPECIMEN:  none  COUNTS: confirmed correct.  TOURNIQUET:  none  PATIENT DISPOSITION:  PACU - hemodynamically stable.   Delay start of Pharmacological VTE agent (>24hrs) due to surgical blood loss or risk of bleeding: no  INDICATION FOR PROCEDURE: KADEJA NEED is a 72 y.o. female with CKD 5. After careful discussion of risks, benefits, and alternatives the patient was offered laparoscopic peritoneal dialysis catheter placement.  The patient understood and wished to proceed.  OPERATIVE FINDINGS: successful omentopexy. Successful PD catheter placement. Catheter flushed and drained appropriately in OR.  DESCRIPTION OF PROCEDURE: After identification of the patient in the pre-operative holding area, the patient was transferred to the operating room. The patient was positioned supine on the operating room table. Anesthesia was induced. The abdomen was prepped and draped in standard fashion. A surgical pause was performed confirming correct patient, procedure, and operative location.  A Veress needle was introduced into the abdomen at Palmer's point, immediately below the left costal margin.  A saline drop test was used to confirm intra-abdominal position.  The Veress needle was connected to insufflation tubing and insufflation initiated.  A low opening pressure and good flow rate were noted.  A 5 mm trocar was introduced into the rightupper quadrant using Visi-View technique.  The obturator was removed and the abdomen inspected with a 30 degree angled  laparoscope.  No evidence of Veress needle injury was noted.  An additional 5 mm trocar was inserted a handsbreadth away to facilitate the case.  The omentum was grasped with an atraumatic grasper and elevated to the upper abdomen.  A laparoscopic suture passer was used to deliver a 0 Prolene suture through the omentum.  The suture was secured down to the anterior abdominal wall with a knot.  The omentum was pexied in place.  The abdomen was desufflated.  The peritoneal catheter was measured across the abdominal wall surface.  A mark was made by the cuff in the periumbilical abdomen.  The abdomen was reinsufflated.  An advantage 5 mm trocar was then used to create a skiving path through the anterior rectus fascia, rectus muscle, and peritoneum.  The laparoscopic trocar entered in the suprapubic abdomen directing towards the pelvis.  The working end of the catheter was delivered through the trocar.  The cuff was brought into the abdomen and then placed back into the rectus muscle.  The abdomen was desufflated again.  A "swan-neck" extension tubing for the dialysis catheter was brought onto the field.  The catheter was laid across the desufflated abdomen to lay across the upper quadrant and exit the left abdomen.  The catheter was cut.  The 2 pieces of catheter were then [facility] using a Christmas tree adapter.  This was secured in place with 2 interrupted sutures of 0 Prolene.  The connected catheter was then tunneled to a counterincision in the upper quadrant and then out the lateral abdomen in a downward deflection.  Great care was taken to avoid twisting or kinking the catheter.  The abdomen was reinsufflated.  The  peritoneum was inspected to ensure the catheter did not enter the cavity.  Satisfied we desufflated the abdomen.  The catheter was connected to cystoscopy tubing.  The catheter flushed and drained without any difficulty.  The trochars were removed.  All incisions were closed with interrupted 4-0  Monocryl sutures.  Dermabond was applied.  A sterile bandage was applied to the peritoneal dialysis catheter.    Upon completion of the case instrument and sharps counts were confirmed correct. The patient was transferred to the PACU in good condition. I was present for all portions of the procedure.  FOLLOW UP PLAN: Assuming a normal postoperative course, I will see the patient on an as needed basis for catheter related issues.   Rande Brunt. Lenell Antu, MD [facility] Phone Number: [phone] 10/13/2022 8:52 AM

## 2022-10-13 NOTE — Interval H&P Note (Signed)
History and Physical Interval Note:  10/13/2022 7:28 AM  Judy Chang  has presented today for surgery, with the diagnosis of Chronic kidney disease, stage V.  The various methods of treatment have been discussed with the patient and family. After consideration of risks, benefits and other options for treatment, the patient has consented to  Procedure(s): LAPAROSCOPIC INSERTION CONTINUOUS AMBULATORY PERITONEAL DIALYSIS  (CAPD) CATHETER (N/A) POSSIBLE LAPAROSCOPIC OMENTOPEXY (N/A) as a surgical intervention.  The patient's history has been reviewed, patient examined, no change in status, stable for surgery.  I have reviewed the patient's chart and labs.  Questions were answered to the patient's satisfaction.     Leonie Douglas

## 2022-10-13 NOTE — Anesthesia Postprocedure Evaluation (Signed)
Anesthesia Post Note  Patient: Judy Chang  Procedure(s) Performed: LAPAROSCOPIC INSERTION CONTINUOUS AMBULATORY PERITONEAL DIALYSIS  (CAPD) CATHETER (Abdomen) LAPAROSCOPIC OMENTOPEXY (Abdomen)     Patient location during evaluation: PACU Anesthesia Type: General Level of consciousness: awake and alert Pain management: pain level controlled Vital Signs Assessment: post-procedure vital signs reviewed and stable Respiratory status: spontaneous breathing, nonlabored ventilation, respiratory function stable and patient connected to nasal cannula oxygen Cardiovascular status: blood pressure returned to baseline and stable Postop Assessment: no apparent nausea or vomiting Anesthetic complications: no  No notable events documented.  Last Vitals:  Vitals:   10/13/22 0945 10/13/22 1000  BP: (!) 171/74 (!) 161/99  Pulse: 64 60  Resp: 20 16  Temp:  (!) 36.3 C  SpO2: 100% 94%    Last Pain:  Vitals:   10/13/22 1000  TempSrc:   PainSc: 4                  Jackie Littlejohn L Marijean Montanye

## 2022-10-13 NOTE — Anesthesia Procedure Notes (Signed)
Procedure Name: Intubation Date/Time: 10/13/2022 8:03 AM  Performed by: Thomasene Ripple, CRNAPre-anesthesia Checklist: Patient identified, Emergency Drugs available, Suction available and Patient being monitored Patient Re-evaluated:Patient Re-evaluated prior to induction Oxygen Delivery Method: Circle System Utilized Preoxygenation: Pre-oxygenation with 100% oxygen Induction Type: IV induction Ventilation: Mask ventilation without difficulty Laryngoscope Size: Miller and 2 Grade View: Grade I Tube type: Oral Tube size: 7.0 mm Number of attempts: 1 Airway Equipment and Method: Stylet and Oral airway Placement Confirmation: ETT inserted through vocal cords under direct vision, positive ETCO2 and breath sounds checked- equal and bilateral Tube secured with: Tape Dental Injury: Teeth and Oropharynx as per pre-operative assessment

## 2022-10-13 NOTE — Transfer of Care (Signed)
Immediate Anesthesia Transfer of Care Note  Patient: Judy Chang  Procedure(s) Performed: LAPAROSCOPIC INSERTION CONTINUOUS AMBULATORY PERITONEAL DIALYSIS  (CAPD) CATHETER (Abdomen) LAPAROSCOPIC OMENTOPEXY (Abdomen)  Patient Location: PACU  Anesthesia Type:General  Level of Consciousness: awake, alert , oriented, and patient cooperative  Airway & Oxygen Therapy: Patient Spontanous Breathing and Patient connected to face mask oxygen  Post-op Assessment: Report given to RN, Post -op Vital signs reviewed and stable, and Patient moving all extremities  Post vital signs: Reviewed and stable  Last Vitals:  Vitals Value Taken Time  BP 166/81 10/13/22 0915  Temp 36.3 C 10/13/22 0915  Pulse 60 10/13/22 0920  Resp 12 10/13/22 0920  SpO2 99 % 10/13/22 0920  Vitals shown include unfiled device data.  Last Pain:  Vitals:   10/13/22 0915  TempSrc:   PainSc: 0-No pain         Complications: No notable events documented.

## 2022-10-14 ENCOUNTER — Encounter (HOSPITAL_COMMUNITY): Payer: Self-pay | Admitting: Vascular Surgery

## 2022-10-20 DIAGNOSIS — N184 Chronic kidney disease, stage 4 (severe): Secondary | ICD-10-CM | POA: Diagnosis not present

## 2022-10-20 DIAGNOSIS — I129 Hypertensive chronic kidney disease with stage 1 through stage 4 chronic kidney disease, or unspecified chronic kidney disease: Secondary | ICD-10-CM | POA: Diagnosis not present

## 2022-10-20 DIAGNOSIS — N179 Acute kidney failure, unspecified: Secondary | ICD-10-CM | POA: Diagnosis not present

## 2022-10-20 DIAGNOSIS — S066X0D Traumatic subarachnoid hemorrhage without loss of consciousness, subsequent encounter: Secondary | ICD-10-CM | POA: Diagnosis not present

## 2022-10-20 DIAGNOSIS — D631 Anemia in chronic kidney disease: Secondary | ICD-10-CM | POA: Diagnosis not present

## 2022-10-26 DIAGNOSIS — N185 Chronic kidney disease, stage 5: Secondary | ICD-10-CM | POA: Diagnosis not present

## 2022-10-28 DIAGNOSIS — D631 Anemia in chronic kidney disease: Secondary | ICD-10-CM | POA: Diagnosis not present

## 2022-10-28 DIAGNOSIS — S066X0D Traumatic subarachnoid hemorrhage without loss of consciousness, subsequent encounter: Secondary | ICD-10-CM | POA: Diagnosis not present

## 2022-10-28 DIAGNOSIS — N184 Chronic kidney disease, stage 4 (severe): Secondary | ICD-10-CM | POA: Diagnosis not present

## 2022-10-28 DIAGNOSIS — I129 Hypertensive chronic kidney disease with stage 1 through stage 4 chronic kidney disease, or unspecified chronic kidney disease: Secondary | ICD-10-CM | POA: Diagnosis not present

## 2022-10-28 DIAGNOSIS — N179 Acute kidney failure, unspecified: Secondary | ICD-10-CM | POA: Diagnosis not present

## 2022-11-03 DIAGNOSIS — N179 Acute kidney failure, unspecified: Secondary | ICD-10-CM | POA: Diagnosis not present

## 2022-11-03 DIAGNOSIS — I129 Hypertensive chronic kidney disease with stage 1 through stage 4 chronic kidney disease, or unspecified chronic kidney disease: Secondary | ICD-10-CM | POA: Diagnosis not present

## 2022-11-03 DIAGNOSIS — S066X0D Traumatic subarachnoid hemorrhage without loss of consciousness, subsequent encounter: Secondary | ICD-10-CM | POA: Diagnosis not present

## 2022-11-03 DIAGNOSIS — D631 Anemia in chronic kidney disease: Secondary | ICD-10-CM | POA: Diagnosis not present

## 2022-11-03 DIAGNOSIS — N184 Chronic kidney disease, stage 4 (severe): Secondary | ICD-10-CM | POA: Diagnosis not present

## 2022-11-08 DIAGNOSIS — N186 End stage renal disease: Secondary | ICD-10-CM | POA: Diagnosis not present

## 2022-11-08 DIAGNOSIS — Z992 Dependence on renal dialysis: Secondary | ICD-10-CM | POA: Diagnosis not present

## 2022-11-08 DIAGNOSIS — N2581 Secondary hyperparathyroidism of renal origin: Secondary | ICD-10-CM | POA: Diagnosis not present

## 2022-11-09 DIAGNOSIS — N2581 Secondary hyperparathyroidism of renal origin: Secondary | ICD-10-CM | POA: Diagnosis not present

## 2022-11-09 DIAGNOSIS — Z992 Dependence on renal dialysis: Secondary | ICD-10-CM | POA: Diagnosis not present

## 2022-11-09 DIAGNOSIS — N186 End stage renal disease: Secondary | ICD-10-CM | POA: Diagnosis not present

## 2022-11-10 ENCOUNTER — Other Ambulatory Visit (HOSPITAL_COMMUNITY): Payer: Self-pay | Admitting: Nephrology

## 2022-11-10 DIAGNOSIS — N186 End stage renal disease: Secondary | ICD-10-CM

## 2022-11-10 DIAGNOSIS — N2581 Secondary hyperparathyroidism of renal origin: Secondary | ICD-10-CM | POA: Diagnosis not present

## 2022-11-10 DIAGNOSIS — Z992 Dependence on renal dialysis: Secondary | ICD-10-CM | POA: Diagnosis not present

## 2022-11-11 DIAGNOSIS — N186 End stage renal disease: Secondary | ICD-10-CM | POA: Diagnosis not present

## 2022-11-11 DIAGNOSIS — N2581 Secondary hyperparathyroidism of renal origin: Secondary | ICD-10-CM | POA: Diagnosis not present

## 2022-11-11 DIAGNOSIS — Z992 Dependence on renal dialysis: Secondary | ICD-10-CM | POA: Diagnosis not present

## 2022-11-17 ENCOUNTER — Encounter (HOSPITAL_COMMUNITY): Payer: Self-pay

## 2022-11-17 ENCOUNTER — Ambulatory Visit (HOSPITAL_COMMUNITY): Payer: Medicare Other

## 2022-11-17 ENCOUNTER — Telehealth (HOSPITAL_COMMUNITY): Payer: Self-pay | Admitting: *Deleted

## 2022-11-17 NOTE — Telephone Encounter (Signed)
Received fax from [facility] requesting STAT PD Catheter removal. Will  give to [facility].

## 2022-11-17 NOTE — Telephone Encounter (Signed)
Received 2nd fax request from [facility] for CVC placement. Will give to [facility]

## 2022-11-18 ENCOUNTER — Other Ambulatory Visit: Payer: Self-pay

## 2022-11-18 DIAGNOSIS — N185 Chronic kidney disease, stage 5: Secondary | ICD-10-CM

## 2022-11-22 ENCOUNTER — Encounter (HOSPITAL_COMMUNITY): Payer: Self-pay | Admitting: Vascular Surgery

## 2022-11-22 NOTE — Progress Notes (Addendum)
Judy Judy Chang denies chest pain or shortness of breath. Patient denies having any s/s of Covid in her household, also denies any known exposure to Covid. Judy. Chang denies  any s/s of upper or lower respiratory infection in the past 8 weeks.   Judy Chang's PCP is Dr. Liane Comber. Patient had  overnight oximetry study in April of 2024..   HS oxygen was increase from 1.5 to 2.5, does not use oxygen in the daytime.

## 2022-11-23 ENCOUNTER — Ambulatory Visit (HOSPITAL_BASED_OUTPATIENT_CLINIC_OR_DEPARTMENT_OTHER): Payer: Medicare Other | Admitting: Anesthesiology

## 2022-11-23 ENCOUNTER — Ambulatory Visit (HOSPITAL_COMMUNITY)
Admission: RE | Admit: 2022-11-23 | Discharge: 2022-11-23 | Disposition: A | Discharge status: Home / Self Care | Payer: Medicare Other | Attending: Vascular Surgery | Admitting: Vascular Surgery

## 2022-11-23 ENCOUNTER — Encounter (HOSPITAL_COMMUNITY)
Admission: RE | Disposition: A | Discharge status: Home / Self Care | Payer: Self-pay | Source: Home / Self Care | Attending: Vascular Surgery

## 2022-11-23 ENCOUNTER — Ambulatory Visit (HOSPITAL_COMMUNITY): Payer: Medicare Other

## 2022-11-23 ENCOUNTER — Ambulatory Visit (HOSPITAL_COMMUNITY): Payer: Medicare Other | Admitting: Anesthesiology

## 2022-11-23 ENCOUNTER — Encounter (HOSPITAL_COMMUNITY): Payer: Self-pay | Admitting: Vascular Surgery

## 2022-11-23 ENCOUNTER — Other Ambulatory Visit: Payer: Self-pay

## 2022-11-23 DIAGNOSIS — Z87891 Personal history of nicotine dependence: Secondary | ICD-10-CM | POA: Insufficient documentation

## 2022-11-23 DIAGNOSIS — N186 End stage renal disease: Secondary | ICD-10-CM | POA: Insufficient documentation

## 2022-11-23 DIAGNOSIS — Z4902 Encounter for fitting and adjustment of peritoneal dialysis catheter: Secondary | ICD-10-CM | POA: Diagnosis not present

## 2022-11-23 DIAGNOSIS — I120 Hypertensive chronic kidney disease with stage 5 chronic kidney disease or end stage renal disease: Secondary | ICD-10-CM | POA: Insufficient documentation

## 2022-11-23 DIAGNOSIS — I129 Hypertensive chronic kidney disease with stage 1 through stage 4 chronic kidney disease, or unspecified chronic kidney disease: Secondary | ICD-10-CM

## 2022-11-23 DIAGNOSIS — Z452 Encounter for adjustment and management of vascular access device: Secondary | ICD-10-CM | POA: Insufficient documentation

## 2022-11-23 DIAGNOSIS — Z4901 Encounter for fitting and adjustment of extracorporeal dialysis catheter: Secondary | ICD-10-CM | POA: Diagnosis not present

## 2022-11-23 DIAGNOSIS — N185 Chronic kidney disease, stage 5: Secondary | ICD-10-CM

## 2022-11-23 DIAGNOSIS — Z90710 Acquired absence of both cervix and uterus: Secondary | ICD-10-CM | POA: Diagnosis present

## 2022-11-23 HISTORY — DX: Cerebral infarction, unspecified: I63.9

## 2022-11-23 HISTORY — PX: CAPD REMOVAL: SHX5234

## 2022-11-23 HISTORY — PX: INSERTION OF DIALYSIS CATHETER: SHX1324

## 2022-11-23 LAB — POCT I-STAT, CHEM 8
BUN: 34 mg/dL — ABNORMAL HIGH (ref 8–23)
Calcium, Ion: 1.28 mmol/L (ref 1.15–1.40)
Chloride: 110 mmol/L (ref 98–111)
Creatinine, Ser: 4.7 mg/dL — ABNORMAL HIGH (ref 0.44–1.00)
Glucose, Bld: 82 mg/dL (ref 70–99)
HCT: 36 % (ref 36.0–46.0)
Hemoglobin: 12.2 g/dL (ref 12.0–15.0)
Potassium: 5.2 mmol/L — ABNORMAL HIGH (ref 3.5–5.1)
Sodium: 137 mmol/L (ref 135–145)
TCO2: 19 mmol/L — ABNORMAL LOW (ref 22–32)

## 2022-11-23 SURGERY — LAPAROSCOPIC REMOVAL CONTINUOUS AMBULATORY PERITONEAL DIALYSIS  (CAPD) CATHETER
Anesthesia: General | Site: Neck | Laterality: Right

## 2022-11-23 MED ORDER — PHENYLEPHRINE 80 MCG/ML (10ML) SYRINGE FOR IV PUSH (FOR BLOOD PRESSURE SUPPORT)
PREFILLED_SYRINGE | INTRAVENOUS | Status: AC
  Filled 2022-11-23: qty 10

## 2022-11-23 MED ORDER — FENTANYL CITRATE (PF) 250 MCG/5ML IJ SOLN
INTRAMUSCULAR | Status: DC | PRN
  Administered 2022-11-23: 50 ug via INTRAVENOUS

## 2022-11-23 MED ORDER — ONDANSETRON HCL 4 MG/2ML IJ SOLN
INTRAMUSCULAR | Status: DC | PRN
  Administered 2022-11-23: 4 mg via INTRAVENOUS

## 2022-11-23 MED ORDER — CHLORHEXIDINE GLUCONATE 0.12 % MT SOLN
OROMUCOSAL | Status: AC
  Administered 2022-11-23: 15 mL via OROMUCOSAL
  Filled 2022-11-23: qty 15

## 2022-11-23 MED ORDER — LACTATED RINGERS IV SOLN: INTRAVENOUS | Status: DC | PRN

## 2022-11-23 MED ORDER — ORAL CARE MOUTH RINSE: 15.0000 mL | Freq: Once | OROMUCOSAL | Status: AC

## 2022-11-23 MED ORDER — PHENYLEPHRINE 80 MCG/ML (10ML) SYRINGE FOR IV PUSH (FOR BLOOD PRESSURE SUPPORT)
PREFILLED_SYRINGE | INTRAVENOUS | Status: DC | PRN
  Administered 2022-11-23 (×2): 80 ug via INTRAVENOUS

## 2022-11-23 MED ORDER — HEPARIN SODIUM (PORCINE) 1000 UNIT/ML IJ SOLN
INTRAMUSCULAR | Status: DC | PRN
  Administered 2022-11-23: 1000 [IU] via INTRAVENOUS

## 2022-11-23 MED ORDER — HEPARIN 6000 UNIT IRRIGATION SOLUTION
Status: AC
  Filled 2022-11-23: qty 500

## 2022-11-23 MED ORDER — ACETAMINOPHEN 10 MG/ML IV SOLN
INTRAVENOUS | Status: AC
  Filled 2022-11-23: qty 100

## 2022-11-23 MED ORDER — CEFAZOLIN SODIUM-DEXTROSE 2-4 GM/100ML-% IV SOLN
2.0000 g | INTRAVENOUS | Status: AC
  Administered 2022-11-23: 2 g via INTRAVENOUS
  Filled 2022-11-23: qty 100

## 2022-11-23 MED ORDER — OXYCODONE HCL 5 MG/5ML PO SOLN: 5.0000 mg | Freq: Once | ORAL | Status: DC | PRN

## 2022-11-23 MED ORDER — HYDROMORPHONE HCL 1 MG/ML IJ SOLN
INTRAMUSCULAR | Status: AC
  Filled 2022-11-23: qty 1

## 2022-11-23 MED ORDER — ROCURONIUM BROMIDE 10 MG/ML (PF) SYRINGE
PREFILLED_SYRINGE | INTRAVENOUS | Status: AC
  Filled 2022-11-23: qty 10

## 2022-11-23 MED ORDER — HEPARIN 6000 UNIT IRRIGATION SOLUTION
Status: DC | PRN
  Administered 2022-11-23: 1

## 2022-11-23 MED ORDER — FENTANYL CITRATE (PF) 250 MCG/5ML IJ SOLN
INTRAMUSCULAR | Status: AC
  Filled 2022-11-23: qty 5

## 2022-11-23 MED ORDER — DEXAMETHASONE SODIUM PHOSPHATE 10 MG/ML IJ SOLN
INTRAMUSCULAR | Status: DC | PRN
  Administered 2022-11-23: 10 mg via INTRAVENOUS

## 2022-11-23 MED ORDER — HEPARIN SODIUM (PORCINE) 1000 UNIT/ML IJ SOLN
INTRAMUSCULAR | Status: AC
  Filled 2022-11-23: qty 20

## 2022-11-23 MED ORDER — CHLORHEXIDINE GLUCONATE 4 % EX SOLN: 60.0000 mL | Freq: Once | CUTANEOUS | Status: DC

## 2022-11-23 MED ORDER — MIDAZOLAM HCL 2 MG/2ML IJ SOLN
INTRAMUSCULAR | Status: DC | PRN
  Administered 2022-11-23 (×2): 1 mg via INTRAVENOUS

## 2022-11-23 MED ORDER — LIDOCAINE-EPINEPHRINE (PF) 1 %-1:200000 IJ SOLN
INTRAMUSCULAR | Status: AC
  Filled 2022-11-23: qty 30

## 2022-11-23 MED ORDER — ONDANSETRON HCL 4 MG/2ML IJ SOLN: 4.0000 mg | Freq: Once | INTRAMUSCULAR | Status: DC | PRN

## 2022-11-23 MED ORDER — AMISULPRIDE (ANTIEMETIC) 5 MG/2ML IV SOLN: 10.0000 mg | Freq: Once | INTRAVENOUS | Status: DC | PRN

## 2022-11-23 MED ORDER — DEXAMETHASONE SODIUM PHOSPHATE 10 MG/ML IJ SOLN
INTRAMUSCULAR | Status: AC
  Filled 2022-11-23: qty 1

## 2022-11-23 MED ORDER — PROPOFOL 10 MG/ML IV BOLUS
INTRAVENOUS | Status: DC | PRN
  Administered 2022-11-23: 90 mg via INTRAVENOUS

## 2022-11-23 MED ORDER — ROCURONIUM BROMIDE 10 MG/ML (PF) SYRINGE
PREFILLED_SYRINGE | INTRAVENOUS | Status: DC | PRN
  Administered 2022-11-23: 30 mg via INTRAVENOUS

## 2022-11-23 MED ORDER — OXYCODONE HCL 5 MG PO TABS
5.0000 mg | ORAL_TABLET | Freq: Once | ORAL | Status: DC | PRN
Start: 2022-11-23 — End: 2022-11-23

## 2022-11-23 MED ORDER — HEPARIN SODIUM (PORCINE) 1000 UNIT/ML IJ SOLN
INTRAMUSCULAR | Status: AC
  Filled 2022-11-23: qty 10

## 2022-11-23 MED ORDER — OXYCODONE HCL 20 MG PO TABS: 30.0000 mg | ORAL_TABLET | Freq: Four times a day (QID) | ORAL | 0 refills | Status: DC | PRN

## 2022-11-23 MED ORDER — HYDROMORPHONE HCL 1 MG/ML IJ SOLN
0.2500 mg | INTRAMUSCULAR | Status: DC | PRN
  Administered 2022-11-23: 0.25 mg via INTRAVENOUS
  Administered 2022-11-23: 0.5 mg via INTRAVENOUS
  Administered 2022-11-23: 0.25 mg via INTRAVENOUS
  Administered 2022-11-23: 0.5 mg via INTRAVENOUS

## 2022-11-23 MED ORDER — 0.9 % SODIUM CHLORIDE (POUR BTL) OPTIME
TOPICAL | Status: DC | PRN
  Administered 2022-11-23: 1000 mL

## 2022-11-23 MED ORDER — MIDAZOLAM HCL 2 MG/2ML IJ SOLN
INTRAMUSCULAR | Status: AC
  Filled 2022-11-23: qty 2

## 2022-11-23 MED ORDER — SODIUM CHLORIDE 0.9 % IV SOLN: INTRAVENOUS | Status: DC | PRN

## 2022-11-23 MED ORDER — CHLORHEXIDINE GLUCONATE 0.12 % MT SOLN
15.0000 mL | Freq: Once | OROMUCOSAL | Status: AC
Start: 2022-11-23 — End: 2022-11-23

## 2022-11-23 MED ORDER — LIDOCAINE 2% (20 MG/ML) 5 ML SYRINGE
INTRAMUSCULAR | Status: AC
  Filled 2022-11-23: qty 5

## 2022-11-23 MED ORDER — ACETAMINOPHEN 10 MG/ML IV SOLN
1000.0000 mg | Freq: Once | INTRAVENOUS | Status: DC | PRN
  Administered 2022-11-23: 1000 mg via INTRAVENOUS

## 2022-11-23 MED ORDER — ONDANSETRON HCL 4 MG/2ML IJ SOLN
INTRAMUSCULAR | Status: AC
  Filled 2022-11-23: qty 2

## 2022-11-23 MED ORDER — PROPOFOL 10 MG/ML IV BOLUS
INTRAVENOUS | Status: AC
  Filled 2022-11-23: qty 20

## 2022-11-23 MED ORDER — SUGAMMADEX SODIUM 200 MG/2ML IV SOLN
INTRAVENOUS | Status: DC | PRN
  Administered 2022-11-23: 200 mg via INTRAVENOUS

## 2022-11-23 MED ORDER — LIDOCAINE 2% (20 MG/ML) 5 ML SYRINGE
INTRAMUSCULAR | Status: DC | PRN
  Administered 2022-11-23: 100 mg via INTRAVENOUS

## 2022-11-23 SURGICAL SUPPLY — 44 items
ADH SKN CLS APL DERMABOND .7 (GAUZE/BANDAGES/DRESSINGS) ×2
APL SKNCLS STERI-STRIP NONHPOA (GAUZE/BANDAGES/DRESSINGS) ×2
BAG DECANTER FOR FLEXI CONT (MISCELLANEOUS) ×2 IMPLANT
BENZOIN TINCTURE PRP APPL 2/3 (GAUZE/BANDAGES/DRESSINGS) ×2 IMPLANT
BIOPATCH RED 1 DISK 7.0 (GAUZE/BANDAGES/DRESSINGS) ×2 IMPLANT
CATH PALINDROME-P 19CM W/VT (CATHETERS) IMPLANT
CATH PALINDROME-P 23CM W/VT (CATHETERS) IMPLANT
CATH PALINDROME-P 28CM W/VT (CATHETERS) IMPLANT
COVER PROBE W GEL 5X96 (DRAPES) ×2 IMPLANT
COVER SURGICAL LIGHT HANDLE (MISCELLANEOUS) ×2 IMPLANT
DERMABOND ADVANCED .7 DNX12 (GAUZE/BANDAGES/DRESSINGS) IMPLANT
DRAPE C-ARM 42X72 X-RAY (DRAPES) ×2 IMPLANT
DRAPE CHEST BREAST 15X10 FENES (DRAPES) ×2 IMPLANT
DRSG TEGADERM 4X10 (GAUZE/BANDAGES/DRESSINGS) IMPLANT
GAUZE 4X4 16PLY ~~LOC~~+RFID DBL (SPONGE) ×2 IMPLANT
GAUZE SPONGE 4X4 12PLY STRL (GAUZE/BANDAGES/DRESSINGS) ×2 IMPLANT
GLOVE BIO SURGEON STRL SZ8 (GLOVE) ×2 IMPLANT
GOWN STRL REUS W/ TWL LRG LVL3 (GOWN DISPOSABLE) ×2 IMPLANT
GOWN STRL REUS W/ TWL XL LVL3 (GOWN DISPOSABLE) ×2 IMPLANT
GOWN STRL REUS W/TWL LRG LVL3 (GOWN DISPOSABLE) ×2
GOWN STRL REUS W/TWL XL LVL3 (GOWN DISPOSABLE) ×2
KIT BASIN OR (CUSTOM PROCEDURE TRAY) ×2 IMPLANT
KIT PALINDROME-P 55CM (CATHETERS) IMPLANT
KIT TURNOVER KIT B (KITS) ×2 IMPLANT
NDL 18GX1X1/2 (RX/OR ONLY) (NEEDLE) ×2 IMPLANT
NDL HYPO 25GX1X1/2 BEV (NEEDLE) ×2 IMPLANT
NEEDLE 18GX1X1/2 (RX/OR ONLY) (NEEDLE) ×2 IMPLANT
NEEDLE HYPO 25GX1X1/2 BEV (NEEDLE) ×2 IMPLANT
NS IRRIG 1000ML POUR BTL (IV SOLUTION) ×2 IMPLANT
PACK BASIC III (CUSTOM PROCEDURE TRAY) ×2
PACK SRG BSC III STRL LF ECLPS (CUSTOM PROCEDURE TRAY) ×2 IMPLANT
PAD ARMBOARD 7.5X6 YLW CONV (MISCELLANEOUS) ×4 IMPLANT
SET MICROPUNCTURE 5F STIFF (MISCELLANEOUS) ×2 IMPLANT
SOAP 2 % CHG 4 OZ (WOUND CARE) ×2 IMPLANT
STRIP CLOSURE SKIN 1/2X4 (GAUZE/BANDAGES/DRESSINGS) ×2 IMPLANT
SUT ETHILON 3 0 PS 1 (SUTURE) ×2 IMPLANT
SUT MNCRL AB 4-0 PS2 18 (SUTURE) ×2 IMPLANT
SYR 10ML LL (SYRINGE) ×2 IMPLANT
SYR 20ML LL LF (SYRINGE) ×4 IMPLANT
SYR 5ML LL (SYRINGE) ×2 IMPLANT
SYR CONTROL 10ML LL (SYRINGE) ×2 IMPLANT
TOWEL GREEN STERILE (TOWEL DISPOSABLE) ×2 IMPLANT
TOWEL GREEN STERILE FF (TOWEL DISPOSABLE) ×4 IMPLANT
WATER STERILE IRR 1000ML POUR (IV SOLUTION) ×2 IMPLANT

## 2022-11-23 NOTE — Op Note (Signed)
DATE OF SERVICE: 11/23/2022  PATIENT:  Judy Chang  72 y.o. female  PRE-OPERATIVE DIAGNOSIS:  ESRD  POST-OPERATIVE DIAGNOSIS:  Same  PROCEDURE:   1) placement of right internal jugular tunneled dialysis catheter using ultrasound and fluoroscopic guidance 2) removal of peritoneal dialysis catheter  SURGEON:  Surgeons and Role:    * Leonie Douglas, MD - Primary  ASSISTANT: Paul Dykes, RNFA  ANESTHESIA:   general  EBL: minimal  BLOOD ADMINISTERED:none  DRAINS: none   LOCAL MEDICATIONS USED:  NONE  SPECIMEN:  none  COUNTS: confirmed correct.  TOURNIQUET:  none  PATIENT DISPOSITION:  PACU - hemodynamically stable.   Delay start of Pharmacological VTE agent (>24hrs) due to surgical blood loss or risk of bleeding: no  INDICATION FOR PROCEDURE: [name] is a 72 y.o. female with ESRD. I placed a PD catheter for her, but she was not able to make this modality work for her at home. After careful discussion of risks, benefits, and alternatives the patient was offered PD catheter removal and TDC placement.  The patient understood and wished to proceed.  OPERATIVE FINDINGS: TDC placed RIJ. PD catheter removed intact.  DESCRIPTION OF PROCEDURE: After identification of the patient in the pre-operative holding area, the patient was transferred to the operating room. The patient was positioned supine on the operating room table. Anesthesia was induced. The neck and abdomen were prepped and draped in standard fashion. A surgical pause was performed confirming correct patient, procedure, and operative location.  Using ultrasound guidance the right internal jugular vein was accessed with micropuncture technique.  Through the micropuncture sheath a floppy J-wire was advanced into the superior vena cava.  A small incision was made around the skin access point.  The access point was serially dilated under direct fluoroscopic guidance.  A peel-away sheath was introduced into  the superior vena cava under fluoroscopic guidance.  A counterincision was made in the chest under the clavicle.  A 19 cm tunnel dialysis catheter was then tunneled under the skin, over the clavicle into the incision in the neck.  The tunneling device was removed and the catheter fed through the peel-away sheath into the superior vena cava.  The peel-away sheath was removed and the catheter gently pulled back.  Adequate position was confirmed with x-ray.  The catheter was tested and found to flush and draw back well.  Catheter was heparin locked.  Caps were applied.  Catheter was sutured to the skin.  The neck incision was closed with 4-0 Monocryl.  The course of the peritoneal dialysis catheter was marked on the skin using intraoperative ultrasound.  2 transverse incisions were made to free the cuffs in the abdominal wall.  The cuff and the rectus fascia was removed first and the working end of the catheter delivered onto the abdomen.  This was found to be intact.  The 2 swan-neck cuffs were then freed using Bovie electrocautery.  The catheter was removed intact from the abdominal wall.  The wounds were closed in layers using 3-0 Vicryl and 4-0 Monocryl.  Upon completion of the case instrument and sharps counts were confirmed correct. The patient was transferred to the PACU in good condition. I was present for all portions of the procedure.  FOLLOW UP PLAN: Assuming a normal postoperative course, I will see the patient in 4 weeks with vein mapping.   Judy Chang. Judy Antu, MD [facility] Phone Number: [phone] 11/23/2022 11:18 AM

## 2022-11-23 NOTE — Transfer of Care (Signed)
Immediate Anesthesia Transfer of Care Note  Patient: MAURENE MCNALLEY  Procedure(s) Performed: LAPAROSCOPIC REMOVAL CONTINUOUS AMBULATORY PERITONEAL DIALYSIS  (CAPD) CATHETER INSERTION OF TUNNELED DIALYSIS CATHETER (Right: Neck)  Patient Location: PACU  Anesthesia Type:General  Level of Consciousness: awake, alert , and oriented  Airway & Oxygen Therapy: Patient Spontanous Breathing and Patient connected to face mask oxygen  Post-op Assessment: Report given to RN and Post -op Vital signs reviewed and stable  Post vital signs: Reviewed and stable  Last Vitals:  Vitals Value Taken Time  BP 139/92 11/23/22 1130  Temp    Pulse 64 11/23/22 1133  Resp 19 11/23/22 1133  SpO2 98 % 11/23/22 1133  Vitals shown include unfiled device data.  Last Pain:  Vitals:   11/23/22 0737  TempSrc:   PainSc: 7       Patients Stated Pain Goal: 0 (11/23/22 0737)  Complications: No notable events documented.

## 2022-11-23 NOTE — Anesthesia Postprocedure Evaluation (Signed)
Anesthesia Post Note  Patient: Judy Chang  Procedure(s) Performed: LAPAROSCOPIC REMOVAL CONTINUOUS AMBULATORY PERITONEAL DIALYSIS  (CAPD) CATHETER INSERTION OF TUNNELED DIALYSIS CATHETER (Right: Neck)     Patient location during evaluation: PACU Anesthesia Type: General Level of consciousness: awake and alert Pain management: pain level controlled Vital Signs Assessment: post-procedure vital signs reviewed and stable Respiratory status: spontaneous breathing, nonlabored ventilation, respiratory function stable and patient connected to nasal cannula oxygen Cardiovascular status: blood pressure returned to baseline and stable Postop Assessment: no apparent nausea or vomiting Anesthetic complications: no   No notable events documented.  Last Vitals:  Vitals:   11/23/22 1230 11/23/22 1245  BP: (!) 159/67 (!) 166/64  Pulse: 70 71  Resp: 13 16  Temp:  36.6 C  SpO2: 95% 96%    Last Pain:  Vitals:   11/23/22 1215  TempSrc:   PainSc: 7                  Trevor Iha

## 2022-11-23 NOTE — H&P (Signed)
[facility] [place]  ASSESSMENT / PLAN: Judy Chang is a 72 y.o. with end-stage renal disease.  She has not been unable to perform peritoneal dialysis at home or with her husband's assistance.  She needs transition to hemodialysis.  Will plan to remove the peritoneal dialysis catheter in place a tunneled dialysis catheter today in the operating room.  All questions answered.  CHIEF COMPLAINT: in need of dialysis access  HISTORY OF PRESENT ILLNESS: Judy Chang is a 72 y.o. female referred to clinic for advanced chronic disease.  The patient dialysis.  The patient has never had dialysis access before.  The patient desires peritoneal dialysis.  Patient has had a hysterectomy, but no other abdominal surgery.  11/23/22: Patient returns to [facility] operating room for removal of her peritoneal dialysis catheter.  Her husband has not been able to assist her at home, as they had hoped.  She desires transition to hemodialysis.  Will plan to place a tunneled dialysis catheter today after removing the peritoneal dialysis catheter.  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   pt unaware   Anxiety    Arthritis    Avascular necrosis (HCC)    Chronic left femoral head   Benign essential HTN 10/23/2015   Chondromalacia 02/25/2018   Noted on MRI: Mild   DDD (degenerative disc disease), lumbar 01/10/2011   Noted on MRI   Dementia [facility])    patient denies   Depression    DJD (degenerative joint disease)    GERD (gastroesophageal reflux disease)    History of cataract    Bilateral   Hypercholesterolemia    Hypertension    Labial tear 02/25/2018   Noted on MRI: smal Left anterior    Lumbar spondylosis 01/10/2011   Noted on MRI   Migraines    Renal cyst 11/17/2015   Notedon [place] Abd: Simple, Right   Seizures (HCC)    Spinal stenosis 09/06/2016   Noted on MRI: Moderate-Severe   Stroke (HCC)    Vitreous floater, bilateral      Past Surgical History:  Procedure Laterality Date   CAPD INSERTION N/A 10/13/2022   Procedure: LAPAROSCOPIC INSERTION CONTINUOUS AMBULATORY PERITONEAL DIALYSIS  (CAPD) CATHETER;  Surgeon: Leonie Douglas, MD;  Location: [facility] OR;  Service: Vascular;  Laterality: N/A;   COLONOSCOPY     PARTIAL HYSTERECTOMY     ROTATOR CUFF REPAIR Left    TONSILLECTOMY     TOTAL HIP ARTHROPLASTY Left 06/22/2018   Procedure: TOTAL HIP ARTHROPLASTY ANTERIOR APPROACH;  Surgeon: Jodi Geralds, MD;  Location: [facility] ORS;  Service: Orthopedics;  Laterality: Left;    Family History  Problem Relation Age of Onset   Hypertension Mother    Hypertension Father     Social History   Socioeconomic History   Marital status: Married    Spouse name: Not on file   Number of children: 0   Years of education: Not on file   Highest education level: High school graduate  Occupational History   Not on file  Tobacco Use   Smoking status: Former    Types: Cigarettes   Smokeless tobacco: Never   Tobacco comments:    off and on  asmoked for many years, stopped ~2021  Vaping Use   Vaping status: Never Used  Substance and Sexual Activity   Alcohol use: Not Currently    Comment: every few days, beer   Drug use: Not Currently  Types: Marijuana    Comment: last time 06/13/2018   Sexual activity: Not on file  Other Topics Concern   Not on file  Social History Narrative   Lives with husband   Right handed   Caffeine: decaf coffee everyday, 2-3 cups   Social Determinants of Health   Financial Resource Strain: Not on file  Food Insecurity: No Food Insecurity (09/17/2022)   Hunger Vital Sign    Worried About Running Out of Food in the Last Year: Never true    Ran Out of Food in the Last Year: Never true  Transportation Needs: No Transportation Needs (09/17/2022)   PRAPARE - Administrator, Civil Service (Medical): No    Lack of Transportation (Non-Medical): No  Physical Activity: Not on file  Stress:  Not on file  Social Connections: Not on file  Intimate Partner Violence: Not At Risk (09/17/2022)   Humiliation, Afraid, Rape, and Kick questionnaire    Fear of Current or Ex-Partner: No    Emotionally Abused: No    Physically Abused: No    Sexually Abused: No    Allergies  Allergen Reactions   Citalopram Hydrobromide Hives and Rash   Nsaids Hives and Itching    Burning of the tongue    Pregabalin Itching    Current Facility-Administered Medications  Medication Dose Route Frequency Provider Last Rate Last Admin   ceFAZolin (ANCEF) IVPB 2g/100 mL premix  2 g Intravenous 30 min Pre-Op Leonie Douglas, MD       chlorhexidine (HIBICLENS) 4 % liquid 4 Application  60 mL Topical Once Leonie Douglas, MD       And   [START ON 11/24/2022] chlorhexidine (HIBICLENS) 4 % liquid 4 Application  60 mL Topical Once Leonie Douglas, MD        PHYSICAL EXAM Vitals:   11/23/22 0639  BP: (!) 150/73  Pulse: (!) 53  Resp: 18  Temp: 98.4 F (36.9 C)  TempSrc: Oral  SpO2: 98%  Weight: 47.6 kg  Height: 5\' 1"  (1.549 m)    Elderly thin woman in no distress Regular rate and rhythm Unlabored breathing Nondistended abdomen  PERTINENT LABORATORY AND RADIOLOGIC DATA  Most recent CBC    Latest Ref Rng & Units 11/23/2022    8:08 AM 10/13/2022    6:54 AM 09/17/2022   12:48 PM  CBC  Hemoglobin 12.0 - 15.0 g/dL 16.1  9.5  8.5   Hematocrit 36.0 - 46.0 % 36.0  28.0  27.7      Most recent CMP    Latest Ref Rng & Units 11/23/2022    8:08 AM 10/13/2022    6:54 AM 09/17/2022    6:28 AM  CMP  Glucose 70 - 99 mg/dL 82  76  79   BUN 8 - 23 mg/dL 34  35  28   Creatinine 0.44 - 1.00 mg/dL 0.96  0.45  4.09   Sodium 135 - 145 mmol/L 137  140  134   Potassium 3.5 - 5.1 mmol/L 5.2  5.4  4.8   Chloride 98 - 111 mmol/L 110  114  99   CO2 22 - 32 mmol/L   21   Calcium 8.9 - 10.3 mg/dL   9.3   Total Protein 6.5 - 8.1 g/dL   7.4   Total Bilirubin 0.3 - 1.2 mg/dL   0.7   Alkaline Phos 38 - 126 U/L    85   AST 15 - 41 U/L  17   ALT 0 - 44 U/L   10     Renal function Estimated Creatinine Clearance: 7.9 mL/min (A) (by C-G formula based on SCr of 4.7 mg/dL (H)).  Hgb A1c MFr Bld (%)  Date Value  02/08/2019 5.2    LDL Cholesterol  Date Value Ref Range Status  10/22/2015 93 0 - 99 mg/dL Final    Comment:           Total Cholesterol/HDL:CHD Risk Coronary Heart Disease Risk Table                     Men   Women  1/2 Average Risk   3.4   3.3  Average Risk       5.0   4.4  2 X Average Risk   9.6   7.1  3 X Average Risk  23.4   11.0        Use the calculated Patient Ratio above and the CHD Risk Table to determine the patient's CHD Risk.        ATP III CLASSIFICATION (LDL):  <100     mg/dL   Optimal  086-578  mg/dL   Near or Above                    Optimal  130-159  mg/dL   Borderline  469-629  mg/dL   High  >528     mg/dL   Very High     Satine Hausner N. Lenell Antu, MD FACS [facility] Phone Number: [phone] 11/23/2022 9:23 AM   Total time spent on preparing this encounter including chart review, data review, collecting history, examining the patient, coordinating care for this established patient, 30 minutes  Portions of this report may have been transcribed using voice recognition software.  Every effort has been made to ensure accuracy; however, inadvertent computerized transcription errors may still be present.

## 2022-11-23 NOTE — Anesthesia Procedure Notes (Signed)
Procedure Name: Intubation Date/Time: 11/23/2022 10:18 AM  Performed by: Georgianne Fick D, CRNAPre-anesthesia Checklist: Patient identified, Emergency Drugs available, Suction available and Patient being monitored Patient Re-evaluated:Patient Re-evaluated prior to induction Oxygen Delivery Method: Circle System Utilized Preoxygenation: Pre-oxygenation with 100% oxygen Induction Type: IV induction Ventilation: Mask ventilation without difficulty Laryngoscope Size: Miller and 2 Grade View: Grade I Tube type: Oral Number of attempts: 1 Airway Equipment and Method: Stylet and Oral airway Placement Confirmation: ETT inserted through vocal cords under direct vision, positive ETCO2 and breath sounds checked- equal and bilateral Secured at: 22 cm Tube secured with: Tape Dental Injury: Teeth and Oropharynx as per pre-operative assessment

## 2022-11-23 NOTE — Anesthesia Preprocedure Evaluation (Signed)
Anesthesia Evaluation  Patient identified by MRN, date of birth, ID band Patient awake    Reviewed: Allergy & Precautions, NPO status , Patient's Chart, lab work & pertinent test results, reviewed documented beta blocker date and time   Airway Mallampati: III  TM Distance: >3 FB Neck ROM: Full    Dental  (+) Edentulous Upper, Dental Advisory Given, Chipped, Missing, Poor Dentition,    Pulmonary neg pulmonary ROS, former smoker   Pulmonary exam normal breath sounds clear to auscultation       Cardiovascular hypertension, Pt. on medications and Pt. on home beta blockers Normal cardiovascular exam Rhythm:Regular Rate:Normal  TTE 2023 1. Left ventricular ejection fraction, by estimation, is 60 to 65%. The  left ventricle has normal function. The left ventricle has no regional  wall motion abnormalities. Left ventricular diastolic parameters are  consistent with Grade I diastolic  dysfunction (impaired relaxation).   2. Right ventricular systolic function is normal. The right ventricular  size is normal. There is moderately elevated pulmonary artery systolic  pressure. The estimated right ventricular systolic pressure is 56.2 mmHg.   3. The mitral valve is normal in structure. No evidence of mitral valve  regurgitation. No evidence of mitral stenosis.   4. The aortic valve is tricuspid. Aortic valve regurgitation is not  visualized. No aortic stenosis is present.   5. The inferior vena cava is normal in size with <50% respiratory  variability, suggesting right atrial pressure of 8 mmHg.     Neuro/Psych  Headaches, Seizures -,  PSYCHIATRIC DISORDERS Anxiety Depression   Dementia    GI/Hepatic Neg liver ROS,GERD  ,,  Endo/Other  negative endocrine ROS    Renal/GU CRFRenal diseaseCKD stage V Lab Results      Component                Value               Date                                   K                        5.2 (H)              11/23/2022                        BUN                      34 (H)              11/23/2022                CREATININE               4.70 (H)            11/23/2022                         negative genitourinary   Musculoskeletal  (+) Arthritis ,    Abdominal   Peds  Hematology  (+) Blood dyscrasia, anemia Lab Results      Component                Value               Date  HGB                      12.2                11/23/2022                HCT                      36.0                11/23/2022                Anesthesia Other Findings   Reproductive/Obstetrics                              Anesthesia Physical Anesthesia Plan  ASA: 3  Anesthesia Plan: General   Post-op Pain Management: Tylenol PO (pre-op)*   Induction: Intravenous  PONV Risk Score and Plan: 3 and Dexamethasone, Ondansetron and Treatment may vary due to age or medical condition  Airway Management Planned: Oral ETT  Additional Equipment:   Intra-op Plan:   Post-operative Plan: Extubation in OR  Informed Consent: I have reviewed the patients History and Physical, chart, labs and discussed the procedure including the risks, benefits and alternatives for the proposed anesthesia with the patient or authorized representative who has indicated his/her understanding and acceptance.     Dental advisory given  Plan Discussed with: CRNA  Anesthesia Plan Comments: (PAT note written 10/12/2022 by Shonna Chock, PA-C.  )        Anesthesia Quick Evaluation

## 2022-11-24 ENCOUNTER — Encounter (HOSPITAL_COMMUNITY): Payer: Self-pay | Admitting: Vascular Surgery

## 2022-11-24 DIAGNOSIS — E1022 Type 1 diabetes mellitus with diabetic chronic kidney disease: Secondary | ICD-10-CM | POA: Diagnosis not present

## 2022-11-24 DIAGNOSIS — N186 End stage renal disease: Secondary | ICD-10-CM | POA: Diagnosis not present

## 2022-11-24 DIAGNOSIS — Z992 Dependence on renal dialysis: Secondary | ICD-10-CM | POA: Diagnosis not present

## 2022-12-05 ENCOUNTER — Emergency Department (HOSPITAL_COMMUNITY)
Admission: EM | Admit: 2022-12-05 | Discharge: 2022-12-05 | Disposition: A | Discharge status: Home / Self Care | Payer: Medicare Other | Attending: Emergency Medicine | Admitting: Emergency Medicine

## 2022-12-05 ENCOUNTER — Encounter (HOSPITAL_COMMUNITY): Payer: Self-pay | Admitting: Emergency Medicine

## 2022-12-05 ENCOUNTER — Other Ambulatory Visit: Payer: Self-pay

## 2022-12-05 DIAGNOSIS — E875 Hyperkalemia: Secondary | ICD-10-CM | POA: Diagnosis not present

## 2022-12-05 DIAGNOSIS — Z992 Dependence on renal dialysis: Secondary | ICD-10-CM | POA: Insufficient documentation

## 2022-12-05 DIAGNOSIS — R7989 Other specified abnormal findings of blood chemistry: Secondary | ICD-10-CM | POA: Diagnosis present

## 2022-12-05 DIAGNOSIS — N186 End stage renal disease: Secondary | ICD-10-CM | POA: Diagnosis not present

## 2022-12-05 LAB — CBC
HCT: 26.4 % — ABNORMAL LOW (ref 36.0–46.0)
Hemoglobin: 8 g/dL — ABNORMAL LOW (ref 12.0–15.0)
MCH: 26.6 pg (ref 26.0–34.0)
MCHC: 30.3 g/dL (ref 30.0–36.0)
MCV: 87.7 fL (ref 80.0–100.0)
Platelets: 313 10*3/uL (ref 150–400)
RBC: 3.01 MIL/uL — ABNORMAL LOW (ref 3.87–5.11)
RDW: 19.9 % — ABNORMAL HIGH (ref 11.5–15.5)
WBC: 8.1 10*3/uL (ref 4.0–10.5)
nRBC: 0 % (ref 0.0–0.2)

## 2022-12-05 LAB — BASIC METABOLIC PANEL
Anion gap: 11 (ref 5–15)
BUN: 34 mg/dL — ABNORMAL HIGH (ref 8–23)
CO2: 17 mmol/L — ABNORMAL LOW (ref 22–32)
Calcium: 8.9 mg/dL (ref 8.9–10.3)
Chloride: 106 mmol/L (ref 98–111)
Creatinine, Ser: 4.79 mg/dL — ABNORMAL HIGH (ref 0.44–1.00)
GFR, Estimated: 9 mL/min — ABNORMAL LOW (ref >60)
Glucose, Bld: 95 mg/dL (ref 70–99)
Potassium: 6.3 mmol/L (ref 3.5–5.1)
Sodium: 134 mmol/L — ABNORMAL LOW (ref 135–145)

## 2022-12-05 MED ORDER — SODIUM ZIRCONIUM CYCLOSILICATE 10 G PO PACK
10.0000 g | PACK | Freq: Once | ORAL | Status: AC
  Administered 2022-12-05: 10 g via ORAL
  Filled 2022-12-05: qty 1

## 2022-12-05 NOTE — ED Triage Notes (Signed)
Pt here to have labs checked, states she was sent by her dialysis center to have labs checked so that she can have dialysis completed tomorrow.

## 2022-12-05 NOTE — ED Provider Notes (Signed)
[place] EMERGENCY DEPARTMENT AT [facility] Provider Note   CSN: [id number] Arrival date & time: 12/05/22  1550     History  Chief Complaint  Patient presents with   Abnormal Lab    Judy Chang is a 72 y.o. female who presents emergency department for lab screening.  Patient has a history of end-stage renal disease and has been on peritoneal dialysis.  She has her first hemodialysis session tomorrow and was told she needed to get labs prior to coming in.  She denies chest pain, shortness of breath, swelling.   Abnormal Lab      Home Medications Prior to Admission medications   Medication Sig Start Date End Date Taking? Authorizing Provider  amLODipine (NORVASC) 10 MG tablet Take 1 tablet (10 mg total) by mouth daily. 09/18/22   Joseph Art, DO  buPROPion (WELLBUTRIN XL) 150 MG 24 hr tablet Take 1 tablet (150 mg total) by mouth every morning. 09/28/22   Joseph Art, DO  carvedilol (COREG) 12.5 MG tablet Take 1 tablet (12.5 mg total) by mouth 2 (two) times daily with a meal. 02/17/19   Meredeth Ide, MD  cloNIDine (CATAPRES - DOSED IN MG/24 HR) 0.2 mg/24hr patch Place 1 patch (0.2 mg total) onto the skin once a week. 02/23/19   Meredeth Ide, MD  DULoxetine (CYMBALTA) 30 MG capsule Take 90 mg by mouth daily. 03/12/21   [provider]  gentamicin cream (GARAMYCIN) 0.1 % Apply 1 Application topically once a week. 11/04/22   [provider]  lactulose (CHRONULAC) 10 GM/15ML solution Take 10 g by mouth daily as needed for mild constipation or moderate constipation. 11/04/22   [provider]  levETIRAcetam (KEPPRA) 500 MG tablet Take 1 tablet (500 mg total) by mouth every 12 (twelve) hours. 02/17/19   Meredeth Ide, MD  Oxycodone HCl 20 MG TABS Take 1.5 tablets (30 mg total) by mouth every 6 (six) hours as needed. 11/23/22   Emilie Rutter, PA-C  spironolactone (ALDACTONE) 25 MG tablet Take 25 mg by mouth daily. 09/22/22   [provider]  traZODone (DESYREL) 100 MG tablet Take 100 mg by mouth at bedtime. 03/12/21   [provider]      Allergies    Citalopram hydrobromide, Nsaids, and Pregabalin    Review of Systems   Review of Systems  Physical Exam Updated Vital Signs BP (!) 155/65 (BP Location: Right Arm)   Pulse 62   Temp 98.4 F (36.9 C)   Resp 19   Ht 5\' 1"  (1.549 m)   Wt 45.8 kg   SpO2 97%   BMI 19.08 kg/m  Physical Exam Vitals and nursing note reviewed.  Constitutional:      General: She is not in acute distress.    Appearance: She is well-developed. She is not diaphoretic.  HENT:     Head: Normocephalic and atraumatic.     Right Ear: External ear normal.     Left Ear: External ear normal.     Nose: Nose normal.     Mouth/Throat:     Mouth: Mucous membranes are moist.  Eyes:     General: No scleral icterus.    Conjunctiva/sclera: Conjunctivae normal.  Cardiovascular:     Rate and Rhythm: Normal rate and regular rhythm.     Heart sounds: Normal heart sounds. No murmur heard.    No friction rub. No gallop.  Pulmonary:     Effort: Pulmonary effort is normal.  No respiratory distress.     Breath sounds: Normal breath sounds.  Chest:    Abdominal:     General: Bowel sounds are normal. There is no distension.     Palpations: Abdomen is soft. There is no mass.     Tenderness: There is no abdominal tenderness. There is no guarding.  Musculoskeletal:     Cervical back: Normal range of motion.  Skin:    General: Skin is warm and dry.  Neurological:     Mental Status: She is alert and oriented to person, place, and time.  Psychiatric:        Behavior: Behavior normal.     ED Results / Procedures / Treatments   Labs (all labs ordered are listed, but only abnormal results are displayed) Labs Reviewed  BASIC METABOLIC PANEL - Abnormal; Notable for the following components:      Result Value   Sodium 134 (*)    Potassium 6.3 (*)    CO2 17 (*)    BUN 34 (*)     Creatinine, Ser 4.79 (*)    GFR, Estimated 9 (*)    All other components within normal limits  CBC - Abnormal; Notable for the following components:   RBC 3.01 (*)    Hemoglobin 8.0 (*)    HCT 26.4 (*)    RDW 19.9 (*)    All other components within normal limits    EKG None  Radiology No results found.  Procedures Procedures    Medications Ordered in ED Medications  sodium zirconium cyclosilicate (LOKELMA) packet 10 g (10 g Oral Given 12/05/22 1826)    ED Course/ Medical Decision Making/ A&P Clinical Course as of 12/05/22 1844  Mon Dec 05, 2022  1844 ECG interpretation  Rate: 63  Rhythm: normal sinus rhythm  QRS Axis: normal  Intervals: normal  ST/T Wave abnormalities: normal  Conduction Disutrbances: none  Narrative Interpretation:    [AH]    Clinical Course User Index [AH] Arthor Captain, PA-C                                 Medical Decision Making Case discussed with Dr. Marisue Humble of nephrology.  He recommends 10 g of Lokelma and safe for outpatient dialysis tomorrow.  Patient does not appear volume overloaded.  Her EKG is normal without T wave spikes.  Appropriate for discharge at this time.  Amount and/or Complexity of Data Reviewed Labs: ordered. ECG/medicine tests: ordered.  Risk Prescription drug management.           Final Clinical Impression(s) / ED Diagnoses Final diagnoses:  Hyperkalemia    Rx / DC Orders ED Discharge Orders     None         Arthor Captain, PA-C 12/05/22 1909    Royanne Foots, DO 12/06/22 1155

## 2022-12-06 ENCOUNTER — Emergency Department (HOSPITAL_COMMUNITY): Payer: Medicare Other

## 2022-12-06 ENCOUNTER — Encounter (HOSPITAL_COMMUNITY): Payer: Self-pay

## 2022-12-06 ENCOUNTER — Emergency Department (HOSPITAL_COMMUNITY)
Admission: EM | Admit: 2022-12-06 | Discharge: 2022-12-06 | Disposition: A | Discharge status: Home / Self Care | Payer: Medicare Other | Attending: Emergency Medicine | Admitting: Emergency Medicine

## 2022-12-06 ENCOUNTER — Other Ambulatory Visit: Payer: Self-pay

## 2022-12-06 DIAGNOSIS — R600 Localized edema: Secondary | ICD-10-CM | POA: Insufficient documentation

## 2022-12-06 DIAGNOSIS — Z992 Dependence on renal dialysis: Secondary | ICD-10-CM | POA: Diagnosis not present

## 2022-12-06 DIAGNOSIS — E1022 Type 1 diabetes mellitus with diabetic chronic kidney disease: Secondary | ICD-10-CM | POA: Diagnosis not present

## 2022-12-06 DIAGNOSIS — D631 Anemia in chronic kidney disease: Secondary | ICD-10-CM | POA: Diagnosis not present

## 2022-12-06 DIAGNOSIS — E875 Hyperkalemia: Secondary | ICD-10-CM | POA: Diagnosis not present

## 2022-12-06 DIAGNOSIS — D689 Coagulation defect, unspecified: Secondary | ICD-10-CM | POA: Diagnosis not present

## 2022-12-06 DIAGNOSIS — R0602 Shortness of breath: Secondary | ICD-10-CM | POA: Insufficient documentation

## 2022-12-06 DIAGNOSIS — N186 End stage renal disease: Secondary | ICD-10-CM | POA: Diagnosis not present

## 2022-12-06 DIAGNOSIS — N2581 Secondary hyperparathyroidism of renal origin: Secondary | ICD-10-CM | POA: Diagnosis not present

## 2022-12-06 DIAGNOSIS — R062 Wheezing: Secondary | ICD-10-CM | POA: Diagnosis not present

## 2022-12-06 DIAGNOSIS — T8249XA Other complication of vascular dialysis catheter, initial encounter: Secondary | ICD-10-CM | POA: Diagnosis not present

## 2022-12-06 DIAGNOSIS — E162 Hypoglycemia, unspecified: Secondary | ICD-10-CM | POA: Insufficient documentation

## 2022-12-06 DIAGNOSIS — D3500 Benign neoplasm of unspecified adrenal gland: Secondary | ICD-10-CM | POA: Diagnosis not present

## 2022-12-06 DIAGNOSIS — R079 Chest pain, unspecified: Secondary | ICD-10-CM | POA: Diagnosis present

## 2022-12-06 DIAGNOSIS — D509 Iron deficiency anemia, unspecified: Secondary | ICD-10-CM | POA: Diagnosis not present

## 2022-12-06 LAB — BASIC METABOLIC PANEL
Anion gap: 9 (ref 5–15)
BUN: 37 mg/dL — ABNORMAL HIGH (ref 8–23)
CO2: 18 mmol/L — ABNORMAL LOW (ref 22–32)
Calcium: 9.1 mg/dL (ref 8.9–10.3)
Chloride: 106 mmol/L (ref 98–111)
Creatinine, Ser: 4.66 mg/dL — ABNORMAL HIGH (ref 0.44–1.00)
GFR, Estimated: 9 mL/min — ABNORMAL LOW (ref >60)
Glucose, Bld: 61 mg/dL — ABNORMAL LOW (ref 70–99)
Potassium: 6.4 mmol/L (ref 3.5–5.1)
Sodium: 133 mmol/L — ABNORMAL LOW (ref 135–145)

## 2022-12-06 LAB — CBC
HCT: 26.5 % — ABNORMAL LOW (ref 36.0–46.0)
Hemoglobin: 7.9 g/dL — ABNORMAL LOW (ref 12.0–15.0)
MCH: 25.8 pg — ABNORMAL LOW (ref 26.0–34.0)
MCHC: 29.8 g/dL — ABNORMAL LOW (ref 30.0–36.0)
MCV: 86.6 fL (ref 80.0–100.0)
Platelets: 335 10*3/uL (ref 150–400)
RBC: 3.06 MIL/uL — ABNORMAL LOW (ref 3.87–5.11)
RDW: 19.8 % — ABNORMAL HIGH (ref 11.5–15.5)
WBC: 8.8 10*3/uL (ref 4.0–10.5)
nRBC: 0 % (ref 0.0–0.2)

## 2022-12-06 LAB — HEPATITIS B SURFACE ANTIGEN: Hepatitis B Surface Ag: NONREACTIVE

## 2022-12-06 LAB — HEPATITIS B CORE ANTIBODY, TOTAL: Hep B Core Total Ab: NONREACTIVE

## 2022-12-06 MED ORDER — FUROSEMIDE 10 MG/ML IJ SOLN: 40.0000 mg | Freq: Once | INTRAMUSCULAR | Status: DC

## 2022-12-06 MED ORDER — LOKELMA 10 G PO PACK: PACK | ORAL | 0 refills | Status: DC

## 2022-12-06 MED ORDER — SODIUM ZIRCONIUM CYCLOSILICATE 10 G PO PACK: 10.0000 g | PACK | Freq: Once | ORAL | Status: DC

## 2022-12-06 MED ORDER — LOKELMA 10 G PO PACK
10.0000 g | PACK | Freq: Once | ORAL | 0 refills | Status: DC
  Filled 2022-12-06: qty 1, 1d supply, fill #0

## 2022-12-06 MED ORDER — SODIUM ZIRCONIUM CYCLOSILICATE 10 G PO PACK
10.0000 g | PACK | Freq: Once | ORAL | Status: AC
  Administered 2022-12-06: 10 g via ORAL
  Filled 2022-12-06 (×3): qty 1

## 2022-12-06 MED ORDER — FUROSEMIDE 20 MG PO TABS
40.0000 mg | ORAL_TABLET | Freq: Once | ORAL | Status: AC
  Administered 2022-12-06: 40 mg via ORAL
  Filled 2022-12-06: qty 2

## 2022-12-06 NOTE — ED Triage Notes (Addendum)
Pt to ED via pov from dialysis center. Pt went to have dialysis this morning (first day of dialysis) but they would not do her dialysis because she needed a Hepatitis B panel. Pt's family states that they would not draw in dialysis. Pt c/o chest pain for approximately one year. Pt denies nausea, vomiting, or shortness of breath.

## 2022-12-06 NOTE — ED Provider Notes (Incomplete)
[place] EMERGENCY DEPARTMENT AT [facility] Provider Note   CSN: [id number] Arrival date & time: 12/06/22  1347     History {Add pertinent medical, surgical, social history, OB history to HPI:1} Chief Complaint  Patient presents with  . Needs Dialysis    Judy Chang is a 72 y.o. female who presents the emergency department with need for dialysis.  Patient's husband is at bedside and is frustrated.  Patient went to dialysis today but was told because she did not have a hepatitis B screening she could not have dialysis.  I was the provider who saw this patient yesterday and there is no mention in the triage note of need for hepatitis B screening test nor was it expressed to me during our visit.  Patient's husband states "we told everybody.". The patient is complaining of some mild shortness of breath and some intermittent chest pain since yesterday. She is notably significantly more volume overloaded since yesterday with a big increase in her pressure.  She now has diffuse pitting edema and prolonged expiratory phase.  Triage labs show potassium of 6.4 and she is currently drinking Lokelma.  HPI     Home Medications Prior to Admission medications   Medication Sig Start Date End Date Taking? Authorizing Provider  amLODipine (NORVASC) 10 MG tablet Take 1 tablet (10 mg total) by mouth daily. 09/18/22   Joseph Art, DO  buPROPion (WELLBUTRIN XL) 150 MG 24 hr tablet Take 1 tablet (150 mg total) by mouth every morning. 09/28/22   Joseph Art, DO  carvedilol (COREG) 12.5 MG tablet Take 1 tablet (12.5 mg total) by mouth 2 (two) times daily with a meal. 02/17/19   Meredeth Ide, MD  cloNIDine (CATAPRES - DOSED IN MG/24 HR) 0.2 mg/24hr patch Place 1 patch (0.2 mg total) onto the skin once a week. 02/23/19   Meredeth Ide, MD  DULoxetine (CYMBALTA) 30 MG capsule Take 90 mg by mouth daily. 03/12/21   [provider]  gentamicin cream (GARAMYCIN) 0.1 % Apply 1  Application topically once a week. 11/04/22   [provider]  lactulose (CHRONULAC) 10 GM/15ML solution Take 10 g by mouth daily as needed for mild constipation or moderate constipation. 11/04/22   [provider]  levETIRAcetam (KEPPRA) 500 MG tablet Take 1 tablet (500 mg total) by mouth every 12 (twelve) hours. 02/17/19   Meredeth Ide, MD  Oxycodone HCl 20 MG TABS Take 1.5 tablets (30 mg total) by mouth every 6 (six) hours as needed. 11/23/22   Emilie Rutter, PA-C  spironolactone (ALDACTONE) 25 MG tablet Take 25 mg by mouth daily. 09/22/22   [provider]  traZODone (DESYREL) 100 MG tablet Take 100 mg by mouth at bedtime. 03/12/21   [provider]      Allergies    Citalopram hydrobromide, Nsaids, and Pregabalin    Review of Systems   Review of Systems  Physical Exam Updated Vital Signs BP (!) 208/77   Pulse 75   Temp 98.4 F (36.9 C)   Resp 12   Ht 5\' 1"  (1.549 m)   Wt 46 kg   SpO2 100%   BMI 19.16 kg/m  Physical Exam Constitutional:      Appearance: She is ill-appearing.  Pulmonary:     Breath sounds: Examination of the right-lower field reveals rales. Examination of the left-lower field reveals rales. Wheezing (mild expiratory) and rales present.  Musculoskeletal:     Right lower leg: 3+ Edema  present.     Left lower leg: 3+ Edema present.    ED Results / Procedures / Treatments   Labs (all labs ordered are listed, but only abnormal results are displayed) Labs Reviewed  BASIC METABOLIC PANEL - Abnormal; Notable for the following components:      Result Value   Sodium 133 (*)    Potassium 6.4 (*)    CO2 18 (*)    Glucose, Bld 61 (*)    BUN 37 (*)    Creatinine, Ser 4.66 (*)    GFR, Estimated 9 (*)    All other components within normal limits  CBC - Abnormal; Notable for the following components:   RBC 3.06 (*)    Hemoglobin 7.9 (*)    HCT 26.5 (*)    MCH 25.8 (*)    MCHC 29.8 (*)    RDW 19.8 (*)    All other  components within normal limits  HEPATITIS B SURFACE ANTIGEN  HEPATITIS B CORE ANTIBODY, TOTAL  HEPATITIS B SURFACE ANTIBODY, QUANTITATIVE    EKG None  Radiology No results found.  Procedures Procedures  {Document cardiac monitor, telemetry assessment procedure when appropriate:1}  Medications Ordered in ED Medications  sodium zirconium cyclosilicate (LOKELMA) packet 10 g (10 g Oral Given 12/06/22 1650)    ED Course/ Medical Decision Making/ A&P Clinical Course as of 12/06/22 2253  Tue Dec 06, 2022  1757 I have not yet heard form Nephrology . Will repage  [AH]    Clinical Course User Index [AH] Arthor Captain, PA-C   {   Click here for ABCD2, HEART and other calculatorsREFRESH Note before signing :1}                              Medical Decision Making Amount and/or Complexity of Data Reviewed Labs: ordered. Radiology: ordered.  Risk Prescription drug management.   Patient here for lab results.  She had labs drawn at triage.  Her hep B surface antigen and core antibody are both negative.  Potassium elevated again at 6.4.  Patient was given 10 g of oral Lokelma.  {Document critical care time when appropriate:1} {Document review of labs and clinical decision tools ie heart score, Chads2Vasc2 etc:1}  {Document your independent review of radiology images, and any outside records:1} {Document your discussion with family members, caretakers, and with consultants:1} {Document social determinants of health affecting pt's care:1} {Document your decision making why or why not admission, treatments were needed:1} Final Clinical Impression(s) / ED Diagnoses Final diagnoses:  None    Rx / DC Orders ED Discharge Orders     None

## 2022-12-06 NOTE — ED Provider Notes (Signed)
EMERGENCY DEPARTMENT AT [facility] Provider Note   CSN: [id number] Arrival date & time: 12/06/22  1347     History  Chief Complaint  Patient presents with   Needs Dialysis    Judy Chang is a 72 y.o. female who presents the emergency department with need for dialysis.  Patient's husband is at bedside and is frustrated.  Patient went to dialysis today but was told because she did not have a hepatitis B screening she could not have dialysis.  I was the provider who saw this patient yesterday and there is no mention in the triage note of need for hepatitis B screening test nor was it expressed to me during our visit.  Patient's husband states "we told everybody.". The patient is complaining of some mild shortness of breath and some intermittent chest pain since yesterday. She is notably significantly more volume overloaded since yesterday with a big increase in her pressure.  She now has diffuse pitting edema and prolonged expiratory phase.  Triage labs show potassium of 6.4 and she is currently drinking Lokelma.  HPI     Home Medications Prior to Admission medications   Medication Sig Start Date End Date Taking? Authorizing Provider  amLODipine (NORVASC) 10 MG tablet Take 1 tablet (10 mg total) by mouth daily. 09/18/22   Joseph Art, DO  buPROPion (WELLBUTRIN XL) 150 MG 24 hr tablet Take 1 tablet (150 mg total) by mouth every morning. 09/28/22   Joseph Art, DO  carvedilol (COREG) 12.5 MG tablet Take 1 tablet (12.5 mg total) by mouth 2 (two) times daily with a meal. 02/17/19   Meredeth Ide, MD  cloNIDine (CATAPRES - DOSED IN MG/24 HR) 0.2 mg/24hr patch Place 1 patch (0.2 mg total) onto the skin once a week. 02/23/19   Meredeth Ide, MD  DULoxetine (CYMBALTA) 30 MG capsule Take 90 mg by mouth daily. 03/12/21   [provider]  gentamicin cream (GARAMYCIN) 0.1 % Apply 1 Application topically once a week. 11/04/22   [provider]   lactulose (CHRONULAC) 10 GM/15ML solution Take 10 g by mouth daily as needed for mild constipation or moderate constipation. 11/04/22   [provider]  levETIRAcetam (KEPPRA) 500 MG tablet Take 1 tablet (500 mg total) by mouth every 12 (twelve) hours. 02/17/19   Meredeth Ide, MD  Oxycodone HCl 20 MG TABS Take 1.5 tablets (30 mg total) by mouth every 6 (six) hours as needed. 11/23/22   Emilie Rutter, PA-C  spironolactone (ALDACTONE) 25 MG tablet Take 25 mg by mouth daily. 09/22/22   [provider]  traZODone (DESYREL) 100 MG tablet Take 100 mg by mouth at bedtime. 03/12/21   [provider]      Allergies    Citalopram hydrobromide, Nsaids, and Pregabalin    Review of Systems   Review of Systems  Physical Exam Updated Vital Signs BP (!) 208/77   Pulse 75   Temp 98.4 F (36.9 C)   Resp 12   Ht 5\' 1"  (1.549 m)   Wt 46 kg   SpO2 100%   BMI 19.16 kg/m  Physical Exam Constitutional:      Appearance: She is ill-appearing.  Pulmonary:     Breath sounds: Examination of the right-lower field reveals rales. Examination of the left-lower field reveals rales. Wheezing (mild expiratory) and rales present.  Musculoskeletal:     Right lower leg: 3+ Edema present.     Left lower leg: 3+  Edema present.    ED Results / Procedures / Treatments   Labs (all labs ordered are listed, but only abnormal results are displayed) Labs Reviewed  BASIC METABOLIC PANEL - Abnormal; Notable for the following components:      Result Value   Sodium 133 (*)    Potassium 6.4 (*)    CO2 18 (*)    Glucose, Bld 61 (*)    BUN 37 (*)    Creatinine, Ser 4.66 (*)    GFR, Estimated 9 (*)    All other components within normal limits  CBC - Abnormal; Notable for the following components:   RBC 3.06 (*)    Hemoglobin 7.9 (*)    HCT 26.5 (*)    MCH 25.8 (*)    MCHC 29.8 (*)    RDW 19.8 (*)    All other components within normal limits  HEPATITIS B SURFACE ANTIGEN  HEPATITIS B  CORE ANTIBODY, TOTAL  HEPATITIS B SURFACE ANTIBODY, QUANTITATIVE    EKG None  Radiology No results found.  Procedures Procedures    Medications Ordered in ED Medications  sodium zirconium cyclosilicate (LOKELMA) packet 10 g (10 g Oral Given 12/06/22 1650)    ED Course/ Medical Decision Making/ A&P Clinical Course as of 12/06/22 2253  Tue Dec 06, 2022  1757 I have not yet heard form Nephrology . Will repage  [AH]    Clinical Course User Index [AH] Arthor Captain, PA-C                                 Medical Decision Making Amount and/or Complexity of Data Reviewed Labs: ordered. Radiology: ordered.  Risk Prescription drug management.   Patient here for lab results.  She had labs drawn at triage.  Her hep B surface antigen and core antibody are both negative.  Potassium elevated again at 6.4.  Patient was given 10 g of oral Lokelma.  I consulted with Dr. Rich Reining x2. She will eat patient will be reevaluated and assessed for potential dialysis this evening.  However patient and husband are extremely resistant and just want to go home so she can get dialysis tomorrow morning. They were assured that she could be dialyzed in the outpatient setting the following day. I reconsulted Dr. Rich Reining he recommends discharging patient with a second days of Lokelma to take tomorrow morning.. Patient does not feel like she is having any significant difficulty breathing.  And shared decision making with Dr. Barbee Cough will also give patient a dose of Lasix to improve fluid volume overload.  Printed out her hepatitis results.  I ordered and interpreted 1 view chest x-ray which shows no acute findings.  I ordered and interpreted labs.  She has baseline renal insufficiency, elevated potassium.  Slightly low blood sugar.  Patient offered food.  I reviewed patient's EKG which shows sinus rhythm without elevated T waves.  Patient appears safe for discharge with close outpatient  dialysis tomorrow morning and strict return precautions.        Final Clinical Impression(s) / ED Diagnoses Final diagnoses:  Hyperkalemia    Rx / DC Orders ED Discharge Orders     None         Arthor Captain, PA-C 12/10/22 0934    Royanne Foots, DO 12/11/22 1823

## 2022-12-06 NOTE — ED Notes (Signed)
Pt prefers to hold off on labs and x-ray until she speaks with a provider. Orders canceled.

## 2022-12-06 NOTE — ED Provider Triage Note (Signed)
Emergency Medicine Provider Triage Evaluation Note  Judy Chang , a 72 y.o. female  was evaluated in triage.  Pt complains of dialysis.  Patient was seen yesterday for similar concerns it appears that patient has not been able to get into dialysis session.  Patient was evaluated by Dr. Arlean Hopping while patient was in triage and had labs placed by him.  Patient denies any acute concerns at this time but does feel somewhat annoyed about having to be back in the emergency department.  Review of Systems  Positive: As above Negative: As above  Physical Exam  BP (!) 171/65 (BP Location: Left Arm)   Pulse 64   Temp 98.4 F (36.9 C)   Resp 16   Ht 5\' 1"  (1.549 m)   Wt 46 kg   SpO2 99%   BMI 19.16 kg/m  Gen:   Awake, no distress, fatigued Resp:  Normal effort  MSK:   Moves extremities without difficulty  Other:    Medical Decision Making  Medically screening exam initiated at 4:27 PM.  Appropriate orders placed.  Judy Chang was informed that the remainder of the evaluation will be completed by another provider, this initial triage assessment does not replace that evaluation, and the importance of remaining in the ED until their evaluation is complete.  Please see Dr. Arlean Hopping note for recommendations. Dr. Arlean Hopping ordered lokelma for patient's hyperkalemia.   Judy Knudsen, PA-C 12/06/22 [phone]

## 2022-12-06 NOTE — Progress Notes (Signed)
Pt seen in Triage area.  They were here yesterday to get labs since she has not had dialysis in about 2 weeks (transitioning from PD to HD). Labs were drawn yesterday (cmet, cbc) and they were dc'd home. They went to HD today but couldn't get HD because the hep B labs weren't drawn so they came back to [facility] ED and are here in the triage area now.  I examined the patient and her lungs are clear, 2+ tight edema bilat LE's. Labs show good creat at 4.5, and K+ 6.3. No distress, Ox 3. Having chronic pain issues. I have ordered the hep B labs to be drawn. And have ordered lokelma 10gm one time to be given now for moderately high K+. They have missed today's outpatient HD by this time, I have d/w her outpatient renal MD and it is okay for her to skip today and start the HD this Thursday (/11/14) at her outpatient HD unit at the same time as she would have gone today. The hep B labs will be back by then. Have d/w Triage RN.  Have explained to the husband and the patient in detail and answered their questions. They understand they are to go to her dialysis unit on Thursday as above.   Vinson Moselle  MD  [facility] 12/06/2022, 2:51 PM  Recent Labs  Lab 12/05/22 1612  HGB 8.0*  CALCIUM 8.9  CREATININE 4.79*  K 6.3*    Inpatient medications:

## 2022-12-06 NOTE — Discharge Instructions (Signed)
Please go to dialysis tomorrow morning.  I am discharging you with a copy of all of your lab values.  Contact a health care provider if: You have an irregular or very slow heartbeat. You feel light-headed. You feel weak. You are nauseous. You have tingling or numbness in your hands or feet. Get help right away if: You have shortness of breath. You have chest pain or discomfort. You faint. You have muscle paralysis. These symptoms may be an emergency. Get help right away. Call 911. Do not wait to see if the symptoms will go away. Do not drive yourself to the hospital.

## 2022-12-07 ENCOUNTER — Other Ambulatory Visit (HOSPITAL_COMMUNITY): Payer: Self-pay

## 2022-12-07 LAB — HEPATITIS B SURFACE ANTIBODY, QUANTITATIVE: Hep B S AB Quant (Post): <3.5 m[IU]/mL — ABNORMAL LOW

## 2022-12-08 DIAGNOSIS — N2581 Secondary hyperparathyroidism of renal origin: Secondary | ICD-10-CM | POA: Diagnosis not present

## 2022-12-08 DIAGNOSIS — D3500 Benign neoplasm of unspecified adrenal gland: Secondary | ICD-10-CM | POA: Diagnosis not present

## 2022-12-08 DIAGNOSIS — T8249XA Other complication of vascular dialysis catheter, initial encounter: Secondary | ICD-10-CM | POA: Diagnosis not present

## 2022-12-08 DIAGNOSIS — D509 Iron deficiency anemia, unspecified: Secondary | ICD-10-CM | POA: Diagnosis not present

## 2022-12-08 DIAGNOSIS — N186 End stage renal disease: Secondary | ICD-10-CM | POA: Diagnosis not present

## 2022-12-08 DIAGNOSIS — D631 Anemia in chronic kidney disease: Secondary | ICD-10-CM | POA: Diagnosis not present

## 2022-12-08 DIAGNOSIS — E1022 Type 1 diabetes mellitus with diabetic chronic kidney disease: Secondary | ICD-10-CM | POA: Diagnosis not present

## 2022-12-08 DIAGNOSIS — D689 Coagulation defect, unspecified: Secondary | ICD-10-CM | POA: Diagnosis not present

## 2022-12-08 DIAGNOSIS — Z992 Dependence on renal dialysis: Secondary | ICD-10-CM | POA: Diagnosis not present

## 2022-12-10 DIAGNOSIS — T8249XA Other complication of vascular dialysis catheter, initial encounter: Secondary | ICD-10-CM | POA: Diagnosis not present

## 2022-12-10 DIAGNOSIS — N186 End stage renal disease: Secondary | ICD-10-CM | POA: Diagnosis not present

## 2022-12-10 DIAGNOSIS — D689 Coagulation defect, unspecified: Secondary | ICD-10-CM | POA: Diagnosis not present

## 2022-12-10 DIAGNOSIS — E1022 Type 1 diabetes mellitus with diabetic chronic kidney disease: Secondary | ICD-10-CM | POA: Diagnosis not present

## 2022-12-10 DIAGNOSIS — Z992 Dependence on renal dialysis: Secondary | ICD-10-CM | POA: Diagnosis not present

## 2022-12-10 DIAGNOSIS — D631 Anemia in chronic kidney disease: Secondary | ICD-10-CM | POA: Diagnosis not present

## 2022-12-10 DIAGNOSIS — D509 Iron deficiency anemia, unspecified: Secondary | ICD-10-CM | POA: Diagnosis not present

## 2022-12-10 DIAGNOSIS — N2581 Secondary hyperparathyroidism of renal origin: Secondary | ICD-10-CM | POA: Diagnosis not present

## 2022-12-10 DIAGNOSIS — D3500 Benign neoplasm of unspecified adrenal gland: Secondary | ICD-10-CM | POA: Diagnosis not present

## 2022-12-13 DIAGNOSIS — N2581 Secondary hyperparathyroidism of renal origin: Secondary | ICD-10-CM | POA: Diagnosis not present

## 2022-12-13 DIAGNOSIS — N186 End stage renal disease: Secondary | ICD-10-CM | POA: Diagnosis not present

## 2022-12-13 DIAGNOSIS — D509 Iron deficiency anemia, unspecified: Secondary | ICD-10-CM | POA: Diagnosis not present

## 2022-12-13 DIAGNOSIS — D689 Coagulation defect, unspecified: Secondary | ICD-10-CM | POA: Diagnosis not present

## 2022-12-13 DIAGNOSIS — D631 Anemia in chronic kidney disease: Secondary | ICD-10-CM | POA: Diagnosis not present

## 2022-12-13 DIAGNOSIS — D3500 Benign neoplasm of unspecified adrenal gland: Secondary | ICD-10-CM | POA: Diagnosis not present

## 2022-12-13 DIAGNOSIS — T8249XA Other complication of vascular dialysis catheter, initial encounter: Secondary | ICD-10-CM | POA: Diagnosis not present

## 2022-12-13 DIAGNOSIS — Z992 Dependence on renal dialysis: Secondary | ICD-10-CM | POA: Diagnosis not present

## 2022-12-13 DIAGNOSIS — E1022 Type 1 diabetes mellitus with diabetic chronic kidney disease: Secondary | ICD-10-CM | POA: Diagnosis not present

## 2022-12-15 DIAGNOSIS — D509 Iron deficiency anemia, unspecified: Secondary | ICD-10-CM | POA: Diagnosis not present

## 2022-12-15 DIAGNOSIS — N2581 Secondary hyperparathyroidism of renal origin: Secondary | ICD-10-CM | POA: Diagnosis not present

## 2022-12-15 DIAGNOSIS — D631 Anemia in chronic kidney disease: Secondary | ICD-10-CM | POA: Diagnosis not present

## 2022-12-15 DIAGNOSIS — N186 End stage renal disease: Secondary | ICD-10-CM | POA: Diagnosis not present

## 2022-12-15 DIAGNOSIS — T8249XA Other complication of vascular dialysis catheter, initial encounter: Secondary | ICD-10-CM | POA: Diagnosis not present

## 2022-12-15 DIAGNOSIS — D3500 Benign neoplasm of unspecified adrenal gland: Secondary | ICD-10-CM | POA: Diagnosis not present

## 2022-12-15 DIAGNOSIS — E1022 Type 1 diabetes mellitus with diabetic chronic kidney disease: Secondary | ICD-10-CM | POA: Diagnosis not present

## 2022-12-15 DIAGNOSIS — D689 Coagulation defect, unspecified: Secondary | ICD-10-CM | POA: Diagnosis not present

## 2022-12-15 DIAGNOSIS — Z992 Dependence on renal dialysis: Secondary | ICD-10-CM | POA: Diagnosis not present

## 2022-12-17 DIAGNOSIS — D631 Anemia in chronic kidney disease: Secondary | ICD-10-CM | POA: Diagnosis not present

## 2022-12-17 DIAGNOSIS — Z992 Dependence on renal dialysis: Secondary | ICD-10-CM | POA: Diagnosis not present

## 2022-12-17 DIAGNOSIS — D3500 Benign neoplasm of unspecified adrenal gland: Secondary | ICD-10-CM | POA: Diagnosis not present

## 2022-12-17 DIAGNOSIS — D689 Coagulation defect, unspecified: Secondary | ICD-10-CM | POA: Diagnosis not present

## 2022-12-17 DIAGNOSIS — E1022 Type 1 diabetes mellitus with diabetic chronic kidney disease: Secondary | ICD-10-CM | POA: Diagnosis not present

## 2022-12-17 DIAGNOSIS — N2581 Secondary hyperparathyroidism of renal origin: Secondary | ICD-10-CM | POA: Diagnosis not present

## 2022-12-17 DIAGNOSIS — T8249XA Other complication of vascular dialysis catheter, initial encounter: Secondary | ICD-10-CM | POA: Diagnosis not present

## 2022-12-17 DIAGNOSIS — D509 Iron deficiency anemia, unspecified: Secondary | ICD-10-CM | POA: Diagnosis not present

## 2022-12-17 DIAGNOSIS — N186 End stage renal disease: Secondary | ICD-10-CM | POA: Diagnosis not present

## 2022-12-19 DIAGNOSIS — D689 Coagulation defect, unspecified: Secondary | ICD-10-CM | POA: Diagnosis not present

## 2022-12-19 DIAGNOSIS — N186 End stage renal disease: Secondary | ICD-10-CM | POA: Diagnosis not present

## 2022-12-19 DIAGNOSIS — T8249XA Other complication of vascular dialysis catheter, initial encounter: Secondary | ICD-10-CM | POA: Diagnosis not present

## 2022-12-19 DIAGNOSIS — D631 Anemia in chronic kidney disease: Secondary | ICD-10-CM | POA: Diagnosis not present

## 2022-12-19 DIAGNOSIS — Z992 Dependence on renal dialysis: Secondary | ICD-10-CM | POA: Diagnosis not present

## 2022-12-19 DIAGNOSIS — D3500 Benign neoplasm of unspecified adrenal gland: Secondary | ICD-10-CM | POA: Diagnosis not present

## 2022-12-19 DIAGNOSIS — N2581 Secondary hyperparathyroidism of renal origin: Secondary | ICD-10-CM | POA: Diagnosis not present

## 2022-12-19 DIAGNOSIS — D509 Iron deficiency anemia, unspecified: Secondary | ICD-10-CM | POA: Diagnosis not present

## 2022-12-19 DIAGNOSIS — E1022 Type 1 diabetes mellitus with diabetic chronic kidney disease: Secondary | ICD-10-CM | POA: Diagnosis not present

## 2022-12-21 DIAGNOSIS — Z992 Dependence on renal dialysis: Secondary | ICD-10-CM | POA: Diagnosis not present

## 2022-12-21 DIAGNOSIS — E1022 Type 1 diabetes mellitus with diabetic chronic kidney disease: Secondary | ICD-10-CM | POA: Diagnosis not present

## 2022-12-21 DIAGNOSIS — D689 Coagulation defect, unspecified: Secondary | ICD-10-CM | POA: Diagnosis not present

## 2022-12-21 DIAGNOSIS — D509 Iron deficiency anemia, unspecified: Secondary | ICD-10-CM | POA: Diagnosis not present

## 2022-12-21 DIAGNOSIS — N2581 Secondary hyperparathyroidism of renal origin: Secondary | ICD-10-CM | POA: Diagnosis not present

## 2022-12-21 DIAGNOSIS — N186 End stage renal disease: Secondary | ICD-10-CM | POA: Diagnosis not present

## 2022-12-21 DIAGNOSIS — T8249XA Other complication of vascular dialysis catheter, initial encounter: Secondary | ICD-10-CM | POA: Diagnosis not present

## 2022-12-21 DIAGNOSIS — D631 Anemia in chronic kidney disease: Secondary | ICD-10-CM | POA: Diagnosis not present

## 2022-12-21 DIAGNOSIS — D3500 Benign neoplasm of unspecified adrenal gland: Secondary | ICD-10-CM | POA: Diagnosis not present

## 2022-12-24 DIAGNOSIS — N2581 Secondary hyperparathyroidism of renal origin: Secondary | ICD-10-CM | POA: Diagnosis not present

## 2022-12-24 DIAGNOSIS — D631 Anemia in chronic kidney disease: Secondary | ICD-10-CM | POA: Diagnosis not present

## 2022-12-24 DIAGNOSIS — T8249XA Other complication of vascular dialysis catheter, initial encounter: Secondary | ICD-10-CM | POA: Diagnosis not present

## 2022-12-24 DIAGNOSIS — N186 End stage renal disease: Secondary | ICD-10-CM | POA: Diagnosis not present

## 2022-12-24 DIAGNOSIS — E1022 Type 1 diabetes mellitus with diabetic chronic kidney disease: Secondary | ICD-10-CM | POA: Diagnosis not present

## 2022-12-24 DIAGNOSIS — D3500 Benign neoplasm of unspecified adrenal gland: Secondary | ICD-10-CM | POA: Diagnosis not present

## 2022-12-24 DIAGNOSIS — D689 Coagulation defect, unspecified: Secondary | ICD-10-CM | POA: Diagnosis not present

## 2022-12-24 DIAGNOSIS — Z992 Dependence on renal dialysis: Secondary | ICD-10-CM | POA: Diagnosis not present

## 2022-12-24 DIAGNOSIS — D509 Iron deficiency anemia, unspecified: Secondary | ICD-10-CM | POA: Diagnosis not present

## 2022-12-27 DIAGNOSIS — E1022 Type 1 diabetes mellitus with diabetic chronic kidney disease: Secondary | ICD-10-CM | POA: Diagnosis not present

## 2022-12-27 DIAGNOSIS — N2581 Secondary hyperparathyroidism of renal origin: Secondary | ICD-10-CM | POA: Diagnosis not present

## 2022-12-27 DIAGNOSIS — N186 End stage renal disease: Secondary | ICD-10-CM | POA: Diagnosis not present

## 2022-12-27 DIAGNOSIS — D509 Iron deficiency anemia, unspecified: Secondary | ICD-10-CM | POA: Diagnosis not present

## 2022-12-27 DIAGNOSIS — D631 Anemia in chronic kidney disease: Secondary | ICD-10-CM | POA: Diagnosis not present

## 2022-12-27 DIAGNOSIS — D689 Coagulation defect, unspecified: Secondary | ICD-10-CM | POA: Diagnosis not present

## 2022-12-27 DIAGNOSIS — Z992 Dependence on renal dialysis: Secondary | ICD-10-CM | POA: Diagnosis not present

## 2022-12-29 DIAGNOSIS — D631 Anemia in chronic kidney disease: Secondary | ICD-10-CM | POA: Diagnosis not present

## 2022-12-29 DIAGNOSIS — N186 End stage renal disease: Secondary | ICD-10-CM | POA: Diagnosis not present

## 2022-12-29 DIAGNOSIS — D689 Coagulation defect, unspecified: Secondary | ICD-10-CM | POA: Diagnosis not present

## 2022-12-29 DIAGNOSIS — E1022 Type 1 diabetes mellitus with diabetic chronic kidney disease: Secondary | ICD-10-CM | POA: Diagnosis not present

## 2022-12-29 DIAGNOSIS — Z992 Dependence on renal dialysis: Secondary | ICD-10-CM | POA: Diagnosis not present

## 2022-12-29 DIAGNOSIS — D509 Iron deficiency anemia, unspecified: Secondary | ICD-10-CM | POA: Diagnosis not present

## 2022-12-29 DIAGNOSIS — N2581 Secondary hyperparathyroidism of renal origin: Secondary | ICD-10-CM | POA: Diagnosis not present

## 2022-12-31 DIAGNOSIS — Z992 Dependence on renal dialysis: Secondary | ICD-10-CM | POA: Diagnosis not present

## 2022-12-31 DIAGNOSIS — N2581 Secondary hyperparathyroidism of renal origin: Secondary | ICD-10-CM | POA: Diagnosis not present

## 2022-12-31 DIAGNOSIS — D689 Coagulation defect, unspecified: Secondary | ICD-10-CM | POA: Diagnosis not present

## 2022-12-31 DIAGNOSIS — N186 End stage renal disease: Secondary | ICD-10-CM | POA: Diagnosis not present

## 2022-12-31 DIAGNOSIS — E1022 Type 1 diabetes mellitus with diabetic chronic kidney disease: Secondary | ICD-10-CM | POA: Diagnosis not present

## 2022-12-31 DIAGNOSIS — D631 Anemia in chronic kidney disease: Secondary | ICD-10-CM | POA: Diagnosis not present

## 2022-12-31 DIAGNOSIS — D509 Iron deficiency anemia, unspecified: Secondary | ICD-10-CM | POA: Diagnosis not present

## 2023-01-03 DIAGNOSIS — D509 Iron deficiency anemia, unspecified: Secondary | ICD-10-CM | POA: Diagnosis not present

## 2023-01-03 DIAGNOSIS — Z992 Dependence on renal dialysis: Secondary | ICD-10-CM | POA: Diagnosis not present

## 2023-01-03 DIAGNOSIS — D631 Anemia in chronic kidney disease: Secondary | ICD-10-CM | POA: Diagnosis not present

## 2023-01-03 DIAGNOSIS — E1022 Type 1 diabetes mellitus with diabetic chronic kidney disease: Secondary | ICD-10-CM | POA: Diagnosis not present

## 2023-01-03 DIAGNOSIS — N2581 Secondary hyperparathyroidism of renal origin: Secondary | ICD-10-CM | POA: Diagnosis not present

## 2023-01-03 DIAGNOSIS — D689 Coagulation defect, unspecified: Secondary | ICD-10-CM | POA: Diagnosis not present

## 2023-01-03 DIAGNOSIS — N186 End stage renal disease: Secondary | ICD-10-CM | POA: Diagnosis not present

## 2023-01-05 DIAGNOSIS — E1022 Type 1 diabetes mellitus with diabetic chronic kidney disease: Secondary | ICD-10-CM | POA: Diagnosis not present

## 2023-01-05 DIAGNOSIS — N2581 Secondary hyperparathyroidism of renal origin: Secondary | ICD-10-CM | POA: Diagnosis not present

## 2023-01-05 DIAGNOSIS — D631 Anemia in chronic kidney disease: Secondary | ICD-10-CM | POA: Diagnosis not present

## 2023-01-05 DIAGNOSIS — D509 Iron deficiency anemia, unspecified: Secondary | ICD-10-CM | POA: Diagnosis not present

## 2023-01-05 DIAGNOSIS — N186 End stage renal disease: Secondary | ICD-10-CM | POA: Diagnosis not present

## 2023-01-05 DIAGNOSIS — Z992 Dependence on renal dialysis: Secondary | ICD-10-CM | POA: Diagnosis not present

## 2023-01-05 DIAGNOSIS — D689 Coagulation defect, unspecified: Secondary | ICD-10-CM | POA: Diagnosis not present

## 2023-01-06 ENCOUNTER — Other Ambulatory Visit (HOSPITAL_COMMUNITY): Payer: Self-pay

## 2023-01-06 DIAGNOSIS — D649 Anemia, unspecified: Secondary | ICD-10-CM

## 2023-01-07 DIAGNOSIS — D509 Iron deficiency anemia, unspecified: Secondary | ICD-10-CM | POA: Diagnosis not present

## 2023-01-07 DIAGNOSIS — D689 Coagulation defect, unspecified: Secondary | ICD-10-CM | POA: Diagnosis not present

## 2023-01-07 DIAGNOSIS — N186 End stage renal disease: Secondary | ICD-10-CM | POA: Diagnosis not present

## 2023-01-07 DIAGNOSIS — E1022 Type 1 diabetes mellitus with diabetic chronic kidney disease: Secondary | ICD-10-CM | POA: Diagnosis not present

## 2023-01-07 DIAGNOSIS — N2581 Secondary hyperparathyroidism of renal origin: Secondary | ICD-10-CM | POA: Diagnosis not present

## 2023-01-07 DIAGNOSIS — Z992 Dependence on renal dialysis: Secondary | ICD-10-CM | POA: Diagnosis not present

## 2023-01-07 DIAGNOSIS — D631 Anemia in chronic kidney disease: Secondary | ICD-10-CM | POA: Diagnosis not present

## 2023-01-09 ENCOUNTER — Ambulatory Visit (HOSPITAL_COMMUNITY)
Admission: RE | Admit: 2023-01-09 | Discharge: 2023-01-09 | Disposition: A | Discharge status: Home / Self Care | Payer: Medicare Other | Source: Ambulatory Visit | Attending: Nephrology | Admitting: Nephrology

## 2023-01-09 DIAGNOSIS — D649 Anemia, unspecified: Secondary | ICD-10-CM | POA: Diagnosis not present

## 2023-01-09 LAB — PREPARE RBC (CROSSMATCH)

## 2023-01-09 MED ORDER — SODIUM CHLORIDE 0.9% IV SOLUTION: Freq: Once | INTRAVENOUS | Status: DC

## 2023-01-10 DIAGNOSIS — Z992 Dependence on renal dialysis: Secondary | ICD-10-CM | POA: Diagnosis not present

## 2023-01-10 DIAGNOSIS — D509 Iron deficiency anemia, unspecified: Secondary | ICD-10-CM | POA: Diagnosis not present

## 2023-01-10 DIAGNOSIS — E1022 Type 1 diabetes mellitus with diabetic chronic kidney disease: Secondary | ICD-10-CM | POA: Diagnosis not present

## 2023-01-10 DIAGNOSIS — D689 Coagulation defect, unspecified: Secondary | ICD-10-CM | POA: Diagnosis not present

## 2023-01-10 DIAGNOSIS — N186 End stage renal disease: Secondary | ICD-10-CM | POA: Diagnosis not present

## 2023-01-10 DIAGNOSIS — N2581 Secondary hyperparathyroidism of renal origin: Secondary | ICD-10-CM | POA: Diagnosis not present

## 2023-01-10 DIAGNOSIS — D631 Anemia in chronic kidney disease: Secondary | ICD-10-CM | POA: Diagnosis not present

## 2023-01-10 LAB — TYPE AND SCREEN
ABO/RH(D): B NEG
Antibody Screen: NEGATIVE
Unit division: 0

## 2023-01-10 LAB — BPAM RBC
Blood Product Expiration Date: 202412282359
ISSUE DATE / TIME: 202412161043
Unit Type and Rh: 1700

## 2023-01-12 DIAGNOSIS — N2581 Secondary hyperparathyroidism of renal origin: Secondary | ICD-10-CM | POA: Diagnosis not present

## 2023-01-12 DIAGNOSIS — D689 Coagulation defect, unspecified: Secondary | ICD-10-CM | POA: Diagnosis not present

## 2023-01-12 DIAGNOSIS — D509 Iron deficiency anemia, unspecified: Secondary | ICD-10-CM | POA: Diagnosis not present

## 2023-01-12 DIAGNOSIS — Z992 Dependence on renal dialysis: Secondary | ICD-10-CM | POA: Diagnosis not present

## 2023-01-12 DIAGNOSIS — E1022 Type 1 diabetes mellitus with diabetic chronic kidney disease: Secondary | ICD-10-CM | POA: Diagnosis not present

## 2023-01-12 DIAGNOSIS — D631 Anemia in chronic kidney disease: Secondary | ICD-10-CM | POA: Diagnosis not present

## 2023-01-12 DIAGNOSIS — N186 End stage renal disease: Secondary | ICD-10-CM | POA: Diagnosis not present

## 2023-01-14 DIAGNOSIS — E1022 Type 1 diabetes mellitus with diabetic chronic kidney disease: Secondary | ICD-10-CM | POA: Diagnosis not present

## 2023-01-14 DIAGNOSIS — N186 End stage renal disease: Secondary | ICD-10-CM | POA: Diagnosis not present

## 2023-01-14 DIAGNOSIS — D631 Anemia in chronic kidney disease: Secondary | ICD-10-CM | POA: Diagnosis not present

## 2023-01-14 DIAGNOSIS — D509 Iron deficiency anemia, unspecified: Secondary | ICD-10-CM | POA: Diagnosis not present

## 2023-01-14 DIAGNOSIS — D689 Coagulation defect, unspecified: Secondary | ICD-10-CM | POA: Diagnosis not present

## 2023-01-14 DIAGNOSIS — Z992 Dependence on renal dialysis: Secondary | ICD-10-CM | POA: Diagnosis not present

## 2023-01-14 DIAGNOSIS — N2581 Secondary hyperparathyroidism of renal origin: Secondary | ICD-10-CM | POA: Diagnosis not present

## 2023-01-16 DIAGNOSIS — E1022 Type 1 diabetes mellitus with diabetic chronic kidney disease: Secondary | ICD-10-CM | POA: Diagnosis not present

## 2023-01-16 DIAGNOSIS — N2581 Secondary hyperparathyroidism of renal origin: Secondary | ICD-10-CM | POA: Diagnosis not present

## 2023-01-16 DIAGNOSIS — D509 Iron deficiency anemia, unspecified: Secondary | ICD-10-CM | POA: Diagnosis not present

## 2023-01-16 DIAGNOSIS — Z992 Dependence on renal dialysis: Secondary | ICD-10-CM | POA: Diagnosis not present

## 2023-01-16 DIAGNOSIS — D631 Anemia in chronic kidney disease: Secondary | ICD-10-CM | POA: Diagnosis not present

## 2023-01-16 DIAGNOSIS — N186 End stage renal disease: Secondary | ICD-10-CM | POA: Diagnosis not present

## 2023-01-16 DIAGNOSIS — D689 Coagulation defect, unspecified: Secondary | ICD-10-CM | POA: Diagnosis not present

## 2023-01-19 DIAGNOSIS — Z992 Dependence on renal dialysis: Secondary | ICD-10-CM | POA: Diagnosis not present

## 2023-01-19 DIAGNOSIS — N186 End stage renal disease: Secondary | ICD-10-CM | POA: Diagnosis not present

## 2023-01-19 DIAGNOSIS — D509 Iron deficiency anemia, unspecified: Secondary | ICD-10-CM | POA: Diagnosis not present

## 2023-01-19 DIAGNOSIS — D689 Coagulation defect, unspecified: Secondary | ICD-10-CM | POA: Diagnosis not present

## 2023-01-19 DIAGNOSIS — D631 Anemia in chronic kidney disease: Secondary | ICD-10-CM | POA: Diagnosis not present

## 2023-01-19 DIAGNOSIS — E1022 Type 1 diabetes mellitus with diabetic chronic kidney disease: Secondary | ICD-10-CM | POA: Diagnosis not present

## 2023-01-19 DIAGNOSIS — N2581 Secondary hyperparathyroidism of renal origin: Secondary | ICD-10-CM | POA: Diagnosis not present

## 2023-01-21 DIAGNOSIS — N186 End stage renal disease: Secondary | ICD-10-CM | POA: Diagnosis not present

## 2023-01-21 DIAGNOSIS — N2581 Secondary hyperparathyroidism of renal origin: Secondary | ICD-10-CM | POA: Diagnosis not present

## 2023-01-21 DIAGNOSIS — E1022 Type 1 diabetes mellitus with diabetic chronic kidney disease: Secondary | ICD-10-CM | POA: Diagnosis not present

## 2023-01-21 DIAGNOSIS — D509 Iron deficiency anemia, unspecified: Secondary | ICD-10-CM | POA: Diagnosis not present

## 2023-01-21 DIAGNOSIS — D689 Coagulation defect, unspecified: Secondary | ICD-10-CM | POA: Diagnosis not present

## 2023-01-21 DIAGNOSIS — D631 Anemia in chronic kidney disease: Secondary | ICD-10-CM | POA: Diagnosis not present

## 2023-01-21 DIAGNOSIS — Z992 Dependence on renal dialysis: Secondary | ICD-10-CM | POA: Diagnosis not present

## 2023-01-23 DIAGNOSIS — D509 Iron deficiency anemia, unspecified: Secondary | ICD-10-CM | POA: Diagnosis not present

## 2023-01-23 DIAGNOSIS — E1022 Type 1 diabetes mellitus with diabetic chronic kidney disease: Secondary | ICD-10-CM | POA: Diagnosis not present

## 2023-01-23 DIAGNOSIS — D689 Coagulation defect, unspecified: Secondary | ICD-10-CM | POA: Diagnosis not present

## 2023-01-23 DIAGNOSIS — N186 End stage renal disease: Secondary | ICD-10-CM | POA: Diagnosis not present

## 2023-01-23 DIAGNOSIS — D631 Anemia in chronic kidney disease: Secondary | ICD-10-CM | POA: Diagnosis not present

## 2023-01-23 DIAGNOSIS — N2581 Secondary hyperparathyroidism of renal origin: Secondary | ICD-10-CM | POA: Diagnosis not present

## 2023-01-23 DIAGNOSIS — Z992 Dependence on renal dialysis: Secondary | ICD-10-CM | POA: Diagnosis not present

## 2023-01-24 DIAGNOSIS — Z992 Dependence on renal dialysis: Secondary | ICD-10-CM | POA: Diagnosis not present

## 2023-01-24 DIAGNOSIS — N186 End stage renal disease: Secondary | ICD-10-CM | POA: Diagnosis not present

## 2023-01-24 DIAGNOSIS — E1022 Type 1 diabetes mellitus with diabetic chronic kidney disease: Secondary | ICD-10-CM | POA: Diagnosis not present

## 2023-01-26 DIAGNOSIS — N186 End stage renal disease: Secondary | ICD-10-CM | POA: Diagnosis not present

## 2023-01-26 DIAGNOSIS — N2581 Secondary hyperparathyroidism of renal origin: Secondary | ICD-10-CM | POA: Diagnosis not present

## 2023-01-26 DIAGNOSIS — Z992 Dependence on renal dialysis: Secondary | ICD-10-CM | POA: Diagnosis not present

## 2023-01-26 DIAGNOSIS — E1022 Type 1 diabetes mellitus with diabetic chronic kidney disease: Secondary | ICD-10-CM | POA: Diagnosis not present

## 2023-01-26 DIAGNOSIS — D631 Anemia in chronic kidney disease: Secondary | ICD-10-CM | POA: Diagnosis not present

## 2023-01-26 DIAGNOSIS — D689 Coagulation defect, unspecified: Secondary | ICD-10-CM | POA: Diagnosis not present

## 2023-01-26 DIAGNOSIS — D509 Iron deficiency anemia, unspecified: Secondary | ICD-10-CM | POA: Diagnosis not present

## 2023-01-27 DIAGNOSIS — J449 Chronic obstructive pulmonary disease, unspecified: Secondary | ICD-10-CM | POA: Diagnosis not present

## 2023-01-27 DIAGNOSIS — I5189 Other ill-defined heart diseases: Secondary | ICD-10-CM | POA: Diagnosis not present

## 2023-01-27 DIAGNOSIS — I10 Essential (primary) hypertension: Secondary | ICD-10-CM | POA: Diagnosis not present

## 2023-01-27 DIAGNOSIS — E041 Nontoxic single thyroid nodule: Secondary | ICD-10-CM | POA: Diagnosis not present

## 2023-01-27 DIAGNOSIS — G894 Chronic pain syndrome: Secondary | ICD-10-CM | POA: Diagnosis not present

## 2023-01-27 DIAGNOSIS — I120 Hypertensive chronic kidney disease with stage 5 chronic kidney disease or end stage renal disease: Secondary | ICD-10-CM | POA: Diagnosis not present

## 2023-01-27 DIAGNOSIS — J9611 Chronic respiratory failure with hypoxia: Secondary | ICD-10-CM | POA: Diagnosis not present

## 2023-01-27 DIAGNOSIS — I272 Pulmonary hypertension, unspecified: Secondary | ICD-10-CM | POA: Diagnosis not present

## 2023-01-27 DIAGNOSIS — N2581 Secondary hyperparathyroidism of renal origin: Secondary | ICD-10-CM | POA: Diagnosis not present

## 2023-01-27 DIAGNOSIS — E7800 Pure hypercholesterolemia, unspecified: Secondary | ICD-10-CM | POA: Diagnosis not present

## 2023-01-28 DIAGNOSIS — E1022 Type 1 diabetes mellitus with diabetic chronic kidney disease: Secondary | ICD-10-CM | POA: Diagnosis not present

## 2023-01-28 DIAGNOSIS — Z992 Dependence on renal dialysis: Secondary | ICD-10-CM | POA: Diagnosis not present

## 2023-01-28 DIAGNOSIS — N186 End stage renal disease: Secondary | ICD-10-CM | POA: Diagnosis not present

## 2023-01-28 DIAGNOSIS — D689 Coagulation defect, unspecified: Secondary | ICD-10-CM | POA: Diagnosis not present

## 2023-01-28 DIAGNOSIS — D509 Iron deficiency anemia, unspecified: Secondary | ICD-10-CM | POA: Diagnosis not present

## 2023-01-28 DIAGNOSIS — D631 Anemia in chronic kidney disease: Secondary | ICD-10-CM | POA: Diagnosis not present

## 2023-01-28 DIAGNOSIS — N2581 Secondary hyperparathyroidism of renal origin: Secondary | ICD-10-CM | POA: Diagnosis not present

## 2023-01-31 DIAGNOSIS — N186 End stage renal disease: Secondary | ICD-10-CM | POA: Diagnosis not present

## 2023-01-31 DIAGNOSIS — E1022 Type 1 diabetes mellitus with diabetic chronic kidney disease: Secondary | ICD-10-CM | POA: Diagnosis not present

## 2023-01-31 DIAGNOSIS — Z992 Dependence on renal dialysis: Secondary | ICD-10-CM | POA: Diagnosis not present

## 2023-01-31 DIAGNOSIS — D509 Iron deficiency anemia, unspecified: Secondary | ICD-10-CM | POA: Diagnosis not present

## 2023-01-31 DIAGNOSIS — D689 Coagulation defect, unspecified: Secondary | ICD-10-CM | POA: Diagnosis not present

## 2023-01-31 DIAGNOSIS — N2581 Secondary hyperparathyroidism of renal origin: Secondary | ICD-10-CM | POA: Diagnosis not present

## 2023-01-31 DIAGNOSIS — D631 Anemia in chronic kidney disease: Secondary | ICD-10-CM | POA: Diagnosis not present

## 2023-02-02 DIAGNOSIS — N186 End stage renal disease: Secondary | ICD-10-CM | POA: Diagnosis not present

## 2023-02-02 DIAGNOSIS — E1022 Type 1 diabetes mellitus with diabetic chronic kidney disease: Secondary | ICD-10-CM | POA: Diagnosis not present

## 2023-02-02 DIAGNOSIS — D631 Anemia in chronic kidney disease: Secondary | ICD-10-CM | POA: Diagnosis not present

## 2023-02-02 DIAGNOSIS — D509 Iron deficiency anemia, unspecified: Secondary | ICD-10-CM | POA: Diagnosis not present

## 2023-02-02 DIAGNOSIS — Z992 Dependence on renal dialysis: Secondary | ICD-10-CM | POA: Diagnosis not present

## 2023-02-02 DIAGNOSIS — D689 Coagulation defect, unspecified: Secondary | ICD-10-CM | POA: Diagnosis not present

## 2023-02-02 DIAGNOSIS — N2581 Secondary hyperparathyroidism of renal origin: Secondary | ICD-10-CM | POA: Diagnosis not present

## 2023-02-04 DIAGNOSIS — N186 End stage renal disease: Secondary | ICD-10-CM | POA: Diagnosis not present

## 2023-02-04 DIAGNOSIS — N2581 Secondary hyperparathyroidism of renal origin: Secondary | ICD-10-CM | POA: Diagnosis not present

## 2023-02-04 DIAGNOSIS — D509 Iron deficiency anemia, unspecified: Secondary | ICD-10-CM | POA: Diagnosis not present

## 2023-02-04 DIAGNOSIS — Z992 Dependence on renal dialysis: Secondary | ICD-10-CM | POA: Diagnosis not present

## 2023-02-04 DIAGNOSIS — D689 Coagulation defect, unspecified: Secondary | ICD-10-CM | POA: Diagnosis not present

## 2023-02-04 DIAGNOSIS — E1022 Type 1 diabetes mellitus with diabetic chronic kidney disease: Secondary | ICD-10-CM | POA: Diagnosis not present

## 2023-02-04 DIAGNOSIS — D631 Anemia in chronic kidney disease: Secondary | ICD-10-CM | POA: Diagnosis not present

## 2023-02-07 DIAGNOSIS — D631 Anemia in chronic kidney disease: Secondary | ICD-10-CM | POA: Diagnosis not present

## 2023-02-07 DIAGNOSIS — N186 End stage renal disease: Secondary | ICD-10-CM | POA: Diagnosis not present

## 2023-02-07 DIAGNOSIS — D689 Coagulation defect, unspecified: Secondary | ICD-10-CM | POA: Diagnosis not present

## 2023-02-07 DIAGNOSIS — N2581 Secondary hyperparathyroidism of renal origin: Secondary | ICD-10-CM | POA: Diagnosis not present

## 2023-02-07 DIAGNOSIS — Z992 Dependence on renal dialysis: Secondary | ICD-10-CM | POA: Diagnosis not present

## 2023-02-07 DIAGNOSIS — E1022 Type 1 diabetes mellitus with diabetic chronic kidney disease: Secondary | ICD-10-CM | POA: Diagnosis not present

## 2023-02-07 DIAGNOSIS — D509 Iron deficiency anemia, unspecified: Secondary | ICD-10-CM | POA: Diagnosis not present

## 2023-02-09 DIAGNOSIS — Z992 Dependence on renal dialysis: Secondary | ICD-10-CM | POA: Diagnosis not present

## 2023-02-09 DIAGNOSIS — D631 Anemia in chronic kidney disease: Secondary | ICD-10-CM | POA: Diagnosis not present

## 2023-02-09 DIAGNOSIS — N2581 Secondary hyperparathyroidism of renal origin: Secondary | ICD-10-CM | POA: Diagnosis not present

## 2023-02-09 DIAGNOSIS — E1022 Type 1 diabetes mellitus with diabetic chronic kidney disease: Secondary | ICD-10-CM | POA: Diagnosis not present

## 2023-02-09 DIAGNOSIS — N186 End stage renal disease: Secondary | ICD-10-CM | POA: Diagnosis not present

## 2023-02-09 DIAGNOSIS — D689 Coagulation defect, unspecified: Secondary | ICD-10-CM | POA: Diagnosis not present

## 2023-02-09 DIAGNOSIS — D509 Iron deficiency anemia, unspecified: Secondary | ICD-10-CM | POA: Diagnosis not present

## 2023-02-11 DIAGNOSIS — D689 Coagulation defect, unspecified: Secondary | ICD-10-CM | POA: Diagnosis not present

## 2023-02-11 DIAGNOSIS — N186 End stage renal disease: Secondary | ICD-10-CM | POA: Diagnosis not present

## 2023-02-11 DIAGNOSIS — D509 Iron deficiency anemia, unspecified: Secondary | ICD-10-CM | POA: Diagnosis not present

## 2023-02-11 DIAGNOSIS — Z992 Dependence on renal dialysis: Secondary | ICD-10-CM | POA: Diagnosis not present

## 2023-02-11 DIAGNOSIS — E1022 Type 1 diabetes mellitus with diabetic chronic kidney disease: Secondary | ICD-10-CM | POA: Diagnosis not present

## 2023-02-11 DIAGNOSIS — D631 Anemia in chronic kidney disease: Secondary | ICD-10-CM | POA: Diagnosis not present

## 2023-02-11 DIAGNOSIS — N2581 Secondary hyperparathyroidism of renal origin: Secondary | ICD-10-CM | POA: Diagnosis not present

## 2023-02-14 DIAGNOSIS — N186 End stage renal disease: Secondary | ICD-10-CM | POA: Diagnosis not present

## 2023-02-14 DIAGNOSIS — E1022 Type 1 diabetes mellitus with diabetic chronic kidney disease: Secondary | ICD-10-CM | POA: Diagnosis not present

## 2023-02-14 DIAGNOSIS — D689 Coagulation defect, unspecified: Secondary | ICD-10-CM | POA: Diagnosis not present

## 2023-02-14 DIAGNOSIS — N2581 Secondary hyperparathyroidism of renal origin: Secondary | ICD-10-CM | POA: Diagnosis not present

## 2023-02-14 DIAGNOSIS — D631 Anemia in chronic kidney disease: Secondary | ICD-10-CM | POA: Diagnosis not present

## 2023-02-14 DIAGNOSIS — Z992 Dependence on renal dialysis: Secondary | ICD-10-CM | POA: Diagnosis not present

## 2023-02-14 DIAGNOSIS — D509 Iron deficiency anemia, unspecified: Secondary | ICD-10-CM | POA: Diagnosis not present

## 2023-02-16 DIAGNOSIS — N186 End stage renal disease: Secondary | ICD-10-CM | POA: Diagnosis not present

## 2023-02-16 DIAGNOSIS — D509 Iron deficiency anemia, unspecified: Secondary | ICD-10-CM | POA: Diagnosis not present

## 2023-02-16 DIAGNOSIS — E1022 Type 1 diabetes mellitus with diabetic chronic kidney disease: Secondary | ICD-10-CM | POA: Diagnosis not present

## 2023-02-16 DIAGNOSIS — N2581 Secondary hyperparathyroidism of renal origin: Secondary | ICD-10-CM | POA: Diagnosis not present

## 2023-02-16 DIAGNOSIS — D631 Anemia in chronic kidney disease: Secondary | ICD-10-CM | POA: Diagnosis not present

## 2023-02-16 DIAGNOSIS — D689 Coagulation defect, unspecified: Secondary | ICD-10-CM | POA: Diagnosis not present

## 2023-02-16 DIAGNOSIS — Z992 Dependence on renal dialysis: Secondary | ICD-10-CM | POA: Diagnosis not present

## 2023-02-18 DIAGNOSIS — Z992 Dependence on renal dialysis: Secondary | ICD-10-CM | POA: Diagnosis not present

## 2023-02-18 DIAGNOSIS — D509 Iron deficiency anemia, unspecified: Secondary | ICD-10-CM | POA: Diagnosis not present

## 2023-02-18 DIAGNOSIS — D689 Coagulation defect, unspecified: Secondary | ICD-10-CM | POA: Diagnosis not present

## 2023-02-18 DIAGNOSIS — N186 End stage renal disease: Secondary | ICD-10-CM | POA: Diagnosis not present

## 2023-02-18 DIAGNOSIS — N2581 Secondary hyperparathyroidism of renal origin: Secondary | ICD-10-CM | POA: Diagnosis not present

## 2023-02-18 DIAGNOSIS — E1022 Type 1 diabetes mellitus with diabetic chronic kidney disease: Secondary | ICD-10-CM | POA: Diagnosis not present

## 2023-02-18 DIAGNOSIS — D631 Anemia in chronic kidney disease: Secondary | ICD-10-CM | POA: Diagnosis not present

## 2023-02-21 DIAGNOSIS — D689 Coagulation defect, unspecified: Secondary | ICD-10-CM | POA: Diagnosis not present

## 2023-02-21 DIAGNOSIS — D631 Anemia in chronic kidney disease: Secondary | ICD-10-CM | POA: Diagnosis not present

## 2023-02-21 DIAGNOSIS — N2581 Secondary hyperparathyroidism of renal origin: Secondary | ICD-10-CM | POA: Diagnosis not present

## 2023-02-21 DIAGNOSIS — N186 End stage renal disease: Secondary | ICD-10-CM | POA: Diagnosis not present

## 2023-02-21 DIAGNOSIS — D509 Iron deficiency anemia, unspecified: Secondary | ICD-10-CM | POA: Diagnosis not present

## 2023-02-21 DIAGNOSIS — E1022 Type 1 diabetes mellitus with diabetic chronic kidney disease: Secondary | ICD-10-CM | POA: Diagnosis not present

## 2023-02-21 DIAGNOSIS — Z992 Dependence on renal dialysis: Secondary | ICD-10-CM | POA: Diagnosis not present

## 2023-02-24 DIAGNOSIS — N186 End stage renal disease: Secondary | ICD-10-CM | POA: Diagnosis not present

## 2023-02-24 DIAGNOSIS — E1022 Type 1 diabetes mellitus with diabetic chronic kidney disease: Secondary | ICD-10-CM | POA: Diagnosis not present

## 2023-02-24 DIAGNOSIS — Z992 Dependence on renal dialysis: Secondary | ICD-10-CM | POA: Diagnosis not present

## 2023-02-28 DIAGNOSIS — N2581 Secondary hyperparathyroidism of renal origin: Secondary | ICD-10-CM | POA: Diagnosis not present

## 2023-02-28 DIAGNOSIS — D689 Coagulation defect, unspecified: Secondary | ICD-10-CM | POA: Diagnosis not present

## 2023-02-28 DIAGNOSIS — N186 End stage renal disease: Secondary | ICD-10-CM | POA: Diagnosis not present

## 2023-02-28 DIAGNOSIS — D631 Anemia in chronic kidney disease: Secondary | ICD-10-CM | POA: Diagnosis not present

## 2023-02-28 DIAGNOSIS — E1022 Type 1 diabetes mellitus with diabetic chronic kidney disease: Secondary | ICD-10-CM | POA: Diagnosis not present

## 2023-02-28 DIAGNOSIS — Z992 Dependence on renal dialysis: Secondary | ICD-10-CM | POA: Diagnosis not present

## 2023-03-02 DIAGNOSIS — Z992 Dependence on renal dialysis: Secondary | ICD-10-CM | POA: Diagnosis not present

## 2023-03-02 DIAGNOSIS — N186 End stage renal disease: Secondary | ICD-10-CM | POA: Diagnosis not present

## 2023-03-02 DIAGNOSIS — N2581 Secondary hyperparathyroidism of renal origin: Secondary | ICD-10-CM | POA: Diagnosis not present

## 2023-03-02 DIAGNOSIS — E1022 Type 1 diabetes mellitus with diabetic chronic kidney disease: Secondary | ICD-10-CM | POA: Diagnosis not present

## 2023-03-02 DIAGNOSIS — D631 Anemia in chronic kidney disease: Secondary | ICD-10-CM | POA: Diagnosis not present

## 2023-03-02 DIAGNOSIS — D689 Coagulation defect, unspecified: Secondary | ICD-10-CM | POA: Diagnosis not present

## 2023-03-04 DIAGNOSIS — N2581 Secondary hyperparathyroidism of renal origin: Secondary | ICD-10-CM | POA: Diagnosis not present

## 2023-03-04 DIAGNOSIS — Z992 Dependence on renal dialysis: Secondary | ICD-10-CM | POA: Diagnosis not present

## 2023-03-04 DIAGNOSIS — D689 Coagulation defect, unspecified: Secondary | ICD-10-CM | POA: Diagnosis not present

## 2023-03-04 DIAGNOSIS — N186 End stage renal disease: Secondary | ICD-10-CM | POA: Diagnosis not present

## 2023-03-04 DIAGNOSIS — E1022 Type 1 diabetes mellitus with diabetic chronic kidney disease: Secondary | ICD-10-CM | POA: Diagnosis not present

## 2023-03-04 DIAGNOSIS — D631 Anemia in chronic kidney disease: Secondary | ICD-10-CM | POA: Diagnosis not present

## 2023-03-07 DIAGNOSIS — D689 Coagulation defect, unspecified: Secondary | ICD-10-CM | POA: Diagnosis not present

## 2023-03-07 DIAGNOSIS — D631 Anemia in chronic kidney disease: Secondary | ICD-10-CM | POA: Diagnosis not present

## 2023-03-07 DIAGNOSIS — N2581 Secondary hyperparathyroidism of renal origin: Secondary | ICD-10-CM | POA: Diagnosis not present

## 2023-03-07 DIAGNOSIS — Z992 Dependence on renal dialysis: Secondary | ICD-10-CM | POA: Diagnosis not present

## 2023-03-07 DIAGNOSIS — E1022 Type 1 diabetes mellitus with diabetic chronic kidney disease: Secondary | ICD-10-CM | POA: Diagnosis not present

## 2023-03-07 DIAGNOSIS — N186 End stage renal disease: Secondary | ICD-10-CM | POA: Diagnosis not present

## 2023-03-09 DIAGNOSIS — Z992 Dependence on renal dialysis: Secondary | ICD-10-CM | POA: Diagnosis not present

## 2023-03-09 DIAGNOSIS — D631 Anemia in chronic kidney disease: Secondary | ICD-10-CM | POA: Diagnosis not present

## 2023-03-09 DIAGNOSIS — D689 Coagulation defect, unspecified: Secondary | ICD-10-CM | POA: Diagnosis not present

## 2023-03-09 DIAGNOSIS — N2581 Secondary hyperparathyroidism of renal origin: Secondary | ICD-10-CM | POA: Diagnosis not present

## 2023-03-09 DIAGNOSIS — N186 End stage renal disease: Secondary | ICD-10-CM | POA: Diagnosis not present

## 2023-03-09 DIAGNOSIS — E1022 Type 1 diabetes mellitus with diabetic chronic kidney disease: Secondary | ICD-10-CM | POA: Diagnosis not present

## 2023-03-11 DIAGNOSIS — N186 End stage renal disease: Secondary | ICD-10-CM | POA: Diagnosis not present

## 2023-03-11 DIAGNOSIS — D689 Coagulation defect, unspecified: Secondary | ICD-10-CM | POA: Diagnosis not present

## 2023-03-11 DIAGNOSIS — Z992 Dependence on renal dialysis: Secondary | ICD-10-CM | POA: Diagnosis not present

## 2023-03-11 DIAGNOSIS — E1022 Type 1 diabetes mellitus with diabetic chronic kidney disease: Secondary | ICD-10-CM | POA: Diagnosis not present

## 2023-03-11 DIAGNOSIS — D631 Anemia in chronic kidney disease: Secondary | ICD-10-CM | POA: Diagnosis not present

## 2023-03-11 DIAGNOSIS — N2581 Secondary hyperparathyroidism of renal origin: Secondary | ICD-10-CM | POA: Diagnosis not present

## 2023-03-14 DIAGNOSIS — N186 End stage renal disease: Secondary | ICD-10-CM | POA: Diagnosis not present

## 2023-03-14 DIAGNOSIS — Z992 Dependence on renal dialysis: Secondary | ICD-10-CM | POA: Diagnosis not present

## 2023-03-14 DIAGNOSIS — E1022 Type 1 diabetes mellitus with diabetic chronic kidney disease: Secondary | ICD-10-CM | POA: Diagnosis not present

## 2023-03-14 DIAGNOSIS — N2581 Secondary hyperparathyroidism of renal origin: Secondary | ICD-10-CM | POA: Diagnosis not present

## 2023-03-14 DIAGNOSIS — D689 Coagulation defect, unspecified: Secondary | ICD-10-CM | POA: Diagnosis not present

## 2023-03-14 DIAGNOSIS — D631 Anemia in chronic kidney disease: Secondary | ICD-10-CM | POA: Diagnosis not present

## 2023-03-16 DIAGNOSIS — N186 End stage renal disease: Secondary | ICD-10-CM | POA: Diagnosis not present

## 2023-03-16 DIAGNOSIS — Z992 Dependence on renal dialysis: Secondary | ICD-10-CM | POA: Diagnosis not present

## 2023-03-16 DIAGNOSIS — D689 Coagulation defect, unspecified: Secondary | ICD-10-CM | POA: Diagnosis not present

## 2023-03-16 DIAGNOSIS — D631 Anemia in chronic kidney disease: Secondary | ICD-10-CM | POA: Diagnosis not present

## 2023-03-16 DIAGNOSIS — E1022 Type 1 diabetes mellitus with diabetic chronic kidney disease: Secondary | ICD-10-CM | POA: Diagnosis not present

## 2023-03-16 DIAGNOSIS — N2581 Secondary hyperparathyroidism of renal origin: Secondary | ICD-10-CM | POA: Diagnosis not present

## 2023-03-18 DIAGNOSIS — N2581 Secondary hyperparathyroidism of renal origin: Secondary | ICD-10-CM | POA: Diagnosis not present

## 2023-03-18 DIAGNOSIS — D689 Coagulation defect, unspecified: Secondary | ICD-10-CM | POA: Diagnosis not present

## 2023-03-18 DIAGNOSIS — Z992 Dependence on renal dialysis: Secondary | ICD-10-CM | POA: Diagnosis not present

## 2023-03-18 DIAGNOSIS — N186 End stage renal disease: Secondary | ICD-10-CM | POA: Diagnosis not present

## 2023-03-18 DIAGNOSIS — E1022 Type 1 diabetes mellitus with diabetic chronic kidney disease: Secondary | ICD-10-CM | POA: Diagnosis not present

## 2023-03-18 DIAGNOSIS — D631 Anemia in chronic kidney disease: Secondary | ICD-10-CM | POA: Diagnosis not present

## 2023-03-21 DIAGNOSIS — E1022 Type 1 diabetes mellitus with diabetic chronic kidney disease: Secondary | ICD-10-CM | POA: Diagnosis not present

## 2023-03-21 DIAGNOSIS — D631 Anemia in chronic kidney disease: Secondary | ICD-10-CM | POA: Diagnosis not present

## 2023-03-21 DIAGNOSIS — N2581 Secondary hyperparathyroidism of renal origin: Secondary | ICD-10-CM | POA: Diagnosis not present

## 2023-03-21 DIAGNOSIS — Z992 Dependence on renal dialysis: Secondary | ICD-10-CM | POA: Diagnosis not present

## 2023-03-21 DIAGNOSIS — D689 Coagulation defect, unspecified: Secondary | ICD-10-CM | POA: Diagnosis not present

## 2023-03-21 DIAGNOSIS — N186 End stage renal disease: Secondary | ICD-10-CM | POA: Diagnosis not present

## 2023-03-23 DIAGNOSIS — D631 Anemia in chronic kidney disease: Secondary | ICD-10-CM | POA: Diagnosis not present

## 2023-03-23 DIAGNOSIS — E1022 Type 1 diabetes mellitus with diabetic chronic kidney disease: Secondary | ICD-10-CM | POA: Diagnosis not present

## 2023-03-23 DIAGNOSIS — D689 Coagulation defect, unspecified: Secondary | ICD-10-CM | POA: Diagnosis not present

## 2023-03-23 DIAGNOSIS — N2581 Secondary hyperparathyroidism of renal origin: Secondary | ICD-10-CM | POA: Diagnosis not present

## 2023-03-23 DIAGNOSIS — N186 End stage renal disease: Secondary | ICD-10-CM | POA: Diagnosis not present

## 2023-03-23 DIAGNOSIS — Z992 Dependence on renal dialysis: Secondary | ICD-10-CM | POA: Diagnosis not present

## 2023-03-24 DIAGNOSIS — E1022 Type 1 diabetes mellitus with diabetic chronic kidney disease: Secondary | ICD-10-CM | POA: Diagnosis not present

## 2023-03-24 DIAGNOSIS — Z992 Dependence on renal dialysis: Secondary | ICD-10-CM | POA: Diagnosis not present

## 2023-03-24 DIAGNOSIS — N186 End stage renal disease: Secondary | ICD-10-CM | POA: Diagnosis not present

## 2023-03-25 DIAGNOSIS — Z992 Dependence on renal dialysis: Secondary | ICD-10-CM | POA: Diagnosis not present

## 2023-03-25 DIAGNOSIS — N186 End stage renal disease: Secondary | ICD-10-CM | POA: Diagnosis not present

## 2023-03-25 DIAGNOSIS — D689 Coagulation defect, unspecified: Secondary | ICD-10-CM | POA: Diagnosis not present

## 2023-03-25 DIAGNOSIS — E1022 Type 1 diabetes mellitus with diabetic chronic kidney disease: Secondary | ICD-10-CM | POA: Diagnosis not present

## 2023-03-25 DIAGNOSIS — N2581 Secondary hyperparathyroidism of renal origin: Secondary | ICD-10-CM | POA: Diagnosis not present

## 2023-03-25 DIAGNOSIS — D631 Anemia in chronic kidney disease: Secondary | ICD-10-CM | POA: Diagnosis not present

## 2023-03-28 DIAGNOSIS — N186 End stage renal disease: Secondary | ICD-10-CM | POA: Diagnosis not present

## 2023-03-28 DIAGNOSIS — Z992 Dependence on renal dialysis: Secondary | ICD-10-CM | POA: Diagnosis not present

## 2023-03-28 DIAGNOSIS — N2581 Secondary hyperparathyroidism of renal origin: Secondary | ICD-10-CM | POA: Diagnosis not present

## 2023-03-28 DIAGNOSIS — D689 Coagulation defect, unspecified: Secondary | ICD-10-CM | POA: Diagnosis not present

## 2023-03-28 DIAGNOSIS — E1022 Type 1 diabetes mellitus with diabetic chronic kidney disease: Secondary | ICD-10-CM | POA: Diagnosis not present

## 2023-03-28 DIAGNOSIS — D631 Anemia in chronic kidney disease: Secondary | ICD-10-CM | POA: Diagnosis not present

## 2023-03-30 DIAGNOSIS — D631 Anemia in chronic kidney disease: Secondary | ICD-10-CM | POA: Diagnosis not present

## 2023-03-30 DIAGNOSIS — D689 Coagulation defect, unspecified: Secondary | ICD-10-CM | POA: Diagnosis not present

## 2023-03-30 DIAGNOSIS — E1022 Type 1 diabetes mellitus with diabetic chronic kidney disease: Secondary | ICD-10-CM | POA: Diagnosis not present

## 2023-03-30 DIAGNOSIS — N186 End stage renal disease: Secondary | ICD-10-CM | POA: Diagnosis not present

## 2023-03-30 DIAGNOSIS — N2581 Secondary hyperparathyroidism of renal origin: Secondary | ICD-10-CM | POA: Diagnosis not present

## 2023-03-30 DIAGNOSIS — Z992 Dependence on renal dialysis: Secondary | ICD-10-CM | POA: Diagnosis not present

## 2023-04-01 DIAGNOSIS — Z992 Dependence on renal dialysis: Secondary | ICD-10-CM | POA: Diagnosis not present

## 2023-04-01 DIAGNOSIS — D689 Coagulation defect, unspecified: Secondary | ICD-10-CM | POA: Diagnosis not present

## 2023-04-01 DIAGNOSIS — N186 End stage renal disease: Secondary | ICD-10-CM | POA: Diagnosis not present

## 2023-04-01 DIAGNOSIS — D631 Anemia in chronic kidney disease: Secondary | ICD-10-CM | POA: Diagnosis not present

## 2023-04-01 DIAGNOSIS — N2581 Secondary hyperparathyroidism of renal origin: Secondary | ICD-10-CM | POA: Diagnosis not present

## 2023-04-01 DIAGNOSIS — E1022 Type 1 diabetes mellitus with diabetic chronic kidney disease: Secondary | ICD-10-CM | POA: Diagnosis not present

## 2023-04-04 DIAGNOSIS — D689 Coagulation defect, unspecified: Secondary | ICD-10-CM | POA: Diagnosis not present

## 2023-04-04 DIAGNOSIS — D631 Anemia in chronic kidney disease: Secondary | ICD-10-CM | POA: Diagnosis not present

## 2023-04-04 DIAGNOSIS — N2581 Secondary hyperparathyroidism of renal origin: Secondary | ICD-10-CM | POA: Diagnosis not present

## 2023-04-04 DIAGNOSIS — Z992 Dependence on renal dialysis: Secondary | ICD-10-CM | POA: Diagnosis not present

## 2023-04-04 DIAGNOSIS — N186 End stage renal disease: Secondary | ICD-10-CM | POA: Diagnosis not present

## 2023-04-04 DIAGNOSIS — E1022 Type 1 diabetes mellitus with diabetic chronic kidney disease: Secondary | ICD-10-CM | POA: Diagnosis not present

## 2023-04-06 DIAGNOSIS — Z992 Dependence on renal dialysis: Secondary | ICD-10-CM | POA: Diagnosis not present

## 2023-04-06 DIAGNOSIS — D689 Coagulation defect, unspecified: Secondary | ICD-10-CM | POA: Diagnosis not present

## 2023-04-06 DIAGNOSIS — E1022 Type 1 diabetes mellitus with diabetic chronic kidney disease: Secondary | ICD-10-CM | POA: Diagnosis not present

## 2023-04-06 DIAGNOSIS — N186 End stage renal disease: Secondary | ICD-10-CM | POA: Diagnosis not present

## 2023-04-06 DIAGNOSIS — N2581 Secondary hyperparathyroidism of renal origin: Secondary | ICD-10-CM | POA: Diagnosis not present

## 2023-04-06 DIAGNOSIS — D631 Anemia in chronic kidney disease: Secondary | ICD-10-CM | POA: Diagnosis not present

## 2023-04-08 DIAGNOSIS — N186 End stage renal disease: Secondary | ICD-10-CM | POA: Diagnosis not present

## 2023-04-08 DIAGNOSIS — Z992 Dependence on renal dialysis: Secondary | ICD-10-CM | POA: Diagnosis not present

## 2023-04-08 DIAGNOSIS — D631 Anemia in chronic kidney disease: Secondary | ICD-10-CM | POA: Diagnosis not present

## 2023-04-08 DIAGNOSIS — N2581 Secondary hyperparathyroidism of renal origin: Secondary | ICD-10-CM | POA: Diagnosis not present

## 2023-04-08 DIAGNOSIS — E1022 Type 1 diabetes mellitus with diabetic chronic kidney disease: Secondary | ICD-10-CM | POA: Diagnosis not present

## 2023-04-08 DIAGNOSIS — D689 Coagulation defect, unspecified: Secondary | ICD-10-CM | POA: Diagnosis not present

## 2023-04-11 DIAGNOSIS — N186 End stage renal disease: Secondary | ICD-10-CM | POA: Diagnosis not present

## 2023-04-11 DIAGNOSIS — N2581 Secondary hyperparathyroidism of renal origin: Secondary | ICD-10-CM | POA: Diagnosis not present

## 2023-04-11 DIAGNOSIS — Z992 Dependence on renal dialysis: Secondary | ICD-10-CM | POA: Diagnosis not present

## 2023-04-11 DIAGNOSIS — E1022 Type 1 diabetes mellitus with diabetic chronic kidney disease: Secondary | ICD-10-CM | POA: Diagnosis not present

## 2023-04-11 DIAGNOSIS — D631 Anemia in chronic kidney disease: Secondary | ICD-10-CM | POA: Diagnosis not present

## 2023-04-11 DIAGNOSIS — D689 Coagulation defect, unspecified: Secondary | ICD-10-CM | POA: Diagnosis not present

## 2023-04-15 DIAGNOSIS — N2581 Secondary hyperparathyroidism of renal origin: Secondary | ICD-10-CM | POA: Diagnosis not present

## 2023-04-15 DIAGNOSIS — N186 End stage renal disease: Secondary | ICD-10-CM | POA: Diagnosis not present

## 2023-04-15 DIAGNOSIS — E1022 Type 1 diabetes mellitus with diabetic chronic kidney disease: Secondary | ICD-10-CM | POA: Diagnosis not present

## 2023-04-15 DIAGNOSIS — Z992 Dependence on renal dialysis: Secondary | ICD-10-CM | POA: Diagnosis not present

## 2023-04-15 DIAGNOSIS — D689 Coagulation defect, unspecified: Secondary | ICD-10-CM | POA: Diagnosis not present

## 2023-04-15 DIAGNOSIS — D631 Anemia in chronic kidney disease: Secondary | ICD-10-CM | POA: Diagnosis not present

## 2023-04-18 DIAGNOSIS — D689 Coagulation defect, unspecified: Secondary | ICD-10-CM | POA: Diagnosis not present

## 2023-04-18 DIAGNOSIS — D631 Anemia in chronic kidney disease: Secondary | ICD-10-CM | POA: Diagnosis not present

## 2023-04-18 DIAGNOSIS — Z992 Dependence on renal dialysis: Secondary | ICD-10-CM | POA: Diagnosis not present

## 2023-04-18 DIAGNOSIS — N186 End stage renal disease: Secondary | ICD-10-CM | POA: Diagnosis not present

## 2023-04-18 DIAGNOSIS — E1022 Type 1 diabetes mellitus with diabetic chronic kidney disease: Secondary | ICD-10-CM | POA: Diagnosis not present

## 2023-04-18 DIAGNOSIS — N2581 Secondary hyperparathyroidism of renal origin: Secondary | ICD-10-CM | POA: Diagnosis not present

## 2023-04-20 DIAGNOSIS — N186 End stage renal disease: Secondary | ICD-10-CM | POA: Diagnosis not present

## 2023-04-20 DIAGNOSIS — N2581 Secondary hyperparathyroidism of renal origin: Secondary | ICD-10-CM | POA: Diagnosis not present

## 2023-04-20 DIAGNOSIS — E1022 Type 1 diabetes mellitus with diabetic chronic kidney disease: Secondary | ICD-10-CM | POA: Diagnosis not present

## 2023-04-20 DIAGNOSIS — D631 Anemia in chronic kidney disease: Secondary | ICD-10-CM | POA: Diagnosis not present

## 2023-04-20 DIAGNOSIS — Z992 Dependence on renal dialysis: Secondary | ICD-10-CM | POA: Diagnosis not present

## 2023-04-20 DIAGNOSIS — D689 Coagulation defect, unspecified: Secondary | ICD-10-CM | POA: Diagnosis not present

## 2023-04-22 DIAGNOSIS — E1022 Type 1 diabetes mellitus with diabetic chronic kidney disease: Secondary | ICD-10-CM | POA: Diagnosis not present

## 2023-04-22 DIAGNOSIS — D689 Coagulation defect, unspecified: Secondary | ICD-10-CM | POA: Diagnosis not present

## 2023-04-22 DIAGNOSIS — Z992 Dependence on renal dialysis: Secondary | ICD-10-CM | POA: Diagnosis not present

## 2023-04-22 DIAGNOSIS — D631 Anemia in chronic kidney disease: Secondary | ICD-10-CM | POA: Diagnosis not present

## 2023-04-22 DIAGNOSIS — N186 End stage renal disease: Secondary | ICD-10-CM | POA: Diagnosis not present

## 2023-04-22 DIAGNOSIS — N2581 Secondary hyperparathyroidism of renal origin: Secondary | ICD-10-CM | POA: Diagnosis not present

## 2023-04-24 DIAGNOSIS — E1022 Type 1 diabetes mellitus with diabetic chronic kidney disease: Secondary | ICD-10-CM | POA: Diagnosis not present

## 2023-04-24 DIAGNOSIS — Z992 Dependence on renal dialysis: Secondary | ICD-10-CM | POA: Diagnosis not present

## 2023-04-24 DIAGNOSIS — N186 End stage renal disease: Secondary | ICD-10-CM | POA: Diagnosis not present

## 2023-04-25 DIAGNOSIS — D631 Anemia in chronic kidney disease: Secondary | ICD-10-CM | POA: Diagnosis not present

## 2023-04-25 DIAGNOSIS — Z992 Dependence on renal dialysis: Secondary | ICD-10-CM | POA: Diagnosis not present

## 2023-04-25 DIAGNOSIS — N2581 Secondary hyperparathyroidism of renal origin: Secondary | ICD-10-CM | POA: Diagnosis not present

## 2023-04-25 DIAGNOSIS — D509 Iron deficiency anemia, unspecified: Secondary | ICD-10-CM | POA: Diagnosis not present

## 2023-04-25 DIAGNOSIS — D689 Coagulation defect, unspecified: Secondary | ICD-10-CM | POA: Diagnosis not present

## 2023-04-25 DIAGNOSIS — N186 End stage renal disease: Secondary | ICD-10-CM | POA: Diagnosis not present

## 2023-04-27 DIAGNOSIS — D631 Anemia in chronic kidney disease: Secondary | ICD-10-CM | POA: Diagnosis not present

## 2023-04-27 DIAGNOSIS — D689 Coagulation defect, unspecified: Secondary | ICD-10-CM | POA: Diagnosis not present

## 2023-04-27 DIAGNOSIS — Z992 Dependence on renal dialysis: Secondary | ICD-10-CM | POA: Diagnosis not present

## 2023-04-27 DIAGNOSIS — D509 Iron deficiency anemia, unspecified: Secondary | ICD-10-CM | POA: Diagnosis not present

## 2023-04-27 DIAGNOSIS — N186 End stage renal disease: Secondary | ICD-10-CM | POA: Diagnosis not present

## 2023-04-27 DIAGNOSIS — N2581 Secondary hyperparathyroidism of renal origin: Secondary | ICD-10-CM | POA: Diagnosis not present

## 2023-04-29 DIAGNOSIS — N186 End stage renal disease: Secondary | ICD-10-CM | POA: Diagnosis not present

## 2023-04-29 DIAGNOSIS — N2581 Secondary hyperparathyroidism of renal origin: Secondary | ICD-10-CM | POA: Diagnosis not present

## 2023-04-29 DIAGNOSIS — D631 Anemia in chronic kidney disease: Secondary | ICD-10-CM | POA: Diagnosis not present

## 2023-04-29 DIAGNOSIS — D509 Iron deficiency anemia, unspecified: Secondary | ICD-10-CM | POA: Diagnosis not present

## 2023-04-29 DIAGNOSIS — Z992 Dependence on renal dialysis: Secondary | ICD-10-CM | POA: Diagnosis not present

## 2023-04-29 DIAGNOSIS — D689 Coagulation defect, unspecified: Secondary | ICD-10-CM | POA: Diagnosis not present

## 2023-05-02 DIAGNOSIS — D689 Coagulation defect, unspecified: Secondary | ICD-10-CM | POA: Diagnosis not present

## 2023-05-02 DIAGNOSIS — D631 Anemia in chronic kidney disease: Secondary | ICD-10-CM | POA: Diagnosis not present

## 2023-05-02 DIAGNOSIS — Z992 Dependence on renal dialysis: Secondary | ICD-10-CM | POA: Diagnosis not present

## 2023-05-02 DIAGNOSIS — D509 Iron deficiency anemia, unspecified: Secondary | ICD-10-CM | POA: Diagnosis not present

## 2023-05-02 DIAGNOSIS — N186 End stage renal disease: Secondary | ICD-10-CM | POA: Diagnosis not present

## 2023-05-02 DIAGNOSIS — N2581 Secondary hyperparathyroidism of renal origin: Secondary | ICD-10-CM | POA: Diagnosis not present

## 2023-05-04 DIAGNOSIS — N2581 Secondary hyperparathyroidism of renal origin: Secondary | ICD-10-CM | POA: Diagnosis not present

## 2023-05-04 DIAGNOSIS — D509 Iron deficiency anemia, unspecified: Secondary | ICD-10-CM | POA: Diagnosis not present

## 2023-05-04 DIAGNOSIS — Z992 Dependence on renal dialysis: Secondary | ICD-10-CM | POA: Diagnosis not present

## 2023-05-04 DIAGNOSIS — D689 Coagulation defect, unspecified: Secondary | ICD-10-CM | POA: Diagnosis not present

## 2023-05-04 DIAGNOSIS — N186 End stage renal disease: Secondary | ICD-10-CM | POA: Diagnosis not present

## 2023-05-04 DIAGNOSIS — D631 Anemia in chronic kidney disease: Secondary | ICD-10-CM | POA: Diagnosis not present

## 2023-05-05 ENCOUNTER — Other Ambulatory Visit: Payer: Self-pay

## 2023-05-05 ENCOUNTER — Encounter (HOSPITAL_COMMUNITY): Payer: Self-pay | Admitting: Nephrology

## 2023-05-05 ENCOUNTER — Encounter (HOSPITAL_COMMUNITY)
Admission: AD | Disposition: A | Discharge status: Home / Self Care | Payer: Self-pay | Source: Ambulatory Visit | Attending: Nephrology

## 2023-05-05 ENCOUNTER — Ambulatory Visit (HOSPITAL_COMMUNITY)
Admission: AD | Admit: 2023-05-05 | Discharge: 2023-05-05 | Disposition: A | Discharge status: Home / Self Care | Source: Ambulatory Visit | Attending: Nephrology | Admitting: Nephrology

## 2023-05-05 DIAGNOSIS — T8241XA Breakdown (mechanical) of vascular dialysis catheter, initial encounter: Secondary | ICD-10-CM | POA: Insufficient documentation

## 2023-05-05 DIAGNOSIS — I120 Hypertensive chronic kidney disease with stage 5 chronic kidney disease or end stage renal disease: Secondary | ICD-10-CM | POA: Diagnosis not present

## 2023-05-05 DIAGNOSIS — Z79899 Other long term (current) drug therapy: Secondary | ICD-10-CM | POA: Insufficient documentation

## 2023-05-05 DIAGNOSIS — Z992 Dependence on renal dialysis: Secondary | ICD-10-CM | POA: Diagnosis not present

## 2023-05-05 DIAGNOSIS — Y839 Surgical procedure, unspecified as the cause of abnormal reaction of the patient, or of later complication, without mention of misadventure at the time of the procedure: Secondary | ICD-10-CM | POA: Diagnosis not present

## 2023-05-05 DIAGNOSIS — G8929 Other chronic pain: Secondary | ICD-10-CM | POA: Insufficient documentation

## 2023-05-05 DIAGNOSIS — N186 End stage renal disease: Secondary | ICD-10-CM | POA: Insufficient documentation

## 2023-05-05 DIAGNOSIS — D631 Anemia in chronic kidney disease: Secondary | ICD-10-CM | POA: Diagnosis not present

## 2023-05-05 DIAGNOSIS — Z87891 Personal history of nicotine dependence: Secondary | ICD-10-CM | POA: Diagnosis not present

## 2023-05-05 DIAGNOSIS — Z8673 Personal history of transient ischemic attack (TIA), and cerebral infarction without residual deficits: Secondary | ICD-10-CM | POA: Diagnosis not present

## 2023-05-05 HISTORY — PX: TUNNELLED CATHETER EXCHANGE: CATH118373

## 2023-05-05 SURGERY — TUNNELLED CATHETER EXCHANGE

## 2023-05-05 MED ORDER — HEPARIN SODIUM (PORCINE) 1000 UNIT/ML IJ SOLN
INTRAMUSCULAR | Status: DC | PRN
  Administered 2023-05-05: 3200 [IU] via INTRAVENOUS

## 2023-05-05 MED ORDER — MIDAZOLAM HCL 2 MG/2ML IJ SOLN
INTRAMUSCULAR | Status: AC
  Filled 2023-05-05: qty 2

## 2023-05-05 MED ORDER — HEPARIN SODIUM (PORCINE) 1000 UNIT/ML IJ SOLN
INTRAMUSCULAR | Status: AC
  Filled 2023-05-05: qty 10

## 2023-05-05 MED ORDER — IODIXANOL 320 MG/ML IV SOLN
INTRAVENOUS | Status: DC | PRN
  Administered 2023-05-05: 2 mL via INTRAVENOUS

## 2023-05-05 MED ORDER — FENTANYL CITRATE (PF) 100 MCG/2ML IJ SOLN
INTRAMUSCULAR | Status: DC | PRN
  Administered 2023-05-05 (×2): 25 ug via INTRAVENOUS

## 2023-05-05 MED ORDER — LIDOCAINE HCL (PF) 1 % IJ SOLN
INTRAMUSCULAR | Status: AC
  Filled 2023-05-05: qty 30

## 2023-05-05 MED ORDER — MIDAZOLAM HCL 2 MG/2ML IJ SOLN
INTRAMUSCULAR | Status: DC | PRN
  Administered 2023-05-05: 1 mg via INTRAVENOUS

## 2023-05-05 MED ORDER — LIDOCAINE HCL (PF) 1 % IJ SOLN
INTRAMUSCULAR | Status: DC | PRN
  Administered 2023-05-05: 5 mL via SUBCUTANEOUS

## 2023-05-05 MED ORDER — HEPARIN (PORCINE) IN NACL 1000-0.9 UT/500ML-% IV SOLN
INTRAVENOUS | Status: DC | PRN
  Administered 2023-05-05: 500 mL

## 2023-05-05 MED ORDER — SODIUM CHLORIDE 0.9 % IV SOLN: INTRAVENOUS | Status: DC

## 2023-05-05 MED ORDER — FENTANYL CITRATE (PF) 100 MCG/2ML IJ SOLN
INTRAMUSCULAR | Status: AC
  Filled 2023-05-05: qty 2

## 2023-05-05 SURGICAL SUPPLY — 4 items
BAG SNAP BAND KOVER 36X36 (MISCELLANEOUS) IMPLANT
CATH PALINDROME 19 SP (CATHETERS) IMPLANT
GLIDEWIRE STIFF .35X180X3 HYDR (WIRE) IMPLANT
PACK EP LF (CUSTOM PROCEDURE TRAY) IMPLANT

## 2023-05-05 NOTE — Op Note (Addendum)
 Patient presents with a fractured venous hub of the right IJ tunneled hemodialysis catheter (19 cm cuff to tip- Palindrome) that was placed 6 months ago. On examination, aspiration from both ports is good and chest x-ray confirms the catheter tip is appropriately positioned in the RA. The catheter cuff is 1/2cm deep to the exit site.    Summary:  1) The patient had a successful 19 cm CTT Palindrome hemodialysis catheter exchange in the right internal jugular vein. 2) No sheath noted through either port. 3) Okay to use catheter immediately.  Description of procedure: The right neck, chest and the catheter were prepped and draped in the usual sterile fashion. The exit site and adjacent tunnel tract were anesthetized with lidocaine  1% with epinephrine . The cuff was dissected free with a curved Kelly and manual traction. The catheter was withdrawn and venogram through each of the ports was performed; there was no fibrin sheath evident through either port.   A hydrophilic wire was manipulated and advanced through the arterial port and the tip was parked in the IVC. The catheter was completely removed and noted to be entirely intact. A new 19 cm cuff to tip Palindrome catheter was inserted over the guidewire and the tip parked in the right atrium and at that point the cuff was approximately 1 cm deep to the exit site.   Aspiration and flushing of both limbs of the catheter confirmed excellent flow. No kinks were visible on fluoroscopic imaging. Both limbs of the catheter were locked with citrate and sterile caps were placed.   The hub was secured on to the chest wall with 2-0 nylon wing sutures.  Sterile dressings were placed, and the patient returned to recovery in stable condition.  Sedation: 1 mg Versed , 50 mcg Fentanyl  Sedation time: 26 minutes  Contrast: 2 mL  Monitoring: Because of the patient's comorbid conditions and sedation during the procedure, continuous EKG monitoring and O2 saturation  monitoring was performed throughout the procedure by the RN. There were no abnormal arrhythmias encountered.  Complications: None.  Diagnoses:   T82.41XA breakdown (mechanical) of vascular dialysis catheter.  (Fractured venous hub) N18.6 End stage renal disease  Z99.2 Dialysis dependence  Procedures Coding:  36581 Tunneled catheter exchange 77001  Fluoroscopy guidance for catheter exchange. Z3086 Contrast  Recommendations: Remove the suture in 3 weeks. 2.   Report any blood flow problems to [facility].  Discharge: The patient was discharged home in stable condition. The patient was given education regarding the care of the catheter and specific instructions in case of any problems.

## 2023-05-05 NOTE — H&P (Signed)
 Judy Chang is an 73 y.o. female.   Chief Complaint: Cracked venous hub of dialysis catheter HPI:  73 year old woman with past medical history significant for hypertension, chronic back pain secondary to DDD, spinal stenosis, history of CVA, chondromalacia, osteoarthritis and end-stage renal disease on hemodialysis.  She has a right IJ tunneled dialysis catheter that was placed about 8 months ago (October 2024) and recently had breakage of her venous hub of the dialysis catheter.  She is referred for catheter exchange.  She denies any fever or chills and does not have any chest pain or shortness of breath.  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   pt unaware   Anxiety    Arthritis    Avascular necrosis (HCC)    Chronic left femoral head   Benign essential HTN 10/23/2015   Chondromalacia 02/25/2018   Noted on MRI: Mild   DDD (degenerative disc disease), lumbar 01/10/2011   Noted on MRI   Dementia [facility])    patient denies   Depression    DJD (degenerative joint disease)    GERD (gastroesophageal reflux disease)    History of cataract    Bilateral   Hypercholesterolemia    Hypertension    Labial tear 02/25/2018   Noted on MRI: smal Left anterior    Lumbar spondylosis 01/10/2011   Noted on MRI   Migraines    Renal cyst 11/17/2015   Notedon [place] Abd: Simple, Right   Seizures (HCC)    Spinal stenosis 09/06/2016   Noted on MRI: Moderate-Severe   Stroke (HCC)    Vitreous floater, bilateral     Past Surgical History:  Procedure Laterality Date   CAPD INSERTION N/A 10/13/2022   Procedure: LAPAROSCOPIC INSERTION CONTINUOUS AMBULATORY PERITONEAL DIALYSIS  (CAPD) CATHETER;  Surgeon: Leonie Douglas, MD;  Location: [facility] OR;  Service: Vascular;  Laterality: N/A;   CAPD REMOVAL N/A 11/23/2022   Procedure: LAPAROSCOPIC REMOVAL CONTINUOUS AMBULATORY PERITONEAL DIALYSIS  (CAPD) CATHETER;  Surgeon: Leonie Douglas, MD;  Location: [facility] OR;   Service: Vascular;  Laterality: N/A;   COLONOSCOPY     INSERTION OF DIALYSIS CATHETER Right 11/23/2022   Procedure: INSERTION OF TUNNELED DIALYSIS CATHETER;  Surgeon: Leonie Douglas, MD;  Location: [facility] OR;  Service: Vascular;  Laterality: Right;   PARTIAL HYSTERECTOMY     ROTATOR CUFF REPAIR Left    TONSILLECTOMY     TOTAL HIP ARTHROPLASTY Left 06/22/2018   Procedure: TOTAL HIP ARTHROPLASTY ANTERIOR APPROACH;  Surgeon: Jodi Geralds, MD;  Location: [facility] ORS;  Service: Orthopedics;  Laterality: Left;    Family History  Problem Relation Age of Onset   Hypertension Mother    Hypertension Father    Social History:  reports that she has quit smoking. Her smoking use included cigarettes. She has never used smokeless tobacco. She reports that she does not currently use alcohol. She reports that she does not currently use drugs after having used the following drugs: Marijuana.  Allergies:  Allergies  Allergen Reactions   Citalopram Hydrobromide Hives and Rash   Nsaids Hives and Itching    Burning of the tongue    Pregabalin Itching    Medications Prior to Admission  Medication Sig Dispense Refill   amLODipine (NORVASC) 10 MG tablet Take 1 tablet (10 mg total) by mouth daily. 30 tablet 1   buPROPion (WELLBUTRIN XL) 150 MG 24 hr tablet Take 1 tablet (150 mg total) by mouth every morning.  carvedilol (COREG) 12.5 MG tablet Take 1 tablet (12.5 mg total) by mouth 2 (two) times daily with a meal. 60 tablet 2   cloNIDine (CATAPRES - DOSED IN MG/24 HR) 0.2 mg/24hr patch Place 1 patch (0.2 mg total) onto the skin once a week. 4 patch 12   DULoxetine (CYMBALTA) 30 MG capsule Take 90 mg by mouth daily.     gentamicin cream (GARAMYCIN) 0.1 % Apply 1 Application topically once a week.     lactulose (CHRONULAC) 10 GM/15ML solution Take 10 g by mouth daily as needed for mild constipation or moderate constipation.     levETIRAcetam (KEPPRA) 500 MG tablet Take 1 tablet (500 mg total) by mouth every 12  (twelve) hours. 60 tablet 2   Oxycodone HCl 20 MG TABS Take 1.5 tablets (30 mg total) by mouth every 6 (six) hours as needed. 15 tablet 0   sodium zirconium cyclosilicate (LOKELMA) 10 g PACK packet Take one 10 g packet and 8 ounces of water prior to going to dialysis tomorrow morning 1 packet 0   spironolactone (ALDACTONE) 25 MG tablet Take 25 mg by mouth daily.     traZODone (DESYREL) 100 MG tablet Take 100 mg by mouth at bedtime.      No results found for this or any previous visit (from the past 48 hours). No results found.  Review of Systems  All other systems reviewed and are negative.   Blood pressure (!) 163/67, pulse (!) 52, temperature 97.9 F (36.6 C), resp. rate 12, weight 44.5 kg, SpO2 97%. Physical Exam Vitals and nursing note reviewed.  Constitutional:      Appearance: Normal appearance. She is normal weight.  HENT:     Head: Normocephalic and atraumatic.     Nose: Nose normal.  Eyes:     Extraocular Movements: Extraocular movements intact.     Conjunctiva/sclera: Conjunctivae normal.  Cardiovascular:     Comments: Right IJ tunneled hemodialysis catheter in place with crack in the venous hub of the catheter Pulmonary:     Effort: Pulmonary effort is normal.     Breath sounds: Normal breath sounds.  Musculoskeletal:     Cervical back: Normal range of motion and neck supple.  Neurological:     Mental Status: She is alert.      Assessment/Plan 1.  Mechanical breakdown of vascular dialysis catheter: Will undertake exchange of tunneled dialysis catheter and replace her current catheter.  Consent obtained from patient after weighing risks and benefits of procedure. 2.  End-stage renal disease: Resume hemodialysis on TTS schedule following completion of procedure today. 3.  Hypertension: Blood pressures elevated and will be monitored with moderate sedation during procedure. 4.  Anemia: Hemoglobin and hematocrit monitoring as well as ESA to resume with outpatient  dialysis  Dagoberto Ligas, MD 05/05/2023, 10:50 AM

## 2023-05-09 DIAGNOSIS — Z992 Dependence on renal dialysis: Secondary | ICD-10-CM | POA: Diagnosis not present

## 2023-05-09 DIAGNOSIS — N2581 Secondary hyperparathyroidism of renal origin: Secondary | ICD-10-CM | POA: Diagnosis not present

## 2023-05-09 DIAGNOSIS — D689 Coagulation defect, unspecified: Secondary | ICD-10-CM | POA: Diagnosis not present

## 2023-05-09 DIAGNOSIS — D631 Anemia in chronic kidney disease: Secondary | ICD-10-CM | POA: Diagnosis not present

## 2023-05-09 DIAGNOSIS — D509 Iron deficiency anemia, unspecified: Secondary | ICD-10-CM | POA: Diagnosis not present

## 2023-05-09 DIAGNOSIS — N186 End stage renal disease: Secondary | ICD-10-CM | POA: Diagnosis not present

## 2023-05-11 DIAGNOSIS — D689 Coagulation defect, unspecified: Secondary | ICD-10-CM | POA: Diagnosis not present

## 2023-05-11 DIAGNOSIS — D509 Iron deficiency anemia, unspecified: Secondary | ICD-10-CM | POA: Diagnosis not present

## 2023-05-11 DIAGNOSIS — Z992 Dependence on renal dialysis: Secondary | ICD-10-CM | POA: Diagnosis not present

## 2023-05-11 DIAGNOSIS — N186 End stage renal disease: Secondary | ICD-10-CM | POA: Diagnosis not present

## 2023-05-11 DIAGNOSIS — N2581 Secondary hyperparathyroidism of renal origin: Secondary | ICD-10-CM | POA: Diagnosis not present

## 2023-05-11 DIAGNOSIS — D631 Anemia in chronic kidney disease: Secondary | ICD-10-CM | POA: Diagnosis not present

## 2023-05-13 DIAGNOSIS — D509 Iron deficiency anemia, unspecified: Secondary | ICD-10-CM | POA: Diagnosis not present

## 2023-05-13 DIAGNOSIS — N2581 Secondary hyperparathyroidism of renal origin: Secondary | ICD-10-CM | POA: Diagnosis not present

## 2023-05-13 DIAGNOSIS — N186 End stage renal disease: Secondary | ICD-10-CM | POA: Diagnosis not present

## 2023-05-13 DIAGNOSIS — D631 Anemia in chronic kidney disease: Secondary | ICD-10-CM | POA: Diagnosis not present

## 2023-05-13 DIAGNOSIS — D689 Coagulation defect, unspecified: Secondary | ICD-10-CM | POA: Diagnosis not present

## 2023-05-13 DIAGNOSIS — Z992 Dependence on renal dialysis: Secondary | ICD-10-CM | POA: Diagnosis not present

## 2023-05-16 DIAGNOSIS — Z992 Dependence on renal dialysis: Secondary | ICD-10-CM | POA: Diagnosis not present

## 2023-05-16 DIAGNOSIS — D689 Coagulation defect, unspecified: Secondary | ICD-10-CM | POA: Diagnosis not present

## 2023-05-16 DIAGNOSIS — N2581 Secondary hyperparathyroidism of renal origin: Secondary | ICD-10-CM | POA: Diagnosis not present

## 2023-05-16 DIAGNOSIS — D509 Iron deficiency anemia, unspecified: Secondary | ICD-10-CM | POA: Diagnosis not present

## 2023-05-16 DIAGNOSIS — D631 Anemia in chronic kidney disease: Secondary | ICD-10-CM | POA: Diagnosis not present

## 2023-05-16 DIAGNOSIS — N186 End stage renal disease: Secondary | ICD-10-CM | POA: Diagnosis not present

## 2023-05-18 DIAGNOSIS — D689 Coagulation defect, unspecified: Secondary | ICD-10-CM | POA: Diagnosis not present

## 2023-05-18 DIAGNOSIS — N186 End stage renal disease: Secondary | ICD-10-CM | POA: Diagnosis not present

## 2023-05-18 DIAGNOSIS — D631 Anemia in chronic kidney disease: Secondary | ICD-10-CM | POA: Diagnosis not present

## 2023-05-18 DIAGNOSIS — N2581 Secondary hyperparathyroidism of renal origin: Secondary | ICD-10-CM | POA: Diagnosis not present

## 2023-05-18 DIAGNOSIS — D509 Iron deficiency anemia, unspecified: Secondary | ICD-10-CM | POA: Diagnosis not present

## 2023-05-18 DIAGNOSIS — Z992 Dependence on renal dialysis: Secondary | ICD-10-CM | POA: Diagnosis not present

## 2023-05-20 DIAGNOSIS — D689 Coagulation defect, unspecified: Secondary | ICD-10-CM | POA: Diagnosis not present

## 2023-05-20 DIAGNOSIS — D631 Anemia in chronic kidney disease: Secondary | ICD-10-CM | POA: Diagnosis not present

## 2023-05-20 DIAGNOSIS — N186 End stage renal disease: Secondary | ICD-10-CM | POA: Diagnosis not present

## 2023-05-20 DIAGNOSIS — N2581 Secondary hyperparathyroidism of renal origin: Secondary | ICD-10-CM | POA: Diagnosis not present

## 2023-05-20 DIAGNOSIS — D509 Iron deficiency anemia, unspecified: Secondary | ICD-10-CM | POA: Diagnosis not present

## 2023-05-20 DIAGNOSIS — Z992 Dependence on renal dialysis: Secondary | ICD-10-CM | POA: Diagnosis not present

## 2023-05-23 DIAGNOSIS — N2581 Secondary hyperparathyroidism of renal origin: Secondary | ICD-10-CM | POA: Diagnosis not present

## 2023-05-23 DIAGNOSIS — N186 End stage renal disease: Secondary | ICD-10-CM | POA: Diagnosis not present

## 2023-05-23 DIAGNOSIS — D509 Iron deficiency anemia, unspecified: Secondary | ICD-10-CM | POA: Diagnosis not present

## 2023-05-23 DIAGNOSIS — Z992 Dependence on renal dialysis: Secondary | ICD-10-CM | POA: Diagnosis not present

## 2023-05-23 DIAGNOSIS — D631 Anemia in chronic kidney disease: Secondary | ICD-10-CM | POA: Diagnosis not present

## 2023-05-23 DIAGNOSIS — D689 Coagulation defect, unspecified: Secondary | ICD-10-CM | POA: Diagnosis not present

## 2023-05-24 DIAGNOSIS — Z992 Dependence on renal dialysis: Secondary | ICD-10-CM | POA: Diagnosis not present

## 2023-05-24 DIAGNOSIS — N186 End stage renal disease: Secondary | ICD-10-CM | POA: Diagnosis not present

## 2023-05-24 DIAGNOSIS — E1022 Type 1 diabetes mellitus with diabetic chronic kidney disease: Secondary | ICD-10-CM | POA: Diagnosis not present

## 2023-05-25 DIAGNOSIS — D689 Coagulation defect, unspecified: Secondary | ICD-10-CM | POA: Diagnosis not present

## 2023-05-25 DIAGNOSIS — N2581 Secondary hyperparathyroidism of renal origin: Secondary | ICD-10-CM | POA: Diagnosis not present

## 2023-05-25 DIAGNOSIS — E1022 Type 1 diabetes mellitus with diabetic chronic kidney disease: Secondary | ICD-10-CM | POA: Diagnosis not present

## 2023-05-25 DIAGNOSIS — Z992 Dependence on renal dialysis: Secondary | ICD-10-CM | POA: Diagnosis not present

## 2023-05-25 DIAGNOSIS — D509 Iron deficiency anemia, unspecified: Secondary | ICD-10-CM | POA: Diagnosis not present

## 2023-05-25 DIAGNOSIS — N186 End stage renal disease: Secondary | ICD-10-CM | POA: Diagnosis not present

## 2023-05-25 DIAGNOSIS — D631 Anemia in chronic kidney disease: Secondary | ICD-10-CM | POA: Diagnosis not present

## 2023-05-27 DIAGNOSIS — N2581 Secondary hyperparathyroidism of renal origin: Secondary | ICD-10-CM | POA: Diagnosis not present

## 2023-05-27 DIAGNOSIS — Z992 Dependence on renal dialysis: Secondary | ICD-10-CM | POA: Diagnosis not present

## 2023-05-27 DIAGNOSIS — D689 Coagulation defect, unspecified: Secondary | ICD-10-CM | POA: Diagnosis not present

## 2023-05-27 DIAGNOSIS — E1022 Type 1 diabetes mellitus with diabetic chronic kidney disease: Secondary | ICD-10-CM | POA: Diagnosis not present

## 2023-05-27 DIAGNOSIS — N186 End stage renal disease: Secondary | ICD-10-CM | POA: Diagnosis not present

## 2023-05-27 DIAGNOSIS — D631 Anemia in chronic kidney disease: Secondary | ICD-10-CM | POA: Diagnosis not present

## 2023-05-27 DIAGNOSIS — D509 Iron deficiency anemia, unspecified: Secondary | ICD-10-CM | POA: Diagnosis not present

## 2023-05-30 DIAGNOSIS — N186 End stage renal disease: Secondary | ICD-10-CM | POA: Diagnosis not present

## 2023-05-30 DIAGNOSIS — D509 Iron deficiency anemia, unspecified: Secondary | ICD-10-CM | POA: Diagnosis not present

## 2023-05-30 DIAGNOSIS — Z992 Dependence on renal dialysis: Secondary | ICD-10-CM | POA: Diagnosis not present

## 2023-05-30 DIAGNOSIS — D631 Anemia in chronic kidney disease: Secondary | ICD-10-CM | POA: Diagnosis not present

## 2023-05-30 DIAGNOSIS — N2581 Secondary hyperparathyroidism of renal origin: Secondary | ICD-10-CM | POA: Diagnosis not present

## 2023-05-30 DIAGNOSIS — E1022 Type 1 diabetes mellitus with diabetic chronic kidney disease: Secondary | ICD-10-CM | POA: Diagnosis not present

## 2023-05-30 DIAGNOSIS — D689 Coagulation defect, unspecified: Secondary | ICD-10-CM | POA: Diagnosis not present

## 2023-06-01 DIAGNOSIS — N2581 Secondary hyperparathyroidism of renal origin: Secondary | ICD-10-CM | POA: Diagnosis not present

## 2023-06-01 DIAGNOSIS — N186 End stage renal disease: Secondary | ICD-10-CM | POA: Diagnosis not present

## 2023-06-01 DIAGNOSIS — D689 Coagulation defect, unspecified: Secondary | ICD-10-CM | POA: Diagnosis not present

## 2023-06-01 DIAGNOSIS — D631 Anemia in chronic kidney disease: Secondary | ICD-10-CM | POA: Diagnosis not present

## 2023-06-01 DIAGNOSIS — Z992 Dependence on renal dialysis: Secondary | ICD-10-CM | POA: Diagnosis not present

## 2023-06-01 DIAGNOSIS — D509 Iron deficiency anemia, unspecified: Secondary | ICD-10-CM | POA: Diagnosis not present

## 2023-06-01 DIAGNOSIS — E1022 Type 1 diabetes mellitus with diabetic chronic kidney disease: Secondary | ICD-10-CM | POA: Diagnosis not present

## 2023-06-03 DIAGNOSIS — D689 Coagulation defect, unspecified: Secondary | ICD-10-CM | POA: Diagnosis not present

## 2023-06-03 DIAGNOSIS — E1022 Type 1 diabetes mellitus with diabetic chronic kidney disease: Secondary | ICD-10-CM | POA: Diagnosis not present

## 2023-06-03 DIAGNOSIS — D631 Anemia in chronic kidney disease: Secondary | ICD-10-CM | POA: Diagnosis not present

## 2023-06-03 DIAGNOSIS — Z992 Dependence on renal dialysis: Secondary | ICD-10-CM | POA: Diagnosis not present

## 2023-06-03 DIAGNOSIS — N186 End stage renal disease: Secondary | ICD-10-CM | POA: Diagnosis not present

## 2023-06-03 DIAGNOSIS — D509 Iron deficiency anemia, unspecified: Secondary | ICD-10-CM | POA: Diagnosis not present

## 2023-06-03 DIAGNOSIS — N2581 Secondary hyperparathyroidism of renal origin: Secondary | ICD-10-CM | POA: Diagnosis not present

## 2023-06-06 ENCOUNTER — Other Ambulatory Visit (HOSPITAL_COMMUNITY): Payer: Self-pay | Admitting: Internal Medicine

## 2023-06-06 DIAGNOSIS — N186 End stage renal disease: Secondary | ICD-10-CM

## 2023-06-07 ENCOUNTER — Ambulatory Visit (HOSPITAL_COMMUNITY)
Admission: RE | Admit: 2023-06-07 | Discharge: 2023-06-07 | Disposition: A | Discharge status: Home / Self Care | Source: Ambulatory Visit | Attending: Internal Medicine | Admitting: Internal Medicine

## 2023-06-07 ENCOUNTER — Other Ambulatory Visit (HOSPITAL_COMMUNITY): Payer: Self-pay | Admitting: Internal Medicine

## 2023-06-07 DIAGNOSIS — N186 End stage renal disease: Secondary | ICD-10-CM | POA: Diagnosis not present

## 2023-06-07 DIAGNOSIS — T8241XA Breakdown (mechanical) of vascular dialysis catheter, initial encounter: Secondary | ICD-10-CM | POA: Diagnosis not present

## 2023-06-07 DIAGNOSIS — T8249XA Other complication of vascular dialysis catheter, initial encounter: Secondary | ICD-10-CM | POA: Diagnosis not present

## 2023-06-07 HISTORY — PX: IR FLUORO GUIDE CV LINE RIGHT: IMG2283

## 2023-06-07 MED ORDER — CHLORHEXIDINE GLUCONATE 4 % EX SOLN
CUTANEOUS | Status: AC
  Filled 2023-06-07: qty 15

## 2023-06-07 MED ORDER — CEFAZOLIN SODIUM-DEXTROSE 2-4 GM/100ML-% IV SOLN
INTRAVENOUS | Status: AC
  Filled 2023-06-07: qty 100

## 2023-06-07 MED ORDER — CEFAZOLIN SODIUM-DEXTROSE 2-4 GM/100ML-% IV SOLN
2.0000 g | INTRAVENOUS | Status: AC
  Administered 2023-06-07: 2 g via INTRAVENOUS

## 2023-06-07 MED ORDER — LIDOCAINE-EPINEPHRINE 1 %-1:100000 IJ SOLN
INTRAMUSCULAR | Status: AC
  Filled 2023-06-07: qty 1

## 2023-06-07 MED ORDER — HEPARIN SODIUM (PORCINE) 1000 UNIT/ML IJ SOLN
INTRAMUSCULAR | Status: AC
  Filled 2023-06-07: qty 10

## 2023-06-07 NOTE — Procedures (Signed)
 Interventional Radiology Procedure Note  Procedure: Replacement of a right IJ approach tunneled HD catheter.  Tip is positioned at the superior cavoatrial junction and catheter is ready for immediate use.   Complications: None  Recommendations:  - Ok to use - Do not submerge - Routine line care  - DC now  Signed,  Marciano Settles. Mabel Savage, DO

## 2023-06-08 DIAGNOSIS — E1022 Type 1 diabetes mellitus with diabetic chronic kidney disease: Secondary | ICD-10-CM | POA: Diagnosis not present

## 2023-06-08 DIAGNOSIS — D631 Anemia in chronic kidney disease: Secondary | ICD-10-CM | POA: Diagnosis not present

## 2023-06-08 DIAGNOSIS — N2581 Secondary hyperparathyroidism of renal origin: Secondary | ICD-10-CM | POA: Diagnosis not present

## 2023-06-08 DIAGNOSIS — D689 Coagulation defect, unspecified: Secondary | ICD-10-CM | POA: Diagnosis not present

## 2023-06-08 DIAGNOSIS — Z992 Dependence on renal dialysis: Secondary | ICD-10-CM | POA: Diagnosis not present

## 2023-06-08 DIAGNOSIS — D509 Iron deficiency anemia, unspecified: Secondary | ICD-10-CM | POA: Diagnosis not present

## 2023-06-08 DIAGNOSIS — N186 End stage renal disease: Secondary | ICD-10-CM | POA: Diagnosis not present

## 2023-06-10 DIAGNOSIS — Z992 Dependence on renal dialysis: Secondary | ICD-10-CM | POA: Diagnosis not present

## 2023-06-10 DIAGNOSIS — D689 Coagulation defect, unspecified: Secondary | ICD-10-CM | POA: Diagnosis not present

## 2023-06-10 DIAGNOSIS — N186 End stage renal disease: Secondary | ICD-10-CM | POA: Diagnosis not present

## 2023-06-10 DIAGNOSIS — D509 Iron deficiency anemia, unspecified: Secondary | ICD-10-CM | POA: Diagnosis not present

## 2023-06-10 DIAGNOSIS — N2581 Secondary hyperparathyroidism of renal origin: Secondary | ICD-10-CM | POA: Diagnosis not present

## 2023-06-10 DIAGNOSIS — D631 Anemia in chronic kidney disease: Secondary | ICD-10-CM | POA: Diagnosis not present

## 2023-06-10 DIAGNOSIS — E1022 Type 1 diabetes mellitus with diabetic chronic kidney disease: Secondary | ICD-10-CM | POA: Diagnosis not present

## 2023-06-13 DIAGNOSIS — N2581 Secondary hyperparathyroidism of renal origin: Secondary | ICD-10-CM | POA: Diagnosis not present

## 2023-06-13 DIAGNOSIS — D631 Anemia in chronic kidney disease: Secondary | ICD-10-CM | POA: Diagnosis not present

## 2023-06-13 DIAGNOSIS — E1022 Type 1 diabetes mellitus with diabetic chronic kidney disease: Secondary | ICD-10-CM | POA: Diagnosis not present

## 2023-06-13 DIAGNOSIS — D509 Iron deficiency anemia, unspecified: Secondary | ICD-10-CM | POA: Diagnosis not present

## 2023-06-13 DIAGNOSIS — Z992 Dependence on renal dialysis: Secondary | ICD-10-CM | POA: Diagnosis not present

## 2023-06-13 DIAGNOSIS — D689 Coagulation defect, unspecified: Secondary | ICD-10-CM | POA: Diagnosis not present

## 2023-06-13 DIAGNOSIS — N186 End stage renal disease: Secondary | ICD-10-CM | POA: Diagnosis not present

## 2023-06-15 DIAGNOSIS — Z992 Dependence on renal dialysis: Secondary | ICD-10-CM | POA: Diagnosis not present

## 2023-06-15 DIAGNOSIS — N2581 Secondary hyperparathyroidism of renal origin: Secondary | ICD-10-CM | POA: Diagnosis not present

## 2023-06-15 DIAGNOSIS — E1022 Type 1 diabetes mellitus with diabetic chronic kidney disease: Secondary | ICD-10-CM | POA: Diagnosis not present

## 2023-06-15 DIAGNOSIS — D631 Anemia in chronic kidney disease: Secondary | ICD-10-CM | POA: Diagnosis not present

## 2023-06-15 DIAGNOSIS — D689 Coagulation defect, unspecified: Secondary | ICD-10-CM | POA: Diagnosis not present

## 2023-06-15 DIAGNOSIS — N186 End stage renal disease: Secondary | ICD-10-CM | POA: Diagnosis not present

## 2023-06-15 DIAGNOSIS — D509 Iron deficiency anemia, unspecified: Secondary | ICD-10-CM | POA: Diagnosis not present

## 2023-06-20 DIAGNOSIS — E1022 Type 1 diabetes mellitus with diabetic chronic kidney disease: Secondary | ICD-10-CM | POA: Diagnosis not present

## 2023-06-20 DIAGNOSIS — N2581 Secondary hyperparathyroidism of renal origin: Secondary | ICD-10-CM | POA: Diagnosis not present

## 2023-06-20 DIAGNOSIS — Z992 Dependence on renal dialysis: Secondary | ICD-10-CM | POA: Diagnosis not present

## 2023-06-20 DIAGNOSIS — D689 Coagulation defect, unspecified: Secondary | ICD-10-CM | POA: Diagnosis not present

## 2023-06-20 DIAGNOSIS — D631 Anemia in chronic kidney disease: Secondary | ICD-10-CM | POA: Diagnosis not present

## 2023-06-20 DIAGNOSIS — N186 End stage renal disease: Secondary | ICD-10-CM | POA: Diagnosis not present

## 2023-06-20 DIAGNOSIS — D509 Iron deficiency anemia, unspecified: Secondary | ICD-10-CM | POA: Diagnosis not present

## 2023-06-22 DIAGNOSIS — N2581 Secondary hyperparathyroidism of renal origin: Secondary | ICD-10-CM | POA: Diagnosis not present

## 2023-06-22 DIAGNOSIS — N186 End stage renal disease: Secondary | ICD-10-CM | POA: Diagnosis not present

## 2023-06-22 DIAGNOSIS — D631 Anemia in chronic kidney disease: Secondary | ICD-10-CM | POA: Diagnosis not present

## 2023-06-22 DIAGNOSIS — D689 Coagulation defect, unspecified: Secondary | ICD-10-CM | POA: Diagnosis not present

## 2023-06-22 DIAGNOSIS — D509 Iron deficiency anemia, unspecified: Secondary | ICD-10-CM | POA: Diagnosis not present

## 2023-06-22 DIAGNOSIS — Z992 Dependence on renal dialysis: Secondary | ICD-10-CM | POA: Diagnosis not present

## 2023-06-22 DIAGNOSIS — E1022 Type 1 diabetes mellitus with diabetic chronic kidney disease: Secondary | ICD-10-CM | POA: Diagnosis not present

## 2023-06-24 DIAGNOSIS — N186 End stage renal disease: Secondary | ICD-10-CM | POA: Diagnosis not present

## 2023-06-24 DIAGNOSIS — E1022 Type 1 diabetes mellitus with diabetic chronic kidney disease: Secondary | ICD-10-CM | POA: Diagnosis not present

## 2023-06-24 DIAGNOSIS — D631 Anemia in chronic kidney disease: Secondary | ICD-10-CM | POA: Diagnosis not present

## 2023-06-24 DIAGNOSIS — N2581 Secondary hyperparathyroidism of renal origin: Secondary | ICD-10-CM | POA: Diagnosis not present

## 2023-06-24 DIAGNOSIS — D509 Iron deficiency anemia, unspecified: Secondary | ICD-10-CM | POA: Diagnosis not present

## 2023-06-24 DIAGNOSIS — D689 Coagulation defect, unspecified: Secondary | ICD-10-CM | POA: Diagnosis not present

## 2023-06-24 DIAGNOSIS — Z992 Dependence on renal dialysis: Secondary | ICD-10-CM | POA: Diagnosis not present

## 2023-06-27 DIAGNOSIS — N2581 Secondary hyperparathyroidism of renal origin: Secondary | ICD-10-CM | POA: Diagnosis not present

## 2023-06-27 DIAGNOSIS — E1022 Type 1 diabetes mellitus with diabetic chronic kidney disease: Secondary | ICD-10-CM | POA: Diagnosis not present

## 2023-06-27 DIAGNOSIS — D631 Anemia in chronic kidney disease: Secondary | ICD-10-CM | POA: Diagnosis not present

## 2023-06-27 DIAGNOSIS — D509 Iron deficiency anemia, unspecified: Secondary | ICD-10-CM | POA: Diagnosis not present

## 2023-06-27 DIAGNOSIS — N186 End stage renal disease: Secondary | ICD-10-CM | POA: Diagnosis not present

## 2023-06-27 DIAGNOSIS — D689 Coagulation defect, unspecified: Secondary | ICD-10-CM | POA: Diagnosis not present

## 2023-06-27 DIAGNOSIS — Z992 Dependence on renal dialysis: Secondary | ICD-10-CM | POA: Diagnosis not present

## 2023-06-29 DIAGNOSIS — Z992 Dependence on renal dialysis: Secondary | ICD-10-CM | POA: Diagnosis not present

## 2023-06-29 DIAGNOSIS — D689 Coagulation defect, unspecified: Secondary | ICD-10-CM | POA: Diagnosis not present

## 2023-06-29 DIAGNOSIS — N2581 Secondary hyperparathyroidism of renal origin: Secondary | ICD-10-CM | POA: Diagnosis not present

## 2023-06-29 DIAGNOSIS — D509 Iron deficiency anemia, unspecified: Secondary | ICD-10-CM | POA: Diagnosis not present

## 2023-06-29 DIAGNOSIS — D631 Anemia in chronic kidney disease: Secondary | ICD-10-CM | POA: Diagnosis not present

## 2023-06-29 DIAGNOSIS — E1022 Type 1 diabetes mellitus with diabetic chronic kidney disease: Secondary | ICD-10-CM | POA: Diagnosis not present

## 2023-06-29 DIAGNOSIS — N186 End stage renal disease: Secondary | ICD-10-CM | POA: Diagnosis not present

## 2023-07-01 DIAGNOSIS — E1022 Type 1 diabetes mellitus with diabetic chronic kidney disease: Secondary | ICD-10-CM | POA: Diagnosis not present

## 2023-07-01 DIAGNOSIS — D689 Coagulation defect, unspecified: Secondary | ICD-10-CM | POA: Diagnosis not present

## 2023-07-01 DIAGNOSIS — D631 Anemia in chronic kidney disease: Secondary | ICD-10-CM | POA: Diagnosis not present

## 2023-07-01 DIAGNOSIS — N2581 Secondary hyperparathyroidism of renal origin: Secondary | ICD-10-CM | POA: Diagnosis not present

## 2023-07-01 DIAGNOSIS — D509 Iron deficiency anemia, unspecified: Secondary | ICD-10-CM | POA: Diagnosis not present

## 2023-07-01 DIAGNOSIS — N186 End stage renal disease: Secondary | ICD-10-CM | POA: Diagnosis not present

## 2023-07-01 DIAGNOSIS — Z992 Dependence on renal dialysis: Secondary | ICD-10-CM | POA: Diagnosis not present

## 2023-07-04 DIAGNOSIS — D509 Iron deficiency anemia, unspecified: Secondary | ICD-10-CM | POA: Diagnosis not present

## 2023-07-04 DIAGNOSIS — D631 Anemia in chronic kidney disease: Secondary | ICD-10-CM | POA: Diagnosis not present

## 2023-07-04 DIAGNOSIS — Z992 Dependence on renal dialysis: Secondary | ICD-10-CM | POA: Diagnosis not present

## 2023-07-04 DIAGNOSIS — E1022 Type 1 diabetes mellitus with diabetic chronic kidney disease: Secondary | ICD-10-CM | POA: Diagnosis not present

## 2023-07-04 DIAGNOSIS — N186 End stage renal disease: Secondary | ICD-10-CM | POA: Diagnosis not present

## 2023-07-04 DIAGNOSIS — D689 Coagulation defect, unspecified: Secondary | ICD-10-CM | POA: Diagnosis not present

## 2023-07-04 DIAGNOSIS — N2581 Secondary hyperparathyroidism of renal origin: Secondary | ICD-10-CM | POA: Diagnosis not present

## 2023-07-06 DIAGNOSIS — D689 Coagulation defect, unspecified: Secondary | ICD-10-CM | POA: Diagnosis not present

## 2023-07-06 DIAGNOSIS — D631 Anemia in chronic kidney disease: Secondary | ICD-10-CM | POA: Diagnosis not present

## 2023-07-06 DIAGNOSIS — N186 End stage renal disease: Secondary | ICD-10-CM | POA: Diagnosis not present

## 2023-07-06 DIAGNOSIS — E1022 Type 1 diabetes mellitus with diabetic chronic kidney disease: Secondary | ICD-10-CM | POA: Diagnosis not present

## 2023-07-06 DIAGNOSIS — N2581 Secondary hyperparathyroidism of renal origin: Secondary | ICD-10-CM | POA: Diagnosis not present

## 2023-07-06 DIAGNOSIS — Z992 Dependence on renal dialysis: Secondary | ICD-10-CM | POA: Diagnosis not present

## 2023-07-06 DIAGNOSIS — D509 Iron deficiency anemia, unspecified: Secondary | ICD-10-CM | POA: Diagnosis not present

## 2023-07-08 DIAGNOSIS — N186 End stage renal disease: Secondary | ICD-10-CM | POA: Diagnosis not present

## 2023-07-08 DIAGNOSIS — E1022 Type 1 diabetes mellitus with diabetic chronic kidney disease: Secondary | ICD-10-CM | POA: Diagnosis not present

## 2023-07-08 DIAGNOSIS — D509 Iron deficiency anemia, unspecified: Secondary | ICD-10-CM | POA: Diagnosis not present

## 2023-07-08 DIAGNOSIS — D631 Anemia in chronic kidney disease: Secondary | ICD-10-CM | POA: Diagnosis not present

## 2023-07-08 DIAGNOSIS — N2581 Secondary hyperparathyroidism of renal origin: Secondary | ICD-10-CM | POA: Diagnosis not present

## 2023-07-08 DIAGNOSIS — D689 Coagulation defect, unspecified: Secondary | ICD-10-CM | POA: Diagnosis not present

## 2023-07-08 DIAGNOSIS — Z992 Dependence on renal dialysis: Secondary | ICD-10-CM | POA: Diagnosis not present

## 2023-07-11 DIAGNOSIS — D689 Coagulation defect, unspecified: Secondary | ICD-10-CM | POA: Diagnosis not present

## 2023-07-11 DIAGNOSIS — N2581 Secondary hyperparathyroidism of renal origin: Secondary | ICD-10-CM | POA: Diagnosis not present

## 2023-07-11 DIAGNOSIS — E1022 Type 1 diabetes mellitus with diabetic chronic kidney disease: Secondary | ICD-10-CM | POA: Diagnosis not present

## 2023-07-11 DIAGNOSIS — D509 Iron deficiency anemia, unspecified: Secondary | ICD-10-CM | POA: Diagnosis not present

## 2023-07-11 DIAGNOSIS — N186 End stage renal disease: Secondary | ICD-10-CM | POA: Diagnosis not present

## 2023-07-11 DIAGNOSIS — Z992 Dependence on renal dialysis: Secondary | ICD-10-CM | POA: Diagnosis not present

## 2023-07-11 DIAGNOSIS — D631 Anemia in chronic kidney disease: Secondary | ICD-10-CM | POA: Diagnosis not present

## 2023-07-12 NOTE — Addendum Note (Signed)
 Encounter addended by: Karolynn Pack on: 07/12/2023 3:35 PM  Actions taken: Imaging Exam ended

## 2023-07-13 DIAGNOSIS — D509 Iron deficiency anemia, unspecified: Secondary | ICD-10-CM | POA: Diagnosis not present

## 2023-07-13 DIAGNOSIS — N2581 Secondary hyperparathyroidism of renal origin: Secondary | ICD-10-CM | POA: Diagnosis not present

## 2023-07-13 DIAGNOSIS — D689 Coagulation defect, unspecified: Secondary | ICD-10-CM | POA: Diagnosis not present

## 2023-07-13 DIAGNOSIS — D631 Anemia in chronic kidney disease: Secondary | ICD-10-CM | POA: Diagnosis not present

## 2023-07-13 DIAGNOSIS — E1022 Type 1 diabetes mellitus with diabetic chronic kidney disease: Secondary | ICD-10-CM | POA: Diagnosis not present

## 2023-07-13 DIAGNOSIS — N186 End stage renal disease: Secondary | ICD-10-CM | POA: Diagnosis not present

## 2023-07-13 DIAGNOSIS — Z992 Dependence on renal dialysis: Secondary | ICD-10-CM | POA: Diagnosis not present

## 2023-07-18 DIAGNOSIS — E1022 Type 1 diabetes mellitus with diabetic chronic kidney disease: Secondary | ICD-10-CM | POA: Diagnosis not present

## 2023-07-18 DIAGNOSIS — N186 End stage renal disease: Secondary | ICD-10-CM | POA: Diagnosis not present

## 2023-07-18 DIAGNOSIS — N2581 Secondary hyperparathyroidism of renal origin: Secondary | ICD-10-CM | POA: Diagnosis not present

## 2023-07-18 DIAGNOSIS — Z992 Dependence on renal dialysis: Secondary | ICD-10-CM | POA: Diagnosis not present

## 2023-07-18 DIAGNOSIS — D509 Iron deficiency anemia, unspecified: Secondary | ICD-10-CM | POA: Diagnosis not present

## 2023-07-18 DIAGNOSIS — D631 Anemia in chronic kidney disease: Secondary | ICD-10-CM | POA: Diagnosis not present

## 2023-07-18 DIAGNOSIS — D689 Coagulation defect, unspecified: Secondary | ICD-10-CM | POA: Diagnosis not present

## 2023-07-20 DIAGNOSIS — E1022 Type 1 diabetes mellitus with diabetic chronic kidney disease: Secondary | ICD-10-CM | POA: Diagnosis not present

## 2023-07-20 DIAGNOSIS — D509 Iron deficiency anemia, unspecified: Secondary | ICD-10-CM | POA: Diagnosis not present

## 2023-07-20 DIAGNOSIS — Z992 Dependence on renal dialysis: Secondary | ICD-10-CM | POA: Diagnosis not present

## 2023-07-20 DIAGNOSIS — D631 Anemia in chronic kidney disease: Secondary | ICD-10-CM | POA: Diagnosis not present

## 2023-07-20 DIAGNOSIS — D689 Coagulation defect, unspecified: Secondary | ICD-10-CM | POA: Diagnosis not present

## 2023-07-20 DIAGNOSIS — N186 End stage renal disease: Secondary | ICD-10-CM | POA: Diagnosis not present

## 2023-07-20 DIAGNOSIS — N2581 Secondary hyperparathyroidism of renal origin: Secondary | ICD-10-CM | POA: Diagnosis not present

## 2023-07-22 DIAGNOSIS — D509 Iron deficiency anemia, unspecified: Secondary | ICD-10-CM | POA: Diagnosis not present

## 2023-07-22 DIAGNOSIS — D631 Anemia in chronic kidney disease: Secondary | ICD-10-CM | POA: Diagnosis not present

## 2023-07-22 DIAGNOSIS — Z992 Dependence on renal dialysis: Secondary | ICD-10-CM | POA: Diagnosis not present

## 2023-07-22 DIAGNOSIS — N186 End stage renal disease: Secondary | ICD-10-CM | POA: Diagnosis not present

## 2023-07-22 DIAGNOSIS — E1022 Type 1 diabetes mellitus with diabetic chronic kidney disease: Secondary | ICD-10-CM | POA: Diagnosis not present

## 2023-07-22 DIAGNOSIS — N2581 Secondary hyperparathyroidism of renal origin: Secondary | ICD-10-CM | POA: Diagnosis not present

## 2023-07-22 DIAGNOSIS — D689 Coagulation defect, unspecified: Secondary | ICD-10-CM | POA: Diagnosis not present

## 2023-07-24 DIAGNOSIS — E1022 Type 1 diabetes mellitus with diabetic chronic kidney disease: Secondary | ICD-10-CM | POA: Diagnosis not present

## 2023-07-24 DIAGNOSIS — N186 End stage renal disease: Secondary | ICD-10-CM | POA: Diagnosis not present

## 2023-07-24 DIAGNOSIS — Z992 Dependence on renal dialysis: Secondary | ICD-10-CM | POA: Diagnosis not present

## 2023-07-25 DIAGNOSIS — Z992 Dependence on renal dialysis: Secondary | ICD-10-CM | POA: Diagnosis not present

## 2023-07-25 DIAGNOSIS — N2581 Secondary hyperparathyroidism of renal origin: Secondary | ICD-10-CM | POA: Diagnosis not present

## 2023-07-25 DIAGNOSIS — D689 Coagulation defect, unspecified: Secondary | ICD-10-CM | POA: Diagnosis not present

## 2023-07-25 DIAGNOSIS — T8249XA Other complication of vascular dialysis catheter, initial encounter: Secondary | ICD-10-CM | POA: Diagnosis not present

## 2023-07-25 DIAGNOSIS — R519 Headache, unspecified: Secondary | ICD-10-CM | POA: Diagnosis not present

## 2023-07-25 DIAGNOSIS — N186 End stage renal disease: Secondary | ICD-10-CM | POA: Diagnosis not present

## 2023-07-25 DIAGNOSIS — E1022 Type 1 diabetes mellitus with diabetic chronic kidney disease: Secondary | ICD-10-CM | POA: Diagnosis not present

## 2023-07-25 DIAGNOSIS — D631 Anemia in chronic kidney disease: Secondary | ICD-10-CM | POA: Diagnosis not present

## 2023-07-25 DIAGNOSIS — D509 Iron deficiency anemia, unspecified: Secondary | ICD-10-CM | POA: Diagnosis not present

## 2023-07-27 DIAGNOSIS — T8249XA Other complication of vascular dialysis catheter, initial encounter: Secondary | ICD-10-CM | POA: Diagnosis not present

## 2023-07-27 DIAGNOSIS — D631 Anemia in chronic kidney disease: Secondary | ICD-10-CM | POA: Diagnosis not present

## 2023-07-27 DIAGNOSIS — Z992 Dependence on renal dialysis: Secondary | ICD-10-CM | POA: Diagnosis not present

## 2023-07-27 DIAGNOSIS — R519 Headache, unspecified: Secondary | ICD-10-CM | POA: Diagnosis not present

## 2023-07-27 DIAGNOSIS — D509 Iron deficiency anemia, unspecified: Secondary | ICD-10-CM | POA: Diagnosis not present

## 2023-07-27 DIAGNOSIS — E1022 Type 1 diabetes mellitus with diabetic chronic kidney disease: Secondary | ICD-10-CM | POA: Diagnosis not present

## 2023-07-27 DIAGNOSIS — D689 Coagulation defect, unspecified: Secondary | ICD-10-CM | POA: Diagnosis not present

## 2023-07-27 DIAGNOSIS — N186 End stage renal disease: Secondary | ICD-10-CM | POA: Diagnosis not present

## 2023-07-27 DIAGNOSIS — N2581 Secondary hyperparathyroidism of renal origin: Secondary | ICD-10-CM | POA: Diagnosis not present

## 2023-07-29 DIAGNOSIS — Z992 Dependence on renal dialysis: Secondary | ICD-10-CM | POA: Diagnosis not present

## 2023-07-29 DIAGNOSIS — D509 Iron deficiency anemia, unspecified: Secondary | ICD-10-CM | POA: Diagnosis not present

## 2023-07-29 DIAGNOSIS — N2581 Secondary hyperparathyroidism of renal origin: Secondary | ICD-10-CM | POA: Diagnosis not present

## 2023-07-29 DIAGNOSIS — D689 Coagulation defect, unspecified: Secondary | ICD-10-CM | POA: Diagnosis not present

## 2023-07-29 DIAGNOSIS — N186 End stage renal disease: Secondary | ICD-10-CM | POA: Diagnosis not present

## 2023-07-29 DIAGNOSIS — E1022 Type 1 diabetes mellitus with diabetic chronic kidney disease: Secondary | ICD-10-CM | POA: Diagnosis not present

## 2023-07-29 DIAGNOSIS — R519 Headache, unspecified: Secondary | ICD-10-CM | POA: Diagnosis not present

## 2023-07-29 DIAGNOSIS — T8249XA Other complication of vascular dialysis catheter, initial encounter: Secondary | ICD-10-CM | POA: Diagnosis not present

## 2023-07-29 DIAGNOSIS — D631 Anemia in chronic kidney disease: Secondary | ICD-10-CM | POA: Diagnosis not present

## 2023-08-01 DIAGNOSIS — N2581 Secondary hyperparathyroidism of renal origin: Secondary | ICD-10-CM | POA: Diagnosis not present

## 2023-08-01 DIAGNOSIS — D631 Anemia in chronic kidney disease: Secondary | ICD-10-CM | POA: Diagnosis not present

## 2023-08-01 DIAGNOSIS — T8249XA Other complication of vascular dialysis catheter, initial encounter: Secondary | ICD-10-CM | POA: Diagnosis not present

## 2023-08-01 DIAGNOSIS — E1022 Type 1 diabetes mellitus with diabetic chronic kidney disease: Secondary | ICD-10-CM | POA: Diagnosis not present

## 2023-08-01 DIAGNOSIS — Z992 Dependence on renal dialysis: Secondary | ICD-10-CM | POA: Diagnosis not present

## 2023-08-01 DIAGNOSIS — N186 End stage renal disease: Secondary | ICD-10-CM | POA: Diagnosis not present

## 2023-08-01 DIAGNOSIS — D689 Coagulation defect, unspecified: Secondary | ICD-10-CM | POA: Diagnosis not present

## 2023-08-01 DIAGNOSIS — R519 Headache, unspecified: Secondary | ICD-10-CM | POA: Diagnosis not present

## 2023-08-01 DIAGNOSIS — D509 Iron deficiency anemia, unspecified: Secondary | ICD-10-CM | POA: Diagnosis not present

## 2023-08-04 DIAGNOSIS — E041 Nontoxic single thyroid nodule: Secondary | ICD-10-CM | POA: Diagnosis not present

## 2023-08-04 DIAGNOSIS — I120 Hypertensive chronic kidney disease with stage 5 chronic kidney disease or end stage renal disease: Secondary | ICD-10-CM | POA: Diagnosis not present

## 2023-08-04 DIAGNOSIS — T148XXA Other injury of unspecified body region, initial encounter: Secondary | ICD-10-CM | POA: Diagnosis not present

## 2023-08-04 DIAGNOSIS — G894 Chronic pain syndrome: Secondary | ICD-10-CM | POA: Diagnosis not present

## 2023-08-04 DIAGNOSIS — Z0000 Encounter for general adult medical examination without abnormal findings: Secondary | ICD-10-CM | POA: Diagnosis not present

## 2023-08-04 DIAGNOSIS — I10 Essential (primary) hypertension: Secondary | ICD-10-CM | POA: Diagnosis not present

## 2023-08-04 DIAGNOSIS — N2581 Secondary hyperparathyroidism of renal origin: Secondary | ICD-10-CM | POA: Diagnosis not present

## 2023-08-04 DIAGNOSIS — G4700 Insomnia, unspecified: Secondary | ICD-10-CM | POA: Diagnosis not present

## 2023-08-04 DIAGNOSIS — E7800 Pure hypercholesterolemia, unspecified: Secondary | ICD-10-CM | POA: Diagnosis not present

## 2023-08-04 DIAGNOSIS — E440 Moderate protein-calorie malnutrition: Secondary | ICD-10-CM | POA: Diagnosis not present

## 2023-08-04 DIAGNOSIS — J9611 Chronic respiratory failure with hypoxia: Secondary | ICD-10-CM | POA: Diagnosis not present

## 2023-08-05 DIAGNOSIS — Z992 Dependence on renal dialysis: Secondary | ICD-10-CM | POA: Diagnosis not present

## 2023-08-05 DIAGNOSIS — D631 Anemia in chronic kidney disease: Secondary | ICD-10-CM | POA: Diagnosis not present

## 2023-08-05 DIAGNOSIS — R519 Headache, unspecified: Secondary | ICD-10-CM | POA: Diagnosis not present

## 2023-08-05 DIAGNOSIS — T8249XA Other complication of vascular dialysis catheter, initial encounter: Secondary | ICD-10-CM | POA: Diagnosis not present

## 2023-08-05 DIAGNOSIS — D689 Coagulation defect, unspecified: Secondary | ICD-10-CM | POA: Diagnosis not present

## 2023-08-05 DIAGNOSIS — N186 End stage renal disease: Secondary | ICD-10-CM | POA: Diagnosis not present

## 2023-08-05 DIAGNOSIS — D509 Iron deficiency anemia, unspecified: Secondary | ICD-10-CM | POA: Diagnosis not present

## 2023-08-05 DIAGNOSIS — N2581 Secondary hyperparathyroidism of renal origin: Secondary | ICD-10-CM | POA: Diagnosis not present

## 2023-08-05 DIAGNOSIS — E1022 Type 1 diabetes mellitus with diabetic chronic kidney disease: Secondary | ICD-10-CM | POA: Diagnosis not present

## 2023-08-08 DIAGNOSIS — D631 Anemia in chronic kidney disease: Secondary | ICD-10-CM | POA: Diagnosis not present

## 2023-08-08 DIAGNOSIS — D689 Coagulation defect, unspecified: Secondary | ICD-10-CM | POA: Diagnosis not present

## 2023-08-08 DIAGNOSIS — T8249XA Other complication of vascular dialysis catheter, initial encounter: Secondary | ICD-10-CM | POA: Diagnosis not present

## 2023-08-08 DIAGNOSIS — Z992 Dependence on renal dialysis: Secondary | ICD-10-CM | POA: Diagnosis not present

## 2023-08-08 DIAGNOSIS — E1022 Type 1 diabetes mellitus with diabetic chronic kidney disease: Secondary | ICD-10-CM | POA: Diagnosis not present

## 2023-08-08 DIAGNOSIS — N2581 Secondary hyperparathyroidism of renal origin: Secondary | ICD-10-CM | POA: Diagnosis not present

## 2023-08-08 DIAGNOSIS — N186 End stage renal disease: Secondary | ICD-10-CM | POA: Diagnosis not present

## 2023-08-08 DIAGNOSIS — D509 Iron deficiency anemia, unspecified: Secondary | ICD-10-CM | POA: Diagnosis not present

## 2023-08-08 DIAGNOSIS — R519 Headache, unspecified: Secondary | ICD-10-CM | POA: Diagnosis not present

## 2023-08-10 DIAGNOSIS — R519 Headache, unspecified: Secondary | ICD-10-CM | POA: Diagnosis not present

## 2023-08-10 DIAGNOSIS — N186 End stage renal disease: Secondary | ICD-10-CM | POA: Diagnosis not present

## 2023-08-10 DIAGNOSIS — Z992 Dependence on renal dialysis: Secondary | ICD-10-CM | POA: Diagnosis not present

## 2023-08-10 DIAGNOSIS — E1022 Type 1 diabetes mellitus with diabetic chronic kidney disease: Secondary | ICD-10-CM | POA: Diagnosis not present

## 2023-08-10 DIAGNOSIS — N2581 Secondary hyperparathyroidism of renal origin: Secondary | ICD-10-CM | POA: Diagnosis not present

## 2023-08-10 DIAGNOSIS — D509 Iron deficiency anemia, unspecified: Secondary | ICD-10-CM | POA: Diagnosis not present

## 2023-08-10 DIAGNOSIS — D631 Anemia in chronic kidney disease: Secondary | ICD-10-CM | POA: Diagnosis not present

## 2023-08-10 DIAGNOSIS — D689 Coagulation defect, unspecified: Secondary | ICD-10-CM | POA: Diagnosis not present

## 2023-08-10 DIAGNOSIS — T8249XA Other complication of vascular dialysis catheter, initial encounter: Secondary | ICD-10-CM | POA: Diagnosis not present

## 2023-08-12 DIAGNOSIS — T8249XA Other complication of vascular dialysis catheter, initial encounter: Secondary | ICD-10-CM | POA: Diagnosis not present

## 2023-08-12 DIAGNOSIS — R519 Headache, unspecified: Secondary | ICD-10-CM | POA: Diagnosis not present

## 2023-08-12 DIAGNOSIS — E1022 Type 1 diabetes mellitus with diabetic chronic kidney disease: Secondary | ICD-10-CM | POA: Diagnosis not present

## 2023-08-12 DIAGNOSIS — D631 Anemia in chronic kidney disease: Secondary | ICD-10-CM | POA: Diagnosis not present

## 2023-08-12 DIAGNOSIS — Z992 Dependence on renal dialysis: Secondary | ICD-10-CM | POA: Diagnosis not present

## 2023-08-12 DIAGNOSIS — N2581 Secondary hyperparathyroidism of renal origin: Secondary | ICD-10-CM | POA: Diagnosis not present

## 2023-08-12 DIAGNOSIS — D509 Iron deficiency anemia, unspecified: Secondary | ICD-10-CM | POA: Diagnosis not present

## 2023-08-12 DIAGNOSIS — N186 End stage renal disease: Secondary | ICD-10-CM | POA: Diagnosis not present

## 2023-08-12 DIAGNOSIS — D689 Coagulation defect, unspecified: Secondary | ICD-10-CM | POA: Diagnosis not present

## 2023-08-15 DIAGNOSIS — D631 Anemia in chronic kidney disease: Secondary | ICD-10-CM | POA: Diagnosis not present

## 2023-08-15 DIAGNOSIS — E1022 Type 1 diabetes mellitus with diabetic chronic kidney disease: Secondary | ICD-10-CM | POA: Diagnosis not present

## 2023-08-15 DIAGNOSIS — R519 Headache, unspecified: Secondary | ICD-10-CM | POA: Diagnosis not present

## 2023-08-15 DIAGNOSIS — N186 End stage renal disease: Secondary | ICD-10-CM | POA: Diagnosis not present

## 2023-08-15 DIAGNOSIS — Z992 Dependence on renal dialysis: Secondary | ICD-10-CM | POA: Diagnosis not present

## 2023-08-15 DIAGNOSIS — T8249XA Other complication of vascular dialysis catheter, initial encounter: Secondary | ICD-10-CM | POA: Diagnosis not present

## 2023-08-15 DIAGNOSIS — N2581 Secondary hyperparathyroidism of renal origin: Secondary | ICD-10-CM | POA: Diagnosis not present

## 2023-08-15 DIAGNOSIS — D689 Coagulation defect, unspecified: Secondary | ICD-10-CM | POA: Diagnosis not present

## 2023-08-15 DIAGNOSIS — D509 Iron deficiency anemia, unspecified: Secondary | ICD-10-CM | POA: Diagnosis not present

## 2023-08-17 DIAGNOSIS — N2581 Secondary hyperparathyroidism of renal origin: Secondary | ICD-10-CM | POA: Diagnosis not present

## 2023-08-17 DIAGNOSIS — T8249XA Other complication of vascular dialysis catheter, initial encounter: Secondary | ICD-10-CM | POA: Diagnosis not present

## 2023-08-17 DIAGNOSIS — N186 End stage renal disease: Secondary | ICD-10-CM | POA: Diagnosis not present

## 2023-08-17 DIAGNOSIS — D509 Iron deficiency anemia, unspecified: Secondary | ICD-10-CM | POA: Diagnosis not present

## 2023-08-17 DIAGNOSIS — D689 Coagulation defect, unspecified: Secondary | ICD-10-CM | POA: Diagnosis not present

## 2023-08-17 DIAGNOSIS — R519 Headache, unspecified: Secondary | ICD-10-CM | POA: Diagnosis not present

## 2023-08-17 DIAGNOSIS — Z992 Dependence on renal dialysis: Secondary | ICD-10-CM | POA: Diagnosis not present

## 2023-08-17 DIAGNOSIS — D631 Anemia in chronic kidney disease: Secondary | ICD-10-CM | POA: Diagnosis not present

## 2023-08-17 DIAGNOSIS — E1022 Type 1 diabetes mellitus with diabetic chronic kidney disease: Secondary | ICD-10-CM | POA: Diagnosis not present

## 2023-08-18 ENCOUNTER — Telehealth: Payer: Self-pay | Admitting: Vascular Surgery

## 2023-08-18 ENCOUNTER — Other Ambulatory Visit: Payer: Self-pay

## 2023-08-18 ENCOUNTER — Ambulatory Visit (HOSPITAL_COMMUNITY)
Admission: RE | Admit: 2023-08-18 | Discharge: 2023-08-18 | Disposition: A | Discharge status: Home / Self Care | Attending: Vascular Surgery | Admitting: Vascular Surgery

## 2023-08-18 ENCOUNTER — Encounter (HOSPITAL_COMMUNITY)
Admission: RE | Disposition: A | Discharge status: Home / Self Care | Payer: Self-pay | Source: Home / Self Care | Attending: Vascular Surgery

## 2023-08-18 DIAGNOSIS — T8249XA Other complication of vascular dialysis catheter, initial encounter: Secondary | ICD-10-CM | POA: Diagnosis not present

## 2023-08-18 DIAGNOSIS — N186 End stage renal disease: Secondary | ICD-10-CM | POA: Diagnosis not present

## 2023-08-18 DIAGNOSIS — Z4901 Encounter for fitting and adjustment of extracorporeal dialysis catheter: Secondary | ICD-10-CM | POA: Diagnosis not present

## 2023-08-18 DIAGNOSIS — I120 Hypertensive chronic kidney disease with stage 5 chronic kidney disease or end stage renal disease: Secondary | ICD-10-CM | POA: Insufficient documentation

## 2023-08-18 DIAGNOSIS — Z87891 Personal history of nicotine dependence: Secondary | ICD-10-CM | POA: Insufficient documentation

## 2023-08-18 HISTORY — PX: TUNNELLED CATHETER EXCHANGE: CATH118373

## 2023-08-18 SURGERY — TUNNELLED CATHETER EXCHANGE
Anesthesia: LOCAL

## 2023-08-18 MED ORDER — LIDOCAINE HCL (PF) 1 % IJ SOLN
INTRAMUSCULAR | Status: DC | PRN
  Administered 2023-08-18 (×2): 5 mL
  Administered 2023-08-18: 15 mL

## 2023-08-18 MED ORDER — ACETAMINOPHEN 325 MG PO TABS
ORAL_TABLET | ORAL | Status: AC
  Filled 2023-08-18: qty 2

## 2023-08-18 MED ORDER — ACETAMINOPHEN 325 MG PO TABS
650.0000 mg | ORAL_TABLET | Freq: Four times a day (QID) | ORAL | Status: AC | PRN
  Administered 2023-08-18: 650 mg via ORAL

## 2023-08-18 MED ORDER — MIDAZOLAM HCL 2 MG/2ML IJ SOLN
INTRAMUSCULAR | Status: AC
  Filled 2023-08-18: qty 2

## 2023-08-18 MED ORDER — LIDOCAINE HCL (PF) 1 % IJ SOLN
INTRAMUSCULAR | Status: AC
Start: 2023-08-18 — End: 2023-08-18
  Filled 2023-08-18: qty 30

## 2023-08-18 MED ORDER — HEPARIN (PORCINE) IN NACL 1000-0.9 UT/500ML-% IV SOLN
INTRAVENOUS | Status: DC | PRN
  Administered 2023-08-18: 500 mL

## 2023-08-18 MED ORDER — FENTANYL CITRATE (PF) 100 MCG/2ML IJ SOLN
INTRAMUSCULAR | Status: DC | PRN
  Administered 2023-08-18: 25 ug via INTRAVENOUS

## 2023-08-18 MED ORDER — HEPARIN SODIUM (PORCINE) 1000 UNIT/ML IJ SOLN
INTRAMUSCULAR | Status: DC | PRN
  Administered 2023-08-18 (×2): 1600 [IU] via INTRAVENOUS

## 2023-08-18 MED ORDER — HEPARIN SODIUM (PORCINE) 1000 UNIT/ML IJ SOLN
INTRAMUSCULAR | Status: AC
  Filled 2023-08-18: qty 10

## 2023-08-18 MED ORDER — MIDAZOLAM HCL 2 MG/2ML IJ SOLN
INTRAMUSCULAR | Status: DC | PRN
Start: 2023-08-18 — End: 2023-08-18
  Administered 2023-08-18: 1 mg via INTRAVENOUS

## 2023-08-18 MED ORDER — FENTANYL CITRATE (PF) 100 MCG/2ML IJ SOLN
INTRAMUSCULAR | Status: AC
  Filled 2023-08-18: qty 2

## 2023-08-18 SURGICAL SUPPLY — 3 items
CATH PALINDROME-P 19CM W/VT (CATHETERS) IMPLANT
GUIDEWIRE ZIPWIRE .035X260CM (WIRE) IMPLANT
TRAY PV CATH (CUSTOM PROCEDURE TRAY) ×1 IMPLANT

## 2023-08-18 NOTE — Op Note (Signed)
    Patient name: Judy Chang MRN: [id number] DOB: 11-16-1950 Sex: female  08/18/2023 Pre-operative Diagnosis: Malfunctioning right IJ TDC Post-operative diagnosis:  Same Surgeon:  Lonni DOROTHA Gaskins, MD Procedure Performed: 1.  15 minutes of monitored moderate conscious sedation time 2.  Right IJ tunneled dialysis catheter exchange (19 cm Palindrome)  Indications: 73 year old female with end-stage renal disease that presents with malfunctioning right IJ TDC.  She presents for catheter exchange after risks benefits discussed.  Findings:   The right internal jugular vein catheter was exchanged over a wire after the cuff was freed.  We placed a new 19 cm palindrome catheter in the right internal jugular vein with the tip in the right atrium.  This flushed and aspirated easily.   Procedure:  The patient was identified in the holding area and taken to [facility] lab.  Placed on the table in supine position.  The existing right chest wall catheter was prepped and draped in standard sterile fashion.  Timeout was performed.  I went ahead and used 1% lidocaine  without epinephrine  and initially put about 20 mL around the catheter including the exit site and the cuff.  We started dissecting out the cuff and she was quite sensitive.  We elected to give 1 mg of Versed  and 25 mcg of fentanyl  for moderate sedation.  Vital signs were monitored including heart rate, respiratory rate, oxygenation and blood pressure.  I was present for all of moderate sedation I gave an additional 5 cc of local as well.  We freed up the cuff entirely after we put a wire through the existing catheter under fluoroscopy.  The catheter was then completely removed in its entirety and passed off the field.  I placed a new 19 cm palindrome over the wire with the tip in the right atrium.  The wire was removed.  This flushed and aspirated easily.  The catheter exit site was then secured with 3-0 nylon and Dermabond around the catheter.  It  was loaded with heparinized saline according manufactures recommendations     Lonni DOROTHA Gaskins, MD [facility] Office: [phone]

## 2023-08-18 NOTE — Telephone Encounter (Signed)
-----   Message from Lonni JINNY Gaskins sent at 08/18/2023  9:43 AM EDT ----- Patient name: Judy Chang    MRN: [id number]        DOB: 1950/06/13          Sex: female   08/18/2023 Pre-operative Diagnosis: Malfunctioning right IJ TDC Post-operative diagnosis:  Same Surgeon:  Lonni DOROTHA Gaskins, MD Procedure Performed: 1.  15 minutes of monitored moderate conscious sedation time 2.  Right IJ tunneled dialysis catheter exchange (19 cm Palindrome)   #Level 3 H&P plus procedure code.  #Can you also arrange follow-up in the office to discuss new dialysis access?  Needs vein mapping.    Thanks,  [place]

## 2023-08-18 NOTE — H&P (Signed)
 H&P    History of Present Illness: This is a 73 y.o. female with end-stage renal disease that presents for dialysis catheter exchange.  She has a right 19 cm palindrome catheter last exchanged on 05/05/2023.  She states they have been pulling clots.  Previously was attempting PD dialysis but this has been removed.  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   pt unaware   Anxiety    Arthritis    Avascular necrosis (HCC)    Chronic left femoral head   Benign essential HTN 10/23/2015   Chondromalacia 02/25/2018   Noted on MRI: Mild   DDD (degenerative disc disease), lumbar 01/10/2011   Noted on MRI   Dementia [facility])    patient denies   Depression    DJD (degenerative joint disease)    GERD (gastroesophageal reflux disease)    History of cataract    Bilateral   Hypercholesterolemia    Hypertension    Labial tear 02/25/2018   Noted on MRI: smal Left anterior    Lumbar spondylosis 01/10/2011   Noted on MRI   Migraines    Renal cyst 11/17/2015   Notedon US  Abd: Simple, Right   Seizures (HCC)    Spinal stenosis 09/06/2016   Noted on MRI: Moderate-Severe   Stroke (HCC)    Vitreous floater, bilateral     Past Surgical History:  Procedure Laterality Date   CAPD INSERTION N/A 10/13/2022   Procedure: LAPAROSCOPIC INSERTION CONTINUOUS AMBULATORY PERITONEAL DIALYSIS  (CAPD) CATHETER;  Surgeon: Magda Debby SAILOR, MD;  Location: [facility] OR;  Service: Vascular;  Laterality: N/A;   CAPD REMOVAL N/A 11/23/2022   Procedure: LAPAROSCOPIC REMOVAL CONTINUOUS AMBULATORY PERITONEAL DIALYSIS  (CAPD) CATHETER;  Surgeon: Magda Debby SAILOR, MD;  Location: [facility] OR;  Service: Vascular;  Laterality: N/A;   COLONOSCOPY     INSERTION OF DIALYSIS CATHETER Right 11/23/2022   Procedure: INSERTION OF TUNNELED DIALYSIS CATHETER;  Surgeon: Magda Debby SAILOR, MD;  Location: [facility] OR;  Service: Vascular;  Laterality: Right;   IR FLUORO GUIDE CV LINE RIGHT  06/07/2023   PARTIAL  HYSTERECTOMY     ROTATOR CUFF REPAIR Left    TONSILLECTOMY     TOTAL HIP ARTHROPLASTY Left 06/22/2018   Procedure: TOTAL HIP ARTHROPLASTY ANTERIOR APPROACH;  Surgeon: Yvone Rush, MD;  Location: [facility] ORS;  Service: Orthopedics;  Laterality: Left;   TUNNELLED CATHETER EXCHANGE N/A 05/05/2023   Procedure: TUNNELLED CATHETER EXCHANGE;  Surgeon: Tobie Gordy POUR, MD;  Location: [facility] INVASIVE CV LAB;  Service: Cardiovascular;  Laterality: N/A;    Allergies  Allergen Reactions   Citalopram Hydrobromide Hives and Rash   Nsaids Hives and Itching    Burning of the tongue    Pregabalin  Itching    Prior to Admission medications   Medication Sig Start Date End Date Taking? Authorizing Provider  amLODipine  (NORVASC ) 10 MG tablet Take 1 tablet (10 mg total) by mouth daily. 09/18/22   Vann, Jessica U, DO  buPROPion  (WELLBUTRIN  XL) 150 MG 24 hr tablet Take 1 tablet (150 mg total) by mouth every morning. 09/28/22   Vann, Jessica U, DO  carvedilol  (COREG ) 12.5 MG tablet Take 1 tablet (12.5 mg total) by mouth 2 (two) times daily with a meal. 02/17/19   Drusilla Sabas RAMAN, MD  cloNIDine  (CATAPRES  - DOSED IN MG/24 HR) 0.2 mg/24hr patch Place 1 patch (0.2 mg total) onto the skin once a week. 02/23/19   Drusilla Sabas RAMAN, MD  DULoxetine  (CYMBALTA ) 30  MG capsule Take 90 mg by mouth daily. 03/12/21   [provider]  gentamicin cream (GARAMYCIN) 0.1 % Apply 1 Application topically once a week. 11/04/22   [provider]  lactulose (CHRONULAC) 10 GM/15ML solution Take 10 g by mouth daily as needed for mild constipation or moderate constipation. 11/04/22   [provider]  levETIRAcetam  (KEPPRA ) 500 MG tablet Take 1 tablet (500 mg total) by mouth every 12 (twelve) hours. 02/17/19   Drusilla Sabas RAMAN, MD  Oxycodone  HCl 20 MG TABS Take 1.5 tablets (30 mg total) by mouth every 6 (six) hours as needed. 11/23/22   Bethanie Cough, PA-C  sodium zirconium cyclosilicate  (LOKELMA ) 10 g PACK packet Take one 10 g packet and 8  ounces of water  prior to going to dialysis tomorrow morning 12/06/22   Harris, Abigail, PA-C  spironolactone (ALDACTONE) 25 MG tablet Take 25 mg by mouth daily. 09/22/22   [provider]  traZODone  (DESYREL ) 100 MG tablet Take 100 mg by mouth at bedtime. 03/12/21   [provider]    Social History   Socioeconomic History   Marital status: Married    Spouse name: Not on file   Number of children: 0   Years of education: Not on file   Highest education level: High school graduate  Occupational History   Not on file  Tobacco Use   Smoking status: Former    Types: Cigarettes   Smokeless tobacco: Never   Tobacco comments:    off and on  asmoked for many years, stopped ~2021  Vaping Use   Vaping status: Never Used  Substance and Sexual Activity   Alcohol use: Not Currently    Comment: every few days, beer   Drug use: Not Currently    Types: Marijuana    Comment: last time 06/13/2018   Sexual activity: Not on file  Other Topics Concern   Not on file  Social History Narrative   Lives with husband   Right handed   Caffeine: decaf coffee everyday, 2-3 cups   Social Drivers of Corporate investment banker Strain: Not on file  Food Insecurity: No Food Insecurity (09/17/2022)   Hunger Vital Sign    Worried About Running Out of Food in the Last Year: Never true    Ran Out of Food in the Last Year: Never true  Transportation Needs: No Transportation Needs (09/17/2022)   PRAPARE - Administrator, Civil Service (Medical): No    Lack of Transportation (Non-Medical): No  Physical Activity: Not on file  Stress: Not on file  Social Connections: Not on file  Intimate Partner Violence: Not At Risk (09/17/2022)   Humiliation, Afraid, Rape, and Kick questionnaire    Fear of Current or Ex-Partner: No    Emotionally Abused: No    Physically Abused: No    Sexually Abused: No     Family History  Problem Relation Age of Onset   Hypertension Mother     Hypertension Father     ROS: [x]  Positive   [ ]  Negative   [ ]  All sytems reviewed and are negative  Cardiovascular: []  chest pain/pressure []  palpitations []  SOB lying flat []  DOE []  pain in legs while walking []  pain in legs at rest []  pain in legs at night []  non-healing ulcers []  hx of DVT []  swelling in legs  Pulmonary: []  productive cough []  asthma/wheezing []  home O2  Neurologic: []  weakness in []  arms []  legs []  numbness in []   arms []  legs []  hx of CVA []  mini stroke [] difficulty speaking or slurred speech []  temporary loss of vision in one eye []  dizziness  Hematologic: []  hx of cancer []  bleeding problems []  problems with blood clotting easily  Endocrine:   []  diabetes []  thyroid  disease  GI []  vomiting blood []  blood in stool  GU: []  CKD/renal failure []  HD--[]  M/W/F or []  T/T/S []  burning with urination []  blood in urine  Psychiatric: []  anxiety []  depression  Musculoskeletal: []  arthritis []  joint pain  Integumentary: []  rashes []  ulcers  Constitutional: []  fever []  chills   Physical Examination  Vitals:   08/18/23 0821 08/18/23 0827  BP: (!) 207/92 (!) 207/92  Pulse: 63 60  Resp: 12 14  Temp: 98.1 F (36.7 C)   SpO2: 96% 97%   There is no height or weight on file to calculate BMI.  General:  WDWN in NAD Gait: Not observed HENT: WNL, normocephalic Pulmonary: normal non-labored breathing Cardiac: regular, without  Murmurs, rubs or gallops Abdomen:  soft, NT/ND Vascular Exam/Pulses: RIJ TDC Neurologic: A&O X 3; Appropriate Affect ; SENSATION: normal; MOTOR FUNCTION:  moving all extremities equally. Speech is fluent/normal   CBC    Component Value Date/Time   WBC 8.8 12/06/2022 1413   RBC 3.06 (L) 12/06/2022 1413   HGB 7.9 (L) 12/06/2022 1413   HCT 26.5 (L) 12/06/2022 1413   PLT 335 12/06/2022 1413   MCV 86.6 12/06/2022 1413   MCH 25.8 (L) 12/06/2022 1413   MCHC 29.8 (L) 12/06/2022 1413   RDW 19.8 (H)  12/06/2022 1413   LYMPHSABS 1.4 09/17/2022 0628   MONOABS 0.7 09/17/2022 0628   EOSABS 0.2 09/17/2022 0628   BASOSABS 0.1 09/17/2022 0628    BMET    Component Value Date/Time   NA 133 (L) 12/06/2022 1413   NA 141 10/04/2021 1504   K 6.4 (HH) 12/06/2022 1413   CL 106 12/06/2022 1413   CO2 18 (L) 12/06/2022 1413   GLUCOSE 61 (L) 12/06/2022 1413   BUN 37 (H) 12/06/2022 1413   BUN 23 10/04/2021 1504   CREATININE 4.66 (H) 12/06/2022 1413   CALCIUM 9.1 12/06/2022 1413   GFRNONAA 9 (L) 12/06/2022 1413   GFRAA 46 (L) 02/14/2019 0412    COAGS: Lab Results  Component Value Date   INR 1.0 09/17/2022   INR 0.9 09/15/2021   INR 1.0 06/14/2018     Non-Invasive Vascular Imaging:    N/A   ASSESSMENT/PLAN: This is a14 y.o. female with end-stage renal disease that presents for dialysis catheter exchange.  She has a right 19 cm palindrome catheter last exchanged on 05/05/2023.  She states they have been pulling clots.  Discussed plan for [facility] exchange.  Will arrange outpatient follow-up in the office to discuss AV fistula placement with vein mapping.  Previously was attempting PD dialysis but this has been removed.  Lonni DOROTHA Gaskins, MD [facility] Office: [phone]  Lonni JINNY Gaskins

## 2023-08-19 DIAGNOSIS — T8249XA Other complication of vascular dialysis catheter, initial encounter: Secondary | ICD-10-CM | POA: Diagnosis not present

## 2023-08-19 DIAGNOSIS — R519 Headache, unspecified: Secondary | ICD-10-CM | POA: Diagnosis not present

## 2023-08-19 DIAGNOSIS — Z992 Dependence on renal dialysis: Secondary | ICD-10-CM | POA: Diagnosis not present

## 2023-08-19 DIAGNOSIS — D509 Iron deficiency anemia, unspecified: Secondary | ICD-10-CM | POA: Diagnosis not present

## 2023-08-19 DIAGNOSIS — N186 End stage renal disease: Secondary | ICD-10-CM | POA: Diagnosis not present

## 2023-08-19 DIAGNOSIS — N2581 Secondary hyperparathyroidism of renal origin: Secondary | ICD-10-CM | POA: Diagnosis not present

## 2023-08-19 DIAGNOSIS — E1022 Type 1 diabetes mellitus with diabetic chronic kidney disease: Secondary | ICD-10-CM | POA: Diagnosis not present

## 2023-08-19 DIAGNOSIS — D631 Anemia in chronic kidney disease: Secondary | ICD-10-CM | POA: Diagnosis not present

## 2023-08-19 DIAGNOSIS — D689 Coagulation defect, unspecified: Secondary | ICD-10-CM | POA: Diagnosis not present

## 2023-08-21 ENCOUNTER — Encounter (HOSPITAL_COMMUNITY): Payer: Self-pay | Admitting: Vascular Surgery

## 2023-08-22 DIAGNOSIS — D631 Anemia in chronic kidney disease: Secondary | ICD-10-CM | POA: Diagnosis not present

## 2023-08-22 DIAGNOSIS — N2581 Secondary hyperparathyroidism of renal origin: Secondary | ICD-10-CM | POA: Diagnosis not present

## 2023-08-22 DIAGNOSIS — N186 End stage renal disease: Secondary | ICD-10-CM | POA: Diagnosis not present

## 2023-08-22 DIAGNOSIS — E1022 Type 1 diabetes mellitus with diabetic chronic kidney disease: Secondary | ICD-10-CM | POA: Diagnosis not present

## 2023-08-22 DIAGNOSIS — Z992 Dependence on renal dialysis: Secondary | ICD-10-CM | POA: Diagnosis not present

## 2023-08-22 DIAGNOSIS — T8249XA Other complication of vascular dialysis catheter, initial encounter: Secondary | ICD-10-CM | POA: Diagnosis not present

## 2023-08-22 DIAGNOSIS — R519 Headache, unspecified: Secondary | ICD-10-CM | POA: Diagnosis not present

## 2023-08-22 DIAGNOSIS — D509 Iron deficiency anemia, unspecified: Secondary | ICD-10-CM | POA: Diagnosis not present

## 2023-08-22 DIAGNOSIS — D689 Coagulation defect, unspecified: Secondary | ICD-10-CM | POA: Diagnosis not present

## 2023-08-24 DIAGNOSIS — D509 Iron deficiency anemia, unspecified: Secondary | ICD-10-CM | POA: Diagnosis not present

## 2023-08-24 DIAGNOSIS — E1022 Type 1 diabetes mellitus with diabetic chronic kidney disease: Secondary | ICD-10-CM | POA: Diagnosis not present

## 2023-08-24 DIAGNOSIS — N186 End stage renal disease: Secondary | ICD-10-CM | POA: Diagnosis not present

## 2023-08-24 DIAGNOSIS — N2581 Secondary hyperparathyroidism of renal origin: Secondary | ICD-10-CM | POA: Diagnosis not present

## 2023-08-24 DIAGNOSIS — R519 Headache, unspecified: Secondary | ICD-10-CM | POA: Diagnosis not present

## 2023-08-24 DIAGNOSIS — D689 Coagulation defect, unspecified: Secondary | ICD-10-CM | POA: Diagnosis not present

## 2023-08-24 DIAGNOSIS — D631 Anemia in chronic kidney disease: Secondary | ICD-10-CM | POA: Diagnosis not present

## 2023-08-24 DIAGNOSIS — T8249XA Other complication of vascular dialysis catheter, initial encounter: Secondary | ICD-10-CM | POA: Diagnosis not present

## 2023-08-24 DIAGNOSIS — Z992 Dependence on renal dialysis: Secondary | ICD-10-CM | POA: Diagnosis not present

## 2023-08-26 DIAGNOSIS — D631 Anemia in chronic kidney disease: Secondary | ICD-10-CM | POA: Diagnosis not present

## 2023-08-26 DIAGNOSIS — T8249XA Other complication of vascular dialysis catheter, initial encounter: Secondary | ICD-10-CM | POA: Diagnosis not present

## 2023-08-26 DIAGNOSIS — N186 End stage renal disease: Secondary | ICD-10-CM | POA: Diagnosis not present

## 2023-08-26 DIAGNOSIS — N2581 Secondary hyperparathyroidism of renal origin: Secondary | ICD-10-CM | POA: Diagnosis not present

## 2023-08-26 DIAGNOSIS — D509 Iron deficiency anemia, unspecified: Secondary | ICD-10-CM | POA: Diagnosis not present

## 2023-08-26 DIAGNOSIS — Z992 Dependence on renal dialysis: Secondary | ICD-10-CM | POA: Diagnosis not present

## 2023-08-26 DIAGNOSIS — D689 Coagulation defect, unspecified: Secondary | ICD-10-CM | POA: Diagnosis not present

## 2023-08-29 DIAGNOSIS — N186 End stage renal disease: Secondary | ICD-10-CM | POA: Diagnosis not present

## 2023-08-29 DIAGNOSIS — Z992 Dependence on renal dialysis: Secondary | ICD-10-CM | POA: Diagnosis not present

## 2023-08-29 DIAGNOSIS — N2581 Secondary hyperparathyroidism of renal origin: Secondary | ICD-10-CM | POA: Diagnosis not present

## 2023-08-29 DIAGNOSIS — D631 Anemia in chronic kidney disease: Secondary | ICD-10-CM | POA: Diagnosis not present

## 2023-08-29 DIAGNOSIS — D689 Coagulation defect, unspecified: Secondary | ICD-10-CM | POA: Diagnosis not present

## 2023-08-29 DIAGNOSIS — D509 Iron deficiency anemia, unspecified: Secondary | ICD-10-CM | POA: Diagnosis not present

## 2023-08-29 DIAGNOSIS — T8249XA Other complication of vascular dialysis catheter, initial encounter: Secondary | ICD-10-CM | POA: Diagnosis not present

## 2023-08-31 DIAGNOSIS — N2581 Secondary hyperparathyroidism of renal origin: Secondary | ICD-10-CM | POA: Diagnosis not present

## 2023-08-31 DIAGNOSIS — T8249XA Other complication of vascular dialysis catheter, initial encounter: Secondary | ICD-10-CM | POA: Diagnosis not present

## 2023-08-31 DIAGNOSIS — D509 Iron deficiency anemia, unspecified: Secondary | ICD-10-CM | POA: Diagnosis not present

## 2023-08-31 DIAGNOSIS — D689 Coagulation defect, unspecified: Secondary | ICD-10-CM | POA: Diagnosis not present

## 2023-08-31 DIAGNOSIS — D631 Anemia in chronic kidney disease: Secondary | ICD-10-CM | POA: Diagnosis not present

## 2023-08-31 DIAGNOSIS — Z992 Dependence on renal dialysis: Secondary | ICD-10-CM | POA: Diagnosis not present

## 2023-08-31 DIAGNOSIS — N186 End stage renal disease: Secondary | ICD-10-CM | POA: Diagnosis not present

## 2023-09-05 DIAGNOSIS — D689 Coagulation defect, unspecified: Secondary | ICD-10-CM | POA: Diagnosis not present

## 2023-09-05 DIAGNOSIS — D631 Anemia in chronic kidney disease: Secondary | ICD-10-CM | POA: Diagnosis not present

## 2023-09-05 DIAGNOSIS — Z992 Dependence on renal dialysis: Secondary | ICD-10-CM | POA: Diagnosis not present

## 2023-09-05 DIAGNOSIS — N186 End stage renal disease: Secondary | ICD-10-CM | POA: Diagnosis not present

## 2023-09-05 DIAGNOSIS — N2581 Secondary hyperparathyroidism of renal origin: Secondary | ICD-10-CM | POA: Diagnosis not present

## 2023-09-05 DIAGNOSIS — D509 Iron deficiency anemia, unspecified: Secondary | ICD-10-CM | POA: Diagnosis not present

## 2023-09-05 DIAGNOSIS — T8249XA Other complication of vascular dialysis catheter, initial encounter: Secondary | ICD-10-CM | POA: Diagnosis not present

## 2023-09-07 DIAGNOSIS — N2581 Secondary hyperparathyroidism of renal origin: Secondary | ICD-10-CM | POA: Diagnosis not present

## 2023-09-07 DIAGNOSIS — N186 End stage renal disease: Secondary | ICD-10-CM | POA: Diagnosis not present

## 2023-09-07 DIAGNOSIS — T8249XA Other complication of vascular dialysis catheter, initial encounter: Secondary | ICD-10-CM | POA: Diagnosis not present

## 2023-09-07 DIAGNOSIS — D509 Iron deficiency anemia, unspecified: Secondary | ICD-10-CM | POA: Diagnosis not present

## 2023-09-07 DIAGNOSIS — Z992 Dependence on renal dialysis: Secondary | ICD-10-CM | POA: Diagnosis not present

## 2023-09-07 DIAGNOSIS — D631 Anemia in chronic kidney disease: Secondary | ICD-10-CM | POA: Diagnosis not present

## 2023-09-07 DIAGNOSIS — D689 Coagulation defect, unspecified: Secondary | ICD-10-CM | POA: Diagnosis not present

## 2023-09-09 DIAGNOSIS — D689 Coagulation defect, unspecified: Secondary | ICD-10-CM | POA: Diagnosis not present

## 2023-09-09 DIAGNOSIS — N2581 Secondary hyperparathyroidism of renal origin: Secondary | ICD-10-CM | POA: Diagnosis not present

## 2023-09-09 DIAGNOSIS — D509 Iron deficiency anemia, unspecified: Secondary | ICD-10-CM | POA: Diagnosis not present

## 2023-09-09 DIAGNOSIS — N186 End stage renal disease: Secondary | ICD-10-CM | POA: Diagnosis not present

## 2023-09-09 DIAGNOSIS — Z992 Dependence on renal dialysis: Secondary | ICD-10-CM | POA: Diagnosis not present

## 2023-09-09 DIAGNOSIS — D631 Anemia in chronic kidney disease: Secondary | ICD-10-CM | POA: Diagnosis not present

## 2023-09-09 DIAGNOSIS — T8249XA Other complication of vascular dialysis catheter, initial encounter: Secondary | ICD-10-CM | POA: Diagnosis not present

## 2023-09-12 DIAGNOSIS — D509 Iron deficiency anemia, unspecified: Secondary | ICD-10-CM | POA: Diagnosis not present

## 2023-09-12 DIAGNOSIS — D689 Coagulation defect, unspecified: Secondary | ICD-10-CM | POA: Diagnosis not present

## 2023-09-12 DIAGNOSIS — Z992 Dependence on renal dialysis: Secondary | ICD-10-CM | POA: Diagnosis not present

## 2023-09-12 DIAGNOSIS — D631 Anemia in chronic kidney disease: Secondary | ICD-10-CM | POA: Diagnosis not present

## 2023-09-12 DIAGNOSIS — T8249XA Other complication of vascular dialysis catheter, initial encounter: Secondary | ICD-10-CM | POA: Diagnosis not present

## 2023-09-12 DIAGNOSIS — N2581 Secondary hyperparathyroidism of renal origin: Secondary | ICD-10-CM | POA: Diagnosis not present

## 2023-09-12 DIAGNOSIS — N186 End stage renal disease: Secondary | ICD-10-CM | POA: Diagnosis not present

## 2023-09-14 ENCOUNTER — Emergency Department (HOSPITAL_COMMUNITY)

## 2023-09-14 ENCOUNTER — Inpatient Hospital Stay (HOSPITAL_COMMUNITY)

## 2023-09-14 ENCOUNTER — Encounter (HOSPITAL_COMMUNITY): Payer: Self-pay

## 2023-09-14 ENCOUNTER — Other Ambulatory Visit: Payer: Self-pay

## 2023-09-14 ENCOUNTER — Inpatient Hospital Stay (HOSPITAL_COMMUNITY)
Admission: EM | Admit: 2023-09-14 | Discharge: 2023-09-25 | DRG: 296 | Disposition: E | Attending: Pulmonary Disease | Admitting: Pulmonary Disease

## 2023-09-14 DIAGNOSIS — E876 Hypokalemia: Secondary | ICD-10-CM | POA: Diagnosis present

## 2023-09-14 DIAGNOSIS — N186 End stage renal disease: Secondary | ICD-10-CM | POA: Diagnosis not present

## 2023-09-14 DIAGNOSIS — D631 Anemia in chronic kidney disease: Secondary | ICD-10-CM | POA: Diagnosis present

## 2023-09-14 DIAGNOSIS — G4089 Other seizures: Secondary | ICD-10-CM | POA: Diagnosis not present

## 2023-09-14 DIAGNOSIS — I161 Hypertensive emergency: Secondary | ICD-10-CM | POA: Diagnosis not present

## 2023-09-14 DIAGNOSIS — E871 Hypo-osmolality and hyponatremia: Secondary | ICD-10-CM | POA: Diagnosis present

## 2023-09-14 DIAGNOSIS — Z992 Dependence on renal dialysis: Secondary | ICD-10-CM

## 2023-09-14 DIAGNOSIS — G931 Anoxic brain damage, not elsewhere classified: Secondary | ICD-10-CM | POA: Diagnosis not present

## 2023-09-14 DIAGNOSIS — I10 Essential (primary) hypertension: Secondary | ICD-10-CM | POA: Diagnosis not present

## 2023-09-14 DIAGNOSIS — J9601 Acute respiratory failure with hypoxia: Secondary | ICD-10-CM | POA: Diagnosis present

## 2023-09-14 DIAGNOSIS — Z66 Do not resuscitate: Secondary | ICD-10-CM | POA: Diagnosis not present

## 2023-09-14 DIAGNOSIS — I6782 Cerebral ischemia: Secondary | ICD-10-CM | POA: Diagnosis not present

## 2023-09-14 DIAGNOSIS — F32A Depression, unspecified: Secondary | ICD-10-CM | POA: Diagnosis present

## 2023-09-14 DIAGNOSIS — E872 Acidosis, unspecified: Secondary | ICD-10-CM | POA: Diagnosis not present

## 2023-09-14 DIAGNOSIS — E875 Hyperkalemia: Secondary | ICD-10-CM | POA: Diagnosis present

## 2023-09-14 DIAGNOSIS — R64 Cachexia: Secondary | ICD-10-CM | POA: Diagnosis not present

## 2023-09-14 DIAGNOSIS — K5900 Constipation, unspecified: Secondary | ICD-10-CM | POA: Diagnosis not present

## 2023-09-14 DIAGNOSIS — N2581 Secondary hyperparathyroidism of renal origin: Secondary | ICD-10-CM | POA: Diagnosis not present

## 2023-09-14 DIAGNOSIS — R403 Persistent vegetative state: Secondary | ICD-10-CM | POA: Diagnosis present

## 2023-09-14 DIAGNOSIS — Z681 Body mass index (BMI) 19 or less, adult: Secondary | ICD-10-CM | POA: Diagnosis not present

## 2023-09-14 DIAGNOSIS — G253 Myoclonus: Secondary | ICD-10-CM | POA: Diagnosis present

## 2023-09-14 DIAGNOSIS — W19XXXA Unspecified fall, initial encounter: Secondary | ICD-10-CM | POA: Diagnosis not present

## 2023-09-14 DIAGNOSIS — N179 Acute kidney failure, unspecified: Secondary | ICD-10-CM | POA: Diagnosis not present

## 2023-09-14 DIAGNOSIS — Z6820 Body mass index (BMI) 20.0-20.9, adult: Secondary | ICD-10-CM

## 2023-09-14 DIAGNOSIS — Z4682 Encounter for fitting and adjustment of non-vascular catheter: Secondary | ICD-10-CM | POA: Diagnosis not present

## 2023-09-14 DIAGNOSIS — R578 Other shock: Secondary | ICD-10-CM | POA: Diagnosis not present

## 2023-09-14 DIAGNOSIS — S199XXA Unspecified injury of neck, initial encounter: Secondary | ICD-10-CM | POA: Diagnosis not present

## 2023-09-14 DIAGNOSIS — R579 Shock, unspecified: Secondary | ICD-10-CM | POA: Diagnosis not present

## 2023-09-14 DIAGNOSIS — Z87891 Personal history of nicotine dependence: Secondary | ICD-10-CM

## 2023-09-14 DIAGNOSIS — Z91158 Patient's noncompliance with renal dialysis for other reason: Secondary | ICD-10-CM

## 2023-09-14 DIAGNOSIS — I700 Atherosclerosis of aorta: Secondary | ICD-10-CM | POA: Diagnosis not present

## 2023-09-14 DIAGNOSIS — F0394 Unspecified dementia, unspecified severity, with anxiety: Secondary | ICD-10-CM | POA: Diagnosis present

## 2023-09-14 DIAGNOSIS — Z515 Encounter for palliative care: Secondary | ICD-10-CM

## 2023-09-14 DIAGNOSIS — R9082 White matter disease, unspecified: Secondary | ICD-10-CM | POA: Diagnosis not present

## 2023-09-14 DIAGNOSIS — Z888 Allergy status to other drugs, medicaments and biological substances status: Secondary | ICD-10-CM

## 2023-09-14 DIAGNOSIS — R404 Transient alteration of awareness: Secondary | ICD-10-CM | POA: Diagnosis not present

## 2023-09-14 DIAGNOSIS — Z9911 Dependence on respirator [ventilator] status: Secondary | ICD-10-CM | POA: Diagnosis not present

## 2023-09-14 DIAGNOSIS — T447X6A Underdosing of beta-adrenoreceptor antagonists, initial encounter: Secondary | ICD-10-CM | POA: Diagnosis present

## 2023-09-14 DIAGNOSIS — Z886 Allergy status to analgesic agent status: Secondary | ICD-10-CM

## 2023-09-14 DIAGNOSIS — Z90711 Acquired absence of uterus with remaining cervical stump: Secondary | ICD-10-CM

## 2023-09-14 DIAGNOSIS — R569 Unspecified convulsions: Secondary | ICD-10-CM | POA: Diagnosis not present

## 2023-09-14 DIAGNOSIS — T461X6A Underdosing of calcium-channel blockers, initial encounter: Secondary | ICD-10-CM | POA: Diagnosis present

## 2023-09-14 DIAGNOSIS — W44F3XA Food entering into or through a natural orifice, initial encounter: Secondary | ICD-10-CM | POA: Diagnosis not present

## 2023-09-14 DIAGNOSIS — R9089 Other abnormal findings on diagnostic imaging of central nervous system: Secondary | ICD-10-CM | POA: Diagnosis not present

## 2023-09-14 DIAGNOSIS — F411 Generalized anxiety disorder: Secondary | ICD-10-CM | POA: Diagnosis present

## 2023-09-14 DIAGNOSIS — M549 Dorsalgia, unspecified: Secondary | ICD-10-CM | POA: Diagnosis present

## 2023-09-14 DIAGNOSIS — E8729 Other acidosis: Secondary | ICD-10-CM

## 2023-09-14 DIAGNOSIS — I120 Hypertensive chronic kidney disease with stage 5 chronic kidney disease or end stage renal disease: Secondary | ICD-10-CM | POA: Diagnosis present

## 2023-09-14 DIAGNOSIS — M47812 Spondylosis without myelopathy or radiculopathy, cervical region: Secondary | ICD-10-CM | POA: Diagnosis not present

## 2023-09-14 DIAGNOSIS — G8929 Other chronic pain: Secondary | ICD-10-CM | POA: Diagnosis present

## 2023-09-14 DIAGNOSIS — I499 Cardiac arrhythmia, unspecified: Secondary | ICD-10-CM | POA: Diagnosis not present

## 2023-09-14 DIAGNOSIS — I272 Pulmonary hypertension, unspecified: Secondary | ICD-10-CM | POA: Diagnosis present

## 2023-09-14 DIAGNOSIS — R000 Tachycardia, unspecified: Secondary | ICD-10-CM | POA: Diagnosis not present

## 2023-09-14 DIAGNOSIS — I469 Cardiac arrest, cause unspecified: Secondary | ICD-10-CM

## 2023-09-14 DIAGNOSIS — R001 Bradycardia, unspecified: Secondary | ICD-10-CM | POA: Diagnosis not present

## 2023-09-14 DIAGNOSIS — S0990XA Unspecified injury of head, initial encounter: Secondary | ICD-10-CM | POA: Diagnosis not present

## 2023-09-14 DIAGNOSIS — T465X6A Underdosing of other antihypertensive drugs, initial encounter: Secondary | ICD-10-CM | POA: Diagnosis present

## 2023-09-14 DIAGNOSIS — Z8249 Family history of ischemic heart disease and other diseases of the circulatory system: Secondary | ICD-10-CM

## 2023-09-14 DIAGNOSIS — Z91138 Patient's unintentional underdosing of medication regimen for other reason: Secondary | ICD-10-CM

## 2023-09-14 DIAGNOSIS — T17928A Food in respiratory tract, part unspecified causing other injury, initial encounter: Secondary | ICD-10-CM | POA: Diagnosis not present

## 2023-09-14 DIAGNOSIS — Z79899 Other long term (current) drug therapy: Secondary | ICD-10-CM

## 2023-09-14 DIAGNOSIS — E7800 Pure hypercholesterolemia, unspecified: Secondary | ICD-10-CM | POA: Diagnosis present

## 2023-09-14 DIAGNOSIS — Z96642 Presence of left artificial hip joint: Secondary | ICD-10-CM | POA: Diagnosis present

## 2023-09-14 DIAGNOSIS — Z8673 Personal history of transient ischemic attack (TIA), and cerebral infarction without residual deficits: Secondary | ICD-10-CM

## 2023-09-14 DIAGNOSIS — R140 Abdominal distension (gaseous): Secondary | ICD-10-CM | POA: Diagnosis not present

## 2023-09-14 DIAGNOSIS — R918 Other nonspecific abnormal finding of lung field: Secondary | ICD-10-CM | POA: Diagnosis not present

## 2023-09-14 LAB — I-STAT ARTERIAL BLOOD GAS, ED
Acid-Base Excess: 3 mmol/L — ABNORMAL HIGH (ref 0.0–2.0)
Bicarbonate: 28.9 mmol/L — ABNORMAL HIGH (ref 20.0–28.0)
Calcium, Ion: 1.27 mmol/L (ref 1.15–1.40)
HCT: 36 % (ref 36.0–46.0)
Hemoglobin: 12.2 g/dL (ref 12.0–15.0)
O2 Saturation: 100 %
Potassium: 3.3 mmol/L — ABNORMAL LOW (ref 3.5–5.1)
Sodium: 136 mmol/L (ref 135–145)
TCO2: 30 mmol/L (ref 22–32)
pCO2 arterial: 47.7 mmHg (ref 32–48)
pH, Arterial: 7.39 (ref 7.35–7.45)
pO2, Arterial: 531 mmHg — ABNORMAL HIGH (ref 83–108)

## 2023-09-14 LAB — COMPREHENSIVE METABOLIC PANEL WITH GFR
ALT: 22 U/L (ref 0–44)
AST: 46 U/L — ABNORMAL HIGH (ref 15–41)
Albumin: 2.8 g/dL — ABNORMAL LOW (ref 3.5–5.0)
Alkaline Phosphatase: 99 U/L (ref 38–126)
Anion gap: 20 — ABNORMAL HIGH (ref 5–15)
BUN: 22 mg/dL (ref 8–23)
CO2: 19 mmol/L — ABNORMAL LOW (ref 22–32)
Calcium: 9 mg/dL (ref 8.9–10.3)
Chloride: 103 mmol/L (ref 98–111)
Creatinine, Ser: 6.97 mg/dL — ABNORMAL HIGH (ref 0.44–1.00)
GFR, Estimated: 6 mL/min — ABNORMAL LOW (ref >60)
Glucose, Bld: 159 mg/dL — ABNORMAL HIGH (ref 70–99)
Potassium: 4 mmol/L (ref 3.5–5.1)
Sodium: 142 mmol/L (ref 135–145)
Total Bilirubin: 0.7 mg/dL (ref 0.0–1.2)
Total Protein: 6.2 g/dL — ABNORMAL LOW (ref 6.5–8.1)

## 2023-09-14 LAB — CBC
HCT: 42 % (ref 36.0–46.0)
Hemoglobin: 12.7 g/dL (ref 12.0–15.0)
MCH: 28.9 pg (ref 26.0–34.0)
MCHC: 30.2 g/dL (ref 30.0–36.0)
MCV: 95.7 fL (ref 80.0–100.0)
Platelets: 155 K/uL (ref 150–400)
RBC: 4.39 MIL/uL (ref 3.87–5.11)
RDW: 17.3 % — ABNORMAL HIGH (ref 11.5–15.5)
WBC: 12.9 K/uL — ABNORMAL HIGH (ref 4.0–10.5)
nRBC: 0 % (ref 0.0–0.2)

## 2023-09-14 LAB — ECHOCARDIOGRAM COMPLETE
Area-P 1/2: 3.31 cm2
Calc EF: 60.2 %
Height: 60 in
S' Lateral: 2.7 cm
Single Plane A2C EF: 57.1 %
Single Plane A4C EF: 64.5 %
Weight: 1569.68 [oz_av]

## 2023-09-14 LAB — I-STAT CG4 LACTIC ACID, ED
Lactic Acid, Venous: 10.6 mmol/L (ref 0.5–1.9)
Lactic Acid, Venous: 5.5 mmol/L (ref 0.5–1.9)

## 2023-09-14 LAB — CBC WITH DIFFERENTIAL/PLATELET
Basophils Absolute: 0 K/uL (ref 0.0–0.1)
Basophils Relative: 0 %
Eosinophils Absolute: 0.6 K/uL — ABNORMAL HIGH (ref 0.0–0.5)
Eosinophils Relative: 4 %
HCT: 39.3 % (ref 36.0–46.0)
Hemoglobin: 11.5 g/dL — ABNORMAL LOW (ref 12.0–15.0)
Lymphocytes Relative: 48 %
Lymphs Abs: 6.6 K/uL — ABNORMAL HIGH (ref 0.7–4.0)
MCH: 28.9 pg (ref 26.0–34.0)
MCHC: 29.3 g/dL — ABNORMAL LOW (ref 30.0–36.0)
MCV: 98.7 fL (ref 80.0–100.0)
Monocytes Absolute: 0.4 K/uL (ref 0.1–1.0)
Monocytes Relative: 3 %
Neutro Abs: 6.2 K/uL (ref 1.7–7.7)
Neutrophils Relative %: 45 %
Platelets: 150 K/uL (ref 150–400)
RBC: 3.98 MIL/uL (ref 3.87–5.11)
RDW: 17.1 % — ABNORMAL HIGH (ref 11.5–15.5)
WBC: 13.8 K/uL — ABNORMAL HIGH (ref 4.0–10.5)
nRBC: 0 % (ref 0.0–0.2)

## 2023-09-14 LAB — GLUCOSE, CAPILLARY
Glucose-Capillary: 107 mg/dL — ABNORMAL HIGH (ref 70–99)
Glucose-Capillary: 125 mg/dL — ABNORMAL HIGH (ref 70–99)
Glucose-Capillary: 143 mg/dL — ABNORMAL HIGH (ref 70–99)
Glucose-Capillary: 88 mg/dL (ref 70–99)
Glucose-Capillary: 89 mg/dL (ref 70–99)

## 2023-09-14 LAB — HEPATITIS B SURFACE ANTIGEN: Hepatitis B Surface Ag: NONREACTIVE

## 2023-09-14 LAB — I-STAT CHEM 8, ED
BUN: 36 mg/dL — ABNORMAL HIGH (ref 8–23)
Calcium, Ion: 1.17 mmol/L (ref 1.15–1.40)
Chloride: 103 mmol/L (ref 98–111)
Creatinine, Ser: 7.5 mg/dL — ABNORMAL HIGH (ref 0.44–1.00)
Glucose, Bld: 171 mg/dL — ABNORMAL HIGH (ref 70–99)
HCT: 41 % (ref 36.0–46.0)
Hemoglobin: 13.9 g/dL (ref 12.0–15.0)
Potassium: 7 mmol/L (ref 3.5–5.1)
Sodium: 134 mmol/L — ABNORMAL LOW (ref 135–145)
TCO2: 25 mmol/L (ref 22–32)

## 2023-09-14 LAB — CREATININE, SERUM
Creatinine, Ser: 5.45 mg/dL — ABNORMAL HIGH (ref 0.44–1.00)
GFR, Estimated: 8 mL/min — ABNORMAL LOW (ref >60)

## 2023-09-14 LAB — TROPONIN I (HIGH SENSITIVITY)
Troponin I (High Sensitivity): 78 ng/L — ABNORMAL HIGH (ref <18)
Troponin I (High Sensitivity): 166 ng/L (ref <18)
Troponin I (High Sensitivity): 603 ng/L (ref <18)

## 2023-09-14 LAB — MRSA NEXT GEN BY PCR, NASAL

## 2023-09-14 LAB — LACTIC ACID, PLASMA: Lactic Acid, Venous: 1.2 mmol/L (ref 0.5–1.9)

## 2023-09-14 LAB — MAGNESIUM: Magnesium: 2.3 mg/dL (ref 1.7–2.4)

## 2023-09-14 MED ORDER — CLEVIDIPINE BUTYRATE 0.5 MG/ML IV EMUL
0.0000 mg/h | INTRAVENOUS | Status: DC
  Administered 2023-09-14: 2 mg/h via INTRAVENOUS
  Filled 2023-09-14: qty 100

## 2023-09-14 MED ORDER — PROPOFOL 1000 MG/100ML IV EMUL
0.0000 ug/kg/min | INTRAVENOUS | Status: DC
  Administered 2023-09-14: 30 ug/kg/min via INTRAVENOUS
  Administered 2023-09-14: 25 ug/kg/min via INTRAVENOUS
  Administered 2023-09-15: 80 ug/kg/min via INTRAVENOUS
  Administered 2023-09-15: 60 ug/kg/min via INTRAVENOUS
  Administered 2023-09-15: 50 ug/kg/min via INTRAVENOUS
  Administered 2023-09-15: 60 ug/kg/min via INTRAVENOUS
  Filled 2023-09-14 (×6): qty 100

## 2023-09-14 MED ORDER — SODIUM ZIRCONIUM CYCLOSILICATE 10 G PO PACK
10.0000 g | PACK | ORAL | Status: AC
  Administered 2023-09-14: 10 g
  Filled 2023-09-14: qty 1

## 2023-09-14 MED ORDER — PROPOFOL 1000 MG/100ML IV EMUL
INTRAVENOUS | Status: AC
  Filled 2023-09-14: qty 100

## 2023-09-14 MED ORDER — CHLORHEXIDINE GLUCONATE CLOTH 2 % EX PADS
6.0000 | MEDICATED_PAD | Freq: Every day | CUTANEOUS | Status: DC
  Administered 2023-09-15: 6 via TOPICAL

## 2023-09-14 MED ORDER — FAMOTIDINE 20 MG PO TABS: 20.0000 mg | ORAL_TABLET | Freq: Two times a day (BID) | ORAL | Status: DC

## 2023-09-14 MED ORDER — LEVETIRACETAM (KEPPRA) 500 MG/5 ML ADULT IV PUSH
500.0000 mg | Freq: Two times a day (BID) | INTRAVENOUS | Status: DC
  Administered 2023-09-14: 500 mg via INTRAVENOUS
  Filled 2023-09-14: qty 5

## 2023-09-14 MED ORDER — FAMOTIDINE 20 MG PO TABS
10.0000 mg | ORAL_TABLET | Freq: Every day | ORAL | Status: DC
  Administered 2023-09-14 – 2023-09-19 (×6): 10 mg
  Filled 2023-09-14 (×6): qty 1

## 2023-09-14 MED ORDER — LEVETIRACETAM (KEPPRA) 500 MG/5 ML ADULT IV PUSH
1000.0000 mg | Freq: Two times a day (BID) | INTRAVENOUS | Status: DC
  Administered 2023-09-15: 1000 mg via INTRAVENOUS
  Filled 2023-09-14: qty 10

## 2023-09-14 MED ORDER — ORAL CARE MOUTH RINSE
15.0000 mL | OROMUCOSAL | Status: DC
  Administered 2023-09-14 – 2023-09-19 (×54): 15 mL via OROMUCOSAL

## 2023-09-14 MED ORDER — CHLORHEXIDINE GLUCONATE CLOTH 2 % EX PADS
6.0000 | MEDICATED_PAD | Freq: Every day | CUTANEOUS | Status: DC
  Administered 2023-09-14: 6 via TOPICAL

## 2023-09-14 MED ORDER — HYDRALAZINE HCL 20 MG/ML IJ SOLN
10.0000 mg | INTRAMUSCULAR | Status: DC | PRN
  Administered 2023-09-14: 10 mg via INTRAVENOUS

## 2023-09-14 MED ORDER — MIDAZOLAM HCL 2 MG/2ML IJ SOLN
2.0000 mg | INTRAMUSCULAR | Status: DC | PRN
  Administered 2023-09-15 (×2): 2 mg via INTRAVENOUS
  Filled 2023-09-14: qty 2

## 2023-09-14 MED ORDER — LEVETIRACETAM (KEPPRA) 500 MG/5 ML ADULT IV PUSH
60.0000 mg/kg | Freq: Once | INTRAVENOUS | Status: AC
Start: 2023-09-14 — End: 2023-09-14
  Administered 2023-09-14: 2750 mg via INTRAVENOUS
  Filled 2023-09-14: qty 30

## 2023-09-14 MED ORDER — ORAL CARE MOUTH RINSE: 15.0000 mL | OROMUCOSAL | Status: DC | PRN

## 2023-09-14 MED ORDER — PROPOFOL 1000 MG/100ML IV EMUL
0.0000 ug/kg/min | INTRAVENOUS | Status: DC
  Administered 2023-09-14: 10 ug/kg/min via INTRAVENOUS

## 2023-09-14 MED ORDER — MIDAZOLAM HCL 2 MG/2ML IJ SOLN
INTRAMUSCULAR | Status: AC
  Administered 2023-09-14: 2 mg via INTRAVENOUS
  Filled 2023-09-14: qty 2

## 2023-09-14 MED ORDER — HEPARIN SODIUM (PORCINE) 5000 UNIT/ML IJ SOLN
5000.0000 [IU] | Freq: Three times a day (TID) | INTRAMUSCULAR | Status: DC
  Administered 2023-09-14 – 2023-09-19 (×16): 5000 [IU] via SUBCUTANEOUS
  Filled 2023-09-14 (×15): qty 1

## 2023-09-14 NOTE — Progress Notes (Addendum)
 NAME:  Judy Chang, MRN:  [id number], DOB:  [birth year], LOS: 0 ADMISSION DATE:  09/14/2023, CONSULTATION DATE:  09/14/2023 REFERRING MD:  Lonni Seats, MD, CHIEF COMPLAINT:  Cardiac Arrest  History of Present Illness:   73 y/o female who has a PMH for HTN, ESRD on HD, S/p right tunneled dialysis catheter, DMT1, Anemia of chronic disease, Secondary Hyperparathyroidism, CVA with no residual weakness 1 year ago, OA, Chondromalacia, Spinal stenosis, DDD, chronic back pain who presented post PEA arrest.  History form chart and husband at bedside.  Patient apparently in her usual state of health.  She had no complaints per husband prior to going to bed last night.  She woke up in the middle of the night to go to the bathroom and she had labored breathing.  Husband woke up as well to check on her and she fell to the ground and he did not feel a pulse and he called 911.  Husband says he did perform CPR until EMS arrived.  EMS found patient in PEA arrest and did 19 min of CPR, receiving 4 doses of Epinephrine  IV pushes along with Calcium and Bicarb IV pushes.  She was intubated in the field by EMS.  In the ED she was placed on propofol  drip and her SBP in the 190s.  Her labs revealed a potassium of 7 initially and she was given Lokelma .  Repeat K was 4.0.  LA in ED 10.6, CT head and c-spine negative for fractures or acute issues.  Patient with c-collar in ED. She got dialysis yesterday and has not missed her dialysis except for once last week according to husband.  Pertinent  Medical History  HTN, ESRD on HD, S/p right tunneled dialysis catheter, DMT1, Anemia of chronic disease, Secondary Hyperparathyroidism, CVA with no residual weakness 1 year ago, OA, Chondromalacia, Spinal stenosis, DDD, chronic back pain  Significant Hospital Events: Including procedures, antibiotic start and stop dates in addition to other pertinent events   8/21: Admit to ICU  Interim History / Subjective:   Off  Clevelprix. Remain on vent  Objective    Blood pressure (!) 141/73, pulse 70, temperature 99.3 F (37.4 C), resp. rate 20, height 5' (1.524 m), weight 44.5 kg, SpO2 100%.    Vent Mode: PRVC FiO2 (%):  [50 %-100 %] 50 % Set Rate:  [18 bmp-20 bmp] 20 bmp Vt Set:  [360 mL-420 mL] 360 mL PEEP:  [5 cmH20] 5 cmH20 Plateau Pressure:  [19 cmH20-22 cmH20] 22 cmH20   Intake/Output Summary (Last 24 hours) at 09/14/2023 1256 Last data filed at 09/14/2023 1200 Gross per 24 hour  Intake 70.8 ml  Output 600 ml  Net -529.2 ml   Filed Weights   09/14/23 0440 09/14/23 0743  Weight: 49.9 kg 44.5 kg    Examination: Gen:      No acute distress HEENT:  EOMI, sclera anicteric, ET tube Neck:     No masses; no thyromegaly Lungs:    Clear to auscultation bilaterally; normal respiratory effort CV:         Regular rate and rhythm; no murmurs Abd:      + bowel sounds; soft, non-tender; no palpable masses, no distension Ext:    No edema; adequate peripheral perfusion Neuro: Sedated  Labs/imaging reviewed Significant for lactic acid 10.6> 5.5 Potassium 4.0, BUN/creatinine 22/6.97 AST 46, ALT 22 WBC 12.9, hemoglobin 12.7, platelets 155  CT head and neck with no acute abnormality Chest x-ray with mild interstitial prominence  Resolved problem  list   Assessment and Plan  PEA Cardiac arrest May be related to hypokalemia Normothermia protocol Neuro monitoring.  Assess for recovery of neurological function Wean down sedation Follow troponins, echocardiogram  Acute hypoxic respiratory failure Intubated, continue vent support SBT's when mental status allows Follow intermittent chest x-ray  End Stage renal disease Hyperkalemia> improved with Lokelma  Lactic acidosis-resolving Nephrology consult  Hypertensive emergency Weaning off Cleviprex  Hydralazine  as needed Add home medications if blood pressure increases-Norvasc , Coreg , clonidine  but patient was not taking it regularly per  pharmacy.  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  Best Practice (right click and Reselect all SmartList Selections daily)   Diet/type: NPO DVT prophylaxis prophylactic heparin   Pressure ulcer(s): N/A GI prophylaxis: H2B Lines: N/A Foley:  Yes, and it is still needed Code Status:  full code  Critical care time:   The patient is critically ill with multiple organ system failure and requires high complexity decision making for assessment and support, frequent evaluation and titration of therapies, advanced monitoring, review of radiographic studies and interpretation of complex data.   Critical Care Time devoted to patient care services, exclusive of separately billable procedures, described in this note is  minutes.   Marnae Madani MD [facility] See Amion for pager  If no response to pager , please call [phone] until 7pm After 7:00 pm call Elink  [phone] 09/14/2023, 12:56 PM

## 2023-09-14 NOTE — ED Notes (Signed)
 Patient transported to CT

## 2023-09-14 NOTE — Progress Notes (Addendum)
 Decreasing propofol  and with stimulation from removal of IO in Left tibia. Pt having intermittent clonic jerking with eyes open but deviated up and left. Not alert and no purposeful movemnt. Incresed propofol  and jerking subsided. Within 5 min. Will notify MD

## 2023-09-14 NOTE — TOC CM/SW Note (Signed)
 Transition of Care [facility]) - Inpatient Brief Assessment   Patient Details  Name: Judy Chang MRN: [id number] Date of Birth: 25-May-1950  Transition of Care [facility]) CM/SW Contact:    Tom-Johnson, Marquesha Robideau Daphne, RN Phone Number: 09/14/2023, 1:45 PM   Clinical Narrative:  Patient presented to the ED after a witnessed PEA arrest at home. Patient was intubated in the field by EMS. Currently intubated and sedated. Has hx of ESRD, on MWF outpatient Hemodialysis schedule. Nephrology following.   Patient not Medically ready for discharge.  CM will continue to follow as patient progresses with care towards discharge.         Transition of Care Asessment:

## 2023-09-14 NOTE — Consult Note (Signed)
 NEUROLOGY CONSULT NOTE   Date of service: September 14, 2023 Patient Name: Judy Chang MRN:  [id number] DOB:  April 30, 1950 Chief Complaint: s/p arrest Requesting Provider: Mannam, Praveen, MD  History of Present Illness  Judy Chang is a 73 y.o. female with hx of Stroke 1 year ago with no residual weakness, seizures, HTN, HLD, Dementia, Migraines who presented 8/21 to ED s/p cardiac arrest at home. Husband states she woke up in the middle of the night and had labored breathing. He went to check on her and she fell to the ground, he did not feel a pulse and called 911, and performed CPR.   On EMS arrival, she was in PEA arrest. ROSC achieved after 19 minutes, 4 doses of Epi, Calcium and BiCarb pushes. She was intubated by EMS. Propofol  was started in the ED. Potassium 7 initially, improved to 4 with Lokelma . LA 10.6. CTH negative.   Patient is on HD MWF, missed one treatment last week. 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   On exam, patient is intubated and sedated on propofol  at 35. Her eyes are very sluggishly reactive, weak gag, no cough, no response to noxious stimuli. LTM EEG being placed during exam. Per RN, patient has vent dyssynchrony, and episode of rhythmic jerking was seen earlier today with the pause in sedation.    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   pt unaware   Anxiety    Arthritis    Avascular necrosis (HCC)    Chronic left femoral head   Benign essential HTN 10/23/2015   Chondromalacia 02/25/2018   Noted on MRI: Mild   DDD (degenerative disc disease), lumbar 01/10/2011   Noted on MRI   Dementia [facility])    patient denies   Depression    DJD (degenerative joint disease)    GERD (gastroesophageal reflux disease)     History of cataract    Bilateral   Hypercholesterolemia    Hypertension    Labial tear 02/25/2018   Noted on MRI: smal Left anterior    Lumbar spondylosis 01/10/2011   Noted on MRI   Migraines    Renal cyst 11/17/2015   Notedon US  Abd: Simple, Right   Seizures (HCC)    Spinal stenosis 09/06/2016   Noted on MRI: Moderate-Severe   Stroke (HCC)    Vitreous floater, bilateral     Past Surgical History:  Procedure Laterality Date   CAPD INSERTION N/A 10/13/2022   Procedure: LAPAROSCOPIC INSERTION CONTINUOUS AMBULATORY PERITONEAL DIALYSIS  (CAPD) CATHETER;  Surgeon: Magda Debby SAILOR, MD;  [facility];  Service: Vascular;  Laterality: N/A;   CAPD REMOVAL N/A 11/23/2022   Procedure: LAPAROSCOPIC REMOVAL CONTINUOUS AMBULATORY PERITONEAL DIALYSIS  (CAPD) CATHETER;  Surgeon: Magda Debby SAILOR, MD;  Location: [facility] OR;  Service: Vascular;  Laterality: N/A;   COLONOSCOPY     INSERTION OF DIALYSIS CATHETER Right 11/23/2022   Procedure: INSERTION OF TUNNELED DIALYSIS CATHETER;  Surgeon: Magda Debby SAILOR, MD;  Location: [facility];  Service: Vascular;  Laterality: Right;   IR FLUORO GUIDE CV LINE RIGHT  06/07/2023   PARTIAL HYSTERECTOMY     ROTATOR CUFF REPAIR Left    TONSILLECTOMY     TOTAL HIP ARTHROPLASTY Left 06/22/2018  Procedure: TOTAL HIP ARTHROPLASTY ANTERIOR APPROACH;  Surgeon: Yvone Rush, MD;  Location: [facility] ORS;  Service: Orthopedics;  Laterality: Left;   TUNNELLED CATHETER EXCHANGE N/A 05/05/2023   Procedure: TUNNELLED CATHETER EXCHANGE;  Surgeon: Tobie Gordy POUR, MD;  Location: [facility] INVASIVE CV LAB;  Service: Cardiovascular;  Laterality: N/A;   TUNNELLED CATHETER EXCHANGE N/A 08/18/2023   Procedure: TUNNELLED CATHETER EXCHANGE;  Surgeon: Gretta Lonni PARAS, MD;  Location: [facility] PV LAB;  Service: Cardiovascular;  Laterality: N/A;    Family History: Family History  Problem Relation Age of Onset   Hypertension Mother    Hypertension Father     Social History  reports that she has quit  smoking. Her smoking use included cigarettes. She has never used smokeless tobacco. She reports that she does not currently use alcohol. She reports that she does not currently use drugs after having used the following drugs: Marijuana.  Allergies  Allergen Reactions   Citalopram Hydrobromide Hives and Rash   Nsaids Hives and Itching    Burning of the tongue    Pregabalin  Itching    Medications   Current Facility-Administered Medications:    Chlorhexidine  Gluconate Cloth 2 % PADS 6 each, 6 each, Topical, Daily, Maree Harder, MD, 6 each at 09/14/23 0830   [START ON 09/15/2023] Chlorhexidine  Gluconate Cloth 2 % PADS 6 each, 6 each, Topical, Q0600, Geralynn Charleston, MD   clevidipine  (CLEVIPREX ) infusion 0.5 mg/mL, 0-21 mg/hr, Intravenous, Continuous, Maree Harder, MD, Last Rate: 4 mL/hr at 09/14/23 1700, 2 mg/hr at 09/14/23 1700   famotidine  (PEPCID ) tablet 10 mg, 10 mg, Per Tube, Daily, Maree Harder, MD, 10 mg at 09/14/23 1039   heparin  injection 5,000 Units, 5,000 Units, Subcutaneous, Q8H, Maree Harder, MD, 5,000 Units at 09/14/23 1443   hydrALAZINE  (APRESOLINE ) injection 10 mg, 10 mg, Intravenous, Q4H PRN, Maree Harder, MD, 10 mg at 09/14/23 0646   levETIRAcetam  (KEPPRA ) undiluted injection 500 mg, 500 mg, Intravenous, Q12H, Mannam, Praveen, MD, 500 mg at 09/14/23 1712   midazolam  (VERSED ) injection 2 mg, 2 mg, Intravenous, Q1H PRN, Mannam, Praveen, MD, 2 mg at 09/14/23 1715   propofol  (DIPRIVAN ) 1000 MG/100ML infusion, 0-80 mcg/kg/min, Intravenous, Continuous, Pollina, Lonni PARAS, MD, Last Rate: 7.49 mL/hr at 09/14/23 1700, 25 mcg/kg/min at 09/14/23 1700  Vitals   Vitals:   09/14/23 1500 09/14/23 1530 09/14/23 1600 09/14/23 1630  BP: (!) 182/85 (!) 141/76 125/62 134/65  Pulse: 75 66 71 72  Resp: 19 20 20 20   Temp: 99.9 F (37.7 C) 99.7 F (37.6 C) 99.7 F (37.6 C) 99.5 F (37.5 C)  TempSrc:      SpO2: 100% 100% 100% 100%  Weight:      Height:        Body mass index is 19.16  kg/m.   Physical Exam   Constitutional: Appears critically and acutely ill Cardiovascular: Normal rate and regular rhythm. Hypertensive, requiring Cleviprex  Respiratory: Intubated and sedated on full support  Neurologic Examination   Neuro: Mental Status: Intubated and sedated. Does not open eyes to voice or noxious stimuli Does not follow commands Cranial Nerves: II: pupils are sluggishly reactive III,IV, VI: does not track examiner with passive eye opening VII: Face appears symmetric at rest, ETT in place. VIII: does not open eyes to voice IX,X: weak cough and gag reflex XII: tongue appears midline, ETT in place.  Motor: No spontaneous movement seen Sensory: Does not withdraw to noxious stimuli in any extremity Cerebellar: UTA due to mental status   Labs/Imaging/Neurodiagnostic studies  CBC:  Recent Labs  Lab 09/14/23 0401 09/14/23 0442 09/14/23 0553 09/14/23 0725  WBC 13.8*  --   --  12.9*  NEUTROABS 6.2  --   --   --   HGB 11.5*   < > 12.2 12.7  HCT 39.3   < > 36.0 42.0  MCV 98.7  --   --  95.7  PLT 150  --   --  155   < > = values in this interval not displayed.   Basic Metabolic Panel:  Lab Results  Component Value Date   NA 136 09/14/2023   K 3.3 (L) 09/14/2023   CO2 19 (L) 09/14/2023   GLUCOSE 159 (H) 09/14/2023   BUN 22 09/14/2023   CREATININE 5.45 (H) 09/14/2023   CALCIUM 9.0 09/14/2023   GFRNONAA 8 (L) 09/14/2023   GFRAA 46 (L) 02/14/2019   Lipid Panel:  Lab Results  Component Value Date   LDLCALC 93 10/22/2015   HgbA1c:  Lab Results  Component Value Date   HGBA1C 5.2 02/08/2019   Urine Drug Screen:     Component Value Date/Time   LABOPIA NONE DETECTED 02/08/2019 0921   COCAINSCRNUR POSITIVE (A) 02/08/2019 0921   LABBENZ POSITIVE (A) 02/08/2019 0921   AMPHETMU NONE DETECTED 02/08/2019 0921   THCU POSITIVE (A) 02/08/2019 0921   LABBARB NONE DETECTED 02/08/2019 0921    Alcohol Level     Component Value Date/Time   ETH <10  02/07/2019 0836   INR  Lab Results  Component Value Date   INR 1.0 09/17/2022   APTT  Lab Results  Component Value Date   APTT 29 09/17/2022   AED levels:  Lab Results  Component Value Date   PHENYTOIN  17.1 02/13/2019    CT Head without contrast(Personally reviewed): No acute intracranial abnormality related to the minor head trauma. Age-related atrophy and mild-to-moderate periventricular white matter disease.   ASSESSMENT   ANNICK DIMAIO is a 73 y.o. female with hx of Stroke 1 year ago with no residual weakness, seizures, HTN, HLD, Dementia, Migraines who presented 8/21 to ED s/p cardiac arrest at home.   On EMS arrival, she was in PEA arrest. ROSC achieved after 19 minutes, 4 doses of Epi, Calcium and BiCarb pushes. Potassium 7 initially, improved to 4 with Lokelma . LA 10.6. CTH negative.   On exam, patient is intubated and sedated on propofol  at 35. Her eyes are very sluggishly reactive, weak gag, no cough, no response to noxious stimuli. LTM EEG being placed during exam. Per RN, patient has vent dyssynchrony, and episode of rhythmic jerking was seen earlier today with the pause in sedation.   RECOMMENDATIONS   - place on LTM EEG - prefer propofol  for sedation - increase Keppra  to 1,000 mg BID - load with Keppra  60mg /kg - MRI in 2-4 days  ______________________________________________________________________  Signed, Rocky JAYSON Likes, NP [facility]   I have seen the patient reviewed the above note.  She presented with concern for postarrest myoclonic seizures, and has a concerning exam.  She will need LTM EEG and we will continue to follow.  This patient is critically ill and at significant risk of neurological worsening, death and care requires constant monitoring of vital signs, hemodynamics,respiratory and cardiac monitoring, neurological assessment, discussion with family, other specialists and medical decision making of high complexity. I spent 38  minutes of neurocritical care time  in the care of  this patient. This was time spent independent of any time provided by nurse practitioner  or PA.  Aisha Seals, MD [facility]   If 7pm- 7am, please page neurology on call as listed in AMION. 09/25/2023  2:52 PM

## 2023-09-14 NOTE — ED Notes (Signed)
 Lactic results abnormal reported to paige p.rn by at

## 2023-09-14 NOTE — Progress Notes (Signed)
 PHARMACY NOTE:  RENAL DOSAGE ADJUSTMENT  Current medication dosage: famotidine  20 mg bid  Renal Function: Estimated Creatinine Clearance: 6.3 mL/min (A) (by C-G formula based on SCr of 5.45 mg/dL (H)). Patient is ESRD on HD at baseline.    Medication dosage has been changed to: famotidine  10 mg daily  Thank you for allowing pharmacy to be a part of this patient's care.  Maurilio Patten, PharmD PGY1 Pharmacy Resident [facility] 09/14/2023 10:27 AM

## 2023-09-14 NOTE — H&P (Signed)
 NAME:  Judy Chang, MRN:  [id number], DOB:  15-Sep-1950, LOS: 0 ADMISSION DATE:  09/14/2023, CONSULTATION DATE:  09/14/2023 REFERRING MD:  Lonni Seats, MD, CHIEF COMPLAINT:  Cardiac Arrest  History of Present Illness:  73 y/o female who has a PMH for HTN, ESRD on HD, S/p right tunneled dialysis catheter, DMT1, Anemia of chronic disease, Secondary Hyperparathyroidism, CVA with no residual weakness 1 year ago, OA, Chondromalacia, Spinal stenosis, DDD, chronic back pain who presented post PEA arrest.  History form chart and husband at bedside.  Patient apparently in her usual state of health.  She had no complaints per husband prior to going to bed last night.  She woke up in the middle of the night to go to the bathroom and she had labored breathing.  Husband woke up as well to check on her and she fell to the ground and he did not feel a pulse and he called 911.  Husband says he did perform CPR until EMS arrived.  EMS found patient in PEA arrest and did 19 min of CPR, receiving 4 doses of Epinephrine  IV pushes along with Calcium and Bicarb IV pushes.  She was intubated in the field by EMS.  In the ED she was placed on propofol  drip and her SBP in the 190s.  Her labs revealed a potassium of 7 initially and she was given Lokelma .  Repeat K was 4.0.  LA in ED 10.6, CT head and c-spine negative for fractures or acute issues.  Patient with c-collar in ED. She got dialysis yesterday and has not missed her dialysis except for once last week according to husband.  Pertinent  Medical History  HTN, ESRD on HD, S/p right tunneled dialysis catheter, DMT1, Anemia of chronic disease, Secondary Hyperparathyroidism, CVA with no residual weakness 1 year ago, OA, Chondromalacia, Spinal stenosis, DDD, chronic back pain  Significant Hospital Events: Including procedures, antibiotic start and stop dates in addition to other pertinent events   8/21: admit to ICU  Interim History / Subjective:  N/A  Objective     Height 5' (1.524 m), weight 49.9 kg, SpO2 100%.    Vent Mode: PRVC FiO2 (%):  [100 %] 100 % Set Rate:  [18 bmp] 18 bmp Vt Set:  [420 mL] 420 mL PEEP:  [5 cmH20] 5 cmH20 Plateau Pressure:  [19 cmH20] 19 cmH20  No intake or output data in the 24 hours ending 09/14/23 0548 Filed Weights   09/14/23 0440  Weight: 49.9 kg    Examination: General: sedated elderly female looks cachectic on mech vent NAD HENT: PERRLA no icterus, ETT in place Lungs: CTA no wheezing or rales Cardiovascular: Regular S1s2 no murmurs Abdomen: soft nt nd bs sluggish Extremities: no cyanosis, clubbing or edema Neuro: sedated on mech vent GU: Foley  Resolved problem list   Assessment and Plan  PEA Cardiac arrest Maybe related to the high potassium level, now improved post Lokelma  Targeted temp management-Euthermic Acute hypoxic respiratory failure Intubated on mech vent Vent management SAT/SBT when appropriate VAP preventive measures Hyperkalemia Continue to monitor Resoled for now and most recent K 3.3 and may need supplementation Lactic acidosis Should improve with interventions Secondary to cardiac arrest and low perfusion state HTN SBP 190 in ED now Will add IV antihypertensives ESRD on HD Will need Nephrology consult for HD (MWF)  Best Practice (right click and Reselect all SmartList Selections daily)   Diet/type: NPO DVT prophylaxis prophylactic heparin   Pressure ulcer(s): N/A GI prophylaxis: H2B Lines: N/A  Foley:  Yes, and it is still needed Code Status:  full code   Labs   CBC: Recent Labs  Lab 09/14/23 0401 09/14/23 0442  WBC 13.8*  --   NEUTROABS 6.2  --   HGB 11.5* 13.9  HCT 39.3 41.0  MCV 98.7  --   PLT 150  --     Basic Metabolic Panel: Recent Labs  Lab 09/14/23 0442 09/14/23 0450  NA 134* 142  K 7.0* 4.0  CL 103 103  CO2  --  19*  GLUCOSE 171* 159*  BUN 36* 22  CREATININE 7.50* 6.97*  CALCIUM  --  9.0  MG  --  2.3   GFR: Estimated Creatinine  Clearance: 5 mL/min (A) (by C-G formula based on SCr of 6.97 mg/dL (H)). Recent Labs  Lab 09/14/23 0401 09/14/23 0442  WBC 13.8*  --   LATICACIDVEN  --  10.6*    Liver Function Tests: Recent Labs  Lab 09/14/23 0450  AST 46*  ALT 22  ALKPHOS 99  BILITOT 0.7  PROT 6.2*  ALBUMIN  2.8*   No results for input(s): LIPASE, AMYLASE in the last 168 hours. No results for input(s): AMMONIA in the last 168 hours.  ABG    Component Value Date/Time   PHART 7.378 02/07/2019 1553   PCO2ART 39.4 02/07/2019 1553   PO2ART 104.0 02/07/2019 1553   HCO3 23.2 02/07/2019 1553   TCO2 25 09/14/2023 0442   ACIDBASEDEF 2.0 02/07/2019 1553   O2SAT 98.0 02/07/2019 1553     Coagulation Profile: No results for input(s): INR, PROTIME in the last 168 hours.  Cardiac Enzymes: No results for input(s): CKTOTAL, CKMB, CKMBINDEX, TROPONINI in the last 168 hours.  HbA1C: Hgb A1c MFr Bld  Date/Time Value Ref Range Status  02/08/2019 12:47 AM 5.2 4.8 - 5.6 % Final    Comment:    (NOTE) Pre diabetes:          5.7%-6.4% Diabetes:              >6.4% Glycemic control for   <7.0% adults with diabetes     CBG: No results for input(s): GLUCAP in the last 168 hours.  Review of Systems:   Unable to obtain, patient post cardiac arrest, intubated and sedated  Past Medical History:  She,  has a past medical history of Adenoma of right adrenal gland (10/23/2015), Anxiety, Arthritis, Avascular necrosis (HCC), Benign essential HTN (10/23/2015), Chondromalacia (02/25/2018), DDD (degenerative disc disease), lumbar (01/10/2011), Dementia (HCC), Depression, DJD (degenerative joint disease), GERD (gastroesophageal reflux disease), History of cataract, Hypercholesterolemia, Hypertension, Labial tear (02/25/2018), Lumbar spondylosis (01/10/2011), Migraines, Renal cyst (11/17/2015), Seizures (HCC), Spinal stenosis (09/06/2016), Stroke (HCC), and Vitreous floater, bilateral.   Surgical History:    Past Surgical History:  Procedure Laterality Date   CAPD INSERTION N/A 10/13/2022   Procedure: LAPAROSCOPIC INSERTION CONTINUOUS AMBULATORY PERITONEAL DIALYSIS  (CAPD) CATHETER;  Surgeon: Magda Debby SAILOR, MD;  Location[facility];  Service: Vascular;  Laterality: N/A;   CAPD REMOVAL N/A 11/23/2022   Procedure: LAPAROSCOPIC REMOVAL CONTINUOUS AMBULATORY PERITONEAL DIALYSIS  (CAPD) CATHETER;  Surgeon: Magda Debby SAILOR, MD;  Location: [facility];  Service: Vascular;  Laterality: N/A;   COLONOSCOPY     INSERTION OF DIALYSIS CATHETER Right 11/23/2022   Procedure: INSERTION OF TUNNELED DIALYSIS CATHETER;  Surgeon: Magda Debby SAILOR, MD;  Location: [facility];  Service: Vascular;  Laterality: Right;   IR FLUORO GUIDE CV LINE RIGHT  06/07/2023   PARTIAL HYSTERECTOMY     ROTATOR CUFF  REPAIR Left    TONSILLECTOMY     TOTAL HIP ARTHROPLASTY Left 06/22/2018   Procedure: TOTAL HIP ARTHROPLASTY ANTERIOR APPROACH;  Surgeon: Yvone Rush, MD;  Location: [facility];  Service: Orthopedics;  Laterality: Left;   TUNNELLED CATHETER EXCHANGE N/A 05/05/2023   Procedure: TUNNELLED CATHETER EXCHANGE;  Surgeon: Tobie Gordy POUR, MD;  Location: [facility] INVASIVE CV LAB;  Service: Cardiovascular;  Laterality: N/A;   TUNNELLED CATHETER EXCHANGE N/A 08/18/2023   Procedure: TUNNELLED CATHETER EXCHANGE;  Surgeon: Gretta Lonni PARAS, MD;  Location: [facility];  Service: Cardiovascular;  Laterality: N/A;     Social History:   reports that she has quit smoking. Her smoking use included cigarettes. She has never used smokeless tobacco. She reports that she does not currently use alcohol. She reports that she does not currently use drugs after having used the following drugs: Marijuana.   Family History:  Her family history includes Hypertension in her father and mother.   Allergies Allergies  Allergen Reactions   Citalopram Hydrobromide Hives and Rash   Nsaids Hives and Itching    Burning of the tongue    Pregabalin  Itching     Home  Medications  Prior to Admission medications   Medication Sig Start Date End Date Taking? Authorizing Provider  amLODipine  (NORVASC ) 10 MG tablet Take 1 tablet (10 mg total) by mouth daily. 09/18/22   Vann, Jessica U, DO  buPROPion  (WELLBUTRIN  XL) 150 MG 24 hr tablet Take 1 tablet (150 mg total) by mouth every morning. 09/28/22   Vann, Jessica U, DO  carvedilol  (COREG ) 12.5 MG tablet Take 1 tablet (12.5 mg total) by mouth 2 (two) times daily with a meal. 02/17/19   Drusilla Sabas RAMAN, MD  cloNIDine  (CATAPRES  - DOSED IN MG/24 HR) 0.2 mg/24hr patch Place 1 patch (0.2 mg total) onto the skin once a week. 02/23/19   Drusilla Sabas RAMAN, MD  DULoxetine  (CYMBALTA ) 30 MG capsule Take 90 mg by mouth daily. 03/12/21   [provider]  gentamicin cream (GARAMYCIN) 0.1 % Apply 1 Application topically once a week. 11/04/22   [provider]  lactulose (CHRONULAC) 10 GM/15ML solution Take 10 g by mouth daily as needed for mild constipation or moderate constipation. 11/04/22   [provider]  levETIRAcetam  (KEPPRA ) 500 MG tablet Take 1 tablet (500 mg total) by mouth every 12 (twelve) hours. 02/17/19   Drusilla Sabas RAMAN, MD  Oxycodone  HCl 20 MG TABS Take 1.5 tablets (30 mg total) by mouth every 6 (six) hours as needed. 11/23/22   Bethanie Cough, PA-C  sodium zirconium cyclosilicate  (LOKELMA ) 10 g PACK packet Take one 10 g packet and 8 ounces of water  prior to going to dialysis tomorrow morning 12/06/22   Harris, Abigail, PA-C  spironolactone (ALDACTONE) 25 MG tablet Take 25 mg by mouth daily. 09/22/22   [provider]  traZODone  (DESYREL ) 100 MG tablet Take 100 mg by mouth at bedtime. 03/12/21   [provider]     Critical care time: 45   The patient is critically ill with multiple organ system failure and requires high complexity decision making for assessment and support, frequent evaluation and titration of therapies, advanced monitoring, review of radiographic studies and  interpretation of complex data.   Critical Care Time devoted to patient care services, exclusive of separately billable procedures, described in this note is 34 minutes.   Orlin Fairly, MD [facility] See Amion for pager  If no response to pager , please  call [phone] until 7pm After 7:00 pm call Elink  [phone] 09/14/2023, 5:48 AM

## 2023-09-14 NOTE — Progress Notes (Signed)
 RT transported pt on ventilator from ED trauma B to 3M05 without any complications. RN at bedside.

## 2023-09-14 NOTE — Progress Notes (Signed)
 Pt receives out-pt HD at [facility] [place] on TTS 11:30 am chair time. Will assist as needed.   Randine Mungo Dialysis Navigator [phone]

## 2023-09-14 NOTE — ED Notes (Signed)
 Admitting made known of critical Lactic before transferring pt to 3M05.

## 2023-09-14 NOTE — ED Triage Notes (Signed)
 Pt bibgcems from home witnessed arrest walking out of bathroom. EMS gave CPR for 19 minutes gave 4 epis, calcium, sodium bicarb. Pt intubated with ems.  Bp 233/130 hr 120  Cbg 130

## 2023-09-14 NOTE — Progress Notes (Signed)
 Pt transported on ventilator to CT and back with no complications.

## 2023-09-14 NOTE — ED Notes (Addendum)
 Called floor for report with no answer x1

## 2023-09-14 NOTE — Progress Notes (Signed)
 4 gold rings removed from patients fingers 2 gold bracelets from patients wrist 6 stud ear rings  2 gold necklaces with pedant  removed from patient.  All jewelry placed in containers and labeled and given to patients husband Judy Chang. Confirmed phone number [phone]

## 2023-09-14 NOTE — Consult Note (Signed)
 Renal Service Consult Note [facility] Lamar JONETTA Fret, MD  Patient: Judy Chang Date: 09/14/2023 Requesting Physician: Dr. Theophilus  Reason for Consult: ESRD pt s/o cardiac arrest  HPI: The patient is a 73 y.o. year-old w/ PMH as below who presented to ED from home after witnessed cardiac. EMS gave CPR fo 19 minutes. Pt intubated. Pt is HD on MWF schedule, last HD was yesterday. In ED 1st K+ was 7, then she was given lokelma  and repeat was 4.0. LA in ED 10.6, CT head negative. Per husband rarely misses dialysis. BUN 22, creat 6.97, alb 2.8, Hb 12.7. Pt was admitted to ICU. We are asked to see for dialysis.    Pt seen in ICU. Pt is thin and cachectic, on the vent, not able to get any history.    ROS - n/a  Past Medical History  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   pt unaware   Anxiety    Arthritis    Avascular necrosis (HCC)    Chronic left femoral head   Benign essential HTN 10/23/2015   Chondromalacia 02/25/2018   Noted on MRI: Mild   DDD (degenerative disc disease), lumbar 01/10/2011   Noted on MRI   Dementia (HCC)    patient denies   Depression    DJD (degenerative joint disease)    GERD (gastroesophageal reflux disease)    History of cataract    Bilateral   Hypercholesterolemia    Hypertension    Labial tear 02/25/2018   Noted on MRI: smal Left anterior    Lumbar spondylosis 01/10/2011   Noted on MRI   Migraines    Renal cyst 11/17/2015   Notedon US  Abd: Simple, Right   Seizures (HCC)    Spinal stenosis 09/06/2016   Noted on MRI: Moderate-Severe   Stroke (HCC)    Vitreous floater, bilateral    Past Surgical History  Past Surgical History:  Procedure Laterality Date   CAPD INSERTION N/A 10/13/2022   Procedure: LAPAROSCOPIC INSERTION CONTINUOUS AMBULATORY PERITONEAL DIALYSIS  (CAPD) CATHETER;  Surgeon: Magda Debby SAILOR, MD;  Location: [facility] OR;  Service: Vascular;  Laterality: N/A;   CAPD  REMOVAL N/A 11/23/2022   Procedure: LAPAROSCOPIC REMOVAL CONTINUOUS AMBULATORY PERITONEAL DIALYSIS  (CAPD) CATHETER;  Surgeon: Magda Debby SAILOR, MD;  Location: [facility] OR;  Service: Vascular;  Laterality: N/A;   COLONOSCOPY     INSERTION OF DIALYSIS CATHETER Right 11/23/2022   Procedure: INSERTION OF TUNNELED DIALYSIS CATHETER;  Surgeon: Magda Debby SAILOR, MD;  Location: [facility] OR;  Service: Vascular;  Laterality: Right;   IR FLUORO GUIDE CV LINE RIGHT  06/07/2023   PARTIAL HYSTERECTOMY     ROTATOR CUFF REPAIR Left    TONSILLECTOMY     TOTAL HIP ARTHROPLASTY Left 06/22/2018   Procedure: TOTAL HIP ARTHROPLASTY ANTERIOR APPROACH;  Surgeon: Yvone Rush, MD;  Location: [facility] ORS;  Service: Orthopedics;  Laterality: Left;   TUNNELLED CATHETER EXCHANGE N/A 05/05/2023   Procedure: TUNNELLED CATHETER EXCHANGE;  Surgeon: Tobie Gordy POUR, MD;  Location: [facility] INVASIVE CV LAB;  Service: Cardiovascular;  Laterality: N/A;   TUNNELLED CATHETER EXCHANGE N/A 08/18/2023   Procedure: TUNNELLED CATHETER EXCHANGE;  Surgeon: Gretta Lonni PARAS, MD;  Location: [facility] PV LAB;  Service: Cardiovascular;  Laterality: N/A;   Family History  Family History  Problem Relation Age of Onset   Hypertension Mother    Hypertension Father    Social History  reports that she has quit smoking.  Her smoking use included cigarettes. She has never used smokeless tobacco. She reports that she does not currently use alcohol. She reports that she does not currently use drugs after having used the following drugs: Marijuana. Allergies  Allergies  Allergen Reactions   Citalopram Hydrobromide Hives and Rash   Nsaids Hives and Itching    Burning of the tongue    Pregabalin  Itching   Home medications Prior to Admission medications   Medication Sig Start Date End Date Taking? Authorizing Provider  buPROPion  (WELLBUTRIN  XL) 150 MG 24 hr tablet Take 1 tablet (150 mg total) by mouth every morning. 09/28/22  Yes Vann, Jessica U, DO  carvedilol  (COREG ) 12.5 MG  tablet Take 1 tablet (12.5 mg total) by mouth 2 (two) times daily with a meal. 02/17/19  Yes Drusilla, Sabas RAMAN, MD  DULoxetine  (CYMBALTA ) 30 MG capsule Take 90 mg by mouth daily. 03/12/21  Yes [provider]  mirtazapine (REMERON) 7.5 MG tablet Take 7.5 mg by mouth at bedtime.   Yes [provider]  Oxycodone  HCl 20 MG TABS Take 1.5 tablets (30 mg total) by mouth every 6 (six) hours as needed. 11/23/22  Yes Bethanie Cough, PA-C  sevelamer carbonate (RENVELA) 800 MG tablet Take 1,600 mg by mouth 3 (three) times daily. 08/24/23  Yes [provider]  tiZANidine  (ZANAFLEX ) 2 MG tablet Take 2 mg by mouth 2 (two) times daily as needed for muscle spasms. 08/04/23  Yes [provider]  amLODipine  (NORVASC ) 5 MG tablet Take 5 mg by mouth daily. Patient not taking: Reported on 09/14/2023 08/15/23   [provider]  cloNIDine  (CATAPRES  - DOSED IN MG/24 HR) 0.2 mg/24hr patch Place 1 patch (0.2 mg total) onto the skin once a week. Patient not taking: Reported on 09/14/2023 02/23/19   Drusilla Sabas RAMAN, MD  lactulose (CHRONULAC) 10 GM/15ML solution Take 10 g by mouth daily as needed for mild constipation or moderate constipation. Patient not taking: Reported on 09/14/2023 11/04/22   [provider]  levETIRAcetam  (KEPPRA ) 500 MG tablet Take 1 tablet (500 mg total) by mouth every 12 (twelve) hours. Patient not taking: Reported on 09/14/2023 02/17/19   Drusilla Sabas RAMAN, MD  spironolactone (ALDACTONE) 25 MG tablet Take 25 mg by mouth daily. Patient not taking: Reported on 09/14/2023 09/22/22   [provider]  traZODone  (DESYREL ) 100 MG tablet Take 100 mg by mouth at bedtime. Patient not taking: Reported on 09/14/2023 03/12/21   [provider]     Vitals:   09/14/23 1330 09/14/23 1348 09/14/23 1400 09/14/23 1430  BP: 135/71  (!) 164/90 (!) 156/75  Pulse: 71 70 74 64  Resp: 13 20 18 18   Temp: 100 F (37.8 C) 100.2 F (37.9 C) 100 F (37.8 C) 100 F (37.8  C)  TempSrc:      SpO2: 100% 100% 100% 100%  Weight:      Height:       Exam Gen on vent, sedated, slight jerking diffusely No rash, cyanosis or gangrene Sclera anicteric, throat w/ ETT No jvd, neck veins are flat Chest clear anterior/ lateral RRR no MRG Abd soft ntnd no mass or ascites +bs GU defer MS no joint effusions or deformity Ext no LE edema, no wounds or ulcers Neuro is on vent, sedated    RIJ TDC  Home bp meds: Norvasc  5 every day (not taking) Coreg  12.5 bid Clonidine  0.2mg  patch weekly Aldactone 25 mg every day Others: trazodone , zanaflex , renvela 2 ac, oxy IR prn,  Keppra , remeron, cymbalta , lactulose, wellbutrin  xl   OP HD: [facility] 3.5h   B350  41kg  2K bath  TDC   Heparin  2000 Last HD 8/19, post wt 43.2kg  Usually gets to dry wt     Assessment/ Plan: S/P cardiac arrest: this am 8/21 at home, s/p 19 min of CPR til ROSC. Per CCM.  ESRD: on HD TTS. Last HD Tuesday. No acute needs. Plan next HD tonight or tomorrow.  HTN: taking 2-3 bp meds at home. Getting IV cleviprex  infusion here.  Volume: 2-3 kg by wts, UF goal the same Anemia of esrd: Hb 12 here, follow.        Myer Fret  MD [facility] 09/14/2023, 4:03 PM  Recent Labs  Lab 09/14/23 0450 09/14/23 0553 09/14/23 0725  HGB  --  12.2 12.7  ALBUMIN  2.8*  --   --   CALCIUM 9.0  --   --   CREATININE 6.97*  --  5.45*  K 4.0 3.3*  --    Inpatient medications:  Chlorhexidine  Gluconate Cloth  6 each Topical Daily   famotidine   10 mg Per Tube Daily   heparin   5,000 Units Subcutaneous Q8H   levETIRAcetam   500 mg Intravenous Q12H    clevidipine  Stopped (09/14/23 1034)   propofol  (DIPRIVAN ) infusion 25 mcg/kg/min (09/14/23 1324)   hydrALAZINE 

## 2023-09-14 NOTE — ED Provider Notes (Signed)
 [place] EMERGENCY DEPARTMENT AT [facility] Provider Note   CSN: [id number] Arrival date & time: 09/14/23  9642     Patient presents with: Post Arrest    Judy Chang is a 73 y.o. female.   Brought to the emergency department by ambulance from home after cardiac arrest.  Patient reportedly collapsed after going to the bathroom.  Husband witnessed the arrest, started CPR.  EMS report that patient was in PEA at a slow rate upon their arrival.  EPR was continued for a total of 19 minutes.  Patient received 4 doses of epi during that time.  Patient is a dialysis patient, last dialyzed yesterday.  She was given calcium and bicarb, ROSC was achieved.  Patient was intubated by EMS with a 7.0 endotracheal tube.       Prior to Admission medications   Medication Sig Start Date End Date Taking? Authorizing Provider  amLODipine  (NORVASC ) 10 MG tablet Take 1 tablet (10 mg total) by mouth daily. 09/18/22   Vann, Jessica U, DO  buPROPion  (WELLBUTRIN  XL) 150 MG 24 hr tablet Take 1 tablet (150 mg total) by mouth every morning. 09/28/22   Vann, Jessica U, DO  carvedilol  (COREG ) 12.5 MG tablet Take 1 tablet (12.5 mg total) by mouth 2 (two) times daily with a meal. 02/17/19   Drusilla Sabas RAMAN, MD  cloNIDine  (CATAPRES  - DOSED IN MG/24 HR) 0.2 mg/24hr patch Place 1 patch (0.2 mg total) onto the skin once a week. 02/23/19   Drusilla Sabas RAMAN, MD  DULoxetine  (CYMBALTA ) 30 MG capsule Take 90 mg by mouth daily. 03/12/21   [provider]  gentamicin cream (GARAMYCIN) 0.1 % Apply 1 Application topically once a week. 11/04/22   [provider]  lactulose (CHRONULAC) 10 GM/15ML solution Take 10 g by mouth daily as needed for mild constipation or moderate constipation. 11/04/22   [provider]  levETIRAcetam  (KEPPRA ) 500 MG tablet Take 1 tablet (500 mg total) by mouth every 12 (twelve) hours. 02/17/19   Drusilla Sabas RAMAN, MD  Oxycodone  HCl 20 MG TABS Take 1.5 tablets (30 mg total) by  mouth every 6 (six) hours as needed. 11/23/22   Bethanie Cough, PA-C  sodium zirconium cyclosilicate  (LOKELMA ) 10 g PACK packet Take one 10 g packet and 8 ounces of water  prior to going to dialysis tomorrow morning 12/06/22   Harris, Abigail, PA-C  spironolactone (ALDACTONE) 25 MG tablet Take 25 mg by mouth daily. 09/22/22   [provider]  traZODone  (DESYREL ) 100 MG tablet Take 100 mg by mouth at bedtime. 03/12/21   [provider]    Allergies: Citalopram hydrobromide, Nsaids, and Pregabalin     Review of Systems  Updated Vital Signs Ht 5' (1.524 m)   Wt 49.9 kg   SpO2 100%   BMI 21.48 kg/m   Physical Exam Vitals and nursing note reviewed.  Constitutional:      Appearance: She is well-developed.  HENT:     Head: Normocephalic and atraumatic.     Mouth/Throat:     Mouth: Mucous membranes are moist.  Eyes:     General: Vision grossly intact. Gaze aligned appropriately.     Extraocular Movements: Extraocular movements intact.     Conjunctiva/sclera: Conjunctivae normal.  Cardiovascular:     Rate and Rhythm: Normal rate and regular rhythm.     Pulses: Normal pulses.     Heart sounds: Normal heart sounds, S1 normal and S2 normal. No murmur heard.    No friction  rub. No gallop.  Pulmonary:     Effort: No respiratory distress.     Comments: Coarse breath sounds bilaterally, bagged via endotracheal tube Abdominal:     Palpations: Abdomen is soft.  Musculoskeletal:        General: No swelling.     Cervical back: Full passive range of motion without pain, normal range of motion and neck supple. No spinous process tenderness or muscular tenderness. Normal range of motion.     Right lower leg: No edema.     Left lower leg: No edema.  Skin:    General: Skin is warm and dry.     Capillary Refill: Capillary refill takes less than 2 seconds.     Findings: No ecchymosis, erythema, rash or wound.  Neurological:     Coordination: Coordination is intact.      Comments: Attempting to initiate respirations on her own, no other response     (all labs ordered are listed, but only abnormal results are displayed) Labs Reviewed  CBC WITH DIFFERENTIAL/PLATELET - Abnormal; Notable for the following components:      Result Value   WBC 13.8 (*)    Hemoglobin 11.5 (*)    MCHC 29.3 (*)    RDW 17.1 (*)    Lymphs Abs 6.6 (*)    Eosinophils Absolute 0.6 (*)    All other components within normal limits  I-STAT CG4 LACTIC ACID, ED - Abnormal; Notable for the following components:   Lactic Acid, Venous 10.6 (*)    All other components within normal limits  I-STAT CHEM 8, ED - Abnormal; Notable for the following components:   Sodium 134 (*)    Potassium 7.0 (*)    BUN 36 (*)    Creatinine, Ser 7.50 (*)    Glucose, Bld 171 (*)    All other components within normal limits  TROPONIN I (HIGH SENSITIVITY) - Abnormal; Notable for the following components:   Troponin I (High Sensitivity) 78 (*)    All other components within normal limits  COMPREHENSIVE METABOLIC PANEL WITH GFR  MAGNESIUM     EKG: EKG Interpretation Date/Time:  Thursday September 14 2023 04:06:11 EDT Ventricular Rate:  108 PR Interval:  170 QRS Duration:  96 QT Interval:  359 QTC Calculation: 482 R Axis:   10  Text Interpretation: Sinus tachycardia Anteroseptal infarct, old Nonspecific repol abnormality, diffuse leads Confirmed by Haze Lonni PARAS [phone]) on 09/14/2023 4:13:15 AM  Radiology: [facility] Chest Port 1 View Result Date: 09/14/2023 EXAM: 1 VIEW XRAY OF THE CHEST 09/14/2023 04:16:04 AM COMPARISON: AP radiograph of the chest dated 12/06/2002. CLINICAL HISTORY: Intubation, post CPR. FINDINGS: LUNGS AND PLEURA: Mildly prominent interstitial opacities within the lungs bilaterally. No focal pulmonary opacity. No pulmonary edema. No pleural effusion. No pneumothorax. HEART AND MEDIASTINUM: No acute abnormality of the cardiac and mediastinal silhouettes. BONES AND SOFT TISSUES: No acute  osseous abnormality. LINES AND TUBES: Endotracheal tube is present with its tip about 5 cm proximal to the carina. A right internal jugular hemodialysis catheter is also present with its tip near the superioratrial caval junction. IMPRESSION: 1. Mildly prominent interstitial opacities within the lungs bilaterally. 2. Endotracheal tube and right internal jugular hemodialysis catheter in appropriate position. Electronically signed by: Evalene Coho MD 09/14/2023 05:02 AM EDT RP Workstation: [id number]   CT CERVICAL SPINE WO CONTRAST Result Date: 09/14/2023 EXAM: CT CERVICAL SPINE WITHOUT CONTRAST 09/14/2023 04:31:34 AM TECHNIQUE: CT of the cervical spine was performed without the administration of intravenous contrast. Multiplanar reformatted images  are provided for review. Automated exposure control, iterative reconstruction, and/or weight based adjustment of the mA/kV was utilized to reduce the radiation dose to as low as reasonably achievable. COMPARISON: CT of the cervical spine dated 09/16/2022. CLINICAL HISTORY: Neck trauma (Age >= 65y). Pt bibgcems from home witnessed arrest walking out of bathroom. EMS gave CPR for 19 minutes gave 4 epis, calcium, sodium bicarb. Pt intubated with ems. FINDINGS: CERVICAL SPINE: BONES AND ALIGNMENT: No acute fracture or traumatic malalignment. DEGENERATIVE CHANGES: Mild chronic degenerative disc disease at C3-4 with minimal uncovertebral joint hypertrophy. SOFT TISSUES: No prevertebral soft tissue swelling. An endotracheal tube and gastric tube are present. There is fluid within the thoracic esophagus. IMPRESSION: 1. No acute abnormality of the cervical spine related to the reported neck trauma. Electronically signed by: Evalene Coho MD 09/14/2023 05:00 AM EDT RP Workstation: [id number]   CT HEAD WO CONTRAST ( ) Result Date: 09/14/2023 EXAM: CT HEAD WITHOUT CONTRAST 09/14/2023 04:31:34 AM TECHNIQUE: CT of the head was performed without the administration of  intravenous contrast. Automated exposure control, iterative reconstruction, and/or weight based adjustment of the mA/kV was utilized to reduce the radiation dose to as low as reasonably achievable. COMPARISON: CT of the head dated 09/16/2022. CLINICAL HISTORY: Head trauma, minor (Age >= 65y). Pt bibgcems from home witnessed arrest walking out of bathroom. EMS gave CPR for 19 minutes gave 4 epis, calcium, sodium bicarb. Pt intubated with ems. FINDINGS: BRAIN AND VENTRICLES: No acute hemorrhage. Gray-white differentiation is preserved. No hydrocephalus. No extra-axial collection. No mass effect or midline shift. Various age-related atrophy and mild-to-moderate periventricular white matter disease. ORBITS: No acute abnormality. SINUSES: Moderate mucosal disease within the paranasal sinuses. SOFT TISSUES AND SKULL: No acute soft tissue abnormality. No skull fracture. The patient is status post bilateral injury placement. VASCULATURE: Calcifications within the carotid siphons. IMPRESSION: 1. No acute intracranial abnormality related to the minor head trauma. 2. Age-related atrophy and mild-to-moderate periventricular white matter disease. Electronically signed by: Evalene Coho MD 09/14/2023 04:58 AM EDT RP Workstation: [id number]     Procedures   Medications Ordered in the ED  propofol  (DIPRIVAN ) 1000 MG/100ML infusion (30 mcg/kg/min Intravenous Rate/Dose Change 09/14/23 0416)  sodium zirconium cyclosilicate  (LOKELMA ) packet 10 g (has no administration in time range)                                    Medical Decision Making Amount and/or Complexity of Data Reviewed Labs: ordered. Decision-making details documented in ED Course. Radiology: ordered and independent interpretation performed. Decision-making details documented in ED Course. ECG/medicine tests: ordered and independent interpretation performed. Decision-making details documented in ED Course.  Risk Prescription drug  management.   Differential diagnosis considered includes, but not limited to:  MI; arrhythmia including electrolyte induced; PE; stroke; intracranial hemorrhage  Patient presents to the emergency department after cardiac arrest at home.  Husband witnessed her walking back from the bathroom when she collapsed.  CPR was started immediately.  Patient was in PEA at a rate of 40.  EMS continued CPR, performed ACLS interventions including treatment for possible hyperkalemia.  She seemed to respond after calcium and bicarb.  Patient is exhibiting narrow QRS complex complex arrival.  EKG without significant T wave peaking but she did have hyperkalemia on her chemistry.  This may have been the cause of her arrest.  Will administer Lokelma  via NG tube.  CT head and cervical spine without acute abnormality.  Chest x-ray without acute findings, endotracheal tube in appropriate placement.  Patient to be admitted to ICU.  CRITICAL CARE Performed by: Lonni JINNY Seats   Total critical care time: 35 minutes  Critical care time was exclusive of separately billable procedures and treating other patients.  Critical care was necessary to treat or prevent imminent or life-threatening deterioration.  Critical care was time spent personally by me on the following activities: development of treatment plan with patient and/or surrogate as well as nursing, discussions with consultants, evaluation of patient's response to treatment, examination of patient, obtaining history from patient or surrogate, ordering and performing treatments and interventions, ordering and review of laboratory studies, ordering and review of radiographic studies, pulse oximetry and re-evaluation of patient's condition.      Final diagnoses:  Cardiac arrest [facility])    ED Discharge Orders     None          Seats Lonni JINNY, MD 09/14/23 [phone]

## 2023-09-14 NOTE — Progress Notes (Signed)
 LTM VIDEO EEG hooked up and running - no initial skin breakdown - push button tested - [facility] is monitoring.

## 2023-09-15 ENCOUNTER — Inpatient Hospital Stay (HOSPITAL_COMMUNITY)

## 2023-09-15 DIAGNOSIS — I469 Cardiac arrest, cause unspecified: Secondary | ICD-10-CM | POA: Diagnosis not present

## 2023-09-15 DIAGNOSIS — G4089 Other seizures: Secondary | ICD-10-CM

## 2023-09-15 DIAGNOSIS — I161 Hypertensive emergency: Secondary | ICD-10-CM

## 2023-09-15 DIAGNOSIS — N179 Acute kidney failure, unspecified: Secondary | ICD-10-CM | POA: Diagnosis not present

## 2023-09-15 DIAGNOSIS — G253 Myoclonus: Secondary | ICD-10-CM | POA: Diagnosis not present

## 2023-09-15 DIAGNOSIS — R569 Unspecified convulsions: Secondary | ICD-10-CM | POA: Diagnosis not present

## 2023-09-15 LAB — GLUCOSE, CAPILLARY
Glucose-Capillary: 71 mg/dL (ref 70–99)
Glucose-Capillary: 73 mg/dL (ref 70–99)
Glucose-Capillary: 72 mg/dL (ref 70–99)
Glucose-Capillary: 80 mg/dL (ref 70–99)
Glucose-Capillary: 89 mg/dL (ref 70–99)
Glucose-Capillary: 119 mg/dL — ABNORMAL HIGH (ref 70–99)
Glucose-Capillary: 85 mg/dL (ref 70–99)

## 2023-09-15 LAB — LACTIC ACID, PLASMA: Lactic Acid, Venous: 1 mmol/L (ref 0.5–1.9)

## 2023-09-15 LAB — LIPID PANEL
Cholesterol: 157 mg/dL (ref 0–200)
HDL: 89 mg/dL (ref >40)
LDL Cholesterol: 49 mg/dL (ref 0–99)
Total CHOL/HDL Ratio: 1.8 ratio
Triglycerides: 96 mg/dL (ref <150)
VLDL: 19 mg/dL (ref 0–40)

## 2023-09-15 LAB — BASIC METABOLIC PANEL WITH GFR
Anion gap: 11 (ref 5–15)
BUN: 37 mg/dL — ABNORMAL HIGH (ref 8–23)
CO2: 26 mmol/L (ref 22–32)
Calcium: 8.5 mg/dL — ABNORMAL LOW (ref 8.9–10.3)
Chloride: 101 mmol/L (ref 98–111)
Creatinine, Ser: 7.63 mg/dL — ABNORMAL HIGH (ref 0.44–1.00)
GFR, Estimated: 5 mL/min — ABNORMAL LOW (ref >60)
Glucose, Bld: 99 mg/dL (ref 70–99)
Potassium: 3.1 mmol/L — ABNORMAL LOW (ref 3.5–5.1)
Sodium: 138 mmol/L (ref 135–145)

## 2023-09-15 LAB — TROPONIN I (HIGH SENSITIVITY): Troponin I (High Sensitivity): 346 ng/L (ref <18)

## 2023-09-15 LAB — CBC
HCT: 33.6 % — ABNORMAL LOW (ref 36.0–46.0)
Hemoglobin: 10.9 g/dL — ABNORMAL LOW (ref 12.0–15.0)
MCH: 29.1 pg (ref 26.0–34.0)
MCHC: 32.4 g/dL (ref 30.0–36.0)
MCV: 89.8 fL (ref 80.0–100.0)
Platelets: 117 K/uL — ABNORMAL LOW (ref 150–400)
RBC: 3.74 MIL/uL — ABNORMAL LOW (ref 3.87–5.11)
RDW: 17 % — ABNORMAL HIGH (ref 11.5–15.5)
WBC: 9.6 K/uL (ref 4.0–10.5)
nRBC: 0 % (ref 0.0–0.2)

## 2023-09-15 LAB — MAGNESIUM: Magnesium: 2.3 mg/dL (ref 1.7–2.4)

## 2023-09-15 LAB — PHOSPHORUS
Phosphorus: 6 mg/dL — ABNORMAL HIGH (ref 2.5–4.6)
Phosphorus: 4.7 mg/dL — ABNORMAL HIGH (ref 2.5–4.6)

## 2023-09-15 LAB — MRSA NEXT GEN BY PCR, NASAL: MRSA by PCR Next Gen: NOT DETECTED

## 2023-09-15 MED ORDER — NOREPINEPHRINE 4 MG/250ML-% IV SOLN
0.0000 ug/min | INTRAVENOUS | Status: DC
  Administered 2023-09-15: 6 ug/min via INTRAVENOUS
  Administered 2023-09-15: 2 ug/min via INTRAVENOUS
  Administered 2023-09-16 – 2023-09-18 (×7): 10 ug/min via INTRAVENOUS
  Administered 2023-09-19 (×2): 20 ug/min via INTRAVENOUS
  Administered 2023-09-19: 10 ug/min via INTRAVENOUS
  Filled 2023-09-15 (×11): qty 250

## 2023-09-15 MED ORDER — SODIUM CHLORIDE 0.9 % IV SOLN
200.0000 mg | Freq: Two times a day (BID) | INTRAVENOUS | Status: DC
  Administered 2023-09-15 – 2023-09-19 (×8): 200 mg via INTRAVENOUS
  Filled 2023-09-15 (×9): qty 20

## 2023-09-15 MED ORDER — HEPARIN SODIUM (PORCINE) 1000 UNIT/ML DIALYSIS
1000.0000 [IU] | INTRAMUSCULAR | Status: AC | PRN
  Administered 2023-09-17: 2000 [IU] via INTRAVENOUS_CENTRAL
  Filled 2023-09-15 (×2): qty 1

## 2023-09-15 MED ORDER — DIAZEPAM 5 MG/ML IJ SOLN
20.0000 mg | Freq: Once | INTRAMUSCULAR | Status: AC
  Administered 2023-09-15: 20 mg via INTRAVENOUS
  Filled 2023-09-15: qty 4

## 2023-09-15 MED ORDER — VALPROATE SODIUM 100 MG/ML IV SOLN
1840.0000 mg | Freq: Once | INTRAVENOUS | Status: AC
  Administered 2023-09-15: 1840 mg via INTRAVENOUS
  Filled 2023-09-15: qty 18.4

## 2023-09-15 MED ORDER — PROSOURCE TF20 ENFIT COMPATIBL EN LIQD
60.0000 mL | Freq: Every day | ENTERAL | Status: DC
  Administered 2023-09-15 – 2023-09-18 (×4): 60 mL
  Filled 2023-09-15 (×4): qty 60

## 2023-09-15 MED ORDER — MIDAZOLAM-SODIUM CHLORIDE 100-0.9 MG/100ML-% IV SOLN
5.0000 mg/h | INTRAVENOUS | Status: DC
  Administered 2023-09-15 – 2023-09-16 (×4): 10 mg/h via INTRAVENOUS
  Administered 2023-09-17: 5 mg/h via INTRAVENOUS
  Administered 2023-09-17: 10 mg/h via INTRAVENOUS
  Administered 2023-09-18: 5 mg/h via INTRAVENOUS
  Filled 2023-09-15 (×7): qty 100

## 2023-09-15 MED ORDER — SODIUM CHLORIDE 0.9 % IV SOLN: 250.0000 mL | INTRAVENOUS | Status: AC

## 2023-09-15 MED ORDER — LACTATED RINGERS IV BOLUS
500.0000 mL | Freq: Once | INTRAVENOUS | Status: AC
  Administered 2023-09-15: 500 mL via INTRAVENOUS

## 2023-09-15 MED ORDER — POTASSIUM CHLORIDE 10 MEQ/100ML IV SOLN
10.0000 meq | INTRAVENOUS | Status: AC
  Administered 2023-09-15 (×4): 10 meq via INTRAVENOUS
  Filled 2023-09-15 (×4): qty 100

## 2023-09-15 MED ORDER — CHLORHEXIDINE GLUCONATE CLOTH 2 % EX PADS: 6.0000 | MEDICATED_PAD | Freq: Every day | CUTANEOUS | Status: DC

## 2023-09-15 MED ORDER — DIAZEPAM 5 MG/ML IJ SOLN
10.0000 mg | INTRAMUSCULAR | Status: AC
  Administered 2023-09-15: 10 mg via INTRAVENOUS
  Filled 2023-09-15: qty 2

## 2023-09-15 MED ORDER — VALPROATE SODIUM 100 MG/ML IV SOLN
500.0000 mg | Freq: Three times a day (TID) | INTRAVENOUS | Status: DC
  Administered 2023-09-15 – 2023-09-19 (×12): 500 mg via INTRAVENOUS
  Filled 2023-09-15: qty 5
  Filled 2023-09-15: qty 500
  Filled 2023-09-15: qty 5
  Filled 2023-09-15: qty 500
  Filled 2023-09-15 (×3): qty 5
  Filled 2023-09-15: qty 500
  Filled 2023-09-15 (×3): qty 5
  Filled 2023-09-15: qty 500
  Filled 2023-09-15 (×4): qty 5

## 2023-09-15 MED ORDER — OSMOLITE 1.2 CAL PO LIQD
1000.0000 mL | ORAL | Status: DC
  Administered 2023-09-15 – 2023-09-18 (×6): 1000 mL
  Filled 2023-09-15 (×5): qty 1000

## 2023-09-15 MED ORDER — SODIUM CHLORIDE 0.9 % IV SOLN
400.0000 mg | Freq: Once | INTRAVENOUS | Status: AC
  Administered 2023-09-16: 400 mg via INTRAVENOUS
  Filled 2023-09-15: qty 40

## 2023-09-15 MED ORDER — HEPARIN SODIUM (PORCINE) 1000 UNIT/ML DIALYSIS
2000.0000 [IU] | Freq: Once | INTRAMUSCULAR | Status: AC
  Administered 2023-09-15: 2000 [IU] via INTRAVENOUS_CENTRAL
  Filled 2023-09-15 (×2): qty 2

## 2023-09-15 MED ORDER — HEPARIN SODIUM (PORCINE) 1000 UNIT/ML DIALYSIS
1000.0000 [IU] | INTRAMUSCULAR | Status: DC | PRN
  Administered 2023-09-15 – 2023-09-17 (×2): 3200 [IU]
  Filled 2023-09-15 (×6): qty 1

## 2023-09-15 MED ORDER — ATROPINE SULFATE 1 MG/10ML IJ SOSY
PREFILLED_SYRINGE | INTRAMUSCULAR | Status: AC
Start: 2023-09-15 — End: 2023-09-16
  Filled 2023-09-15: qty 10

## 2023-09-15 MED ORDER — LEVETIRACETAM (KEPPRA) 500 MG/5 ML ADULT IV PUSH
500.0000 mg | Freq: Two times a day (BID) | INTRAVENOUS | Status: DC
  Administered 2023-09-15 – 2023-09-19 (×8): 500 mg via INTRAVENOUS
  Filled 2023-09-15 (×8): qty 5

## 2023-09-15 MED ORDER — MIDAZOLAM BOLUS VIA INFUSION
2.0000 mg | INTRAVENOUS | Status: DC | PRN
  Administered 2023-09-16 (×2): 2 mg via INTRAVENOUS

## 2023-09-15 NOTE — Progress Notes (Signed)
 PHARMACY PROGRESS NOTE   Discussed with Dr. Geralynn potassium level of 3.1; agreed to add 40 mEq IV KCl for repletion.   Maurilio Patten, PharmD PGY1 Pharmacy Resident [facility] 09/15/2023 10:39 AM

## 2023-09-15 NOTE — Progress Notes (Signed)
 Initial Nutrition Assessment  DOCUMENTATION CODES:  Not applicable  INTERVENTION:  Initiate tube feeding via OGT: Osmolite 1.2 at 40 ml/h (960 ml per day) *start at 20ml and advance by 10ml q8h to goal rate as tolerated Prosource TF20 60 ml once daily Provides 1232 kcal, 73 gm protein, 787 ml free water  daily  NUTRITION DIAGNOSIS:  Inadequate oral intake related to acute illness as evidenced by NPO status.  GOAL:  Patient will meet greater than or equal to 90% of their needs  MONITOR:  Vent status, Labs, TF tolerance, Weight trends, I & O's  REASON FOR ASSESSMENT:  Ventilator    ASSESSMENT:  Pt admitted post PEA arrest. PMH significant for HTN, ESRD on HD, T1DM, anemia of chronic disease, secondary hyperparathyroidism, CVA, chondromalacia.   8/21: admitted post PEA arrest, intubated  Patient is currently intubated on ventilator support MV: 7 L/min Temp (24hrs), Avg:98.2 F (36.8 C), Min:96.8 F (36 C), Max:99.7 F (37.6 C)  Concern for anoxic brain injury. Remains on continuous EEG. Goals of care discussions ongoing.   Discussed nutrition plan with MD. Amenable to initiation of tube feeding.   Abdominal xray yesterday shows mild gaseous distension of the stomach. Bowel patten is non-obstructive with moderate retained stool.  Abdomen soft, hypoactive bowel sounds noted.   Medical history reflects pt with type 1 diabetes though question whether this is accurate as pt's blood sugars have been stable and does not have any recorded insulin  on home medication list.   Received HD overnight.  Net UF 1.5L Next HD on Saturday.   EDW: 41 kg Current weight: 46.9 kg (generalized non-pitting edema)  Drains/lines: OGT (tip within the stomach) Right internal jugular HD cath  UOP: x24 hours  Medications reviewed Drips: Levo @ 4 mcg/min Propofol  @ 15ml/hr (provides 634kcal from lipids per day)  Labs:  Potassium 3.1 (repletion ordered) BUN 37 Cr 7.63 Phosphorus  6.0 GFR 5 CBG's 71-125 x24 hours  NUTRITION - FOCUSED PHYSICAL EXAM: Deferred to follow up assessment.   Diet Order:   Diet Order             Diet NPO time specified  Diet effective now                   EDUCATION NEEDS:  No education needs have been identified at this time  Skin:  Skin Assessment: Reviewed RN Assessment (mid sacrum skin tear)  Last BM:  unknown/PTA  Height:   Ht Readings from Last 1 Encounters:  09/14/23 5' (1.524 m)    Weight:   Wt Readings from Last 1 Encounters:  09/15/23 46.9 kg   BMI:  Body mass index is 20.19 kg/m.  Estimated Nutritional Needs:   Kcal:  1200-1400  Protein:  60-75g  Fluid:  1.5L  Royce Maris, RDN, LDN Clinical Nutrition See AMiON for contact information.

## 2023-09-15 NOTE — IPAL (Signed)
  Interdisciplinary Goals of Care Family Meeting   Date carried out:: 09/15/2023  Location of the meeting: Bedside  Member's involved: Physician, Bedside Registered Nurse, and Family Member or next of kin  Durable Power of Attorney or acting medical decision maker: Husband    Discussion: We discussed goals of care for [name] .  Discussed her presentation with cardiac arrest, seizure and concern for anoxic brain injury. Recommended DNR status while we wait for neuro prognostication.  Husband wants to discuss with the rest of the family and get back to us .  Code status: Full Code  Disposition: Continue current acute care   Time spent for the meeting: 10 mins  Lonna Coder MD [facility] See Amion for pager  If no response to pager , please call [phone] until 7pm After 7:00 pm call Elink  [phone] 09/15/2023, 3:05 PM

## 2023-09-15 NOTE — Progress Notes (Signed)
   09/15/23 2211  Spiritual Encounters  Type of Visit Initial  Care provided to: Family  Referral source Family  Reason for visit Urgent spiritual support  OnCall Visit Yes  Spiritual Framework  Presenting Themes Values and beliefs;Meaning/purpose/sources of inspiration;Impactful experiences and emotions  Community/Connection Family;Significant other  Patient Stress Factors None identified  Family Stress Factors Health changes;Major life changes  Interventions  Spiritual Care Interventions Made Compassionate presence;Established relationship of care and support;Prayer  Intervention Outcomes  Outcomes Awareness of health;Awareness of support   Chaplain visited family, introducing self, and provided support at the husband's request for prayer. A word of prayer was offered during this challenging time for the Pt who was currently transitioning. Chaplain provide spiritual and emotional support, reminded the family that chaplain services remain available, and assured them they are not alone as they journey through this process.

## 2023-09-15 NOTE — Progress Notes (Signed)
 NEUROLOGY CONSULT FOLLOW UP NOTE   Date of service: September 15, 2023 Patient Name: Judy Chang MRN:  [id number] DOB:  01-May-1950  Interval Hx/subjective   No significant change  Vitals   Vitals:   09/15/23 1030 09/15/23 1100 09/15/23 1147 09/15/23 1200  BP: 131/78 130/81 136/74 (!) 83/55  Pulse: 72 76 66 70  Resp: 20 20 20 20   Temp: 98.4 F (36.9 C) 98.2 F (36.8 C) 98.1 F (36.7 C) 97.9 F (36.6 C)  TempSrc:      SpO2: 96% 96% 95% 100%  Weight:      Height:         Body mass index is 20.19 kg/m.  Physical Exam   Constitutional: Appears well-developed and well-nourished.   Neurologic Examination    MS: Does not open eyes or follow commands CN: Pupils are reactive, corneals are intact Motor: No movement to noxious stimulation Sensory: As above  Medications  Current Facility-Administered Medications:    0.9 %  sodium chloride  infusion, 250 mL, Intravenous, Continuous, Ogan, Okoronkwo U, MD, Held at 09/15/23 0431   Chlorhexidine  Gluconate Cloth 2 % PADS 6 each, 6 each, Topical, Q0600, Geralynn Charleston, MD, 6 each at 09/15/23 0551   clevidipine  (CLEVIPREX ) infusion 0.5 mg/mL, 0-21 mg/hr, Intravenous, Continuous, Maree Harder, MD, Stopped at 09/14/23 1811   famotidine  (PEPCID ) tablet 10 mg, 10 mg, Per Tube, Daily, Maree Harder, MD, 10 mg at 09/15/23 9057   heparin  injection 1,000 Units, 1,000 Units, Intracatheter, PRN, Geralynn Charleston, MD, 3,200 Units at 09/15/23 0658   heparin  injection 1,000 Units, 1,000 Units, Dialysis, PRN, Geralynn Charleston, MD   heparin  injection 5,000 Units, 5,000 Units, Subcutaneous, Q8H, Maree Harder, MD, 5,000 Units at 09/15/23 0551   hydrALAZINE  (APRESOLINE ) injection 10 mg, 10 mg, Intravenous, Q4H PRN, Maree Harder, MD, 10 mg at 09/14/23 9353   levETIRAcetam  (KEPPRA ) undiluted injection 1,000 mg, 1,000 mg, Intravenous, Q12H, Judithe, Erin C, NP, 1,000 mg at 09/15/23 0431   midazolam  (VERSED ) injection 2 mg, 2 mg, Intravenous, Q1H PRN,  Mannam, Praveen, MD, 2 mg at 09/15/23 0754   norepinephrine  (LEVOPHED ) 4mg  in (0.016 mg/mL) premix infusion, 0-10 mcg/min, Intravenous, Titrated, Ogan, Okoronkwo U, MD, Stopped at 09/15/23 9257   Oral care mouth rinse, 15 mL, Mouth Rinse, Q2H, Mannam, Praveen, MD, 15 mL at 09/15/23 1255   Oral care mouth rinse, 15 mL, Mouth Rinse, PRN, Mannam, Praveen, MD   potassium chloride  10 mEq in 100 mL IVPB, 10 mEq, Intravenous, Q1 Hr x 4, Schertz, Robert, MD, Last Rate: 100 mL/hr at 09/15/23 1253, 10 mEq at 09/15/23 1253   propofol  (DIPRIVAN ) 1000 MG/100ML infusion, 0-80 mcg/kg/min, Intravenous, Continuous, Pollina, Lonni PARAS, MD, Last Rate: 24 mL/hr at 09/15/23 1215, 80 mcg/kg/min at 09/15/23 1215   valproate (DEPACON ) 500 mg in dextrose  5 % 50 mL IVPB, 500 mg, Intravenous, Q8H, Michaela Aisha SQUIBB, MD  Labs and Diagnostic Imaging   Imaging(Personally reviewed): Initial head CT is negative  Assessment   Judy Chang is a 73 y.o. female status post cardiac arrest with concern for postarrest seizure.  She does have a concerning pattern on her EEG, and generalized periodic discharges are highly correlated with, though not definitive for poor prognosis.  Would favor continuing to treat these for the time being, and seeing how she does.  Recommendations  Depacon  500 3 times daily Keppra  500 twice daily Continue propofol  Neurology will continue to follow ______________________________________________________________________  This patient is critically ill and at significant risk of neurological  worsening, death and care requires constant monitoring of vital signs, hemodynamics,respiratory and cardiac monitoring, neurological assessment, discussion with family, other specialists and medical decision making of high complexity. I spent 40 minutes of neurocritical care time  in the care of  this patient. This was time spent independent of any time provided by nurse practitioner or  PA.  Aisha Seals, MD [facility]   If 7pm- 7am, please page neurology on call as listed in AMION. 09/15/2023  1:15 PM

## 2023-09-15 NOTE — Procedures (Addendum)
 Patient Name: Judy Chang  MRN: [id number]  Epilepsy Attending: Arlin MALVA Krebs  Referring Physician/Provider: Judithe Rocky BROCKS, NP  Duration: 09/14/2023 1810 to 09/15/2023 1810  Patient history: 73yo F s/p cardiac arrest. EEG to evaluate for seizure  Level of alertness:  comatose  AEDs during EEG study: Propofol   Technical aspects: This EEG study was done with scalp electrodes positioned according to the 10-20 International system of electrode placement. Electrical activity was reviewed with band pass filter of 1-70Hz , sensitivity of 7 uV/mm, display speed of 75mm/sec with a 60Hz  notched filter applied as appropriate. EEG data were recorded continuously and digitally stored.  Video monitoring was available and reviewed as appropriate.  Description: EEG showed burst suppression pattern with highly epileptiform bursts lasting 0.5 seconds alternate with 0.5 to 1 seconds of generalized EEG suppression.  Hyperventilation and photic stimulation were not performed.     Event button was pressed on 09/15/2023 at 0752.  Per RN patient had twitching of mouth and eyes.  Concomitant EEG showed generalized epileptiform bursts consistent with myoclonic seizure.  ABNORMALITY - Burst suppression with highly epileptiform bursts, generalized  IMPRESSION: This study showed evidence of epileptogenicity with generalized onset.  Given the frequency of burst, this EEG pattern is on the ictal-interictal continuum with high suspicion for ictal nature.  Additionally there was evidence of severe to profound diffuse encephalopathy.    One event was recorded on 09/13/2023 at 0752 during which patient had twitching of mouth and eyes.  Concomitant EEG showed generalized epileptiform bursts consistent with myoclonic seizure  In the setting of cardiac arrest, this EEG pattern is suggestive of anoxic/hypoxic brain injury.  Dr. Theophilus was notified.  Zakirah Weingart O Merion Grimaldo

## 2023-09-15 NOTE — Progress Notes (Signed)
 [facility] Progress Note  Subjective:  Had HD last night  Vitals:   09/15/23 1300 09/15/23 1315 09/15/23 1330 09/15/23 1345  BP: (!) 144/55 (!) 161/61 (!) 153/54 (!) 132/49  Pulse: 63 60 61 65  Resp: 20 20 18  (!) 0  Temp: 97.7 F (36.5 C) 97.7 F (36.5 C) (!) 97.5 F (36.4 C) (!) 97.5 F (36.4 C)  TempSrc:      SpO2: 97% 98% 98% 97%  Weight:      Height:        Exam: Gen on vent, sedated, slight jerking diffusely No rash, cyanosis or gangrene Sclera anicteric, throat w/ ETT No jvd, neck veins are flat Chest clear anterior/ lateral RRR no MRG Abd soft ntnd no mass or ascites +bs GU defer MS no joint effusions or deformity Ext no LE edema, no wounds or ulcers Neuro is on vent, sedated    RIJ TDC   Home bp meds: Norvasc  5 every day (not taking) Coreg  12.5 bid Clonidine  0.2mg  patch weekly Aldactone 25 mg every day Others: trazodone , zanaflex , renvela 2 ac, oxy IR prn, Keppra , remeron, cymbalta , lactulose, wellbutrin  xl    [facility] 3.[facility]   B350  41kg  2K bath  TDC   Heparin  2000 Last HD 8/19, post wt 43.2kg  Usually gets to dry wt        Assessment/ Plan: S/P cardiac arrest: this am 8/21 at home, s/p 19 min of CPR til ROSC. Per CCM.  ESRD: on HD TTS. Last HD Tuesday. Had HD last night. Next HD Saturday.  HTN: taking 2-3 bp meds at home. Getting IV cleviprex  infusion here.  Volume: 2-3 kg by wts, UF goal the same Anemia of esrd: Hb 12 here, follow.    Myer Fret MD  [facility] 09/15/2023, 2:36 PM  Recent Labs  Lab 09/14/23 0450 09/14/23 0553 09/14/23 0725 09/15/23 0420 09/15/23 0455  HGB  --  12.2 12.7  --  10.9*  ALBUMIN  2.8*  --   --   --   --   CALCIUM 9.0  --   --  8.5*  --   PHOS  --   --   --  6.0*  --   CREATININE 6.97*  --  5.45* 7.63*  --   K 4.0 3.3*  --  3.1*  --    No results for input(s): IRON, TIBC, FERRITIN in the last 168 hours. Inpatient medications:  Chlorhexidine  Gluconate Cloth  6 each Topical Q0600    famotidine   10 mg Per Tube Daily   heparin   5,000 Units Subcutaneous Q8H   levETIRAcetam   500 mg Intravenous Q12H   mouth rinse  15 mL Mouth Rinse Q2H    sodium chloride  Stopped (09/15/23 0431)   clevidipine  Stopped (09/14/23 1811)   norepinephrine  (LEVOPHED ) Adult infusion 4 mcg/min (09/15/23 1300)   potassium chloride  10 mEq (09/15/23 1358)   propofol  (DIPRIVAN ) infusion 80 mcg/kg/min (09/15/23 1300)   valproate sodium      heparin , heparin , hydrALAZINE , midazolam , mouth rinse

## 2023-09-15 NOTE — Plan of Care (Signed)

## 2023-09-15 NOTE — Progress Notes (Signed)
 HD Note:  Some information was entered later than the data was gathered due to patient care needs. The stated time with the data is accurate.  Received patient in bed for bedside HD   Unresponsive.   Informed consent signed and in chart.   Access used: [facility] Access issues: None  Patient tolerated treatment well.   TX duration:  2.5 hours  Unresponsive. On vent without acute distress.  Total UF removed: 1.5L  Hand-off given to patient's nurse.   Terressa Evola RN [facility].

## 2023-09-15 NOTE — Progress Notes (Signed)
 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  History form chart and husband at bedside.  Patient apparently in her usual state of health.  She had no complaints per husband prior to going to bed last night.  She woke up in the middle of the night to go to the bathroom and she had labored breathing.  Husband woke up as well to check on her and she fell to the ground and he did not feel a pulse and he called 911.  Husband says he did perform CPR until EMS arrived.  EMS found patient in PEA arrest and did 19 min of CPR, receiving 4 doses of Epinephrine  IV pushes along with Calcium and Bicarb IV pushes.  She was intubated in the field by EMS.  In the ED she was placed on propofol  drip and her SBP in the 190s.  Her labs revealed a potassium of 7 initially and she was given Lokelma .  Repeat K was 4.0.  LA in ED 10.6, CT head and c-spine negative for fractures or acute issues.  Patient with c-collar in ED. She got dialysis yesterday and has not missed her dialysis except for once last week according to husband.  Pertinent  Medical History   HTN, ESRD on HD, S/p right tunneled dialysis catheter, DMT1, Anemia of chronic disease, Secondary Hyperparathyroidism, CVA with no residual weakness 1 year ago, OA, Chondromalacia, Spinal stenosis, DDD, chronic back pain  Significant Hospital Events: Including procedures, antibiotic start and stop dates in addition to other pertinent events   8/21: Admit to ICU  Interim History / Subjective:   Remains on  ventilator, low-dose norepinephrine  Started on Keppra  for seizure activity  Objective    Blood pressure (!) 195/94, pulse 80, temperature 98.1 F (36.7 C), resp. rate (!) 22, height 5' (1.524 m), weight 46.9 kg, SpO2 (!) 83%.    Vent Mode: PRVC FiO2 (%):  [40 %-50 %] 40 % Set Rate:  [20 bmp] 20 bmp Vt Set:  [360 mL] 360 mL PEEP:  [5 cmH20] 5 cmH20 Plateau Pressure:  [12 cmH20-14 cmH20] 14 cmH20   Intake/Output Summary (Last 24 hours) at 09/15/2023 0908 Last data filed at 09/15/2023 0740 Gross per 24 hour  Intake 304.92 ml  Output 1800 ml  Net -1495.08 ml   Filed Weights   09/14/23 0440 09/14/23 0743 09/15/23 0457  Weight: 49.9 kg 44.5 kg 46.9 kg    Examination: Gen:      No acute distress HEENT:  EOMI, sclera anicteric, ETT Neck:     No masses; no thyromegaly Lungs:    Clear to auscultation bilaterally; normal respiratory effort CV:         Regular rate and rhythm; no murmurs Abd:      + bowel sounds; soft, non-tender; no palpable masses, no distension Ext:    No edema; adequate peripheral perfusion Neuro:, Comatose, unresponsive.  No pupillary or gag reflex.  Has spontaneous breaths.  Labs/imaging reviewed Significant for potassium 3.1, BUN/creatinine 37/7.63 Troponin 346, lactic acid improved to 1.0 WBC 9.6, hemoglobin 10.9,  platelets 117 No new imaging  Resolved problem list   Assessment and Plan  PEA Cardiac arrest.  May be related to hyperkalemia Myoclonic seizures Concern for anoxic brain injury Normothermia protocol Neuro monitoring.  Assess for recovery of neurological function Will discuss with family Needs MRI brain  Acute hypoxic respiratory failure Intubated, continue vent support SBT's when mental status allows Follow intermittent chest x-ray  End Stage renal disease Hyperkalemia> improved with Lokelma  Lactic acidosis-resolving Nephrology consulted  Hypertensive emergency Off Cleviprex  Hydralazine  as needed Add home medications if blood  pressure increases-Norvasc , Coreg , clonidine  but patient was not taking it regularly per pharmacy.  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  Best Practice (right click and Reselect all SmartList Selections daily)   Diet/type: NPO DVT prophylaxis prophylactic heparin   Pressure ulcer(s): N/A GI prophylaxis: H2B Lines: N/A Foley:  Yes, and it is still needed Code Status:  full code  Critical care time:   The patient is critically ill with multiple organ system failure and requires high complexity decision making for assessment and support, frequent evaluation and titration of therapies, advanced monitoring, review of radiographic studies and interpretation of complex data.   Critical Care Time devoted to patient care services, exclusive of separately billable procedures, described in this note is  minutes.   Tenoch Mcclure MD  Pulmonary & Critical care See Amion for pager  If no response to pager , please call [phone] until 7pm After 7:00 pm call Elink  [phone] 09/15/2023, 9:08 AM

## 2023-09-15 NOTE — Progress Notes (Signed)
 eLink Physician-Brief Progress Note Patient Name: WENONAH MILO DOB: 1950-07-10 MRN: [id number]   Date of Service  09/15/2023  HPI/Events of Note  Patient with soft blood pressures.  eICU Interventions  Peripheral Norepinephrine  gtt ordered.        Williemae Muriel U Christina Gintz 09/15/2023, 4:20 AM

## 2023-09-15 NOTE — Progress Notes (Signed)
 LTM maint complete - no skin breakdown under: GRND, REF, F3

## 2023-09-16 ENCOUNTER — Inpatient Hospital Stay (HOSPITAL_COMMUNITY)

## 2023-09-16 DIAGNOSIS — G253 Myoclonus: Secondary | ICD-10-CM | POA: Diagnosis not present

## 2023-09-16 DIAGNOSIS — R569 Unspecified convulsions: Secondary | ICD-10-CM | POA: Diagnosis not present

## 2023-09-16 DIAGNOSIS — J9601 Acute respiratory failure with hypoxia: Secondary | ICD-10-CM | POA: Diagnosis not present

## 2023-09-16 DIAGNOSIS — R579 Shock, unspecified: Secondary | ICD-10-CM

## 2023-09-16 DIAGNOSIS — N186 End stage renal disease: Secondary | ICD-10-CM | POA: Diagnosis not present

## 2023-09-16 DIAGNOSIS — I469 Cardiac arrest, cause unspecified: Secondary | ICD-10-CM | POA: Diagnosis not present

## 2023-09-16 LAB — CBC
HCT: 27.8 % — ABNORMAL LOW (ref 36.0–46.0)
Hemoglobin: 8.9 g/dL — ABNORMAL LOW (ref 12.0–15.0)
MCH: 29 pg (ref 26.0–34.0)
MCHC: 32 g/dL (ref 30.0–36.0)
MCV: 90.6 fL (ref 80.0–100.0)
Platelets: 143 K/uL — ABNORMAL LOW (ref 150–400)
RBC: 3.07 MIL/uL — ABNORMAL LOW (ref 3.87–5.11)
RDW: 17.2 % — ABNORMAL HIGH (ref 11.5–15.5)
WBC: 8.1 K/uL (ref 4.0–10.5)
nRBC: 0 % (ref 0.0–0.2)

## 2023-09-16 LAB — VALPROIC ACID LEVEL: Valproic Acid Lvl: 79 ug/mL (ref 50–100)

## 2023-09-16 LAB — COMPREHENSIVE METABOLIC PANEL WITH GFR
ALT: 12 U/L (ref 0–44)
AST: 26 U/L (ref 15–41)
Albumin: 2.8 g/dL — ABNORMAL LOW (ref 3.5–5.0)
Alkaline Phosphatase: 64 U/L (ref 38–126)
Anion gap: 15 (ref 5–15)
BUN: 44 mg/dL — ABNORMAL HIGH (ref 8–23)
CO2: 21 mmol/L — ABNORMAL LOW (ref 22–32)
Calcium: 8 mg/dL — ABNORMAL LOW (ref 8.9–10.3)
Chloride: 95 mmol/L — ABNORMAL LOW (ref 98–111)
Creatinine, Ser: 6.22 mg/dL — ABNORMAL HIGH (ref 0.44–1.00)
GFR, Estimated: 7 mL/min — ABNORMAL LOW (ref >60)
Glucose, Bld: 117 mg/dL — ABNORMAL HIGH (ref 70–99)
Potassium: 4.2 mmol/L (ref 3.5–5.1)
Sodium: 131 mmol/L — ABNORMAL LOW (ref 135–145)
Total Bilirubin: 0.6 mg/dL (ref 0.0–1.2)
Total Protein: 6 g/dL — ABNORMAL LOW (ref 6.5–8.1)

## 2023-09-16 LAB — GLUCOSE, CAPILLARY
Glucose-Capillary: 82 mg/dL (ref 70–99)
Glucose-Capillary: 73 mg/dL (ref 70–99)
Glucose-Capillary: 81 mg/dL (ref 70–99)
Glucose-Capillary: 106 mg/dL — ABNORMAL HIGH (ref 70–99)
Glucose-Capillary: 159 mg/dL — ABNORMAL HIGH (ref 70–99)
Glucose-Capillary: 92 mg/dL (ref 70–99)

## 2023-09-16 LAB — LACTIC ACID, PLASMA: Lactic Acid, Venous: 2.2 mmol/L (ref 0.5–1.9)

## 2023-09-16 LAB — PHOSPHORUS: Phosphorus: 4.3 mg/dL (ref 2.5–4.6)

## 2023-09-16 LAB — HEPATITIS B SURFACE ANTIBODY, QUANTITATIVE: Hep B S AB Quant (Post): <3.5 m[IU]/mL — ABNORMAL LOW

## 2023-09-16 MED ORDER — IPRATROPIUM-ALBUTEROL 0.5-2.5 (3) MG/3ML IN SOLN: 3.0000 mL | RESPIRATORY_TRACT | Status: DC | PRN

## 2023-09-16 MED ORDER — SODIUM CHLORIDE 0.9 % IV SOLN
1.5000 g | Freq: Two times a day (BID) | INTRAVENOUS | Status: DC
  Administered 2023-09-16 – 2023-09-18 (×6): 1.5 g via INTRAVENOUS
  Filled 2023-09-16 (×7): qty 4

## 2023-09-16 MED ORDER — ALBUMIN HUMAN 25 % IV SOLN
25.0000 g | Freq: Once | INTRAVENOUS | Status: AC
  Administered 2023-09-16: 25 g via INTRAVENOUS
  Filled 2023-09-16: qty 100

## 2023-09-16 MED ORDER — KETAMINE HCL-SODIUM CHLORIDE 1000-0.69 MG/100ML-% IV SOLN: 6.0000 mg/kg/h | INTRAVENOUS | Status: DC

## 2023-09-16 MED ORDER — KETAMINE HCL-SODIUM CHLORIDE 1000-0.69 MG/100ML-% IV SOLN: 4.0000 mg/kg/h | INTRAVENOUS | Status: DC

## 2023-09-16 MED ORDER — LACTATED RINGERS IV BOLUS
500.0000 mL | Freq: Once | INTRAVENOUS | Status: AC
  Administered 2023-09-16: 500 mL via INTRAVENOUS

## 2023-09-16 MED ORDER — MIDODRINE HCL 5 MG PO TABS
10.0000 mg | ORAL_TABLET | Freq: Three times a day (TID) | ORAL | Status: DC
  Administered 2023-09-16 – 2023-09-19 (×8): 10 mg
  Filled 2023-09-16 (×9): qty 2

## 2023-09-16 MED ORDER — CHLORHEXIDINE GLUCONATE CLOTH 2 % EX PADS
6.0000 | MEDICATED_PAD | Freq: Every day | CUTANEOUS | Status: DC
  Administered 2023-09-16 – 2023-09-19 (×4): 6 via TOPICAL

## 2023-09-16 MED ORDER — PHENYLEPHRINE HCL-NACL 20-0.9 MG/250ML-% IV SOLN
25.0000 ug/min | INTRAVENOUS | Status: DC
  Administered 2023-09-16 – 2023-09-17 (×2): 25 ug/min via INTRAVENOUS
  Administered 2023-09-17: 65 ug/min via INTRAVENOUS
  Administered 2023-09-17: 25 ug/min via INTRAVENOUS
  Filled 2023-09-16 (×3): qty 250

## 2023-09-16 MED ORDER — SODIUM CHLORIDE 0.9 % IV SOLN: 250.0000 mL | INTRAVENOUS | Status: AC

## 2023-09-16 MED ORDER — KETAMINE BOLUS VIA INFUSION
1.5000 mg/kg | Freq: Once | INTRAVENOUS | Status: AC
  Administered 2023-09-16: 70.35 mg via INTRAVENOUS
  Filled 2023-09-16: qty 75

## 2023-09-16 MED ORDER — KETAMINE HCL 10 MG/ML IJ SOLN
6.0000 mg/kg/h | Status: DC
  Administered 2023-09-16: 2 mg/kg/h via INTRAVENOUS
  Administered 2023-09-16: 6 mg/kg/h via INTRAVENOUS
  Administered 2023-09-16: 4 mg/kg/h via INTRAVENOUS
  Administered 2023-09-17 (×6): 6 mg/kg/h via INTRAVENOUS
  Filled 2023-09-16 (×13): qty 100

## 2023-09-16 NOTE — Plan of Care (Signed)

## 2023-09-16 NOTE — Progress Notes (Signed)
 [facility] Progress Note  Subjective:  Seen in room On vent, not responding  Vitals:   09/16/23 0615 09/16/23 0630 09/16/23 0645 09/16/23 0700  BP: (!) 136/45 (!) 146/58 (!) 147/48 (!) 148/45  Pulse: (!) 50 (!) 50 (!) 52 (!) 51  Resp: 20 (!) 22 (!) 21 20  Temp: 98.4 F (36.9 C) 98.4 F (36.9 C) 98.4 F (36.9 C) 98.2 F (36.8 C)  TempSrc:      SpO2: 98% 99% 98% 98%  Weight:      Height:        Exam: Gen on vent, sedated Sclera anicteric, throat w/ ETT No jvd, neck veins are flat Chest clear anterior/ lateral RRR no MRG Abd soft ntnd no mass or ascites +bs Ext no LE edema, no wounds or ulcers Neuro is on vent, sedated    RIJ TDC   Home bp meds: Norvasc  5 every day (not taking) Coreg  12.5 bid Clonidine  0.2mg  patch weekly Aldactone 25 mg every day  CXR 8/21: no edema    [facility] 3.[facility]   B350  41kg  2K bath  TDC   Heparin  2000 Last HD 8/19, post wt 43.2kg  Usually gets to dry wt        Assessment/ Plan: S/P cardiac arrest: this am 8/21 at home, s/p 19 min of CPR til ROSC. Per CCM.  ESRD: on HD TTS. Had HD here 8/21. Next HD today/ tonight.  HTN: was on cleviprex  gtt, now on levo/ neo gtts for bp support Volume: no sig vol excess on exam, wt's up 3-5kg Anemia of esrd: Hb 12 here, follow.    Myer Fret MD  [facility] 09/16/2023, 9:17 AM  Recent Labs  Lab 09/14/23 0450 09/14/23 0553 09/14/23 0725 09/15/23 0420 09/15/23 0455 09/15/23 1757  HGB  --  12.2 12.7  --  10.9*  --   ALBUMIN  2.8*  --   --   --   --   --   CALCIUM 9.0  --   --  8.5*  --   --   PHOS  --   --   --  6.0*  --  4.7*  CREATININE 6.97*  --  5.45* 7.63*  --   --   K 4.0 3.3*  --  3.1*  --   --    No results for input(s): IRON, TIBC, FERRITIN in the last 168 hours. Inpatient medications:  Chlorhexidine  Gluconate Cloth  6 each Topical Daily   famotidine   10 mg Per Tube Daily   feeding supplement (OSMOLITE 1.2 CAL)  1,000 mL Per Tube Q24H   feeding supplement  (PROSource TF20)  60 mL Per Tube Daily   heparin   5,000 Units Subcutaneous Q8H   levETIRAcetam   500 mg Intravenous Q12H   mouth rinse  15 mL Mouth Rinse Q2H    sodium chloride  Stopped (09/16/23 0612)   clevidipine  Stopped (09/14/23 1811)   lacosamide  (VIMPAT ) IV Stopped (09/15/23 1833)   midazolam  10 mg/hr (09/16/23 0600)   norepinephrine  (LEVOPHED ) Adult infusion 10 mcg/min (09/16/23 0600)   phenylephrine  (NEO-SYNEPHRINE) Adult infusion 25 mcg/min (09/16/23 9391)   propofol  (DIPRIVAN ) infusion Stopped (09/15/23 1826)   valproate sodium  55 mL/hr at 09/16/23 0600   heparin , heparin , hydrALAZINE , midazolam , mouth rinse

## 2023-09-16 NOTE — Progress Notes (Signed)
 NEUROLOGY CONSULT FOLLOW UP NOTE   Date of service: September 16, 2023 Patient Name: Judy Chang MRN:  [id number] DOB:  [birth year]  Interval Hx/subjective   She continues to have a highly concerning pattern on EEG, no clinical change  Vitals   Vitals:   09/16/23 0615 09/16/23 0630 09/16/23 0645 09/16/23 0700  BP: (!) 136/45 (!) 146/58 (!) 147/48 (!) 148/45  Pulse: (!) 50 (!) 50 (!) 52 (!) 51  Resp: 20 (!) 22 (!) 21 20  Temp: 98.4 F (36.9 C) 98.4 F (36.9 C) 98.4 F (36.9 C) 98.2 F (36.8 C)  TempSrc:      SpO2: 98% 99% 98% 98%  Weight:      Height:         Body mass index is 20.19 kg/m.  Physical Exam   Constitutional: Appears well-developed and well-nourished.   Neurologic Examination    MS: Does not open eyes or follow commands CN: Pupils are reactive, corneals are intact Motor: No movement to noxious stimulation Sensory: As above  Medications  Current Facility-Administered Medications:    0.9 %  sodium chloride  infusion, 250 mL, Intravenous, Continuous, Ogan, Okoronkwo U, MD, Held at 09/16/23 0612   Chlorhexidine  Gluconate Cloth 2 % PADS 6 each, 6 each, Topical, Daily, Mannam, Praveen, MD   clevidipine  (CLEVIPREX ) infusion 0.5 mg/mL, 0-21 mg/hr, Intravenous, Continuous, Maree Harder, MD, Stopped at 09/14/23 1811   famotidine  (PEPCID ) tablet 10 mg, 10 mg, Per Tube, Daily, Maree Harder, MD, 10 mg at 09/15/23 0942   feeding supplement (OSMOLITE 1.2 CAL) liquid 1,000 mL, 1,000 mL, Per Tube, Q24H, Mannam, Praveen, MD, Infusion Verify at 09/16/23 0600   feeding supplement (PROSource TF20) liquid 60 mL, 60 mL, Per Tube, Daily, Mannam, Praveen, MD, 60 mL at 09/15/23 1634   heparin  injection 1,000 Units, 1,000 Units, Intracatheter, PRN, Geralynn Charleston, MD, 3,200 Units at 09/15/23 9341   heparin  injection 1,000 Units, 1,000 Units, Dialysis, PRN, Geralynn Charleston, MD   heparin  injection 5,000 Units, 5,000 Units, Subcutaneous, Q8H, Maree Harder, MD, 5,000 Units at  09/16/23 9486   hydrALAZINE  (APRESOLINE ) injection 10 mg, 10 mg, Intravenous, Q4H PRN, Maree Harder, MD, 10 mg at 09/14/23 9353   lacosamide  (VIMPAT ) 200 mg in sodium chloride  0.9 % 25 mL IVPB, 200 mg, Intravenous, Q12H, Michaela Aisha SQUIBB, MD, Stopped at 09/15/23 1833   levETIRAcetam  (KEPPRA ) undiluted injection 500 mg, 500 mg, Intravenous, Q12H, Michaela Aisha SQUIBB, MD, 500 mg at 09/16/23 9486   midazolam  (VERSED ) 100 mg/100 mL (1 mg/mL) premix infusion, 10 mg/hr, Intravenous, Continuous, Michaela Aisha SQUIBB, MD, Last Rate: 10 mL/hr at 09/16/23 0600, 10 mg/hr at 09/16/23 0600   midazolam  (VERSED ) bolus via infusion 2 mg, 2 mg, Intravenous, Q1H PRN, Mannam, Praveen, MD, 2 mg at 09/16/23 0302   norepinephrine  (LEVOPHED ) 4mg  in (0.016 mg/mL) premix infusion, 0-10 mcg/min, Intravenous, Titrated, Ogan, Okoronkwo U, MD, Last Rate: 37.5 mL/hr at 09/16/23 0600, 10 mcg/min at 09/16/23 0600   Oral care mouth rinse, 15 mL, Mouth Rinse, Q2H, Mannam, Praveen, MD, 15 mL at 09/16/23 0514   Oral care mouth rinse, 15 mL, Mouth Rinse, PRN, Mannam, Praveen, MD   phenylephrine  (NEO-SYNEPHRINE) 20mg /NS 250mL premix infusion, 25-200 mcg/min, Intravenous, Titrated, Ogan, Okoronkwo U, MD, Last Rate: 18.75 mL/hr at 09/16/23 0608, 25 mcg/min at 09/16/23 0608   propofol  (DIPRIVAN ) 1000 MG/100ML infusion, 0-80 mcg/kg/min, Intravenous, Continuous, Pollina, Lonni PARAS, MD, Stopped at 09/15/23 1826   valproate (DEPACON ) 500 mg in dextrose  5 % 50 mL IVPB, 500  mg, Intravenous, Q8H, Michaela Aisha SQUIBB, MD, Last Rate: 55 mL/hr at 09/16/23 0600, Infusion Verify at 09/16/23 0600  Labs and Diagnostic Imaging   Imaging(Personally reviewed): Initial head CT is negative  Assessment   Judy Chang is a 73 y.o. female status post cardiac arrest with concern for postarrest seizure.  She does have a highly concerning pattern on her EEG, and generalized periodic discharges are highly correlated with poor  prognosis.  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  The pattern, though on the ictal-interictal continuum, is concerning and has not responded to high doses of propofol +benzodiazepine, currently we are having issues with hypotension despite two pressors and only 10 mg of Versed . the likelihood of a good outcome in a 73 year old suppressed with medication such as pentobarbital in the setting I think is exceedingly small.  We could use ketamine , and I will start this in conjunction with her midazolam  as it is relatively pressure sparing, but my suspicion is that her prognosis is poor.  Recommendations  Depacon  500 3 times daily Keppra  500 twice daily Continue midazolam  10 mg/h CT head Neurology will continue to follow ______________________________________________________________________  This patient is critically ill and at significant risk of neurological worsening, death and care requires constant monitoring of vital signs, hemodynamics,respiratory and cardiac monitoring, neurological assessment, discussion with family, other specialists and medical decision making of high complexity. I spent 40 minutes of neurocritical care time  in the care of  this patient. This was time spent independent of any time provided by nurse practitioner or PA.  Aisha Michaela, MD [facility]   If 7pm- 7am, please page neurology on call as listed in AMION. 09/16/2023  9:28 AM

## 2023-09-16 NOTE — Progress Notes (Signed)
 NAME:  GIULLIANA MCROBERTS, MRN:  [id number], DOB:  December 30, 1950, LOS: 2 ADMISSION DATE:  09/14/2023, CONSULTATION DATE:  09/14/2023 REFERRING MD:  Lonni Seats, MD, CHIEF COMPLAINT:  Cardiac Arrest  History of Present Illness:   73 y/o female who has a PMH for HTN, ESRD on HD, S/p right tunneled dialysis catheter, DMT1, Anemia of chronic disease, Secondary Hyperparathyroidism, CVA with no residual weakness 1 year ago, OA, Chondromalacia, Spinal stenosis, DDD, chronic back pain who presented post PEA arrest.  History form chart and husband at bedside.  Patient apparently in her usual state of health.  She had no complaints per husband prior to going to bed last night.  She woke up in the middle of the night to go to the bathroom and she had labored breathing.  Husband woke up as well to check on her and she fell to the ground and he did not feel a pulse and he called 911.  Husband says he did perform CPR until EMS arrived.  EMS found patient in PEA arrest and did 19 min of CPR, receiving 4 doses of Epinephrine  IV pushes along with Calcium and Bicarb IV pushes.  She was intubated in the field by EMS.  In the ED she was placed on propofol  drip and her SBP in the 190s.  Her labs revealed a potassium of 7 initially and she was given Lokelma .  Repeat K was 4.0.  LA in ED 10.6, CT head and c-spine negative for fractures or acute issues.  Patient with c-collar in ED. She got dialysis yesterday and has not missed her dialysis except for once last week according to husband.  Pertinent  Medical History   HTN, ESRD on HD, S/p right tunneled dialysis catheter, DMT1, Anemia of chronic disease, Secondary Hyperparathyroidism, CVA with no residual weakness 1 year ago, OA, Chondromalacia, Spinal stenosis, DDD, chronic back pain  Significant Hospital Events: Including procedures, antibiotic start and stop dates in addition to other pertinent events   8/21: Admit to ICU. Early myoclonus 8/22 AED escalation 8/23  pending CT H   Interim History / Subjective:   Ongoing concerning EEG findings   No labs ordered   Objective    Blood pressure (!) 118/49, pulse (!) 57, temperature 97.9 F (36.6 C), resp. rate 20, height 5' (1.524 m), weight 46.9 kg, SpO2 97%.    Vent Mode: PRVC FiO2 (%):  [40 %] 40 % Set Rate:  [20 bmp] 20 bmp Vt Set:  [360 mL] 360 mL PEEP:  [5 cmH20] 5 cmH20 Plateau Pressure:  [10 cmH20-13 cmH20] 13 cmH20   Intake/Output Summary (Last 24 hours) at 09/16/2023 1010 Last data filed at 09/16/2023 0600 Gross per 24 hour  Intake 2164.91 ml  Output 100 ml  Net 2064.91 ml   Filed Weights   09/14/23 0743 09/15/23 0457 09/16/23 0421  Weight: 44.5 kg 46.9 kg 46.9 kg    Examination: Gen:  critically and chronically ill elderly F  HEENT:  NCAT pink mm ETT secure Lungs:    Thick yellow/tan resp secretions  CV:         bradycardic rate  Abd:      Thin ndnt  Ext:    No obvious joint deformity  Neuro:, Deeply sedated.   Resolved problem list  HyperK Lactic acidosis  HTN emergency  Assessment and Plan   PEA arrest Myoclonic sz C/f anoxic brain injury Hx SAH, hx PRESS   P -pending CT H 8/23 -d/w neuro -- overall all very  concerning. Adding ketamine  to VPA Keppra  midaz gtt.  -will cont GOC talks w fam. Suspect poor prognosis.   Acute resp failure w hypoxia  P -cont MV support -not appropriate for WUA/SBT w eeg findings   ESRD  -I have ordered for CMP  -nephro following   Shock -overall labile Bps-- ?medication related vs other process  P -I have ordered CBC LA  -copious thick tan/yellow resp secretions -- am starting unasyn  and it looks like a trach asp was already ordered  -wean NE for SBP 90, looks like early AM started also on neo. Looks like we have room to wean but if unable then will need to discuss access w husband. Priority is getting CT   -will give a 500cc LR bolus   Best Practice (right click and Reselect all SmartList Selections daily)    Diet/type: NPO DVT prophylaxis prophylactic heparin   Pressure ulcer(s): N/A GI prophylaxis: H2B Lines: N/A Foley:  Yes, and it is still needed Code Status:  full code  Critical care time:   CRITICAL CARE Performed by: Ronnald FORBES Gave   Total critical care time: 37 minutes  Critical care time was exclusive of separately billable procedures and treating other patients. Critical care was necessary to treat or prevent imminent or life-threatening deterioration.  Critical care was time spent personally by me on the following activities: development of treatment plan with patient and/or surrogate as well as nursing, discussions with consultants, evaluation of patient's response to treatment, examination of patient, obtaining history from patient or surrogate, ordering and performing treatments and interventions, ordering and review of laboratory studies, ordering and review of radiographic studies, pulse oximetry and re-evaluation of patient's condition.  Ronnald Gave MSN, AGACNP-BC [facility] Amion for pager  09/16/2023, 10:10 AM

## 2023-09-16 NOTE — Procedures (Signed)
 Patient Name: Judy Chang  MRN: [id number]  Epilepsy Attending: Arlin MALVA Krebs  Referring Physician/Provider: Judithe Rocky BROCKS, NP  Duration: 09/15/2023 1810 to 09/16/2023 1810   Patient history: 73yo F s/p cardiac arrest. EEG to evaluate for seizure   Level of alertness:  comatose   AEDs during EEG study: Propofol    Technical aspects: This EEG study was done with scalp electrodes positioned according to the 10-20 International system of electrode placement. Electrical activity was reviewed with band pass filter of 1-70Hz , sensitivity of 7 uV/mm, display speed of 44mm/sec with a 60Hz  notched filter applied as appropriate. EEG data were recorded continuously and digitally stored.  Video monitoring was available and reviewed as appropriate.   Description: EEG showed burst suppression pattern with highly epileptiform bursts lasting 0.5 seconds alternate with 0.5 to 1 seconds of generalized EEG suppression. Hyperventilation and photic stimulation were not performed.      ABNORMALITY - Burst suppression with highly epileptiform bursts, generalized   IMPRESSION: This study showed evidence of epileptogenicity with generalized onset. This EEG pattern is on the ictal-interictal continuum with high suspicion for ictal nature. Additionally there was evidence of severe to profound diffuse encephalopathy. In the setting of cardiac arrest, this EEG pattern is suggestive of anoxic/hypoxic brain injury.  Yahir Tavano O [name]

## 2023-09-16 NOTE — IPAL (Signed)
  Interdisciplinary Goals of Care Family Meeting   Date carried out: 09/16/2023  Location of the meeting: Bedside  Member's involved: Nurse Practitioner and Family Member or next of kin  Durable Power of Attorney or acting medical decision maker: husband    Discussion: We discussed goals of care for [name] .  Provided clinical updates. Pts husband is understanding that this likely has a very poor functional prognosis and would not want prolonging measures. He would like to continue current care for now including MRI when indicated-- would like to get some family here.  We talked about code status. He shared that he doesn't want resuscitation but also wonders about 1 round of resuscitation efforts. We talked about what this entails, and that add'l resuscitative efforts in event of arrest would not positively impact her outcome. Decision made for DNR status.   Code status:   Code Status: Do not attempt resuscitation (DNR) PRE-ARREST INTERVENTIONS DESIRED   Disposition: Continue current acute care  Time spent for the meeting: 10 min     Ronnald FORBES Gave, NP  09/16/2023, 11:45 AM

## 2023-09-16 NOTE — Progress Notes (Addendum)
 Pharmacy Antibiotic Note  Judy Chang is a 73 y.o. female admitted on 09/14/2023 with pneumonia.  Pharmacy has been consulted for Unasyn  dosing. WBC trend down. Afebrile. LA wnl. Ongoing seizure activity. ESRD- HD, last HD 8/22 x2.5hrs. Next HD today/tonight.   Plan: Unasyn  1.5g IV every 12 hours.  Follow-up respiratory culture.   Height: 5' (152.4 cm) Weight: 46.9 kg (103 lb 6.3 oz) IBW/kg (Calculated) : 45.5  Temp (24hrs), Avg:98.1 F (36.7 C), Min:95.5 F (35.3 C), Max:100.8 F (38.2 C)  Recent Labs  Lab 09/14/23 0401 09/14/23 0442 09/14/23 0450 09/14/23 0725 09/14/23 0734 09/14/23 1440 09/15/23 0420 09/15/23 0447 09/15/23 0455  WBC 13.8*  --   --  12.9*  --   --   --   --  9.6  CREATININE  --  7.50* 6.97* 5.45*  --   --  7.63*  --   --   LATICACIDVEN  --  10.6*  --   --  5.5* 1.2  --  1.0  --     Estimated Creatinine Clearance: 4.6 mL/min (A) (by C-G formula based on SCr of 7.63 mg/dL (H)).    Allergies  Allergen Reactions   Citalopram Hydrobromide Hives and Rash   Nsaids Hives and Itching    Burning of the tongue    Pregabalin  Itching    Antimicrobials this admission: Unasyn  8/23 >>  Dose adjustments this admission:   Microbiology results: 8/21 Resp cx: pending 8/21 MRSA PCR (repeat): negative  Thank you for allowing pharmacy to be a part of this patient's care.  Harlene Boga, PharmD, BCPS, BCCCP Clinical Pharmacist Please refer to [facility] for [facility] numbers 09/16/2023 11:00 AM

## 2023-09-16 NOTE — Progress Notes (Signed)
 LTM maint complete - no skin breakdown under:  Fp1 F7 T7. Continue to monitor

## 2023-09-16 NOTE — Progress Notes (Signed)
 eLink Physician-Brief Progress Note Patient Name: Judy Chang DOB: Jun 24, 1950 MRN: [id number]   Date of Service  09/16/2023  HPI/Events of Note  BP 87/28, MAP 41, HR 56  eICU Interventions  Peripheral Phenylephrine  gtt added, Albumin  25 % 100 ml iv x 1.        [name] U [name] 09/16/2023, 5:57 AM

## 2023-09-16 NOTE — Progress Notes (Signed)
 Pt was transported to CT via ventilator with no apparent complications. Pt is back in 72M.

## 2023-09-17 ENCOUNTER — Inpatient Hospital Stay (HOSPITAL_COMMUNITY)

## 2023-09-17 DIAGNOSIS — R9089 Other abnormal findings on diagnostic imaging of central nervous system: Secondary | ICD-10-CM | POA: Diagnosis not present

## 2023-09-17 DIAGNOSIS — J9601 Acute respiratory failure with hypoxia: Secondary | ICD-10-CM | POA: Diagnosis not present

## 2023-09-17 DIAGNOSIS — G931 Anoxic brain damage, not elsewhere classified: Secondary | ICD-10-CM | POA: Diagnosis not present

## 2023-09-17 DIAGNOSIS — N186 End stage renal disease: Secondary | ICD-10-CM | POA: Diagnosis not present

## 2023-09-17 DIAGNOSIS — G253 Myoclonus: Secondary | ICD-10-CM | POA: Diagnosis not present

## 2023-09-17 DIAGNOSIS — R569 Unspecified convulsions: Secondary | ICD-10-CM | POA: Diagnosis not present

## 2023-09-17 DIAGNOSIS — I469 Cardiac arrest, cause unspecified: Secondary | ICD-10-CM | POA: Diagnosis not present

## 2023-09-17 LAB — BASIC METABOLIC PANEL WITH GFR
Anion gap: 11 (ref 5–15)
BUN: 33 mg/dL — ABNORMAL HIGH (ref 8–23)
CO2: 21 mmol/L — ABNORMAL LOW (ref 22–32)
Calcium: 7.9 mg/dL — ABNORMAL LOW (ref 8.9–10.3)
Chloride: 97 mmol/L — ABNORMAL LOW (ref 98–111)
Creatinine, Ser: 4.17 mg/dL — ABNORMAL HIGH (ref 0.44–1.00)
GFR, Estimated: 11 mL/min — ABNORMAL LOW (ref >60)
Glucose, Bld: 93 mg/dL (ref 70–99)
Potassium: 5 mmol/L (ref 3.5–5.1)
Sodium: 129 mmol/L — ABNORMAL LOW (ref 135–145)

## 2023-09-17 LAB — CBC
HCT: 31.5 % — ABNORMAL LOW (ref 36.0–46.0)
Hemoglobin: 10.1 g/dL — ABNORMAL LOW (ref 12.0–15.0)
MCH: 28.7 pg (ref 26.0–34.0)
MCHC: 32.1 g/dL (ref 30.0–36.0)
MCV: 89.5 fL (ref 80.0–100.0)
Platelets: 142 K/uL — ABNORMAL LOW (ref 150–400)
RBC: 3.52 MIL/uL — ABNORMAL LOW (ref 3.87–5.11)
RDW: 17.1 % — ABNORMAL HIGH (ref 11.5–15.5)
WBC: 7 K/uL (ref 4.0–10.5)
nRBC: 0.4 % — ABNORMAL HIGH (ref 0.0–0.2)

## 2023-09-17 LAB — GLUCOSE, CAPILLARY
Glucose-Capillary: 111 mg/dL — ABNORMAL HIGH (ref 70–99)
Glucose-Capillary: 64 mg/dL — ABNORMAL LOW (ref 70–99)
Glucose-Capillary: 154 mg/dL — ABNORMAL HIGH (ref 70–99)
Glucose-Capillary: 121 mg/dL — ABNORMAL HIGH (ref 70–99)
Glucose-Capillary: 121 mg/dL — ABNORMAL HIGH (ref 70–99)
Glucose-Capillary: 114 mg/dL — ABNORMAL HIGH (ref 70–99)
Glucose-Capillary: 151 mg/dL — ABNORMAL HIGH (ref 70–99)

## 2023-09-17 LAB — MAGNESIUM

## 2023-09-17 LAB — PHOSPHORUS: Phosphorus: 5.8 mg/dL — ABNORMAL HIGH (ref 2.5–4.6)

## 2023-09-17 MED ORDER — MIDODRINE HCL 5 MG PO TABS
10.0000 mg | ORAL_TABLET | Freq: Once | ORAL | Status: AC
  Administered 2023-09-17: 10 mg

## 2023-09-17 MED ORDER — PERAMPANEL 8 MG PO TABS: 8.0000 mg | ORAL_TABLET | Freq: Every day | ORAL | Status: DC

## 2023-09-17 MED ORDER — SODIUM PHOSPHATES 45 MMOLE/15ML IV SOLN
30.0000 mmol | Freq: Once | INTRAVENOUS | Status: AC
  Administered 2023-09-17: 30 mmol via INTRAVENOUS
  Filled 2023-09-17: qty 10

## 2023-09-17 MED ORDER — KETAMINE HCL-SODIUM CHLORIDE 1000-0.69 MG/100ML-% IV SOLN
4.0000 mg/kg/h | INTRAVENOUS | Status: DC
  Filled 2023-09-17 (×4): qty 100

## 2023-09-17 MED ORDER — MAGNESIUM SULFATE 4 GM/100ML IV SOLN
4.0000 g | Freq: Once | INTRAVENOUS | Status: AC
  Administered 2023-09-17: 4 g via INTRAVENOUS
  Filled 2023-09-17: qty 100

## 2023-09-17 MED ORDER — LACTATED RINGERS IV BOLUS
1000.0000 mL | Freq: Once | INTRAVENOUS | Status: AC
  Administered 2023-09-17: 1000 mL via INTRAVENOUS

## 2023-09-17 MED ORDER — POTASSIUM CHLORIDE 20 MEQ PO PACK
40.0000 meq | PACK | Freq: Once | ORAL | Status: AC
  Administered 2023-09-17: 40 meq
  Filled 2023-09-17: qty 2

## 2023-09-17 MED ORDER — KETAMINE HCL 10 MG/ML IJ SOLN
4.0000 mg/kg/h | Status: DC
  Administered 2023-09-17 – 2023-09-19 (×6): 4 mg/kg/h via INTRAVENOUS
  Filled 2023-09-17 (×8): qty 100

## 2023-09-17 NOTE — Progress Notes (Signed)
LTM maint complete - no skin breakdown under:  Fp1 Fp2 F7 [facility] monitored, Event button test confirmed by [facility].  

## 2023-09-17 NOTE — Progress Notes (Signed)
 On RN's assessment pupils 5mm and minimally reactive. Ronnald Gave, NP made aware.

## 2023-09-17 NOTE — Progress Notes (Signed)
 [facility] Progress Note  Subjective:  Seen in ICU Multiple gtt's including ketamine , versed , levo Also getting piggyback IV valproic  acid, keppra , lacosamide   Vitals:   09/17/23 0745 09/17/23 0800 09/17/23 0815 09/17/23 0830  BP: (!) 88/46 (!) 80/44 (!) 78/43 (!) 100/46  Pulse: (!) 54 (!) 51 (!) 51 (!) 51  Resp: 17 18 20 20   Temp: (!) 96.4 F (35.8 C) (!) 96.4 F (35.8 C) (!) 96.6 F (35.9 C) (!) 96.6 F (35.9 C)  TempSrc:      SpO2: 100% 99% 96% 93%  Weight:      Height:        Exam: Gen on vent, not responsive Sclera anicteric, throat w/ ETT No jvd, neck veins are flat Chest clear anterior/ lateral RRR no MRG Abd soft ntnd no mass or ascites +bs Ext no LE edema, no wounds or ulcers Neuro is on vent, sedated    RIJ TDC   Home bp meds: Norvasc  5 every day (not taking) Coreg  12.5 bid Clonidine  0.2mg  patch weekly Aldactone 25 mg every day  CXR 8/21: no edema    OP HD: [facility] 3.5h   B350  41kg  2K bath  TDC   Heparin  2000 Last HD 8/19, post wt 43.2kg  Usually gets to dry wt        Assessment/ Plan: S/P cardiac arrest: at home, s/p 19 min of CPR til ROSC  ESRD: on HD [facility]. Had HD here 8/21 and 8/23. Next HD 09/27/23.  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    Myer Fret MD  [facility] 09/17/2023, 8:38 AM  Recent Labs  Lab 09/14/23 0450 09/14/23 0553 09/16/23 0912 09/16/23 1200 09/17/23 0337  HGB  --    < >  --  8.9* 10.1*  ALBUMIN  2.8*  --   --  2.8*  --   CALCIUM 9.0   < >  --  8.0* 8.4*  PHOS  --    < > 4.3  --  <1.0*  CREATININE 6.97*   < >  --  6.22* 0.56  K 4.0   < >  --  4.2 2.8*   < > = values in this interval not displayed.   No results for input(s): IRON, TIBC, FERRITIN in the last 168 hours. Inpatient medications:  Chlorhexidine  Gluconate Cloth  6 each Topical Daily   famotidine   10 mg Per Tube Daily    feeding supplement (OSMOLITE 1.2 CAL)  1,000 mL Per Tube Q24H   feeding supplement (PROSource TF20)  60 mL Per Tube Daily   heparin   5,000 Units Subcutaneous Q8H   levETIRAcetam   500 mg Intravenous Q12H   midodrine   10 mg Per Tube TID WC   midodrine   10 mg Per Tube Once   mouth rinse  15 mL Mouth Rinse Q2H    ampicillin -sulbactam (UNASYN ) IV Stopped (09/17/23 0000)   ketamine  (KETALAR ) adult infusion 6 mg/kg/hr (09/17/23 0818)   lacosamide  (VIMPAT ) IV Stopped (09/16/23 2159)   lactated ringers      magnesium  sulfate bolus IVPB 50 mL/hr at 09/17/23 0800   midazolam  10 mg/hr (09/17/23 0800)   norepinephrine  (LEVOPHED ) Adult infusion 10 mcg/min (09/17/23 0800)   phenylephrine  (NEO-SYNEPHRINE) Adult infusion 25 mcg/min (09/17/23 0817)   propofol  (DIPRIVAN ) infusion Stopped (09/15/23 1826)   sodium PHOSPHATE  IVPB (in mmol) 43 mL/hr at 09/17/23 0800  valproate sodium  Stopped (09/17/23 9362)   heparin , ipratropium-albuterol , midazolam , mouth rinse

## 2023-09-17 NOTE — Progress Notes (Addendum)
 NAME:  Judy Chang, MRN:  [id number], DOB:  17-Aug-1950, LOS: 3 ADMISSION DATE:  09/14/2023, CONSULTATION DATE:  09/14/2023 REFERRING MD:  Lonni Seats, MD, CHIEF COMPLAINT:  Cardiac Arrest  History of Present Illness:   73 y/o female who has a PMH for HTN, ESRD on HD, S/p right tunneled dialysis catheter, DMT1, Anemia of chronic disease, Secondary Hyperparathyroidism, CVA with no residual weakness 1 year ago, OA, Chondromalacia, Spinal stenosis, DDD, chronic back pain who presented post PEA arrest.  History form chart and husband at bedside.  Patient apparently in her usual state of health.  She had no complaints per husband prior to going to bed last night.  She woke up in the middle of the night to go to the bathroom and she had labored breathing.  Husband woke up as well to check on her and she fell to the ground and he did not feel a pulse and he called 911.  Husband says he did perform CPR until EMS arrived.  EMS found patient in PEA arrest and did 19 min of CPR, receiving 4 doses of Epinephrine  IV pushes along with Calcium and Bicarb IV pushes.  She was intubated in the field by EMS.  In the ED she was placed on propofol  drip and her SBP in the 190s.  Her labs revealed a potassium of 7 initially and she was given Lokelma .  Repeat K was 4.0.  LA in ED 10.6, CT head and c-spine negative for fractures or acute issues.  Patient with c-collar in ED. She got dialysis yesterday and has not missed her dialysis except for once last week according to husband.  Pertinent  Medical History   HTN, ESRD on HD, S/p right tunneled dialysis catheter, DMT1, Anemia of chronic disease, Secondary Hyperparathyroidism, CVA with no residual weakness 1 year ago, OA, Chondromalacia, Spinal stenosis, DDD, chronic back pain  Significant Hospital Events: Including procedures, antibiotic start and stop dates in addition to other pertinent events   8/21: Admit to ICU. Early myoclonus 8/22 AED escalation 8/23   ketamine  added, unasyn  added  8/24 for MRI brain   Interim History / Subjective:   Not improving w ketamine  addition   1.8L off w HD overnight   Variable low dose periph pressors   Objective    Blood pressure (!) 109/40, pulse (!) 47, temperature (!) 96.3 F (35.7 C), resp. rate 20, height 5' (1.524 m), weight 43.5 kg, SpO2 100%.    Vent Mode: PRVC FiO2 (%):  [40 %] 40 % Set Rate:  [20 bmp] 20 bmp Vt Set:  [360 mL] 360 mL PEEP:  [5 cmH20] 5 cmH20 Plateau Pressure:  [9 cmH20-15 cmH20] 12 cmH20   Intake/Output Summary (Last 24 hours) at 09/17/2023 0948 Last data filed at 09/17/2023 0900 Gross per 24 hour  Intake 3708.16 ml  Output 1906 ml  Net 1802.16 ml   Filed Weights   09/16/23 0421 09/17/23 0130 09/17/23 0445  Weight: 46.9 kg 45.3 kg 43.5 kg    Examination: Gen:  critically and chronically ill elderly F intubated sedated  HEENT:  NCAT ETT secure  Lungs:    Symmetrical chest expansion, tan resp secretions  CV:        bradycardic  Abd:    ndnt  GU:     foley  Ext: No obivous acute joint deformity    Resolved problem list  HyperK Lactic acidosis  HTN emergency  Assessment and Plan   GOC DNR P -husband understands that we  suspect a very low likelihood of meaningful outcome and knows she would not want low QOL or prolonged sustaining measures -wants to get MRI 8/24  -wasn't sure 8/23 if he would want CVC placement, so trying to use periph as much as feasible but becoming more difficult w progressive hypotension detailed below.    **Addendum 10:37 - husband arrived, talked about plan for MRI and goals of care. Decided against moving forward with a central line. Understands risks associated w duration/dose via PIV and accepts. Will incr ceiling on NE   PEA arrest Myoclonic sz Suspected anoxic brain injury Hx SAH P -for MRI brain today -AEDs per neuro -- even w addition of ket gtt there has not been meaningful eeg improvement.   Shock  -labile pressures  but hypotensive, bradycardic -- could be medication related but I am suspicious of neuro dysregulation -had 1.8L off w HD and is not overloaded  P -midodrine  -cont titrating pressors as you are. She has a variable need at at the moment is on low doses of 2 periph agents. RN is working on Child psychotherapist to 1 agent  -giving 1L LR as well  -labs not suggestive of sepsis but did start on unasyn  8/23 w thick yellow resp secretions -- trach asp pending   Acute resp failure w hypoxia  Possible aspiration PNA  P -cont MV support -unasyn  for possible aspiraiton   ESRD  Hypomag HypoK Hypophos Hyponatremia P -nephro following - HD per nephro  -replace lytes as needed  -BMP mag phos in AM    Best Practice (right click and Reselect all SmartList Selections daily)   Diet/type: NPO DVT prophylaxis prophylactic heparin   Pressure ulcer(s): N/A GI prophylaxis: H2B Lines: N/A (perm cath)  Foley:  Yes, and it is still needed Code Status:  DNR  Critical care time:   CRITICAL CARE Performed by: Ronnald FORBES Gave  Total critical care time: 38 minutes   Critical care time was exclusive of separately billable procedures and treating other patients. Critical care was necessary to treat or prevent imminent or life-threatening deterioration.  Critical care was time spent personally by me on the following activities: development of treatment plan with patient and/or surrogate as well as nursing, discussions with consultants, evaluation of patient's response to treatment, examination of patient, obtaining history from patient or surrogate, ordering and performing treatments and interventions, ordering and review of laboratory studies, ordering and review of radiographic studies, pulse oximetry and re-evaluation of patient's condition.  Ronnald Gave MSN, AGACNP-BC [facility] Amion for pager 09/17/2023, 9:48 AM

## 2023-09-17 NOTE — Progress Notes (Signed)
 LTM EEG hooked up and running - no initial skin breakdown - push button tested - [facility] monitoring.

## 2023-09-17 NOTE — Progress Notes (Signed)
 HD Note:  Some information was entered later than the data was gathered due to patient care needs. The stated time with the data is accurate.  Received patient in bed for bedside HD   Informed consent signed and in chart.   Access used: Left Chest [facility] Access issues: None  Patient tolerated treatment fair; on pressors.   TX duration:  2.5 hours  Unresponsive. On vent without acute distress.  Total UF removed: 1.8L.  Unable to make goal due to BP.   Hand-off given to patient's nurse.   Kaileena Obi RN [facility].

## 2023-09-17 NOTE — Progress Notes (Addendum)
 NEUROLOGY CONSULT FOLLOW UP NOTE   Date of service: September 17, 2023 Patient Name: Judy Chang MRN:  [id number] DOB:  12/31/1950  Interval Hx/subjective   GPEDs are slightly more widely spaced, but still with ongoing activity make me concerned that it continues to represent an ictal pattern  Vitals   Vitals:   09/17/23 1000 09/17/23 1015 09/17/23 1030 09/17/23 1045  BP: (!) 87/44 (!) 101/42  (!) 129/47  Pulse: (!) 50 (!) 50 (!) 55 (!) 54  Resp: 18 15 20 18   Temp: (!) 96.1 F (35.6 C) (!) 96.1 F (35.6 C) (!) 96.3 F (35.7 C) (!) 96.3 F (35.7 C)  TempSrc:      SpO2: 100% 99% 98% 97%  Weight:      Height:         Body mass index is 18.73 kg/m.  Physical Exam   Constitutional: Appears well-developed and well-nourished.   Neurologic Examination    MS: Does not open eyes or follow commands CN: Pupils are reactive, corneals are intact Motor: No movement to noxious stimulation Sensory: As above  Medications  Current Facility-Administered Medications:    ampicillin -sulbactam (UNASYN ) 1.5 g in sodium chloride  0.9 % 100 mL IVPB, 1.5 g, Intravenous, Q12H, Millen, Jessica B, RPH, Last Rate: 200 mL/hr at 09/17/23 1209, 1.5 g at 09/17/23 1209   Chlorhexidine  Gluconate Cloth 2 % PADS 6 each, 6 each, Topical, Daily, Mannam, Praveen, MD, 6 each at 09/17/23 0844   famotidine  (PEPCID ) tablet 10 mg, 10 mg, Per Tube, Daily, Maree Harder, MD, 10 mg at 09/17/23 0847   feeding supplement (OSMOLITE 1.2 CAL) liquid 1,000 mL, 1,000 mL, Per Tube, Q24H, Mannam, Praveen, MD, Infusion Verify at 09/17/23 1000   feeding supplement (PROSource TF20) liquid 60 mL, 60 mL, Per Tube, Daily, Mannam, Praveen, MD, 60 mL at 09/17/23 0846   heparin  injection 1,000 Units, 1,000 Units, Intracatheter, PRN, Geralynn Charleston, MD, 3,200 Units at 09/17/23 0421   heparin  injection 5,000 Units, 5,000 Units, Subcutaneous, Q8H, Maree Harder, MD, 5,000 Units at 09/17/23 0541   ipratropium-albuterol  (DUONEB) 0.5-2.5  (3) MG/3ML nebulizer solution 3 mL, 3 mL, Nebulization, Q4H PRN, Alva, Rakesh V, MD   ketamine  (KETALAR ) adult IV infusion (10 mg/mL), 6 mg/kg/hr, Intravenous, Continuous, Jude Harden GAILS, MD, Last Rate: 28.1 mL/hr at 09/17/23 1051, 6 mg/kg/hr at 09/17/23 1051   lacosamide  (VIMPAT ) 200 mg in sodium chloride  0.9 % 25 mL IVPB, 200 mg, Intravenous, Q12H, Michaela Aisha SQUIBB, MD, Last Rate: 90 mL/hr at 09/17/23 1037, 200 mg at 09/17/23 1037   levETIRAcetam  (KEPPRA ) undiluted injection 500 mg, 500 mg, Intravenous, Q12H, Michaela Aisha SQUIBB, MD, 500 mg at 09/17/23 0456   midazolam  (VERSED ) 100 mg/100 mL (1 mg/mL) premix infusion, 10 mg/hr, Intravenous, Continuous, Michaela Aisha SQUIBB, MD, Last Rate: 10 mL/hr at 09/17/23 1006, 10 mg/hr at 09/17/23 1006   midazolam  (VERSED ) bolus via infusion 2 mg, 2 mg, Intravenous, Q1H PRN, Mannam, Praveen, MD, 2 mg at 09/16/23 0302   midodrine  (PROAMATINE ) tablet 10 mg, 10 mg, Per Tube, TID WC, Bowser, Ronnald BRAVO, NP, 10 mg at 09/17/23 1210   norepinephrine  (LEVOPHED ) 4mg  in (0.016 mg/mL) premix infusion, 0-10 mcg/min, Intravenous, Titrated, Bowser, Grace E, NP, Last Rate: 37.5 mL/hr at 09/17/23 1019, 10 mcg/min at 09/17/23 1019   Oral care mouth rinse, 15 mL, Mouth Rinse, Q2H, Mannam, Praveen, MD, 15 mL at 09/17/23 0950   Oral care mouth rinse, 15 mL, Mouth Rinse, PRN, Mannam, Praveen, MD   phenylephrine  (NEO-SYNEPHRINE)  20mg /NS 250mL premix infusion, 25-200 mcg/min, Intravenous, Titrated, Ogan, Okoronkwo U, MD, Paused at 09/17/23 0925   sodium phosphate  30 mmol in sodium chloride  0.9 % 250 mL infusion, 30 mmol, Intravenous, Once, Paliwal, Aditya, MD, Last Rate: 43 mL/hr at 09/17/23 1000, Infusion Verify at 09/17/23 1000   valproate (DEPACON ) 500 mg in dextrose  5 % 50 mL IVPB, 500 mg, Intravenous, Q8H, Michaela Aisha SQUIBB, MD, Stopped at 09/17/23 [phone]  Labs and Diagnostic Imaging   Imaging(Personally reviewed): Initial head CT is negative  Assessment    Judy Chang is a 73 y.o. female status post cardiac arrest with concern for postarrest seizure.  She does have a highly concerning pattern on her EEG, and generalized periodic discharges are highly correlated with poor prognosis.  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  The pattern, though on the ictal-interictal continuum, is concerning and has not responded to high doses of propofol +benzodiazepine, she has had issues with severe hypotension and bradycardia despite pressors and only 10 mg of Versed .  With issues with BP with medications like propofol , I think that pentobarbital would not be tolerated.  We have therefore tried ketamine , but the discharges remain refractory.   Recommendations  Depacon  500 3 times daily Keppra  500 twice daily(renally dosed) Continue midazolam  10 mg/h MRI brain Neurology will continue to follow ______________________________________________________________________  This patient is critically ill and at significant risk of neurological worsening, death and care requires constant monitoring of vital signs, hemodynamics,respiratory and cardiac monitoring, neurological assessment, discussion with family, other specialists and medical decision making of high complexity. I spent 42 minutes of neurocritical care time  in the care of  this patient. This was time spent independent of any time provided by nurse practitioner or PA.  Aisha Michaela, MD [facility]   If 7pm- 7am, please page neurology on call as listed in AMION. 09/17/2023  12:11 PM

## 2023-09-17 NOTE — Procedures (Signed)
 Patient Name: Judy Chang  MRN: [id number]  Epilepsy Attending: Arlin MALVA Krebs  Referring Physician/Provider: Judithe Rocky BROCKS, NP  Duration: 09/16/2023 1810 to 09/17/2023 2200   Patient history: 73yo F s/p cardiac arrest. EEG to evaluate for seizure   Level of alertness:  comatose   AEDs during EEG study: Propofol , Versed , Ketamine , LEV, VPA   Technical aspects: This EEG study was done with scalp electrodes positioned according to the 10-20 International system of electrode placement. Electrical activity was reviewed with band pass filter of 1-70Hz , sensitivity of 7 uV/mm, display speed of 33mm/sec with a 60Hz  notched filter applied as appropriate. EEG data were recorded continuously and digitally stored.  Video monitoring was available and reviewed as appropriate.   Description: EEG showed burst suppression pattern with highly epileptiform bursts lasting 0.5 seconds alternate with 0.5 to 1 seconds of generalized EEG suppression. Hyperventilation and photic stimulation were not performed.     EEG was disconnected between 09/17/2023 1420 to 1803 for MRI brain   ABNORMALITY - Burst suppression with highly epileptiform bursts, generalized   IMPRESSION: This study showed evidence of epileptogenicity with generalized onset. This EEG pattern is on the ictal-interictal continuum with high suspicion for ictal nature. Additionally there was evidence of severe to profound diffuse encephalopathy. In the setting of cardiac arrest, this EEG pattern is suggestive of anoxic/hypoxic brain injury.   [name] O Casie Sturgeon

## 2023-09-17 NOTE — Progress Notes (Signed)
 eLink Physician-Brief Progress Note Patient Name: Judy Chang DOB: 12-23-1950 MRN: [id number]   Date of Service  09/17/2023  HPI/Events of Note  72 y/o ESRD on HD with PEA arrest, likely anoxic injury  Potassium 2.8, phosphorus less than 1, magnesium  1.7  eICU Interventions  Sodium Phos, magnesium  sulfate ordered     Intervention Category Minor Interventions: Electrolytes abnormality - evaluation and management  Keali Mccraw 09/17/2023, 5:07 AM

## 2023-09-17 NOTE — Progress Notes (Signed)
 Pt was transported to MRI via ventilator and back with no apparent complication.

## 2023-09-18 ENCOUNTER — Inpatient Hospital Stay (HOSPITAL_COMMUNITY)

## 2023-09-18 DIAGNOSIS — R579 Shock, unspecified: Secondary | ICD-10-CM | POA: Diagnosis not present

## 2023-09-18 DIAGNOSIS — R569 Unspecified convulsions: Secondary | ICD-10-CM | POA: Diagnosis not present

## 2023-09-18 DIAGNOSIS — G253 Myoclonus: Secondary | ICD-10-CM | POA: Diagnosis not present

## 2023-09-18 DIAGNOSIS — G931 Anoxic brain damage, not elsewhere classified: Secondary | ICD-10-CM

## 2023-09-18 DIAGNOSIS — I469 Cardiac arrest, cause unspecified: Secondary | ICD-10-CM | POA: Diagnosis not present

## 2023-09-18 LAB — BASIC METABOLIC PANEL WITH GFR
Anion gap: 12 (ref 5–15)
Anion gap: 13 (ref 5–15)
BUN: 43 mg/dL — ABNORMAL HIGH (ref 8–23)
BUN: 56 mg/dL — ABNORMAL HIGH (ref 8–23)
CO2: 20 mmol/L — ABNORMAL LOW (ref 22–32)
CO2: 18 mmol/L — ABNORMAL LOW (ref 22–32)
Calcium: 7.9 mg/dL — ABNORMAL LOW (ref 8.9–10.3)
Calcium: 8 mg/dL — ABNORMAL LOW (ref 8.9–10.3)
Chloride: 96 mmol/L — ABNORMAL LOW (ref 98–111)
Chloride: 96 mmol/L — ABNORMAL LOW (ref 98–111)
Creatinine, Ser: 4.63 mg/dL — ABNORMAL HIGH (ref 0.44–1.00)
Creatinine, Ser: 5.44 mg/dL — ABNORMAL HIGH (ref 0.44–1.00)
GFR, Estimated: 9 mL/min — ABNORMAL LOW (ref >60)
GFR, Estimated: 8 mL/min — ABNORMAL LOW (ref >60)
Glucose, Bld: 129 mg/dL — ABNORMAL HIGH (ref 70–99)
Glucose, Bld: 122 mg/dL — ABNORMAL HIGH (ref 70–99)
Potassium: 5.6 mmol/L — ABNORMAL HIGH (ref 3.5–5.1)
Potassium: 5.7 mmol/L — ABNORMAL HIGH (ref 3.5–5.1)
Sodium: 128 mmol/L — ABNORMAL LOW (ref 135–145)
Sodium: 127 mmol/L — ABNORMAL LOW (ref 135–145)

## 2023-09-18 LAB — GLUCOSE, CAPILLARY
Glucose-Capillary: 137 mg/dL — ABNORMAL HIGH (ref 70–99)
Glucose-Capillary: 127 mg/dL — ABNORMAL HIGH (ref 70–99)
Glucose-Capillary: 95 mg/dL (ref 70–99)
Glucose-Capillary: 112 mg/dL — ABNORMAL HIGH (ref 70–99)
Glucose-Capillary: 111 mg/dL — ABNORMAL HIGH (ref 70–99)
Glucose-Capillary: 169 mg/dL — ABNORMAL HIGH (ref 70–99)

## 2023-09-18 LAB — PHOSPHORUS
Phosphorus: 5.1 mg/dL — ABNORMAL HIGH (ref 2.5–4.6)
Phosphorus: 5.3 mg/dL — ABNORMAL HIGH (ref 2.5–4.6)

## 2023-09-18 MED ORDER — SODIUM ZIRCONIUM CYCLOSILICATE 10 G PO PACK
10.0000 g | PACK | Freq: Once | ORAL | Status: AC
  Administered 2023-09-18: 10 g
  Filled 2023-09-18: qty 1

## 2023-09-18 NOTE — Progress Notes (Signed)
 NAME:  Judy Chang, MRN:  [id number], DOB:  02-May-1950, LOS: 4 ADMISSION DATE:  09/14/2023, CONSULTATION DATE:  09/14/2023 REFERRING MD:  Lonni Seats, MD, CHIEF COMPLAINT:  Cardiac Arrest  History of Present Illness:   73 y/o female who has a PMH for HTN, ESRD on HD, S/p right tunneled dialysis catheter, DMT1, Anemia of chronic disease, Secondary Hyperparathyroidism, CVA with no residual weakness 1 year ago, OA, Chondromalacia, Spinal stenosis, DDD, chronic back pain who presented post PEA arrest.  History form chart and husband at bedside.  Patient apparently in her usual state of health.  She had no complaints per husband prior to going to bed last night.  She woke up in the middle of the night to go to the bathroom and she had labored breathing.  Husband woke up as well to check on her and she fell to the ground and he did not feel a pulse and he called 911.  Husband says he did perform CPR until EMS arrived.  EMS found patient in PEA arrest and did 19 min of CPR, receiving 4 doses of Epinephrine  IV pushes along with Calcium and Bicarb IV pushes.  She was intubated in the field by EMS.  In the ED she was placed on propofol  drip and her SBP in the 190s.  Her labs revealed a potassium of 7 initially and she was given Lokelma .  Repeat K was 4.0.  LA in ED 10.6, CT head and c-spine negative for fractures or acute issues.  Patient with c-collar in ED. She got dialysis yesterday and has not missed her dialysis except for once last week according to husband.  Pertinent  Medical History   HTN, ESRD on HD, S/p right tunneled dialysis catheter, DMT1, Anemia of chronic disease, Secondary Hyperparathyroidism, CVA with no residual weakness 1 year ago, OA, Chondromalacia, Spinal stenosis, DDD, chronic back pain  Significant Hospital Events: Including procedures, antibiotic start and stop dates in addition to other pertinent events   8/21: Admit to ICU. Early myoclonus 8/22 AED escalation 8/23   ketamine  added, unasyn  added  8/24 for MRI brain - looks ok 8/25 EEG remains flat with epileptiform discharges despite aggressive treatment concerning for anoxia  Interim History / Subjective:   Not improving w ketamine  addition   Vegetative state. Met with husband, sister, agree to comfort measures, awaiting visitors before removing support.   Objective    Blood pressure (!) 97/42, pulse 70, temperature (!) 100.6 F (38.1 C), resp. rate 20, height 5' (1.524 m), weight 52.2 kg, SpO2 100%.    Vent Mode: PRVC FiO2 (%):  [40 %] 40 % Set Rate:  [20 bmp] 20 bmp Vt Set:  [360 mL] 360 mL PEEP:  [5 cmH20] 5 cmH20 Plateau Pressure:  [14 cmH20-17 cmH20] 15 cmH20   Intake/Output Summary (Last 24 hours) at 09/18/2023 1431 Last data filed at 09/18/2023 0600 Gross per 24 hour  Intake 2140.59 ml  Output 300 ml  Net 1840.59 ml   Filed Weights   09/17/23 0130 09/17/23 0445 09/18/23 0445  Weight: 45.3 kg 43.5 kg 52.2 kg    Examination: Gen:  critically and chronically ill elderly F intubated sedated  HEENT:  NCAT ETT secure  Lungs:    Symmetrical chest expansion, tan resp secretions  CV:        bradycardic  Abd:    ndnt  GU:     foley  Ext: No obivous acute joint deformity  Neuro: unresponsive, breathes over vent, brainstem remains intact  but nothing else   Resolved problem list  HyperK Lactic acidosis  HTN emergency  Assessment and Plan   GOC DNR P -comfort measures, no escalation of care as of 8/25, plan withdrawal of care 07-Oct-2023 after time for visitation   PEA arrest Myoclonic sz Suspected anoxic brain injury Hx SAH P -MRI brain ok --EEG flat with epileptiform discharges -AEDs per neuro, even w addition of ket gtt there has not been meaningful eeg improvement.   Shock  -labile pressures but hypotensive, bradycardic -- could be medication related but I am suspicious of neuro dysregulation -had 1.8L off w HD and is not overloaded  P -midodrine  -cont titrating pressors  , SBP goal 90, continue NE  -labs not suggestive of sepsis but did start on unasyn  8/23 w thick yellow resp secretions   Acute resp failure w hypoxia  Possible aspiration PNA  P -cont MV support -unasyn  for possible aspiraiton   ESRD  Hypomag HypoK Hypophos Hyponatremia P -nephro following - HD per nephro  -replace lytes as needed  -BMP mag phos in AM    Best Practice (right click and Reselect all SmartList Selections daily)   Diet/type: NPO DVT prophylaxis prophylactic heparin   Pressure ulcer(s): N/A GI prophylaxis: H2B Lines: N/A (perm cath)  Foley:  Yes, and it is still needed Code Status:  DNR  Critical care time:   CRITICAL CARE Performed by: Donnice JONELLE Beals  Total critical care time: 35 minutes   Critical care time was exclusive of separately billable procedures and treating other patients. Critical care was necessary to treat or prevent imminent or life-threatening deterioration.  Critical care was time spent personally by me on the following activities: development of treatment plan with patient and/or surrogate as well as nursing, discussions with consultants, evaluation of patient's response to treatment, examination of patient, obtaining history from patient or surrogate, ordering and performing treatments and interventions, ordering and review of laboratory studies, ordering and review of radiographic studies, pulse oximetry and re-evaluation of patient's condition.  Donnice JONELLE Beals, MD [facility] Pulmonary/Critical Care Medicine Amion for contact info 09/18/2023, 2:31 PM

## 2023-09-18 NOTE — Procedures (Addendum)
 Patient Name: UNNAMED HINO  MRN: [id number]  Epilepsy Attending: Arlin MALVA Krebs  Referring Physician/Provider: Judithe Rocky BROCKS, NP  Duration: 09/17/2023 2200 to 09/18/2023 1935   Patient history: 73yo F s/p cardiac arrest. EEG to evaluate for seizure   Level of alertness:  comatose   AEDs during EEG study:  LEV, LCM, VPA, Versed , Ketamine    Technical aspects: This EEG study was done with scalp electrodes positioned according to the 10-20 International system of electrode placement. Electrical activity was reviewed with band pass filter of 1-70Hz , sensitivity of 7 uV/mm, display speed of 43mm/sec with a 60Hz  notched filter applied as appropriate. EEG data were recorded continuously and digitally stored.  Video monitoring was available and reviewed as appropriate.   Description: EEG showed burst suppression pattern with highly epileptiform bursts lasting 0.5-1 seconds alternate with 10-15 seconds of generalized EEG suppression. Hyperventilation and photic stimulation were not performed.     ABNORMALITY - Burst suppression with highly epileptiform bursts, generalized   IMPRESSION: This study showed evidence of epileptogenicity with generalized onset. This EEG pattern is on the ictal-interictal continuum with high suspicion for ictal nature. Additionally there was evidence of severe to profound diffuse encephalopathy. In the setting of cardiac arrest, this EEG pattern is suggestive of anoxic/hypoxic brain injury.   Selma Mink O Quaniyah Bugh

## 2023-09-18 NOTE — Progress Notes (Signed)
 LTM maint complete - no skin breakdown under:  C4, P4.

## 2023-09-18 NOTE — Progress Notes (Signed)
 K 5.6 today. Ongoing GOC. HD plan to tomorrow. D/w CCM, ok to give Lokelma  10g x1  Sergio Batch, PharmD, [place], AAHIVP, CPP Infectious Disease Pharmacist 09/18/2023 8:41 AM

## 2023-09-18 NOTE — Progress Notes (Signed)
 Attending Neurohospitalist Addendum Patient seen and examined with APP/Resident. Agree with the history and physical as documented above. Agree with the plan as documented, which I helped formulate. I have edited the note above to reflect my full findings and recommendations. I have independently reviewed the chart, obtained history, review of systems and examined the patient.I have personally reviewed pertinent head/neck/spine imaging (CT/MRI). Please feel free to call with any questions.  This evening family decided to transition patient to comfort care tomorrow after they are able to say their goodbyes. We will unhook from cEEG and s/o, however please reach out if we can be of further assistance in any way.  -- Judy Ross, MD [facility] [phone]  If 7pm- 7am, please page neurology on call as listed in AMION.     NEUROLOGY CONSULT FOLLOW UP NOTE   Date of service: September 18, 2023 Patient Name: Judy Chang MRN:  [id number] DOB:  August 17, 1950  Interval Hx/subjective   EEG read this morning continues to shows generalized bursts, indicative of anoxic brain injury.   Vitals   Vitals:   09/18/23 0600 09/18/23 0615 09/18/23 0733 09/18/23 1119  BP: (!) 108/45 (!) 127/48    Pulse: 64 66 67 71  Resp: 19 17 (!) 21 (!) 21  Temp: 98.8 F (37.1 C) 99 F (37.2 C) 99.9 F (37.7 C) (!) 100.6 F (38.1 C)  TempSrc:      SpO2: 100% 100% 100% 100%  Weight:      Height:         Body mass index is 22.48 kg/Chang.  Physical Exam   Constitutional: Appears well-developed and well-nourished.   Neurologic Examination    MS: Does not open eyes or follow commands CN: Pupils are non-reactive. No cough, gag or corneal reflexes.  Motor/Sensory: No spontaneous movement, no withdraw movement to noxious stimulation   Medications  Current Facility-Administered Medications:    ampicillin -sulbactam (UNASYN ) 1.5 g in sodium chloride  0.9 % 100 mL IVPB, 1.5 g,  Intravenous, Q12H, Chang, Judy B, RPH, Stopped at 09/18/23 0131   Chlorhexidine  Gluconate Cloth 2 % PADS 6 each, 6 each, Topical, Daily, Mannam, Praveen, MD, 6 each at 09/17/23 0844   famotidine  (PEPCID ) tablet 10 mg, 10 mg, Per Tube, Daily, Judy Harder, MD, 10 mg at 09/18/23 1044   feeding supplement (OSMOLITE 1.2 CAL) liquid 1,000 mL, 1,000 mL, Per Tube, Q24H, Mannam, Praveen, MD, Infusion Verify at 09/18/23 0600   feeding supplement (PROSource TF20) liquid 60 mL, 60 mL, Per Tube, Daily, Mannam, Praveen, MD, 60 mL at 09/18/23 1044   heparin  injection 1,000 Units, 1,000 Units, Intracatheter, PRN, Judy Charleston, MD, 3,200 Units at 09/17/23 0421   heparin  injection 5,000 Units, 5,000 Units, Subcutaneous, Q8H, Judy Harder, MD, 5,000 Units at 09/18/23 0550   ipratropium-albuterol  (DUONEB) 0.5-2.5 (3) MG/3ML nebulizer solution 3 mL, 3 mL, Nebulization, Q4H PRN, Chang, Judy V, MD   ketamine  (KETALAR ) adult IV infusion (10 mg/mL), 4 mg/kg/hr, Intravenous, Continuous, Judy Chang, RPH, Last Rate: 17.4 mL/hr at 09/18/23 0749, 4 mg/kg/hr at 09/18/23 0749   lacosamide  (VIMPAT ) 200 mg in sodium chloride  0.9 % 25 mL IVPB, 200 mg, Intravenous, Q12H, Judy Aisha SQUIBB, MD, Last Rate: 90 mL/hr at 09/18/23 1040, 200 mg at 09/18/23 1040   levETIRAcetam  (KEPPRA ) undiluted injection 500 mg, 500 mg, Intravenous, Q12H, Judy Aisha SQUIBB, MD, 500 mg at 09/18/23 0445   midazolam  (VERSED ) 100 mg/100 mL (1 mg/mL) premix infusion, 5 mg/hr, Intravenous, Continuous, Judy Aisha SQUIBB, MD, Last Rate:  5 mL/hr at 09/18/23 0600, 5 mg/hr at 09/18/23 0600   midazolam  (VERSED ) bolus via infusion 2 mg, 2 mg, Intravenous, Q1H PRN, Mannam, Praveen, MD, 2 mg at 09/16/23 0302   midodrine  (PROAMATINE ) tablet 10 mg, 10 mg, Per Tube, TID WC, Chang, Judy BRAVO, NP, 10 mg at 09/18/23 1045   norepinephrine  (LEVOPHED ) 4mg  in (0.016 mg/mL) premix infusion, 0-10 mcg/min, Intravenous, Titrated, Chang, Judy SAUNDERS,  MD, Last Rate: 37.5 mL/hr at 09/18/23 0630, 10 mcg/min at 09/18/23 0630   Oral care mouth rinse, 15 mL, Mouth Rinse, Q2H, Mannam, Praveen, MD, 15 mL at 09/18/23 1037   Oral care mouth rinse, 15 mL, Mouth Rinse, PRN, Mannam, Praveen, MD   valproate (DEPACON ) 500 mg in dextrose  5 % 50 mL IVPB, 500 mg, Intravenous, Q8H, Judy Aisha SQUIBB, MD, Last Rate: 55 mL/hr at 09/18/23 0600, Infusion Verify at 09/18/23 0600  Labs and Diagnostic Imaging   Imaging(Personally reviewed): Initial head CT is negative  Assessment   Judy Chang is a 73 y.o. female status post cardiac arrest with concern for postarrest seizure.  She does have a highly concerning pattern on her EEG, and continued generalized periodic discharges are correlated with poor prognosis.    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  She has not responded to high doses of propofol +benzodiazepine, she has had issues with severe hypotension and bradycardia despite pressors and only 10 mg of Versed .    Her EEG read this morning shows burst suppression with highly epileptiform bursts, despite the addition of ketamine  yesterday. She continues to require pressors,currently on levophed  at 37.5 ml/hr. Given continued hypotension, doubt that she would be able to tolerate the addition of further medications such as pentobarbital.   On today's exam, she has no cough, gag or corneal reflexes. Wile she is on multiple sedating medications that could affect her neurological exam, her EEG and MRI both indicate anoxic brain injury.   Recommendations   Continue Depacon  500 Q8H Continue Keppra  500 BID (renally dosed) Continue Vimpat  200mg  Q12H Continue midazolam  10 mg/hr Continue Ketamine  4mg /kg/hr  We will continue to follow and available for further goals of care discussions as needed.  ______________________________________________________________________   Pt seen by Neuro  NP/APP and later by MD. Note/plan to be edited by MD as needed.    Judy JAYSON Likes, DNP, AGACNP-BC [facility] Please use AMION for contact information & EPIC for messaging.

## 2023-09-18 NOTE — Progress Notes (Incomplete)
 Nutrition Follow-up  DOCUMENTATION CODES:   Not applicable  INTERVENTION:  ***   NUTRITION DIAGNOSIS:   Inadequate oral intake related to acute illness as evidenced by NPO status.  ***  GOAL:   Patient will meet greater than or equal to 90% of their needs  ***  MONITOR:   Vent status, Labs, TF tolerance, Weight trends, I & O's  REASON FOR ASSESSMENT:   Ventilator    ASSESSMENT:   Pt admitted post PEA arrest. PMH significant for HTN, ESRD on HD, T1DM, anemia of chronic disease, secondary hyperparathyroidism, CVA, chondromalacia.  ***   NUTRITION - FOCUSED PHYSICAL EXAM:  {RD Focused Exam List:21252}  Diet Order:   Diet Order             Diet NPO time specified  Diet effective now                   EDUCATION NEEDS:   No education needs have been identified at this time  Skin:  Skin Assessment: Reviewed RN Assessment (mid sacrum skin tear)  Last BM:  unknown/PTA  Height:   Ht Readings from Last 1 Encounters:  09/14/23 5' (1.524 m)    Weight:   Wt Readings from Last 1 Encounters:  09/18/23 52.2 kg    Ideal Body Weight:     BMI:  Body mass index is 22.48 kg/m.  Estimated Nutritional Needs:   Kcal:  1200-1400  Protein:  60-75g  Fluid:  1.5L    ***

## 2023-09-18 NOTE — TOC Progression Note (Signed)
 Transition of Care [facility]) - Progression Note    Patient Details  Name: Judy Chang MRN: [id number] Date of Birth: 02-25-1950  [facility] CM/SW Contact  Tom-Johnson, Colby Reels Daphne, RN Phone Number: 09/18/2023, 1:45 PM  Clinical Narrative:     Patient continues to be intubated and sedated.   Patient not Medically ready for discharge.  CM will continue to follow as patient progresses with care towards discharge.                      Expected Discharge Plan and Services                                               Social Drivers of Health (SDOH) Interventions SDOH Screenings   Food Insecurity: Patient Unable To Answer (09/18/2023)  Housing: Patient Unable To Answer (09/18/2023)  Transportation Needs: Patient Unable To Answer (09/18/2023)  Utilities: Patient Unable To Answer (09/18/2023)  Social Connections: Unknown (09/18/2023)  Tobacco Use: Medium Risk (09/14/2023)    Readmission Risk Interventions     No data to display

## 2023-09-18 NOTE — Progress Notes (Signed)
 LTM VIDEO EEG discontinued - upper forehead skin breakdown at [facility] Nurse notified.  [facility] notified to discontinue monitoring.

## 2023-09-18 NOTE — IPAL (Signed)
  Interdisciplinary Goals of Care Family Meeting   Date carried out: 09/18/2023  Location of the meeting: Bedside  Member's involved: Physician, Bedside Registered Nurse, and Family Member or next of kin  Durable Power of Attorney or acting medical decision maker: husband    Discussion: We discussed goals of care for [name] .  Severe anoxic brain injury clinically.  Likely of recovery is nearly 0.  This has been discussed at length and husband says this is similar to prior discussions.  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  Husband states that she would not want to suffer.  She has no desire to live this way.  He does not want to prolong any discomfort.  We discussed comfort measures in details.  He agrees to this.  As a sister.  Will pursue comfort measures once time allowed for loved ones family members others to visit if desired.  Code status:   Code Status: Do not attempt resuscitation (DNR) PRE-ARREST INTERVENTIONS DESIRED   Disposition: In-patient comfort care  Time spent for the meeting: 10 minutes    Donnice JONELLE Beals, MD  09/18/2023, 3:08 PM

## 2023-09-18 NOTE — Progress Notes (Signed)
 [facility] Progress Note   OP HD: [facility] 3.5h   B350  41kg  2K bath  TDC   Heparin  2000 Last HD 8/19, post wt 43.2kg  Usually gets to dry wt        Assessment/ Plan: S/P cardiac arrest: at home, s/p 19 min of CPR til ROSC.  Seen by neurology currently undergoing EEG evidence of epileptogenicity as well as severe to profound diffuse encephalopathy.  Suggestive of anoxic/hypoxic brain injury ESRD: on HD [facility]. Had HD here 8/21 and 8/23. Next HD 2023-10-19 pending discussions with family and will hold on writing orders for now Plan for transition to comfort care reasonable in setting of anoxic brain injury. Signing off at this time; please reconsult as needed.  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   Subjective:  Seen in ICU Multiple gtt's including ketamine , versed , levo (2mcg) Also getting piggyback IV valproic  acid, keppra , lacosamide  Cousin bedside updated  Vitals:   09/18/23 1000 09/18/23 1100 09/18/23 1119 09/18/23 1200  BP: (!) 121/48 (!) 109/45  (!) 97/42  Pulse: 67 70 71 70  Resp: (!) 23 19 (!) 21 20  Temp: (!) 100.4 F (38 C) (!) 100.6 F (38.1 C) (!) 100.6 F (38.1 C) (!) 100.6 F (38.1 C)  TempSrc:      SpO2: 100% 99% 100% 100%  Weight:      Height:        Exam: Gen on vent, not responsive Sclera anicteric, throat w/ ETT No jvd, neck veins are flat Chest clear anterior/ lateral RRR no MRG Abd soft ntnd no mass or ascites +bs Ext no LE edema, no wounds or ulcers Neuro is on vent, sedated    RIJ TDC   Home bp meds: Norvasc  5 every day (not taking) Coreg  12.5 bid Clonidine  0.2mg  patch weekly Aldactone 25 mg every day  CXR 8/21: no edema     Recent Labs  Lab 09/14/23 0450 09/14/23 0553 09/16/23 1200 09/17/23 0337 09/17/23 1740 09/18/23 0423  HGB  --    < > 8.9* 10.1*  --   --   ALBUMIN  2.8*  --  2.8*  --   --   --    CALCIUM 9.0   < > 8.0* QUESTIONABLE RESULTS, RECOMMEND RECOLLECT TO VERIFY 7.9* 7.9*  PHOS  --    < >  --  QUESTIONABLE RESULTS, RECOMMEND RECOLLECT TO VERIFY 5.8* 5.1*  CREATININE 6.97*   < > 6.22* QUESTIONABLE RESULTS, RECOMMEND RECOLLECT TO VERIFY 4.17* 4.63*  K 4.0   < > 4.2 QUESTIONABLE RESULTS, RECOMMEND RECOLLECT TO VERIFY 5.0 5.6*   < > = values in this interval not displayed.   No results for input(s): IRON, TIBC, FERRITIN in the last 168 hours. Inpatient medications:  Chlorhexidine  Gluconate Cloth  6 each Topical Daily   famotidine   10 mg Per Tube Daily   feeding supplement (OSMOLITE 1.2 CAL)  1,000 mL Per Tube Q24H   feeding supplement (PROSource TF20)  60 mL Per Tube Daily   heparin   5,000 Units Subcutaneous Q8H   levETIRAcetam   500 mg Intravenous Q12H   midodrine   10 mg Per Tube TID WC   mouth rinse  15 mL Mouth Rinse Q2H    ampicillin -sulbactam (UNASYN ) IV Stopped (09/18/23 0131)   ketamine  (KETALAR ) adult infusion 4 mg/kg/hr (09/18/23 0749)   lacosamide  (VIMPAT ) IV 200  mg (09/18/23 1040)   midazolam  5 mg/hr (09/18/23 0600)   norepinephrine  (LEVOPHED ) Adult infusion 10 mcg/min (09/18/23 0630)   valproate sodium  55 mL/hr at 09/18/23 0600   heparin , ipratropium-albuterol , midazolam , mouth rinse

## 2023-09-19 ENCOUNTER — Inpatient Hospital Stay (HOSPITAL_COMMUNITY)

## 2023-09-19 DIAGNOSIS — R403 Persistent vegetative state: Secondary | ICD-10-CM

## 2023-09-19 DIAGNOSIS — Z992 Dependence on renal dialysis: Secondary | ICD-10-CM

## 2023-09-19 DIAGNOSIS — R578 Other shock: Secondary | ICD-10-CM

## 2023-09-19 DIAGNOSIS — G931 Anoxic brain damage, not elsewhere classified: Secondary | ICD-10-CM

## 2023-09-19 DIAGNOSIS — Z515 Encounter for palliative care: Secondary | ICD-10-CM

## 2023-09-19 DIAGNOSIS — Z66 Do not resuscitate: Secondary | ICD-10-CM

## 2023-09-19 LAB — GLUCOSE, CAPILLARY
Glucose-Capillary: 193 mg/dL — ABNORMAL HIGH (ref 70–99)
Glucose-Capillary: 127 mg/dL — ABNORMAL HIGH (ref 70–99)

## 2023-09-24 DIAGNOSIS — N186 End stage renal disease: Secondary | ICD-10-CM | POA: Diagnosis not present

## 2023-09-24 DIAGNOSIS — E1022 Type 1 diabetes mellitus with diabetic chronic kidney disease: Secondary | ICD-10-CM | POA: Diagnosis not present

## 2023-09-24 DIAGNOSIS — Z992 Dependence on renal dialysis: Secondary | ICD-10-CM | POA: Diagnosis not present

## 2023-09-25 NOTE — Progress Notes (Addendum)
 Pt's time of death: 10-18-2023 @ 1100   Auscultated heart and lung sound x2 min along w/Edwin M, RN ... No lungs or heart sounds auscultated.    No visible chest rise seen   Notified Dr. Donnice SAUNDERS. [facility] and [facility]  Printed asystole strip and placed in chart

## 2023-09-25 NOTE — Progress Notes (Addendum)
 Arrived for EEG maint  Patient expired today

## 2023-09-25 NOTE — Progress Notes (Signed)
 Contacted [facility] to be advised of pt's passing.   Randine Mungo Dialysis Navigator [phone]

## 2023-09-25 NOTE — Progress Notes (Signed)
Per CCM order, RT extubated pt to comfort care/room air.

## 2023-09-25 NOTE — Progress Notes (Addendum)
 [facility] Physician-Brief Progress Note Patient Name: Judy Chang DOB: Apr 01, 1950 MRN: [id number]   Date of Service  12-Oct-2023  HPI/Events of Note  Concerned that the patient had an aspiration of tube feeds.  No change in respiratory status, no change in hemodynamics, remains sedated with stable vasopressor requirements  eICU Interventions  Anticipating transition to comfort measures today.  Given no change in hemodynamics or respiratory status, no further intervention is indicated.  Tube feeds are held.   9365 - persistent hypotension, increase ceiling of periphreal levo to 20mcg  Intervention Category Intermediate Interventions: Hypotension - evaluation and management;Respiratory distress - evaluation and management  Jarnell Cordaro 10/12/2023, 5:10 AM

## 2023-09-25 NOTE — Death Summary Note (Signed)
 DEATH SUMMARY   Patient Details  Name: Judy Chang MRN: [id number] DOB: Jun 21, 1950  Admission/Discharge Information   Admit Date:  09/29/2023  Date of Death: Date of Death: 2023-10-04  Time of Death: Time of Death: 1100  Length of Stay: 5  Referring Physician: Arloa Elsie SAUNDERS, MD   Reason(s) for Hospitalization  Cardiac arrest  Diagnoses  Preliminary cause of death: anoxic brain injury Secondary Diagnoses (including complications and co-morbidities):  Principal Problem:   Cardiac arrest [facility]) Active Problems:   Essential hypertension   Pulmonary hypertension (HCC)   GAD (generalized anxiety disorder)   Anoxic brain injury (HCC)   Vegetative state (HCC)   DNR (do not resuscitate)   ESRD (end stage renal disease) on dialysis (HCC)   Comfort measures only status   Neurogenic shock [facility])   Brief Hospital Course (including significant findings, care, treatment, and services provided and events leading to death)  Judy Chang is a 73 y.o. year old female history of ESRD on HD who was admitted to the ICU following cardiac arrest.  She was intubated, vent dependent.  Intermittent hypotension likely for the be of neurogenic in nature.  She had horrible, poor neurologic exam.  Brainstem intact did not respond to noxious stimuli, was in a vegetative state.  EEG was flat with epileptiform bursts concerning for myoclonic bursts.  This did not improve despite multiple AEDs including up to ketamine .  MRI brain was unremarkable otherwise.  Given no neurologic improvement over the course of several days, after goals of care discussion, family elected to make the patient comfort measures.  She was previously DNR.  Time was allowed for family to gather.  Unfortunately, there is clinical decompensation with worsening hypoxemia and hypotension.  The patient passed on full support prior to terminal extubation.  Family was present at that time.    Pertinent Labs and Studies  Significant  Diagnostic Studies MR BRAIN WO CONTRAST Addendum Date: October 04, 2023  ADDENDUM #2  On further review, there is increased T2 weighted signal intensity within the lentiform nuclei and thalami. There is hyperintensity on diffusion-weighted imaging without adc correlate, consistent with T2 shine through effect. The central and symmetric pattern of signal abnormality is a common manifestation of toxic/metabolic disorders, but also may be seen in adult hypoxic-ischemic injury. Electronically signed by: Franky Stanford MD 2023-10-04 11:48 AM EDT RP Workstation: [id number]   Addendum Date: 04-Oct-2023 ADDENDUM #1  On further review, there is increased T2 weighted signal intensity within the lentiform nuclei and thalami. There is hyperintensity on diffusion-weighted imaging without adc correlate, consistent with T2 shine through effect. The central and symmetric pattern of signal abnormality is a common manifestation of toxic/metabolic disorders, but also may be seen in adult hypoxic-ischemic injury. Electronically signed by: Franky Stanford MD 10/04/23 11:47 AM EDT RP Workstation: [id number]   Result Date: 10/04/2023  ORIGINAL REPORT EXAM: MRI BRAIN WITHOUT CONTRAST 09/17/2023 04:20:44 PM TECHNIQUE: Multiplanar multisequence MRI of the head/brain was performed without the administration of intravenous contrast. COMPARISON: 02/07/2019 CLINICAL HISTORY: Anoxic brain damage. FINDINGS: BRAIN AND VENTRICLES: No acute infarct. No intracranial hemorrhage. No mass. No midline shift. No hydrocephalus. The sella is unremarkable. Normal flow voids. Early confluent hyperintense T2-weighted signal within the cerebral white matter, most commonly due to chronic small vessel disease. Mild volume loss. ORBITS: No acute abnormality. SINUSES AND MASTOIDS: Right maxillary sinus opacification. Right mastoid fluid. BONES AND SOFT TISSUES: Normal marrow signal. No acute soft tissue abnormality. IMPRESSION: 1. No acute intracranial abnormality. 2.  Early  confluent hyperintense T2-weighted signal within the cerebral white matter, most commonly due to chronic small vessel disease. 3. Mild volume loss. Electronically signed by: Franky Stanford MD 09/17/2023 05:07 PM EDT RP Workstation: [id number]   CT HEAD WO CONTRAST ( ) Result Date: 09/16/2023 CLINICAL DATA:  Provided history: Anoxic brain damage. EXAM: CT HEAD WITHOUT CONTRAST TECHNIQUE: Contiguous axial images were obtained from the base of the skull through the vertex without intravenous contrast. RADIATION DOSE REDUCTION: This exam was performed according to the departmental dose-optimization program which includes automated exposure control, adjustment of the mA and/or kV according to patient size and/or use of iterative reconstruction technique. COMPARISON:  Head CT 09/14/2023. FINDINGS: Brain: Generalized cerebral atrophy. Patchy and ill-defined hypoattenuation within the cerebral white matter, nonspecific but compatible with mild chronic small vessel ischemic disease. There is no acute intracranial hemorrhage. No demarcated cortical infarct. Gray-white differentiation is preserved. No extra-axial fluid collection. No evidence of an intracranial mass. No midline shift. Vascular: No hyperdense vessel.  Atherosclerotic calcifications. Skull: No calvarial fracture or aggressive osseous lesion. Sinuses/Orbits: No mass or acute finding within the imaged orbits. Large fluid level within the right maxillary sinus. Large fluid level within the right sphenoid sinus. Moderate-sized fluid level within the left sphenoid sinus. Mild-to-moderate opacification of right ethmoid air cells. IMPRESSION: 1. No evidence of an acute intracranial abnormality. Please note, a brain MRI would have greater sensitivity for acute hypoxic/ischemic injury. 2. Parenchymal atrophy and chronic small vessel ischemic disease. 3. Paranasal sinus disease at the imaged levels, as described. Electronically Signed   By: Rockey Childs D.O.    On: 09/16/2023 10:57   Overnight EEG with video Result Date: 09/15/2023 Shelton Arlin KIDD, MD     09/16/2023  6:59 AM Patient Name: MKENZIE DOTTS MRN: [id number] Epilepsy Attending: Arlin KIDD Shelton Referring Physician/Provider: Judithe Rocky BROCKS, NP Duration: 09/14/2023 1810 to 09/15/2023 1810 Patient history: 73yo F s/p cardiac arrest. EEG to evaluate for seizure Level of alertness:  comatose AEDs during EEG study: Propofol  Technical aspects: This EEG study was done with scalp electrodes positioned according to the 10-20 International system of electrode placement. Electrical activity was reviewed with band pass filter of 1-70Hz , sensitivity of 7 uV/mm, display speed of 73mm/sec with a 60Hz  notched filter applied as appropriate. EEG data were recorded continuously and digitally stored.  Video monitoring was available and reviewed as appropriate. Description: EEG showed burst suppression pattern with highly epileptiform bursts lasting 0.5 seconds alternate with 0.5 to 1 seconds of generalized EEG suppression.  Hyperventilation and photic stimulation were not performed.   Event button was pressed on 09/15/2023 at 0752.  Per RN patient had twitching of mouth and eyes.  Concomitant EEG showed generalized epileptiform bursts consistent with myoclonic seizure. ABNORMALITY - Burst suppression with highly epileptiform bursts, generalized IMPRESSION: This study showed evidence of epileptogenicity with generalized onset.  Given the frequency of burst, this EEG pattern is on the ictal-interictal continuum with high suspicion for ictal nature.  Additionally there was evidence of severe to profound diffuse encephalopathy.  One event was recorded on 09/13/2023 at 0752 during which patient had twitching of mouth and eyes.  Concomitant EEG showed generalized epileptiform bursts consistent with myoclonic seizure In the setting of cardiac arrest, this EEG pattern is suggestive of anoxic/hypoxic brain injury. Dr. Theophilus was notified.  Arlin KIDD Shelton   ECHOCARDIOGRAM COMPLETE Result Date: 09/14/2023    ECHOCARDIOGRAM REPORT   Patient Name:   KIMLA FURTH Date of Exam: 09/14/2023 Medical Rec #:  [id number]         Height:       60.0 in Accession #:    [id number]        Weight:       98.1 lb Date of Birth:  Apr 06, 1950         BSA:          1.379 m Patient Age:    73 years          BP:           156/75 mmHg Patient Gender: F                 HR:           60 bpm. Exam Location:  Inpatient Procedure: 2D Echo, Cardiac Doppler and Color Doppler (Both Spectral and Color            Flow Doppler were utilized during procedure). Indications:    Cardiac arrest  History:        Patient has prior history of Echocardiogram examinations, most                 recent 05/10/2021. Stroke and ESRD; Risk Factors:Hypertension.  Sonographer:    Therisa Crouch Referring Phys: [id number] [place]  Sonographer Comments: Low imaging windows. Vented/unable to position correctly IMPRESSIONS  1. Left ventricular ejection fraction, by estimation, is 50 to 55%. The left ventricle has low normal function. The left ventricle has no regional wall motion abnormalities. Left ventricular diastolic parameters are consistent with Grade I diastolic dysfunction (impaired relaxation).  2. Right ventricular systolic function is mildly reduced. The right ventricular size is normal.  3. The mitral valve is normal in structure. Trivial mitral valve regurgitation. No evidence of mitral stenosis.  4. The aortic valve is tricuspid. There is mild calcification of the aortic valve. Aortic valve regurgitation is not visualized. No aortic stenosis is present.  5. The inferior vena cava is normal in size with greater than 50% respiratory variability, suggesting right atrial pressure of 3 mmHg. FINDINGS  Left Ventricle: Left ventricular ejection fraction, by estimation, is 50 to 55%. The left ventricle has low normal function. The left ventricle has no regional wall motion abnormalities. The  left ventricular internal cavity size was normal in size. There is no left ventricular hypertrophy. Left ventricular diastolic parameters are consistent with Grade I diastolic dysfunction (impaired relaxation). Right Ventricle: The right ventricular size is normal. No increase in right ventricular wall thickness. Right ventricular systolic function is mildly reduced. Left Atrium: Left atrial size was normal in size. Right Atrium: Right atrial size was normal in size. Pericardium: There is no evidence of pericardial effusion. Mitral Valve: The mitral valve is normal in structure. Trivial mitral valve regurgitation. No evidence of mitral valve stenosis. Tricuspid Valve: The tricuspid valve is normal in structure. Tricuspid valve regurgitation is mild . No evidence of tricuspid stenosis. Aortic Valve: The aortic valve is tricuspid. There is mild calcification of the aortic valve. Aortic valve regurgitation is not visualized. No aortic stenosis is present. Pulmonic Valve: The pulmonic valve was normal in structure. Pulmonic valve regurgitation is not visualized. No evidence of pulmonic stenosis. Aorta: The aortic root is normal in size and structure. Venous: The inferior vena cava is normal in size with greater than 50% respiratory variability, suggesting right atrial pressure of 3 mmHg. IAS/Shunts: No atrial level shunt detected by color flow Doppler.  LEFT VENTRICLE PLAX 2D LVIDd:  3.65 cm     Diastology LVIDs:         2.70 cm     LV e' medial:    4.90 cm/s LV PW:         1.06 cm     LV E/e' medial:  9.6 LV IVS:        0.91 cm     LV e' lateral:   4.13 cm/s LVOT diam:     1.57 cm     LV E/e' lateral: 11.4 LVOT Area:     1.94 cm  LV Volumes (MOD) LV vol d, MOD A2C: 31.2 ml LV vol d, MOD A4C: 29.3 ml LV vol s, MOD A2C: 13.4 ml LV vol s, MOD A4C: 10.4 ml LV SV MOD A2C:     17.8 ml LV SV MOD A4C:     29.3 ml LV SV MOD BP:      18.3 ml RIGHT VENTRICLE             IVC RV S prime:     10.00 cm/s  IVC diam: 1.47 cm  TAPSE (M-mode): 1.6 cm LEFT ATRIUM             Index LA diam:        4.15 cm 3.01 cm/m LA Vol (A2C):   29.5 ml 21.39 ml/m LA Vol (A4C):   27.9 ml 20.23 ml/m LA Biplane Vol: 28.6 ml 20.74 ml/m   AORTA Ao Root diam: 2.60 cm Ao Asc diam:  2.57 cm MITRAL VALVE MV Area (PHT): 3.31 cm    SHUNTS MV Decel Time: 229 msec    Systemic Diam: 1.57 cm MV E velocity: 47.20 cm/s MV A velocity: 72.80 cm/s MV E/A ratio:  0.65 Toribio Fuel MD Electronically signed by Toribio Fuel MD Signature Date/Time: 09/14/2023/6:02:18 PM    Final    DG Abd Portable 1 View Result Date: 09/14/2023 CLINICAL DATA:  Check orogastric tube placement. EXAM: PORTABLE ABDOMEN - 1 VIEW COMPARISON:  Portable chest today at 4:11 a.m. FINDINGS: 4:56 a.m. Single portable supine view excludes the true pelvis. Enteric tube terminates in the body of stomach. There is mild gas distention of the stomach. The bowel pattern is nonobstructive with moderate retained stool. There is no supine evidence of free air or other significant radiographic findings. No urinary stone is seen through the stool artifacts. There is patchy aortoiliac calcific plaque. Degenerative change of the spine. IMPRESSION: 1. Enteric tube terminates in the body of the stomach. 2. Mild gas distention of the stomach. 3. Aortic atherosclerosis. Electronically Signed   By: Francis Quam M.D.   On: 09/14/2023 05:43   DG Chest Port 1 View Result Date: 09/14/2023 EXAM: 1 VIEW XRAY OF THE CHEST 09/14/2023 04:16:04 AM COMPARISON: AP radiograph of the chest dated 12/06/2002. CLINICAL HISTORY: Intubation, post CPR. FINDINGS: LUNGS AND PLEURA: Mildly prominent interstitial opacities within the lungs bilaterally. No focal pulmonary opacity. No pulmonary edema. No pleural effusion. No pneumothorax. HEART AND MEDIASTINUM: No acute abnormality of the cardiac and mediastinal silhouettes. BONES AND SOFT TISSUES: No acute osseous abnormality. LINES AND TUBES: Endotracheal tube is present with its  tip about 5 cm proximal to the carina. A right internal jugular hemodialysis catheter is also present with its tip near the superioratrial caval junction. IMPRESSION: 1. Mildly prominent interstitial opacities within the lungs bilaterally. 2. Endotracheal tube and right internal jugular hemodialysis catheter in appropriate position. Electronically signed by: Evalene Coho MD 09/14/2023 05:02 AM EDT RP Workstation: [id number]  CT CERVICAL SPINE WO CONTRAST Result Date: 09/14/2023 EXAM: CT CERVICAL SPINE WITHOUT CONTRAST 09/14/2023 04:31:34 AM TECHNIQUE: CT of the cervical spine was performed without the administration of intravenous contrast. Multiplanar reformatted images are provided for review. Automated exposure control, iterative reconstruction, and/or weight based adjustment of the mA/kV was utilized to reduce the radiation dose to as low as reasonably achievable. COMPARISON: CT of the cervical spine dated 09/16/2022. CLINICAL HISTORY: Neck trauma (Age >= 65y). Pt bibgcems from home witnessed arrest walking out of bathroom. EMS gave CPR for 19 minutes gave 4 epis, calcium, sodium bicarb. Pt intubated with ems. FINDINGS: CERVICAL SPINE: BONES AND ALIGNMENT: No acute fracture or traumatic malalignment. DEGENERATIVE CHANGES: Mild chronic degenerative disc disease at C3-4 with minimal uncovertebral joint hypertrophy. SOFT TISSUES: No prevertebral soft tissue swelling. An endotracheal tube and gastric tube are present. There is fluid within the thoracic esophagus. IMPRESSION: 1. No acute abnormality of the cervical spine related to the reported neck trauma. Electronically signed by: Evalene Coho MD 09/14/2023 05:00 AM EDT RP Workstation: [id number]   CT HEAD WO CONTRAST ( ) Result Date: 09/14/2023 EXAM: CT HEAD WITHOUT CONTRAST 09/14/2023 04:31:34 AM TECHNIQUE: CT of the head was performed without the administration of intravenous contrast. Automated exposure control, iterative reconstruction, and/or  weight based adjustment of the mA/kV was utilized to reduce the radiation dose to as low as reasonably achievable. COMPARISON: CT of the head dated 09/16/2022. CLINICAL HISTORY: Head trauma, minor (Age >= 65y). Pt bibgcems from home witnessed arrest walking out of bathroom. EMS gave CPR for 19 minutes gave 4 epis, calcium, sodium bicarb. Pt intubated with ems. FINDINGS: BRAIN AND VENTRICLES: No acute hemorrhage. Gray-white differentiation is preserved. No hydrocephalus. No extra-axial collection. No mass effect or midline shift. Various age-related atrophy and mild-to-moderate periventricular white matter disease. ORBITS: No acute abnormality. SINUSES: Moderate mucosal disease within the paranasal sinuses. SOFT TISSUES AND SKULL: No acute soft tissue abnormality. No skull fracture. The patient is status post bilateral injury placement. VASCULATURE: Calcifications within the carotid siphons. IMPRESSION: 1. No acute intracranial abnormality related to the minor head trauma. 2. Age-related atrophy and mild-to-moderate periventricular white matter disease. Electronically signed by: Evalene Coho MD 09/14/2023 04:58 AM EDT RP Workstation: [id number]    Microbiology Recent Results (from the past 240 hours)  MRSA Next Gen by PCR, Nasal     Status: Abnormal   Collection Time: 09/14/23  6:15 AM   Specimen: Nasal Mucosa; Nasal Swab  Result Value Ref Range Status   MRSA by PCR Next Gen (A) NOT DETECTED Final    INVALID, UNABLE TO DETERMINE THE PRESENCE OF TARGET DUE TO SPECIMEN INTEGRITY. RECOLLECTION REQUESTED.    Comment: RESULT CALLED TO, READ BACK BY AND VERIFIED WITH: RN [place], D [place] [id number] FCP        The GeneXpert MRSA Assay (FDA approved for NASAL specimens only), is one component of a comprehensive MRSA colonization surveillance program. It is not intended to diagnose MRSA infection nor to guide or monitor treatment for MRSA infections. Performed at [facility], [address] [place], [place] [address]   MRSA Next Gen by PCR, Nasal     Status: None   Collection Time: 09/14/23  8:42 PM   Specimen: Nasal Mucosa; Nasal Swab  Result Value Ref Range Status   MRSA by PCR Next Gen NOT DETECTED NOT DETECTED Final    Comment: (NOTE) The GeneXpert MRSA Assay (FDA approved for NASAL specimens only), is one component of a comprehensive MRSA  colonization surveillance program. It is not intended to diagnose MRSA infection nor to guide or monitor treatment for MRSA infections. Test performance is not FDA approved in patients less than 26 years old. Performed at [facility], [address], [place], [place] [address]     Lab Basic Metabolic Panel: Recent Labs  Lab 09/14/23 [phone] 09/14/23 0553 09/15/23 0420 09/15/23 1757 09/16/23 0912 09/16/23 1200 09/17/23 0337 09/17/23 1740 09/18/23 0423 09/18/23 1848  NA 142   < > 138  --   --  131* QUESTIONABLE RESULTS, RECOMMEND RECOLLECT TO VERIFY 129* 128* 127*  K 4.0   < > 3.1*  --   --  4.2 QUESTIONABLE RESULTS, RECOMMEND RECOLLECT TO VERIFY 5.0 5.6* 5.7*  CL 103  --  101  --   --  95* QUESTIONABLE RESULTS, RECOMMEND RECOLLECT TO VERIFY 97* 96* 96*  CO2 19*  --  26  --   --  21* QUESTIONABLE RESULTS, RECOMMEND RECOLLECT TO VERIFY 21* 20* 18*  GLUCOSE 159*  --  99  --   --  117* QUESTIONABLE RESULTS, RECOMMEND RECOLLECT TO VERIFY 93 129* 122*  BUN 22  --  37*  --   --  44* QUESTIONABLE RESULTS, RECOMMEND RECOLLECT TO VERIFY 33* 43* 56*  CREATININE 6.97*   < > 7.63*  --   --  6.22* QUESTIONABLE RESULTS, RECOMMEND RECOLLECT TO VERIFY 4.17* 4.63* 5.44*  CALCIUM 9.0  --  8.5*  --   --  8.0* QUESTIONABLE RESULTS, RECOMMEND RECOLLECT TO VERIFY 7.9* 7.9* 8.0*  MG 2.3  --  2.3  --   --   --  QUESTIONABLE RESULTS, RECOMMEND RECOLLECT TO VERIFY  --   --   --   PHOS  --   --  6.0*   < > 4.3  --  QUESTIONABLE RESULTS, RECOMMEND RECOLLECT TO VERIFY 5.8* 5.1* 5.3*   < > = values in this interval not displayed.   Liver Function  Tests: Recent Labs  Lab 09/14/23 0450 09/16/23 1200  AST 46* 26  ALT 22 12  ALKPHOS 99 64  BILITOT 0.7 0.6  PROT 6.2* 6.0*  ALBUMIN  2.8* 2.8*   No results for input(s): LIPASE, AMYLASE in the last 168 hours. No results for input(s): AMMONIA in the last 168 hours. CBC: Recent Labs  Lab 09/14/23 0401 09/14/23 0442 09/14/23 0553 09/14/23 0725 09/15/23 0455 09/16/23 1200 09/17/23 0337  WBC 13.8*  --   --  12.9* 9.6 8.1 7.0  NEUTROABS 6.2  --   --   --   --   --   --   HGB 11.5*   < > 12.2 12.7 10.9* 8.9* 10.1*  HCT 39.3   < > 36.0 42.0 33.6* 27.8* 31.5*  MCV 98.7  --   --  95.7 89.8 90.6 89.5  PLT 150  --   --  155 117* 143* 142*   < > = values in this interval not displayed.   Cardiac Enzymes: No results for input(s): CKTOTAL, CKMB, CKMBINDEX, TROPONINI in the last 168 hours. Sepsis Labs: Recent Labs  Lab 09/14/23 0725 09/14/23 0734 09/14/23 1440 09/15/23 0447 09/15/23 0455 09/16/23 1200 09/17/23 0337  WBC 12.9*  --   --   --  9.6 8.1 7.0  LATICACIDVEN  --  5.5* 1.2 1.0  --  2.2*  --     Procedures/Operations  As per EMR   Donnice SAUNDERS Shiesha Jahn 10/07/23, 12:52 PM

## 2023-09-25 DEATH — deceased

## 2023-10-31 ENCOUNTER — Other Ambulatory Visit (HOSPITAL_BASED_OUTPATIENT_CLINIC_OR_DEPARTMENT_OTHER): Payer: Self-pay
